# Patient Record
Sex: Female | Born: 1980 | Race: White | Hispanic: No | Marital: Married | Smoking: Current every day smoker
Health system: Southern US, Community
[De-identification: ages and names within clinical notes are randomized; demographics above are authoritative.]

## PROBLEM LIST (undated history)

## (undated) DIAGNOSIS — G932 Benign intracranial hypertension: Secondary | ICD-10-CM

## (undated) DIAGNOSIS — G8929 Other chronic pain: Secondary | ICD-10-CM

## (undated) DIAGNOSIS — E119 Type 2 diabetes mellitus without complications: Secondary | ICD-10-CM

## (undated) DIAGNOSIS — N939 Abnormal uterine and vaginal bleeding, unspecified: Secondary | ICD-10-CM

## (undated) DIAGNOSIS — N289 Disorder of kidney and ureter, unspecified: Secondary | ICD-10-CM

## (undated) HISTORY — PX: CHOLECYSTECTOMY: SHX55

## (undated) HISTORY — PX: ABDOMINAL HYSTERECTOMY: SHX81

---

## 2000-08-12 ENCOUNTER — Emergency Department (HOSPITAL_COMMUNITY): Admission: EM | Admit: 2000-08-12 | Discharge: 2000-08-12 | Payer: Self-pay

## 2000-08-12 ENCOUNTER — Encounter: Payer: Self-pay | Admitting: Emergency Medicine

## 2001-04-13 ENCOUNTER — Emergency Department (HOSPITAL_COMMUNITY): Admission: EM | Admit: 2001-04-13 | Discharge: 2001-04-13 | Payer: Self-pay | Admitting: Emergency Medicine

## 2010-08-21 ENCOUNTER — Emergency Department (HOSPITAL_BASED_OUTPATIENT_CLINIC_OR_DEPARTMENT_OTHER)
Admission: EM | Admit: 2010-08-21 | Discharge: 2010-08-21 | Payer: Self-pay | Source: Home / Self Care | Admitting: Emergency Medicine

## 2010-12-14 ENCOUNTER — Emergency Department (HOSPITAL_BASED_OUTPATIENT_CLINIC_OR_DEPARTMENT_OTHER)
Admission: EM | Admit: 2010-12-14 | Discharge: 2010-12-15 | Disposition: A | Discharge status: Home / Self Care | Payer: 59 | Attending: Emergency Medicine | Admitting: Emergency Medicine

## 2010-12-14 DIAGNOSIS — M25569 Pain in unspecified knee: Secondary | ICD-10-CM | POA: Insufficient documentation

## 2010-12-14 DIAGNOSIS — F17200 Nicotine dependence, unspecified, uncomplicated: Secondary | ICD-10-CM | POA: Insufficient documentation

## 2010-12-14 DIAGNOSIS — Y92009 Unspecified place in unspecified non-institutional (private) residence as the place of occurrence of the external cause: Secondary | ICD-10-CM | POA: Insufficient documentation

## 2010-12-14 DIAGNOSIS — W1809XA Striking against other object with subsequent fall, initial encounter: Secondary | ICD-10-CM | POA: Insufficient documentation

## 2010-12-14 DIAGNOSIS — S92919A Unspecified fracture of unspecified toe(s), initial encounter for closed fracture: Secondary | ICD-10-CM | POA: Insufficient documentation

## 2010-12-15 ENCOUNTER — Emergency Department (INDEPENDENT_AMBULATORY_CARE_PROVIDER_SITE_OTHER): Payer: 59

## 2010-12-15 DIAGNOSIS — W219XXA Striking against or struck by unspecified sports equipment, initial encounter: Secondary | ICD-10-CM

## 2010-12-15 DIAGNOSIS — M25569 Pain in unspecified knee: Secondary | ICD-10-CM

## 2010-12-15 DIAGNOSIS — M79609 Pain in unspecified limb: Secondary | ICD-10-CM

## 2010-12-15 DIAGNOSIS — W208XXA Other cause of strike by thrown, projected or falling object, initial encounter: Secondary | ICD-10-CM

## 2015-12-20 DIAGNOSIS — F329 Major depressive disorder, single episode, unspecified: Secondary | ICD-10-CM | POA: Diagnosis present

## 2015-12-20 DIAGNOSIS — F3181 Bipolar II disorder: Secondary | ICD-10-CM | POA: Diagnosis present

## 2018-05-28 ENCOUNTER — Emergency Department (HOSPITAL_BASED_OUTPATIENT_CLINIC_OR_DEPARTMENT_OTHER)
Admission: EM | Admit: 2018-05-28 | Discharge: 2018-05-29 | Disposition: A | Discharge status: Home / Self Care | Payer: Medicaid Other | Attending: Emergency Medicine | Admitting: Emergency Medicine

## 2018-05-28 ENCOUNTER — Encounter (HOSPITAL_BASED_OUTPATIENT_CLINIC_OR_DEPARTMENT_OTHER): Payer: Self-pay

## 2018-05-28 ENCOUNTER — Other Ambulatory Visit: Payer: Self-pay

## 2018-05-28 DIAGNOSIS — R110 Nausea: Secondary | ICD-10-CM | POA: Insufficient documentation

## 2018-05-28 DIAGNOSIS — F17200 Nicotine dependence, unspecified, uncomplicated: Secondary | ICD-10-CM | POA: Diagnosis not present

## 2018-05-28 DIAGNOSIS — Z7729 Contact with and (suspected ) exposure to other hazardous substances: Secondary | ICD-10-CM | POA: Insufficient documentation

## 2018-05-28 DIAGNOSIS — H538 Other visual disturbances: Secondary | ICD-10-CM | POA: Diagnosis not present

## 2018-05-28 DIAGNOSIS — R4182 Altered mental status, unspecified: Secondary | ICD-10-CM | POA: Diagnosis present

## 2018-05-28 DIAGNOSIS — R202 Paresthesia of skin: Secondary | ICD-10-CM | POA: Insufficient documentation

## 2018-05-28 LAB — RAPID URINE DRUG SCREEN, HOSP PERFORMED
AMPHETAMINES: NOT DETECTED
BENZODIAZEPINES: NOT DETECTED
Barbiturates: NOT DETECTED
COCAINE: NOT DETECTED
OPIATES: NOT DETECTED
TETRAHYDROCANNABINOL: NOT DETECTED

## 2018-05-28 LAB — URINALYSIS, ROUTINE W REFLEX MICROSCOPIC
Bilirubin Urine: NEGATIVE
Glucose, UA: NEGATIVE mg/dL
Ketones, ur: NEGATIVE mg/dL
LEUKOCYTES UA: NEGATIVE
Nitrite: NEGATIVE
PH: 7.5 (ref 5.0–8.0)
Protein, ur: NEGATIVE mg/dL
SPECIFIC GRAVITY, URINE: 1.015 (ref 1.005–1.030)

## 2018-05-28 LAB — URINALYSIS, MICROSCOPIC (REFLEX)

## 2018-05-28 LAB — PREGNANCY, URINE: Preg Test, Ur: NEGATIVE

## 2018-05-28 NOTE — ED Triage Notes (Signed)
Apparently pt touched zoanthid coral in their fish tank and did not wash her hands, ate some chips afterwards and possibly ingested the toxins, pt states she feels drunk and per her visitor she is altered

## 2018-05-28 NOTE — ED Notes (Addendum)
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

## 2018-05-28 NOTE — ED Notes (Signed)
Called poison control, Silva Bandy RN is talking to the toxicologist and will call back

## 2018-05-28 NOTE — ED Notes (Signed)
Per Silva Bandy at [employer], pt was potentially exposed to a potent marine toxin that can cause rapid cardiac and respiratory failure, anaphylaxis, vasoconstriction, ataxia, muscle weakness, v-fib, pulmonary hypertension, ischemia, fever and rhabdomyolysis, and possibly death. Pt needs to wash her hands and anywhere else that may have been exposed again with soap and water, cardiac monitoring overnight, obtain BMP and CK, verify whether the hgb in urine is not myoglobin. Provide supportive care, antihistamines for itching and steroids, avoiding benzos d/t possibility for respiratory failure. Symptoms can last hours to days. If patient remains stable until the morning, she can be discharged.

## 2018-05-29 LAB — HEPATIC FUNCTION PANEL
ALBUMIN: 4.2 g/dL (ref 3.5–5.0)
ALK PHOS: 98 U/L (ref 38–126)
ALT: 28 U/L (ref 0–44)
AST: 25 U/L (ref 15–41)
BILIRUBIN TOTAL: 0.7 mg/dL (ref 0.3–1.2)
Bilirubin, Direct: <0.1 mg/dL (ref 0.0–0.2)
Total Protein: 7.5 g/dL (ref 6.5–8.1)

## 2018-05-29 LAB — BASIC METABOLIC PANEL
Anion gap: 10 (ref 5–15)
BUN: 14 mg/dL (ref 6–20)
CALCIUM: 9.1 mg/dL (ref 8.9–10.3)
CHLORIDE: 102 mmol/L (ref 98–111)
CO2: 26 mmol/L (ref 22–32)
Creatinine, Ser: 0.88 mg/dL (ref 0.44–1.00)
GFR calc Af Amer: >60 mL/min (ref >60)
GFR calc non Af Amer: >60 mL/min (ref >60)
GLUCOSE: 111 mg/dL — AB (ref 70–99)
POTASSIUM: 3.7 mmol/L (ref 3.5–5.1)
Sodium: 138 mmol/L (ref 135–145)

## 2018-05-29 LAB — CK: Total CK: 94 U/L (ref 38–234)

## 2018-05-29 MED ORDER — IBUPROFEN 800 MG PO TABS
800.0000 mg | ORAL_TABLET | Freq: Once | ORAL | Status: AC
  Administered 2018-05-29: 800 mg via ORAL
  Filled 2018-05-29: qty 1

## 2018-05-29 NOTE — ED Notes (Signed)
Pt ambulated independently with normal gait.

## 2018-05-29 NOTE — ED Provider Notes (Signed)
[facility] EMERGENCY DEPARTMENT Provider Note   CSN: [id number] Arrival date & time: 05/28/18  2212     History   Chief Complaint Chief Complaint  Patient presents with  . Altered Mental Status    HPI Katie Spencer is a 37 y.o. female.  The history is provided by the patient.  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  She pushed the coral to the side while trying to 10 to a fish and did not wash her hands.  She ate a bag of chips at about 6:15 PM.  About 8:45 PM, she started feeling lightheaded and as if she were drunk.  She states that her body felt warm like her legs felt before going numb when she had a C-section.  There has been some vertigo and nausea but no vomiting.  Vertigo and drunk feeling are still present, but subsiding.  She denies any pain anywhere.  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  History reviewed. No pertinent past medical history.  There are no active problems to display for this patient.   Past Surgical History:  Procedure Laterality Date  . CESAREAN SECTION  2006, 2009, 2013, 2014     OB History   None      Home Medications    Prior to Admission medications   Not on File    Family History No family history on file.  Social History Social History   Tobacco Use  . Smoking status: Current Every Day Smoker    Packs/day: 1.00  . Smokeless tobacco: Never Used  Substance Use Topics  . Alcohol use: Not Currently    Frequency: Never    Comment: 9 years sober  . Drug use: Not on file     Allergies   Patient has no known allergies.   Review of Systems Review of Systems  All other systems reviewed and are negative.    Physical Exam Updated Vital Signs BP (!) 144/78 (BP Location: Left Arm)   Pulse 88   Temp 98.7 F (37.1 C) (Oral)   Resp 18   Ht 5\' 4"  (1.626 m)   Wt 99.3 kg   LMP 05/21/2018   SpO2 98%   BMI 37.59 kg/m    Physical Exam  Nursing note and vitals reviewed.  37 year old female, resting comfortably and in no acute distress. Vital signs are significant for mildly elevated systolic blood pressure. Oxygen saturation is 98%, which is normal. Head is normocephalic and atraumatic. PERRLA, EOMI. Oropharynx is clear. Neck is nontender and supple without adenopathy or JVD. Back is nontender and there is no CVA tenderness. Lungs are clear without rales, wheezes, or rhonchi. Chest is nontender. Heart has regular rate and rhythm without murmur. Abdomen is soft, flat, nontender without masses or hepatosplenomegaly and peristalsis is normoactive. Extremities have no cyanosis or edema, full range of motion is present. Skin is warm and dry without rash. Neurologic: Mental status is normal, cranial nerves are intact, there are no motor or sensory deficits.  ED Treatments / Results  Labs (all labs ordered are listed, but only abnormal results are displayed) Labs Reviewed  URINALYSIS, ROUTINE W REFLEX MICROSCOPIC - Abnormal; Notable for the following components:      Result Value   APPearance CLOUDY (*)    Hgb urine dipstick TRACE (*)    All other components within normal limits  URINALYSIS,  MICROSCOPIC (REFLEX) - Abnormal; Notable for the following components:   Bacteria, UA FEW (*)    All other components within normal limits  PREGNANCY, URINE  RAPID URINE DRUG SCREEN, HOSP PERFORMED  BASIC METABOLIC PANEL  CK    EKG EKG Interpretation  Date/Time:  Tuesday May 28 2018 22:30:51 EDT Ventricular Rate:  76 PR Interval:    QRS Duration: 104 QT Interval:  378 QTC Calculation: 425 R Axis:   57 Text Interpretation:  Sinus rhythm No previous tracing Confirmed by Gwyneth Sprout ([id number]) on 05/28/2018 10:36:24 PM  Procedures Procedures  CRITICAL CARE Performed by: Dione Booze Total critical care time: 60 minutes Critical care time was exclusive of separately billable procedures and  treating other patients. Critical care was necessary to treat or prevent imminent or life-threatening deterioration. Critical care was time spent personally by me on the following activities: development of treatment plan with patient and/or surrogate as well as nursing, discussions with consultants, evaluation of patient's response to treatment, examination of patient, obtaining history from patient or surrogate, ordering and performing treatments and interventions, ordering and review of laboratory studies, ordering and review of radiographic studies, pulse oximetry and re-evaluation of patient's condition.  Medications Ordered in ED Medications - No data to display   Initial Impression / Assessment and Plan / ED Course  I have reviewed the triage vital signs and the nursing notes.  Pertinent labs & imaging results that were available during my care of the patient were reviewed by me and considered in my medical decision making (see chart for details).  Possible exposure to zoanthid coral toxin.  Per poison control, patient needs to be watched closely overnight with a supportive care.  She currently does not have any itching, and so has not given any antihistamines or steroids.  She is awake and alert and nontoxic in appearance.  There was concern for possible rhabdomyolysis because urinalysis did have trace hemoglobin, but CK is normal.  5:51 AM Patient has been observed overnight in the ED.  She has been hemodynamically stable.  No longer complaining of dizziness.  Will ambulate and steady on her feet, she should be safe for discharge.  6:08 AM She ambulated without difficulty.  She is felt to be safe for discharge.  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  Final Clinical Impressions(s) / ED Diagnoses   Final diagnoses:  Exposure to toxin    ED Discharge Orders    None       Dione Booze, MD 05/29/18 [phone]

## 2018-05-29 NOTE — ED Notes (Addendum)
Pt a/o x 4- speaking in complete sentences. Neuro intact. Pt up to sink in treatment room to wash hands. Gait unsteady. Reports feeling "dizzy".

## 2018-05-29 NOTE — Discharge Instructions (Addendum)
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

## 2020-01-04 ENCOUNTER — Other Ambulatory Visit: Payer: Self-pay

## 2020-01-04 ENCOUNTER — Emergency Department (HOSPITAL_BASED_OUTPATIENT_CLINIC_OR_DEPARTMENT_OTHER)
Admission: EM | Admit: 2020-01-04 | Discharge: 2020-01-04 | Disposition: A | Discharge status: Home / Self Care | Payer: Medicaid Other | Attending: Emergency Medicine | Admitting: Emergency Medicine

## 2020-01-04 ENCOUNTER — Encounter (HOSPITAL_BASED_OUTPATIENT_CLINIC_OR_DEPARTMENT_OTHER): Payer: Self-pay | Admitting: Emergency Medicine

## 2020-01-04 DIAGNOSIS — F17210 Nicotine dependence, cigarettes, uncomplicated: Secondary | ICD-10-CM | POA: Insufficient documentation

## 2020-01-04 DIAGNOSIS — N939 Abnormal uterine and vaginal bleeding, unspecified: Secondary | ICD-10-CM | POA: Insufficient documentation

## 2020-01-04 DIAGNOSIS — Z79899 Other long term (current) drug therapy: Secondary | ICD-10-CM | POA: Diagnosis not present

## 2020-01-04 DIAGNOSIS — R109 Unspecified abdominal pain: Secondary | ICD-10-CM | POA: Diagnosis not present

## 2020-01-04 LAB — CBC WITH DIFFERENTIAL/PLATELET
Abs Immature Granulocytes: 0.03 10*3/uL (ref 0.00–0.07)
Basophils Absolute: 0.1 10*3/uL (ref 0.0–0.1)
Basophils Relative: 1 %
Eosinophils Absolute: 0.2 10*3/uL (ref 0.0–0.5)
Eosinophils Relative: 3 %
HCT: 31.4 % — ABNORMAL LOW (ref 36.0–46.0)
Hemoglobin: 11.1 g/dL — ABNORMAL LOW (ref 12.0–15.0)
Immature Granulocytes: 0 %
Lymphocytes Relative: 32 %
Lymphs Abs: 2.9 10*3/uL (ref 0.7–4.0)
MCH: 32.5 pg (ref 26.0–34.0)
MCHC: 35.4 g/dL (ref 30.0–36.0)
MCV: 91.8 fL (ref 80.0–100.0)
Monocytes Absolute: 0.5 10*3/uL (ref 0.1–1.0)
Monocytes Relative: 6 %
Neutro Abs: 5.3 10*3/uL (ref 1.7–7.7)
Neutrophils Relative %: 58 %
Platelets: 225 10*3/uL (ref 150–400)
RBC: 3.42 MIL/uL — ABNORMAL LOW (ref 3.87–5.11)
RDW: 12.1 % (ref 11.5–15.5)
WBC: 9 10*3/uL (ref 4.0–10.5)
nRBC: 0 % (ref 0.0–0.2)

## 2020-01-04 LAB — COMPREHENSIVE METABOLIC PANEL
ALT: 24 U/L (ref 0–44)
AST: 26 U/L (ref 15–41)
Albumin: 3.9 g/dL (ref 3.5–5.0)
Alkaline Phosphatase: 67 U/L (ref 38–126)
Anion gap: 8 (ref 5–15)
BUN: 11 mg/dL (ref 6–20)
CO2: 24 mmol/L (ref 22–32)
Calcium: 9 mg/dL (ref 8.9–10.3)
Chloride: 104 mmol/L (ref 98–111)
Creatinine, Ser: 0.83 mg/dL (ref 0.44–1.00)
GFR calc Af Amer: >60 mL/min (ref >60)
GFR calc non Af Amer: >60 mL/min (ref >60)
Glucose, Bld: 126 mg/dL — ABNORMAL HIGH (ref 70–99)
Potassium: 3.4 mmol/L — ABNORMAL LOW (ref 3.5–5.1)
Sodium: 136 mmol/L (ref 135–145)
Total Bilirubin: 0.5 mg/dL (ref 0.3–1.2)
Total Protein: 6.6 g/dL (ref 6.5–8.1)

## 2020-01-04 MED ORDER — HYDROCODONE-ACETAMINOPHEN 5-325 MG PO TABS
1.0000 | ORAL_TABLET | Freq: Once | ORAL | Status: AC
  Administered 2020-01-04: 1 via ORAL
  Filled 2020-01-04: qty 1

## 2020-01-04 NOTE — Discharge Instructions (Signed)
Continue on the TXA, follow up with your GYN tomorrow.

## 2020-01-04 NOTE — ED Triage Notes (Signed)
Vaginal bleeding x 4 weeks. Has seen gynecology and was seen at [place] [facility]. States she she soaking a tampon per hour

## 2020-01-04 NOTE — ED Provider Notes (Signed)
[facility] EMERGENCY DEPARTMENT Provider Note   CSN: [id number] Arrival date & time: 01/04/20  1221     History Chief Complaint  Patient presents with  . Vaginal Bleeding    Katie Spencer is a 39 y.o. female.  41-year-old female presents to ER with ongoing heavy vaginal bleeding.  Patient states that she had a menstrual cycle in January, did not have a February cycle and then began bleeding again on March 24 and has been bleeding ever since.  Patient states a few days into her cycles when the cycle became heavy, is associated with clots.  Patient states that she is currently using one super tampon every 30 to 40 minutes.  Patient was seen at [facility] about 2 weeks ago, had a work-up and was diagnosed with an anovulatory menstrual cycle, found to have a simple 3.5 cm right cyst as well as an endometrial stripe of 10.9 mm.  Patient followed up with her gynecologist, Dr. Rito Ehrlich with pain [facility] in [facility] who started her on Provera, this was not helping and patient was started on TXA 3 days ago, states bleeding has been heavier since starting the TXA.  Patient denies bleeding gums, easy bruising, prior clotting disorder.  Patient reports mild abdominal discomfort as well as generalized weakness.  No prior blood transfusions, not opposed to emergency blood transfusion if needed.        History reviewed. No pertinent past medical history.  There are no problems to display for this patient.   Past Surgical History:  Procedure Laterality Date  . CESAREAN SECTION  2006, 2009, 2013, 2014     OB History   No obstetric history on file.     No family history on file.  Social History   Tobacco Use  . Smoking status: Current Every Day Smoker    Packs/day: 1.00  . Smokeless tobacco: Never Used  Substance Use Topics  . 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 Drug use: Not on file    Home Medications Prior to Admission medications    Medication Sig Start Date End Date Taking? Authorizing Provider  naproxen (NAPROSYN) 500 MG tablet Take by mouth. 12/25/19  Yes [provider]  phentermine (ADIPEX-P) 37.5 MG tablet Take by mouth. 12/25/19 01/24/20 Yes [provider]  citalopram (CELEXA) 40 MG tablet Take 40 mg by mouth daily. 01/01/20   [provider]  hydrOXYzine (ATARAX/VISTARIL) 10 MG tablet Take 10 mg by mouth 3 (three) times daily as needed. 01/01/20   [provider]  tranexamic acid (LYSTEDA) 650 MG TABS tablet Take 1,300 mg by mouth 3 (three) times daily. 01/01/20   [provider]  VRAYLAR 6 MG CAPS Take 1 capsule by mouth daily. 01/02/20   [provider]    Allergies    Patient has no known allergies.  Review of Systems   Review of Systems  Constitutional: Negative for fever.  Respiratory: Negative for shortness of breath.   Cardiovascular: Negative for chest pain.  Gastrointestinal: Positive for abdominal pain. Negative for constipation, diarrhea, nausea and vomiting.  Genitourinary: Positive for vaginal bleeding.  Musculoskeletal: Negative for arthralgias and myalgias.  Skin: Negative for color change, rash and wound.  Allergic/Immunologic: Negative for immunocompromised state.  Neurological: Positive for weakness.  All other systems reviewed and are negative.   Physical Exam Updated Vital Signs BP (!) 115/52 (BP Location: Left Arm)   Pulse 86   Temp 98.3 F (  36.8 C) (Oral)   Resp 18   Ht 5\' 4"  (1.626 m)   Wt 99.3 kg   SpO2 98%   BMI 37.59 kg/m   Physical Exam Vitals and nursing note reviewed. Exam conducted with a chaperone present.  Constitutional:      General: She is not in acute distress.    Appearance: She is well-developed. She is not diaphoretic.  HENT:     Head: Normocephalic and atraumatic.  Cardiovascular:     Rate and Rhythm: Normal rate and regular rhythm.     Pulses: Normal pulses.     Heart sounds: Normal heart sounds.    Pulmonary:     Effort: Pulmonary effort is normal.     Breath sounds: Normal breath sounds.  Abdominal:     Palpations: Abdomen is soft.     Tenderness: There is abdominal tenderness in the right lower quadrant, suprapubic area and left lower quadrant. There is no right CVA tenderness or left CVA tenderness.  Genitourinary:    Comments: Small amount of blood in the vagina, no heavy bleeding or hemorrhage present. Musculoskeletal:     Right lower leg: No edema.     Left lower leg: No edema.  Skin:    General: Skin is warm and dry.     Coloration: Skin is not pale.     Findings: No erythema or rash.  Neurological:     Mental Status: She is alert and oriented to person, place, and time.  Psychiatric:        Behavior: Behavior normal.     ED Results / Procedures / Treatments   Labs (all labs ordered are listed, but only abnormal results are displayed) Labs Reviewed  COMPREHENSIVE METABOLIC PANEL - Abnormal; Notable for the following components:      Result Value   Potassium 3.4 (*)    Glucose, Bld 126 (*)    All other components within normal limits  CBC WITH DIFFERENTIAL/PLATELET - Abnormal; Notable for the following components:   RBC 3.42 (*)    Hemoglobin 11.1 (*)    HCT 31.4 (*)    All other components within normal limits    EKG None  Radiology No results found.  Procedures Procedures (including critical care time)  Medications Ordered in ED Medications  HYDROcodone-acetaminophen (NORCO/VICODIN) 5-325 MG per tablet 1 tablet (1 tablet Oral Given 01/04/20 1406)    ED Course  I have reviewed the triage vital signs and the nursing notes.  Pertinent labs & imaging results that were available during my care of the patient were reviewed by me and considered in my medical decision making (see chart for details).  Clinical Course as of Jan 03 1449  Sun Jan 04, 2020  2752 39 year old female presents with ongoing vaginal bleeding.  Patient states she started her  menstrual cycle on March 24 and has had constant heavy bleeding with clots since that time.  The patient had a ER work-up on April 8, was found to have a thickened endometrial stripe, had hemoglobin of 13.3 hematocrit of 38.0.  Patient followed up with [facility], has been on Provera, currently taking TXA 3 times daily and feels that she is having heavier bleeding with this medication.  Patient states soaking through a super tampon every 30 to 40 minutes.  On arrival, vitals are unremarkable, patient is well-appearing, she is not pale, she has very mild lower abdominal tenderness, on pelvic exam she has small amount of blood in the vagina without  hemorrhage or excessive bleeding noted through observation.  Review of labs, patient's hemoglobin is 11.1 today with hematocrit of 31.4. Case was discussed with Dr. Nash Mantis, on-call for [facility], recommends patient continue on the TXA and contact [facility] tomorrow morning for follow-up.   [LM]    Clinical Course User Index [LM] Roque Lias   MDM Rules/Calculators/A&P                      Final Clinical Impression(s) / ED Diagnoses Final diagnoses:  Vaginal bleeding    Rx / DC Orders ED Discharge Orders    None       Tacy Learn, PA-C 01/04/20 1450    Lennice Sites, DO 01/07/20 1723

## 2020-02-01 ENCOUNTER — Other Ambulatory Visit: Payer: Self-pay

## 2020-02-01 ENCOUNTER — Emergency Department (HOSPITAL_BASED_OUTPATIENT_CLINIC_OR_DEPARTMENT_OTHER)
Admission: EM | Admit: 2020-02-01 | Discharge: 2020-02-01 | Disposition: A | Discharge status: Home / Self Care | Payer: Medicaid Other | Attending: Emergency Medicine | Admitting: Emergency Medicine

## 2020-02-01 ENCOUNTER — Encounter (HOSPITAL_BASED_OUTPATIENT_CLINIC_OR_DEPARTMENT_OTHER): Payer: Self-pay | Admitting: Emergency Medicine

## 2020-02-01 DIAGNOSIS — F17210 Nicotine dependence, cigarettes, uncomplicated: Secondary | ICD-10-CM | POA: Diagnosis not present

## 2020-02-01 DIAGNOSIS — R102 Pelvic and perineal pain: Secondary | ICD-10-CM | POA: Diagnosis not present

## 2020-02-01 DIAGNOSIS — Z79899 Other long term (current) drug therapy: Secondary | ICD-10-CM | POA: Insufficient documentation

## 2020-02-01 DIAGNOSIS — N939 Abnormal uterine and vaginal bleeding, unspecified: Secondary | ICD-10-CM | POA: Diagnosis not present

## 2020-02-01 HISTORY — DX: Abnormal uterine and vaginal bleeding, unspecified: N93.9

## 2020-02-01 LAB — CBC WITH DIFFERENTIAL/PLATELET
Abs Immature Granulocytes: 0.04 10*3/uL (ref 0.00–0.07)
Basophils Absolute: 0.1 10*3/uL (ref 0.0–0.1)
Basophils Relative: 1 %
Eosinophils Absolute: 0.2 10*3/uL (ref 0.0–0.5)
Eosinophils Relative: 2 %
HCT: 38.3 % (ref 36.0–46.0)
Hemoglobin: 13.5 g/dL (ref 12.0–15.0)
Immature Granulocytes: 0 %
Lymphocytes Relative: 35 %
Lymphs Abs: 3.4 10*3/uL (ref 0.7–4.0)
MCH: 32.2 pg (ref 26.0–34.0)
MCHC: 35.2 g/dL (ref 30.0–36.0)
MCV: 91.4 fL (ref 80.0–100.0)
Monocytes Absolute: 0.6 10*3/uL (ref 0.1–1.0)
Monocytes Relative: 6 %
Neutro Abs: 5.3 10*3/uL (ref 1.7–7.7)
Neutrophils Relative %: 56 %
Platelets: 259 10*3/uL (ref 150–400)
RBC: 4.19 MIL/uL (ref 3.87–5.11)
RDW: 11.9 % (ref 11.5–15.5)
WBC: 9.5 10*3/uL (ref 4.0–10.5)
nRBC: 0 % (ref 0.0–0.2)

## 2020-02-01 LAB — URINALYSIS, MICROSCOPIC (REFLEX)

## 2020-02-01 LAB — URINALYSIS, ROUTINE W REFLEX MICROSCOPIC
Bilirubin Urine: NEGATIVE
Glucose, UA: 100 mg/dL — AB
Ketones, ur: NEGATIVE mg/dL
Leukocytes,Ua: NEGATIVE
Nitrite: NEGATIVE
Protein, ur: NEGATIVE mg/dL
Specific Gravity, Urine: >1.03 — ABNORMAL HIGH (ref 1.005–1.030)
pH: 6 (ref 5.0–8.0)

## 2020-02-01 LAB — PREGNANCY, URINE: Preg Test, Ur: NEGATIVE

## 2020-02-01 MED ORDER — ONDANSETRON HCL 4 MG/2ML IJ SOLN
4.0000 mg | Freq: Once | INTRAMUSCULAR | Status: AC
  Administered 2020-02-01: 4 mg via INTRAVENOUS
  Filled 2020-02-01: qty 2

## 2020-02-01 MED ORDER — MORPHINE SULFATE (PF) 4 MG/ML IV SOLN
4.0000 mg | Freq: Once | INTRAVENOUS | Status: AC
  Administered 2020-02-01: 4 mg via INTRAVENOUS
  Filled 2020-02-01: qty 1

## 2020-02-01 MED ORDER — DIAZEPAM 2 MG PO TABS
2.0000 mg | ORAL_TABLET | Freq: Once | ORAL | Status: AC
  Administered 2020-02-01: 2 mg via ORAL
  Filled 2020-02-01: qty 1

## 2020-02-01 MED ORDER — FENTANYL CITRATE (PF) 100 MCG/2ML IJ SOLN
50.0000 ug | Freq: Once | INTRAMUSCULAR | Status: AC
  Administered 2020-02-01: 50 ug via INTRAVENOUS
  Filled 2020-02-01: qty 2

## 2020-02-01 MED ORDER — NAPROXEN 250 MG PO TABS
ORAL_TABLET | ORAL | Status: AC
  Filled 2020-02-01: qty 2

## 2020-02-01 MED ORDER — NAPROXEN 250 MG PO TABS
500.0000 mg | ORAL_TABLET | Freq: Once | ORAL | Status: AC
  Administered 2020-02-01: 500 mg via ORAL

## 2020-02-01 MED ORDER — OXYCODONE-ACETAMINOPHEN 5-325 MG PO TABS
1.0000 | ORAL_TABLET | Freq: Once | ORAL | Status: AC
  Administered 2020-02-01: 1 via ORAL
  Filled 2020-02-01: qty 1

## 2020-02-01 MED ORDER — ACETAMINOPHEN 325 MG PO TABS
325.0000 mg | ORAL_TABLET | Freq: Once | ORAL | Status: AC
  Administered 2020-02-01: 325 mg via ORAL
  Filled 2020-02-01: qty 1

## 2020-02-01 MED ORDER — KETOROLAC TROMETHAMINE 15 MG/ML IJ SOLN
15.0000 mg | Freq: Once | INTRAMUSCULAR | Status: AC
  Administered 2020-02-01: 15 mg via INTRAVENOUS
  Filled 2020-02-01: qty 1

## 2020-02-01 MED ORDER — HYDROMORPHONE HCL 1 MG/ML IJ SOLN
0.5000 mg | Freq: Once | INTRAMUSCULAR | Status: AC
  Administered 2020-02-01: 0.5 mg via INTRAVENOUS
  Filled 2020-02-01: qty 1

## 2020-02-01 NOTE — Discharge Instructions (Signed)
Recommend taking the Provera that was previously prescribed by your gynecologist.  Tomorrow morning, recommend that you call his office to notify him of your ongoing symptoms and try to get close follow-up appointment, ideally to be seen within the next day or two.  If your pain significantly worsens, bleeding worsens, or you develop other new concerning symptom, return to ER for reassessment.

## 2020-02-01 NOTE — ED Notes (Signed)
Pt discharged to home. Discharge instructions have been discussed with patient and/or family members. Pt verbally acknowledges understanding d/c instructions, and endorses comprehension to checkout at registration before leaving.  °

## 2020-02-01 NOTE — ED Notes (Signed)
ED Provider at bedside. 

## 2020-02-01 NOTE — ED Triage Notes (Signed)
Pt c/o abdominal cramping. Pt has had issues since March.  Pt completed birth control pills as directed on Tuesday, began having vaginal bleed onset Thursday.

## 2020-02-01 NOTE — ED Provider Notes (Signed)
[facility] EMERGENCY DEPARTMENT Provider Note   CSN: [id number] Arrival date & time: 02/01/20  1424     History No chief complaint on file.    Katie Spencer is a 39 y.o. female.  Presents to the emergency room with chief complaint of pelvic pain, vaginal bleeding.  Over the past few months patient has been having issues with abnormal uterine bleeding, followed closely by OB/GYN.  Dr. Rito Ehrlich.  Patient has previously been on Provera, TXA.  Reports that she was seen in the clinic last week and at that time was doing better from a bleeding and pain standpoint, she was prescribed another course of Provera however had not started this medication.  She states over the past couple days she started having some relatively mild to moderate bleeding, not nearly as heavy as other episodes, however has been having significant lower pelvic pain, describes it as sharp, stabbing, cramping sensation.  Similar to prior episodes.  No fever, no vomiting.  No generalized abdominal pain.  Had been taking some Tylenol and Motrin at home without any significant relief.  Review chart, recent OB visit with Dr. Rito Ehrlich, recent ER visits. HPI     Past Medical History:  Diagnosis Date  . Abnormal vaginal bleeding     There are no problems to display for this patient.   Past Surgical History:  Procedure Laterality Date  . CESAREAN SECTION  2006, 2009, 2013, 2014     OB History   No obstetric history on file.     No family history on file.  Social History   Tobacco Use  . Smoking status: Current Every Day Smoker    Packs/day: 1.00  . Smokeless tobacco: Never Used  Substance Use Topics  . 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 Drug use: Not on file    Home Medications Prior to Admission medications   Medication Sig Start Date End Date Taking? Authorizing Provider  citalopram (CELEXA) 40 MG tablet Take 40 mg by mouth daily. 01/01/20   [provider]    hydrOXYzine (ATARAX/VISTARIL) 10 MG tablet Take 10 mg by mouth 3 (three) times daily as needed. 01/01/20   [provider]  naproxen (NAPROSYN) 500 MG tablet Take by mouth. 12/25/19   [provider]  phentermine (ADIPEX-P) 37.5 MG tablet Take by mouth. 12/25/19 01/24/20  [provider]  tranexamic acid (LYSTEDA) 650 MG TABS tablet Take 1,300 mg by mouth 3 (three) times daily. 01/01/20   [provider]  VRAYLAR 6 MG CAPS Take 1 capsule by mouth daily. 01/02/20   [provider]    Allergies    Patient has no known allergies.  Review of Systems   Review of Systems  Constitutional: Negative for chills and fever.  HENT: Negative for ear pain and sore throat.   Eyes: Negative for pain and visual disturbance.  Respiratory: Negative for cough and shortness of breath.   Cardiovascular: Negative for chest pain and palpitations.  Gastrointestinal: Negative for abdominal pain and vomiting.  Genitourinary: Positive for pelvic pain and vaginal bleeding. Negative for dysuria and hematuria.  Musculoskeletal: Negative for arthralgias and back pain.  Skin: Negative for color change and rash.  Neurological: Negative for seizures and syncope.  All other systems reviewed and are negative.   Physical Exam Updated Vital Signs BP 134/79 (BP Location: Left Arm)   Pulse 66   Temp 98.5 F (36.9 C) (Oral)   Resp 14   Ht  5\' 4"  (1.626 m)   Wt 99.3 kg   LMP 01/29/2020   SpO2 100%   BMI 37.59 kg/m   Physical Exam Vitals and nursing note reviewed.  Constitutional:      General: She is not in acute distress.    Appearance: She is well-developed.  HENT:     Head: Normocephalic and atraumatic.  Eyes:     Conjunctiva/sclera: Conjunctivae normal.  Cardiovascular:     Rate and Rhythm: Normal rate and regular rhythm.     Heart sounds: No murmur.  Pulmonary:     Effort: Pulmonary effort is normal. No respiratory distress.     Breath sounds: Normal breath sounds.   Abdominal:     Palpations: Abdomen is soft.     Tenderness: There is no abdominal tenderness.  Musculoskeletal:        General: No deformity or signs of injury.     Cervical back: Neck supple.  Skin:    General: Skin is warm and dry.     Capillary Refill: Capillary refill takes less than 2 seconds.  Neurological:     General: No focal deficit present.     Mental Status: She is alert and oriented to person, place, and time.  Psychiatric:        Mood and Affect: Mood normal.        Behavior: Behavior normal.     ED Results / Procedures / Treatments   Labs (all labs ordered are listed, but only abnormal results are displayed) Labs Reviewed  URINALYSIS, ROUTINE W REFLEX MICROSCOPIC - Abnormal; Notable for the following components:      Result Value   Specific Gravity, Urine >1.030 (*)    Glucose, UA 100 (*)    Hgb urine dipstick LARGE (*)    All other components within normal limits  URINALYSIS, MICROSCOPIC (REFLEX) - Abnormal; Notable for the following components:   Bacteria, UA MANY (*)    All other components within normal limits  PREGNANCY, URINE  CBC WITH DIFFERENTIAL/PLATELET    EKG None  Radiology No results found.  Procedures Procedures (including critical care time)  Medications Ordered in ED Medications  naproxen (NAPROSYN) 250 MG tablet (has no administration in time range)  ketorolac (TORADOL) 15 MG/ML injection 15 mg (15 mg Intravenous Given 02/01/20 1639)  HYDROmorphone (DILAUDID) injection 0.5 mg (0.5 mg Intravenous Given 02/01/20 1639)  morphine 4 MG/ML injection 4 mg (4 mg Intravenous Given 02/01/20 1733)  ondansetron (ZOFRAN) injection 4 mg (4 mg Intravenous Given 02/01/20 1730)  diazepam (VALIUM) tablet 2 mg (2 mg Oral Given 02/01/20 1936)  fentaNYL (SUBLIMAZE) injection 50 mcg (50 mcg Intravenous Given 02/01/20 1936)  acetaminophen (TYLENOL) tablet 325 mg (325 mg Oral Given 02/01/20 2059)  oxyCODONE-acetaminophen (PERCOCET/ROXICET) 5-325 MG per tablet  1 tablet (1 tablet Oral Given 02/01/20 2059)  naproxen (NAPROSYN) tablet 500 mg (500 mg Oral Given 02/01/20 2240)    ED Course  I have reviewed the triage vital signs and the nursing notes.  Pertinent labs & imaging results that were available during my care of the patient were reviewed by me and considered in my medical decision making (see chart for details).    MDM Rules/Calculators/A&P                     39 year old lady who presents to ER with concern for pelvic pain, vaginal bleeding.  History of abnormal uterine bleeding, followed by Dr. 24 with OB/GYN.  Today patient reports that she is  having recurrent bleeding albeit not as severe as past bleeding as well as recurring pelvic pain and cramping.  Suspect symptoms are related to her same issues that she has been struggling with over the past couple months.  Her abdomen is soft, vital signs are normal, low suspicion for new acute abdominal pelvic process.  Hemoglobin today was 13, actually improved from past visits.  Provided multiple doses of pain control and eventually did have good control of her patient's pain.  Discussed case with Dr. Micah Noel on-call for Dr.O'Keefe who recommended that patient just have close follow-up appointment and discuss any further medication changes with primary gynecologist.  Patient was agreeable, discharged home, recommend return for worsening bleeding/pain.    After the discussed management above, the patient was determined to be safe for discharge.  The patient was in agreement with this plan and all questions regarding their care were answered.  ED return precautions were discussed and the patient will return to the ED with any significant worsening of condition.    Final Clinical Impression(s) / ED Diagnoses Final diagnoses:  Abnormal uterine bleeding    Rx / DC Orders ED Discharge Orders    None       Lucrezia Starch, MD 02/01/20 2313

## 2020-02-09 ENCOUNTER — Encounter (HOSPITAL_BASED_OUTPATIENT_CLINIC_OR_DEPARTMENT_OTHER): Payer: Self-pay | Admitting: Emergency Medicine

## 2020-02-09 ENCOUNTER — Emergency Department (HOSPITAL_BASED_OUTPATIENT_CLINIC_OR_DEPARTMENT_OTHER)
Admission: EM | Admit: 2020-02-09 | Discharge: 2020-02-09 | Disposition: A | Discharge status: Home / Self Care | Payer: Medicaid Other | Attending: Emergency Medicine | Admitting: Emergency Medicine

## 2020-02-09 ENCOUNTER — Emergency Department (HOSPITAL_BASED_OUTPATIENT_CLINIC_OR_DEPARTMENT_OTHER): Payer: Medicaid Other

## 2020-02-09 ENCOUNTER — Other Ambulatory Visit: Payer: Self-pay

## 2020-02-09 DIAGNOSIS — R102 Pelvic and perineal pain: Secondary | ICD-10-CM

## 2020-02-09 DIAGNOSIS — F17210 Nicotine dependence, cigarettes, uncomplicated: Secondary | ICD-10-CM | POA: Insufficient documentation

## 2020-02-09 DIAGNOSIS — Z79899 Other long term (current) drug therapy: Secondary | ICD-10-CM | POA: Diagnosis not present

## 2020-02-09 DIAGNOSIS — R109 Unspecified abdominal pain: Secondary | ICD-10-CM | POA: Diagnosis present

## 2020-02-09 LAB — COMPREHENSIVE METABOLIC PANEL
ALT: 45 U/L — ABNORMAL HIGH (ref 0–44)
AST: 40 U/L (ref 15–41)
Albumin: 4 g/dL (ref 3.5–5.0)
Alkaline Phosphatase: 78 U/L (ref 38–126)
Anion gap: 12 (ref 5–15)
BUN: 13 mg/dL (ref 6–20)
CO2: 24 mmol/L (ref 22–32)
Calcium: 8.7 mg/dL — ABNORMAL LOW (ref 8.9–10.3)
Chloride: 101 mmol/L (ref 98–111)
Creatinine, Ser: 0.85 mg/dL (ref 0.44–1.00)
GFR calc Af Amer: >60 mL/min (ref >60)
GFR calc non Af Amer: >60 mL/min (ref >60)
Glucose, Bld: 112 mg/dL — ABNORMAL HIGH (ref 70–99)
Potassium: 4.1 mmol/L (ref 3.5–5.1)
Sodium: 137 mmol/L (ref 135–145)
Total Bilirubin: 0.6 mg/dL (ref 0.3–1.2)
Total Protein: 7 g/dL (ref 6.5–8.1)

## 2020-02-09 LAB — CBC WITH DIFFERENTIAL/PLATELET
Abs Immature Granulocytes: 0.03 10*3/uL (ref 0.00–0.07)
Basophils Absolute: 0.1 10*3/uL (ref 0.0–0.1)
Basophils Relative: 1 %
Eosinophils Absolute: 0.3 10*3/uL (ref 0.0–0.5)
Eosinophils Relative: 3 %
HCT: 38.8 % (ref 36.0–46.0)
Hemoglobin: 13.5 g/dL (ref 12.0–15.0)
Immature Granulocytes: 0 %
Lymphocytes Relative: 36 %
Lymphs Abs: 3.4 10*3/uL (ref 0.7–4.0)
MCH: 31.6 pg (ref 26.0–34.0)
MCHC: 34.8 g/dL (ref 30.0–36.0)
MCV: 90.9 fL (ref 80.0–100.0)
Monocytes Absolute: 0.6 10*3/uL (ref 0.1–1.0)
Monocytes Relative: 7 %
Neutro Abs: 5.1 10*3/uL (ref 1.7–7.7)
Neutrophils Relative %: 53 %
Platelets: 243 10*3/uL (ref 150–400)
RBC: 4.27 MIL/uL (ref 3.87–5.11)
RDW: 11.8 % (ref 11.5–15.5)
WBC: 9.5 10*3/uL (ref 4.0–10.5)
nRBC: 0 % (ref 0.0–0.2)

## 2020-02-09 LAB — URINALYSIS, ROUTINE W REFLEX MICROSCOPIC
Bilirubin Urine: NEGATIVE
Glucose, UA: NEGATIVE mg/dL
Hgb urine dipstick: NEGATIVE
Ketones, ur: NEGATIVE mg/dL
Nitrite: NEGATIVE
Protein, ur: NEGATIVE mg/dL
Specific Gravity, Urine: 1.02 (ref 1.005–1.030)
pH: 7 (ref 5.0–8.0)

## 2020-02-09 LAB — URINALYSIS, MICROSCOPIC (REFLEX)

## 2020-02-09 LAB — PREGNANCY, URINE: Preg Test, Ur: NEGATIVE

## 2020-02-09 LAB — LIPASE, BLOOD: Lipase: 28 U/L (ref 11–51)

## 2020-02-09 MED ORDER — SODIUM CHLORIDE 0.9 % IV BOLUS
1000.0000 mL | Freq: Once | INTRAVENOUS | Status: AC
  Administered 2020-02-09: 1000 mL via INTRAVENOUS

## 2020-02-09 MED ORDER — ONDANSETRON HCL 4 MG/2ML IJ SOLN
4.0000 mg | Freq: Once | INTRAMUSCULAR | Status: AC
  Administered 2020-02-09: 4 mg via INTRAVENOUS
  Filled 2020-02-09: qty 2

## 2020-02-09 MED ORDER — IOHEXOL 300 MG/ML  SOLN
100.0000 mL | Freq: Once | INTRAMUSCULAR | Status: AC | PRN
  Administered 2020-02-09: 100 mL via INTRAVENOUS

## 2020-02-09 MED ORDER — MORPHINE SULFATE (PF) 4 MG/ML IV SOLN
4.0000 mg | Freq: Once | INTRAVENOUS | Status: AC
  Administered 2020-02-09: 4 mg via INTRAVENOUS
  Filled 2020-02-09: qty 1

## 2020-02-09 MED ORDER — KETOROLAC TROMETHAMINE 15 MG/ML IJ SOLN
15.0000 mg | Freq: Once | INTRAMUSCULAR | Status: AC
  Administered 2020-02-09: 15 mg via INTRAVENOUS
  Filled 2020-02-09: qty 1

## 2020-02-09 NOTE — ED Provider Notes (Signed)
[facility] EMERGENCY DEPARTMENT Provider Note   CSN: [id number] Arrival date & time: 02/09/20  5701     History Chief Complaint  Patient presents with  . Abdominal Pain    Katie Spencer is a 39 y.o. female.  HPI     This is a 39 year old female with recent history of abnormal vaginal bleeding who presents with pelvic pain.  Patient reports she has had recurrent pelvic pain for the last several months.  She states that the episodes of pain started when she bled for 6 weeks in February.  It took multiple interventions to stop the bleeding.  She states that she develops crampy sharp pain over the lower abdomen.  Currently she rates her pain at 12 out of 10.  She took an oxycodone with no relief.  She denies any nausea, vomiting, diarrhea, constipation.  She is not currently bleeding vaginally.  She denies dysuria or hematuria.  She denies vaginal discharge or concerns for STDs.  She states that she was scheduled for a D&C but had to reschedule for July.  Denies recent fevers or upper respiratory symptoms.  Patient chart reviewed.  Multiple documented visits OB/GYN with exam.  STD testing as recent as April 8 with negative GC and chlamydia.  Patient essentially failed medical management for dysfunctional uterine bleeding and menorrhagia.   Past Medical History:  Diagnosis Date  . Abnormal vaginal bleeding     There are no problems to display for this patient.   Past Surgical History:  Procedure Laterality Date  . CESAREAN SECTION  2006, 2009, 2013, 2014     OB History   No obstetric history on file.     No family history on file.  Social History   Tobacco Use  . Smoking status: Current Every Day Smoker    Packs/day: 1.00  . Smokeless tobacco: Never Used  Substance Use Topics  . 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 Drug use: Not on file    Home Medications Prior to Admission medications   Medication Sig Start Date End Date  Taking? Authorizing Provider  citalopram (CELEXA) 40 MG tablet Take 40 mg by mouth daily. 01/01/20   [provider]  hydrOXYzine (ATARAX/VISTARIL) 10 MG tablet Take 10 mg by mouth 3 (three) times daily as needed. 01/01/20   [provider]  naproxen (NAPROSYN) 500 MG tablet Take by mouth. 12/25/19   [provider]  phentermine (ADIPEX-P) 37.5 MG tablet Take by mouth. 12/25/19 01/24/20  [provider]  tranexamic acid (LYSTEDA) 650 MG TABS tablet Take 1,300 mg by mouth 3 (three) times daily. 01/01/20   [provider]  VRAYLAR 6 MG CAPS Take 1 capsule by mouth daily. 01/02/20   [provider]    Allergies    Patient has no known allergies.  Review of Systems   Review of Systems  Constitutional: Negative for fever.  Respiratory: Negative for shortness of breath.   Cardiovascular: Negative for chest pain.  Gastrointestinal: Positive for abdominal pain. Negative for nausea and vomiting.  Genitourinary: Negative for dysuria, vaginal bleeding and vaginal discharge.  All other systems reviewed and are negative.   Physical Exam Updated Vital Signs BP (!) 142/65 (BP Location: Left Arm)   Pulse 100   Temp 99 F (37.2 C)   Resp 18   Wt 103.2 kg   LMP 01/29/2020 Comment: neg 02/09/20  SpO2 100%   BMI 39.05 kg/m   Physical Exam Vitals and  nursing note reviewed.  Constitutional:      Appearance: She is well-developed. She is obese. She is not ill-appearing.  HENT:     Head: Normocephalic and atraumatic.  Eyes:     Pupils: Pupils are equal, round, and reactive to light.  Cardiovascular:     Rate and Rhythm: Normal rate and regular rhythm.     Heart sounds: Normal heart sounds.  Pulmonary:     Effort: Pulmonary effort is normal. No respiratory distress.     Breath sounds: No wheezing.  Abdominal:     General: Bowel sounds are normal.     Palpations: Abdomen is soft.     Tenderness: There is abdominal tenderness in the right lower  quadrant, suprapubic area and left lower quadrant. There is no guarding or rebound.  Musculoskeletal:     Cervical back: Neck supple.  Skin:    General: Skin is warm and dry.  Neurological:     General: No focal deficit present.     Mental Status: She is alert and oriented to person, place, and time.  Psychiatric:        Mood and Affect: Mood normal.     ED Results / Procedures / Treatments   Labs (all labs ordered are listed, but only abnormal results are displayed) Labs Reviewed  URINALYSIS, ROUTINE W REFLEX MICROSCOPIC - Abnormal; Notable for the following components:      Result Value   APPearance HAZY (*)    Leukocytes,Ua TRACE (*)    All other components within normal limits  COMPREHENSIVE METABOLIC PANEL - Abnormal; Notable for the following components:   Glucose, Bld 112 (*)    Calcium 8.7 (*)    ALT 45 (*)    All other components within normal limits  URINALYSIS, MICROSCOPIC (REFLEX) - Abnormal; Notable for the following components:   Bacteria, UA MANY (*)    All other components within normal limits  PREGNANCY, URINE  CBC WITH DIFFERENTIAL/PLATELET  LIPASE, BLOOD    EKG None  Radiology CT ABDOMEN PELVIS W CONTRAST  Result Date: 02/09/2020 CLINICAL DATA:  Lower abdominal pain since last night EXAM: CT ABDOMEN AND PELVIS WITH CONTRAST TECHNIQUE: Multidetector CT imaging of the abdomen and pelvis was performed using the standard protocol following bolus administration of intravenous contrast. CONTRAST:  OMNIPAQUE IOHEXOL 300 MG/ML  SOLN COMPARISON:  None available FINDINGS: Lower chest:  No contributory findings. Hepatobiliary: Hepatic steatosis. No evidence of mass lesion.Cholelithiasis without findings of acute cholecystitis. Pancreas: Unremarkable. Spleen: Unremarkable. Adrenals/Urinary Tract: Negative adrenals. No hydronephrosis or ureteral stone. 3 mm right renal calculus. Unremarkable bladder. Stomach/Bowel:  No obstruction. No appendicitis.  Vascular/Lymphatic: No acute vascular abnormality. No mass or adenopathy. Reproductive:  Tubal ligation clips which have become dissociated. Other: No ascites or pneumoperitoneum. Musculoskeletal: No acute abnormalities. IMPRESSION: 1. No acute finding. 2. Cholelithiasis, hepatic steatosis, and right nephrolithiasis. Electronically Signed   By: Marnee Spring M.D.   On: 02/09/2020 06:12    Procedures Procedures (including critical care time)  Medications Ordered in ED Medications  ketorolac (TORADOL) 15 MG/ML injection 15 mg (has no administration in time range)  morphine 4 MG/ML injection 4 mg (4 mg Intravenous Given 02/09/20 0512)  ondansetron (ZOFRAN) injection 4 mg (4 mg Intravenous Given 02/09/20 0512)  sodium chloride 0.9 % bolus 1,000 mL (1,000 mLs Intravenous New Bag/Given 02/09/20 0525)  iohexol (OMNIPAQUE) 300 MG/ML solution 100 mL (100 mLs Intravenous Contrast Given 02/09/20 0546)    ED Course  I have reviewed the triage  vital signs and the nursing notes.  Pertinent labs & imaging results that were available during my care of the patient were reviewed by me and considered in my medical decision making (see chart for details).    MDM Rules/Calculators/A&P                       Patient presents with intermittent ongoing pelvic pain.  History of dysfunctional uterine bleeding and menorrhagia.  Failed medical management and is scheduled for D&C.  Reports pain is the same but just intensified.  She is not actively bleeding at this time.  She has had multiple evaluations in the ED and by her OB/GYN.  She is overall nontoxic and vital signs are reassuring.  She has diffuse lower abdominal tenderness palpation without rebound or guarding.  Considerations include but not limited to uterine cramping, UTI.  Less likely ovarian cysts, appendicitis, colitis given lack of localized symptoms or other accompanying symptoms.  Patient was given pain and nausea medication.  Lab work obtained.  Lab  work-up is largely reassuring.  No evidence of UTI.  No significant anemia.  She is not pregnant.  Patient has only ever had ultrasounds for evaluation of this issue.  Will obtain a CT scan to rule out any other intra-abdominal pathology.  CT of the abdomen is relatively unremarkable and has multiple incidental findings but no obvious cause of her pain.  On recheck, patient states she feels much better.  Pelvic exam was deferred as she has recently had pelvic examination and I do not feel this would add to her work-up.  Recommend continuing scheduled ibuprofen at home and oxycodone as needed for breakthrough pain.  Follow-up with GYN.  After history, exam, and medical workup I feel the patient has been appropriately medically screened and is safe for discharge home. Pertinent diagnoses were discussed with the patient. Patient was given return precautions.   Final Clinical Impression(s) / ED Diagnoses Final diagnoses:  Pelvic pain    Rx / DC Orders ED Discharge Orders    None       Shon Baton, MD 02/09/20 [phone]

## 2020-02-09 NOTE — Discharge Instructions (Addendum)
You were seen today for ongoing pelvic pain.  Your work-up is largely reassuring.  CT scan does not show any other etiology for your pain.  Suspect that your pain is related to your ongoing GYN issues.  Contact your OB/GYN.  Make sure that you are taking scheduled ibuprofen every 6-8 hours in addition to your pain medication.

## 2020-02-09 NOTE — ED Triage Notes (Signed)
Pt arrives with lower abdominal cramping since 2000 last night. Reports OTC meds and oxycodone not effective for pain. Denies N/V D.

## 2020-02-15 ENCOUNTER — Other Ambulatory Visit: Payer: Self-pay

## 2020-02-15 ENCOUNTER — Encounter (HOSPITAL_BASED_OUTPATIENT_CLINIC_OR_DEPARTMENT_OTHER): Payer: Self-pay | Admitting: Emergency Medicine

## 2020-02-15 ENCOUNTER — Emergency Department (HOSPITAL_BASED_OUTPATIENT_CLINIC_OR_DEPARTMENT_OTHER)
Admission: EM | Admit: 2020-02-15 | Discharge: 2020-02-15 | Disposition: A | Discharge status: Home / Self Care | Payer: Medicaid Other | Attending: Emergency Medicine | Admitting: Emergency Medicine

## 2020-02-15 DIAGNOSIS — R109 Unspecified abdominal pain: Secondary | ICD-10-CM | POA: Diagnosis present

## 2020-02-15 DIAGNOSIS — R102 Pelvic and perineal pain: Secondary | ICD-10-CM | POA: Diagnosis not present

## 2020-02-15 DIAGNOSIS — F17210 Nicotine dependence, cigarettes, uncomplicated: Secondary | ICD-10-CM | POA: Diagnosis not present

## 2020-02-15 DIAGNOSIS — Z79899 Other long term (current) drug therapy: Secondary | ICD-10-CM | POA: Diagnosis not present

## 2020-02-15 LAB — URINALYSIS, ROUTINE W REFLEX MICROSCOPIC
Bilirubin Urine: NEGATIVE
Glucose, UA: NEGATIVE mg/dL
Hgb urine dipstick: NEGATIVE
Ketones, ur: NEGATIVE mg/dL
Leukocytes,Ua: NEGATIVE
Nitrite: NEGATIVE
Protein, ur: NEGATIVE mg/dL
Specific Gravity, Urine: 1.025 (ref 1.005–1.030)
pH: 6 (ref 5.0–8.0)

## 2020-02-15 LAB — PREGNANCY, URINE: Preg Test, Ur: NEGATIVE

## 2020-02-15 MED ORDER — HYDROMORPHONE HCL 1 MG/ML IJ SOLN
1.0000 mg | Freq: Once | INTRAMUSCULAR | Status: AC | PRN
  Administered 2020-02-15: 1 mg via INTRAVENOUS
  Filled 2020-02-15: qty 1

## 2020-02-15 MED ORDER — KETOROLAC TROMETHAMINE 15 MG/ML IJ SOLN
15.0000 mg | Freq: Once | INTRAMUSCULAR | Status: AC
  Administered 2020-02-15: 15 mg via INTRAVENOUS
  Filled 2020-02-15: qty 1

## 2020-02-15 MED ORDER — ONDANSETRON HCL 4 MG/2ML IJ SOLN
4.0000 mg | Freq: Once | INTRAMUSCULAR | Status: AC
  Administered 2020-02-15: 4 mg via INTRAVENOUS
  Filled 2020-02-15: qty 2

## 2020-02-15 NOTE — ED Triage Notes (Signed)
Pt states she is having "menstrual cramps" but is not on her menstrual cycle. She says she is f/u with her obgyn who is planning a D&C but "has not given a date yet". Pt is tearful in triage. Denies sx other than pain. LMP 01/29/20.

## 2020-02-15 NOTE — ED Provider Notes (Signed)
[facility] Provider Note: Georgena Spurling, MD, FACEP  CSN: [id number] MRN: [id number] ARRIVAL: 02/15/20 at [place]: [place]  Abdominal Pain   HISTORY OF PRESENT ILLNESS  02/15/20 2:43 AM Katie Spencer is a 39 y.o. female who has had about 4 months of severe urine cramping.  This was associated with 6 weeks of heavy bleeding which started about December 10, 2019.  The bleeding was brought under control with IV estrogen and birth control pills but she had some breakthrough bleeding Jan 29, 2020 but no bleeding since.  She is here with lower abdominal pain which began yesterday evening about 10 PM.  She describes as feeling like menstrual cramps but she is not currently bleeding.  She rates her pain is a 10 out of 10 with cramping and sharp components, worse with movement or palpation.  She denies urinary complaints.  She took ibuprofen and oxycodone at about 11 PM yesterday evening without relief of the pain.  She and her OB/GYN have discussed a D&C but she reportedly canceled it due to vacation plans.  She denies any symptoms other than pain.   CT scan performed in the ED 02/09/2020 was negative for acute findings.  There were incidental cholelithiasis, hepatic steatosis and right nephrolithiasis.     Past Medical History:  Diagnosis Date  . Abnormal vaginal bleeding     Past Surgical History:  Procedure Laterality Date  . CESAREAN SECTION  2006, 2009, 2013, 2014    No family history on file.  Social History   Tobacco Use  . Smoking status: Current Every Day Smoker    Packs/day: 1.00  . Smokeless tobacco: Never Used  Substance Use Topics  . 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 Drug use: Not on file    Prior to Admission medications   Medication Sig Start Date End Date Taking? Authorizing Provider  citalopram (CELEXA) 40 MG tablet Take 40 mg by mouth daily. 01/01/20   [provider]  hydrOXYzine  (ATARAX/VISTARIL) 10 MG tablet Take 10 mg by mouth 3 (three) times daily as needed. 01/01/20   [provider]  naproxen (NAPROSYN) 500 MG tablet Take by mouth. 12/25/19   [provider]  phentermine (ADIPEX-P) 37.5 MG tablet Take by mouth. 12/25/19 01/24/20  [provider]  tranexamic acid (LYSTEDA) 650 MG TABS tablet Take 1,300 mg by mouth 3 (three) times daily. 01/01/20   [provider]  VRAYLAR 6 MG CAPS Take 1 capsule by mouth daily. 01/02/20   [provider]    Allergies Patient has no known allergies.   REVIEW OF SYSTEMS  Negative except as noted here or in the History of Present Illness.   PHYSICAL EXAMINATION  Initial Vital Signs Blood pressure 125/80, pulse 91, temperature 98.2 F (36.8 C), temperature source Oral, resp. rate 20, height 5\' 4"  (1.626 m), weight 102.1 kg, last menstrual period 01/29/2020, SpO2 99 %.  Examination General: Well-developed, well-nourished female in no acute distress; appearance consistent with age of record HENT: normocephalic; atraumatic Eyes: pupils equal, round and reactive to light; extraocular muscles intact Neck: supple Heart: regular rate and rhythm; no murmurs, rubs or gallops Lungs: clear to auscultation bilaterally Abdomen: soft; nondistended; nontender; no masses or hepatosplenomegaly; bowel sounds present Extremities: No deformity; Spencer range of motion; pulses normal Neurologic: Awake, alert and oriented; motor function intact in all extremities and symmetric; no facial droop Skin: Warm and dry Psychiatric: Normal mood  and affect   RESULTS  Summary of this visit's results, reviewed and interpreted by myself:   EKG Interpretation  Date/Time:    Ventricular Rate:    PR Interval:    QRS Duration:   QT Interval:    QTC Calculation:   R Axis:     Text Interpretation:        Laboratory Studies: Results for orders placed or performed during the hospital encounter of 02/15/20 (from the  past 24 hour(s))  Pregnancy, urine     Status: None   Collection Time: 02/15/20  2:51 AM  Result Value Ref Range   Preg Test, Ur NEGATIVE NEGATIVE  Urinalysis, Routine w reflex microscopic     Status: Abnormal   Collection Time: 02/15/20  2:51 AM  Result Value Ref Range   Color, Urine YELLOW YELLOW   APPearance CLOUDY (A) CLEAR   Specific Gravity, Urine 1.025 1.005 - 1.030   pH 6.0 5.0 - 8.0   Glucose, UA NEGATIVE NEGATIVE mg/dL   Hgb urine dipstick NEGATIVE NEGATIVE   Bilirubin Urine NEGATIVE NEGATIVE   Ketones, ur NEGATIVE NEGATIVE mg/dL   Protein, ur NEGATIVE NEGATIVE mg/dL   Nitrite NEGATIVE NEGATIVE   Leukocytes,Ua NEGATIVE NEGATIVE   Imaging Studies: No results found.  ED COURSE and MDM  Nursing notes, initial and subsequent vitals signs, including pulse oximetry, reviewed and interpreted by myself.  Vitals:   02/15/20 0123 02/15/20 0126 02/15/20 0512  BP:  125/80 107/73  Pulse: 91  72  Resp: 20  18  Temp: 98.2 F (36.8 C)    TempSrc: Oral    SpO2: 99%  97%  Weight:  102.1 kg   Height:  5\' 4"  (1.626 m)    Medications  ketorolac (TORADOL) 15 MG/ML injection 15 mg (15 mg Intravenous Given 02/15/20 0348)  ondansetron (ZOFRAN) injection 4 mg (4 mg Intravenous Given 02/15/20 0348)  HYDROmorphone (DILAUDID) injection 1 mg (1 mg Intravenous Given 02/15/20 0504)   5:41 AM Patient's pain well controlled at this time.  She does have Percocet and 800 mg ibuprofen at home.  She was advised to use these as prescribed and to contact her OB/GYN as soon as possible to schedule definitive treatment.   PROCEDURES  Procedures   ED DIAGNOSES     ICD-10-CM   1. Pelvic pain in female  R10.2        Balen Woolum, 11-08-1978, MD 02/15/20 [phone]

## 2020-02-20 ENCOUNTER — Emergency Department (HOSPITAL_BASED_OUTPATIENT_CLINIC_OR_DEPARTMENT_OTHER)
Admission: EM | Admit: 2020-02-20 | Discharge: 2020-02-20 | Disposition: A | Discharge status: Home / Self Care | Payer: Medicaid Other | Attending: Emergency Medicine | Admitting: Emergency Medicine

## 2020-02-20 ENCOUNTER — Encounter (HOSPITAL_BASED_OUTPATIENT_CLINIC_OR_DEPARTMENT_OTHER): Payer: Self-pay | Admitting: Emergency Medicine

## 2020-02-20 ENCOUNTER — Other Ambulatory Visit: Payer: Self-pay

## 2020-02-20 DIAGNOSIS — F17210 Nicotine dependence, cigarettes, uncomplicated: Secondary | ICD-10-CM | POA: Diagnosis not present

## 2020-02-20 DIAGNOSIS — G8929 Other chronic pain: Secondary | ICD-10-CM | POA: Diagnosis not present

## 2020-02-20 DIAGNOSIS — Z79899 Other long term (current) drug therapy: Secondary | ICD-10-CM | POA: Diagnosis not present

## 2020-02-20 DIAGNOSIS — R102 Pelvic and perineal pain: Secondary | ICD-10-CM | POA: Diagnosis not present

## 2020-02-20 LAB — URINALYSIS, ROUTINE W REFLEX MICROSCOPIC
Bilirubin Urine: NEGATIVE
Glucose, UA: NEGATIVE mg/dL
Hgb urine dipstick: NEGATIVE
Ketones, ur: NEGATIVE mg/dL
Leukocytes,Ua: NEGATIVE
Nitrite: NEGATIVE
Protein, ur: NEGATIVE mg/dL
Specific Gravity, Urine: 1.01 (ref 1.005–1.030)
pH: 6.5 (ref 5.0–8.0)

## 2020-02-20 LAB — PREGNANCY, URINE: Preg Test, Ur: NEGATIVE

## 2020-02-20 MED ORDER — HYDROMORPHONE HCL 1 MG/ML IJ SOLN
1.0000 mg | Freq: Once | INTRAMUSCULAR | Status: AC
  Administered 2020-02-20: 1 mg via INTRAMUSCULAR
  Filled 2020-02-20: qty 1

## 2020-02-20 MED ORDER — KETOROLAC TROMETHAMINE 60 MG/2ML IM SOLN
60.0000 mg | Freq: Once | INTRAMUSCULAR | Status: AC
  Administered 2020-02-20: 60 mg via INTRAMUSCULAR
  Filled 2020-02-20: qty 2

## 2020-02-20 MED ORDER — OXYCODONE-ACETAMINOPHEN 5-325 MG PO TABS: 2.0000 | ORAL_TABLET | Freq: Four times a day (QID) | ORAL | 0 refills | Status: DC | PRN

## 2020-02-20 NOTE — ED Triage Notes (Signed)
Pt states she is having abd cramping  Pt states it started about 10 pm last night  Pt states she took a pain pill and went to sleep and it woke her up   Last period was May 13th

## 2020-02-20 NOTE — ED Provider Notes (Signed)
TIME SEEN: 4:57 AM  CHIEF COMPLAINT: Chronic pelvic pain  HPI: Patient is a 39 year old female with history of chronic pelvic pain scheduled for hysteroscopy with D&C with Dr. Annye Rusk on 03/19/2020 who presents to the  emergency department for pelvic pain.  She describes it as severe in nature and is unchanged from her chronic pain.  She is not actively bleeding.  Last menstrual period was 01/29/2020.  No discharge, dysuria, hematuria, nausea, vomiting or diarrhea, fever.  Pain is diffusely throughout the lower abdomen and is sharp in nature.  She reports she took her last Percocet at 8 PM last night and ibuprofen at 1 AM.  This appears to be her seventh ED visit (between [facility] and [facility]) since April 2021 for the same.  ROS: See HPI Constitutional: no fever  Eyes: no drainage  ENT: no runny nose   Cardiovascular:  no chest pain  Resp: no SOB  GI: no vomiting GU: no dysuria Integumentary: no rash  Allergy: no hives  Musculoskeletal: no leg swelling  Neurological: no slurred speech ROS otherwise negative  PAST MEDICAL HISTORY/PAST SURGICAL HISTORY:  Past Medical History:  Diagnosis Date  . Abnormal vaginal bleeding     MEDICATIONS:  Prior to Admission medications   Medication Sig Start Date End Date Taking? Authorizing Provider  citalopram (CELEXA) 40 MG tablet Take 40 mg by mouth daily. 01/01/20   [provider]  hydrOXYzine (ATARAX/VISTARIL) 10 MG tablet Take 10 mg by mouth 3 (three) times daily as needed. 01/01/20   [provider]  naproxen (NAPROSYN) 500 MG tablet Take by mouth. 12/25/19   [provider]  phentermine (ADIPEX-P) 37.5 MG tablet Take by mouth. 12/25/19 01/24/20  [provider]  tranexamic acid (LYSTEDA) 650 MG TABS tablet Take 1,300 mg by mouth 3 (three) times daily. 01/01/20   [provider]  VRAYLAR 6 MG CAPS Take 1 capsule by mouth daily. 01/02/20   [provider]    ALLERGIES:  No Known Allergies  SOCIAL  HISTORY:  Social History   Tobacco Use  . Smoking status: Current Every Day Smoker    Packs/day: 1.00    Types: Cigarettes  . Smokeless tobacco: Never Used  Substance Use Topics  . 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 No pertinent family history.  EXAM: BP 140/78 (BP Location: Left Arm)   Pulse 90   Temp 99 F (37.2 C) (Oral)   Resp 18   Ht 5\' 4"  (1.626 m)   Wt 104.3 kg   LMP 01/29/2020 (Exact Date) Comment: neg 02/09/20  SpO2 99%   BMI 39.48 kg/m  CONSTITUTIONAL: Alert and oriented and responds appropriately to questions. Well-appearing; well-nourished HEAD: Normocephalic EYES: Conjunctivae clear, pupils appear equal, EOM appear intact ENT: normal nose; moist mucous membranes NECK: Supple, normal ROM CARD: RRR; S1 and S2 appreciated; no murmurs, no clicks, no rubs, no gallops RESP: Normal chest excursion without splinting or tachypnea; breath sounds clear and equal bilaterally; no wheezes, no rhonchi, no rales, no hypoxia or respiratory distress, speaking full sentences ABD/GI: Normal bowel sounds; non-distended; soft, mildly tender throughout the lower abdomen, no rebound, no guarding, no peritoneal signs, no hepatosplenomegaly BACK:  The back appears normal EXT: Normal ROM in all joints; no deformity noted, no edema; no cyanosis SKIN: Normal color for age and race; warm; no rash on exposed skin NEURO: Moves all extremities equally PSYCH: The patient's mood and manner are appropriate.  MEDICAL DECISION MAKING: Patient here with complaints of acute exacerbation of her chronic pelvic pain.  Urine shows no sign of infection and she is not pregnant.  She denies any bleeding or discharge currently.  She has been worked up for this previously and has surgery scheduled on July 2.  I do not feel she needs a further emergent work-up today.  I suspect that she is also here because she is out of her Percocet.  I strongly encouraged  her to follow-up with her primary care doctor given this is her seventh emergency department visit in 2 months for the same.  Will provide with prescription for 10 Percocet tablets and give dose of IM Toradol and Dilaudid here for pain control.  At this time, I do not feel there is any life-threatening condition present. I have reviewed, interpreted and discussed all results (EKG, imaging, lab, urine as appropriate) and exam findings with patient/family. I have reviewed nursing notes and appropriate previous records.  I feel the patient is safe to be discharged home without further emergent workup and can continue workup as an outpatient as needed. Discussed usual and customary return precautions. Patient/family verbalize understanding and are comfortable with this plan.  Outpatient follow-up has been provided as needed. All questions have been answered.   Katie Spencer was evaluated in Emergency Department on 02/20/2020 for the symptoms described in the history of present illness. She was evaluated in the context of the global COVID-19 pandemic, which necessitated consideration that the patient might be at risk for infection with the SARS-CoV-2 virus that causes COVID-19. Institutional protocols and algorithms that pertain to the evaluation of patients at risk for COVID-19 are in a state of rapid change based on information released by regulatory bodies including the CDC and federal and state organizations. These policies and algorithms were followed during the patient's care in the ED.      Katie Spencer, Katie Bison, DO 02/20/20 [phone]

## 2020-02-20 NOTE — Discharge Instructions (Addendum)
Please follow-up with your OB/GYN for further pain management for your pelvic pain.

## 2020-03-01 ENCOUNTER — Encounter (HOSPITAL_BASED_OUTPATIENT_CLINIC_OR_DEPARTMENT_OTHER): Payer: Self-pay

## 2020-03-01 ENCOUNTER — Emergency Department (HOSPITAL_BASED_OUTPATIENT_CLINIC_OR_DEPARTMENT_OTHER)
Admission: EM | Admit: 2020-03-01 | Discharge: 2020-03-02 | Disposition: A | Discharge status: Home / Self Care | Payer: Medicaid Other | Attending: Emergency Medicine | Admitting: Emergency Medicine

## 2020-03-01 ENCOUNTER — Other Ambulatory Visit: Payer: Self-pay

## 2020-03-01 DIAGNOSIS — Z791 Long term (current) use of non-steroidal anti-inflammatories (NSAID): Secondary | ICD-10-CM | POA: Diagnosis not present

## 2020-03-01 DIAGNOSIS — N946 Dysmenorrhea, unspecified: Secondary | ICD-10-CM | POA: Diagnosis not present

## 2020-03-01 DIAGNOSIS — F17210 Nicotine dependence, cigarettes, uncomplicated: Secondary | ICD-10-CM | POA: Diagnosis not present

## 2020-03-01 LAB — URINALYSIS, MICROSCOPIC (REFLEX)

## 2020-03-01 LAB — URINALYSIS, ROUTINE W REFLEX MICROSCOPIC
Bilirubin Urine: NEGATIVE
Glucose, UA: NEGATIVE mg/dL
Ketones, ur: NEGATIVE mg/dL
Leukocytes,Ua: NEGATIVE
Nitrite: NEGATIVE
Protein, ur: NEGATIVE mg/dL
Specific Gravity, Urine: 1.025 (ref 1.005–1.030)
pH: 6 (ref 5.0–8.0)

## 2020-03-01 LAB — PREGNANCY, URINE: Preg Test, Ur: NEGATIVE

## 2020-03-01 NOTE — ED Triage Notes (Signed)
Pt c/o painful menstrual cycle tonight that has been reoccurring for the past 4 months.  Pt has OB/GYN follow up tomorrow.

## 2020-03-02 MED ORDER — ONDANSETRON 4 MG PO TBDP
8.0000 mg | ORAL_TABLET | Freq: Once | ORAL | Status: AC
  Administered 2020-03-02: 8 mg via ORAL
  Filled 2020-03-02: qty 2

## 2020-03-02 MED ORDER — HYDROMORPHONE HCL 1 MG/ML IJ SOLN
2.0000 mg | Freq: Once | INTRAMUSCULAR | Status: AC
  Administered 2020-03-02: 2 mg via INTRAMUSCULAR
  Filled 2020-03-02: qty 2

## 2020-03-02 MED ORDER — OXYCODONE-ACETAMINOPHEN 5-325 MG PO TABS: 1.0000 | ORAL_TABLET | ORAL | 0 refills | Status: DC | PRN

## 2020-03-02 NOTE — ED Provider Notes (Signed)
[facility] Provider Note: [name], MD, FACEP  CSN: [id number] MRN: [id number] ARRIVAL: 03/01/20 at 2246 ROOM: MH02/MH02   CHIEF COMPLAINT  Dysmenorrhea   HISTORY OF PRESENT ILLNESS  03/02/20 12:35 AM Katie Spencer is a 39 y.o. female who has had a 37-month history of severe, cramping pain with her menses.  She is currently on her menses which began yesterday.  She rates her current pain is a 10 out of 10.  She has tried NSAIDs and tramadol without relief.  She was seen for this in [facility] [place] 02/26/2020.  An ultrasound of the pelvis showed bilateral ovarian cysts.  A CT of the abdomen and pelvis showed nonobstructing right kidney stone.  She has an appointment with her OB/GYN this afternoon at 2 PM.  She is requesting pain relief in the meantime.   Past Medical History:  Diagnosis Date  . Abnormal vaginal bleeding     Past Surgical History:  Procedure Laterality Date  . CESAREAN SECTION  2006, 2009, 2013, 2014    No family history on file.  Social History   Tobacco Use  . Smoking status: Current Every Day Smoker    Packs/day: 1.00    Types: Cigarettes  . Smokeless tobacco: Never Used  Vaping Use  . Vaping Use: Never used  Substance Use Topics  . 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 Drug use: Never    Prior to Admission medications   Medication Sig Start Date End Date Taking? Authorizing Provider  citalopram (CELEXA) 40 MG tablet Take 40 mg by mouth daily. 01/01/20   [provider]  hydrOXYzine (ATARAX/VISTARIL) 10 MG tablet Take 10 mg by mouth 3 (three) times daily as needed. 01/01/20   [provider]  naproxen (NAPROSYN) 500 MG tablet Take by mouth. 12/25/19   [provider]  oxyCODONE-acetaminophen (PERCOCET/ROXICET) 5-325 MG tablet Take 1 tablet by mouth every 4 (four) hours as needed for severe pain. 03/02/20   Geral Coker, MD  phentermine (ADIPEX-P) 37.5 MG tablet Take by mouth.  12/25/19 01/24/20  [provider]  tranexamic acid (LYSTEDA) 650 MG TABS tablet Take 1,300 mg by mouth 3 (three) times daily. 01/01/20   [provider]  VRAYLAR 6 MG CAPS Take 1 capsule by mouth daily. 01/02/20   [provider]    Allergies Patient has no known allergies.   REVIEW OF SYSTEMS  Negative except as noted here or in the History of Present Illness.   PHYSICAL EXAMINATION  Initial Vital Signs Blood pressure 126/78, pulse 66, temperature 98.6 F (37 C), temperature source Oral, resp. rate 20, height 5\' 4"  (1.626 m), weight 104.3 kg, last menstrual period 03/01/2020, SpO2 98 %.  Examination General: Well-developed, well-nourished female in no acute distress; appearance consistent with age of record HENT: normocephalic; atraumatic Eyes: Normal appearance Neck: supple Heart: regular rate and rhythm Lungs: clear to auscultation bilaterally Abdomen: soft; nondistended; suprapubic tenderness; bowel sounds present Extremities: No deformity; full range of motion; pulses normal Neurologic: Awake, alert and oriented; motor function intact in all extremities and symmetric; no facial droop Skin: Warm and dry Psychiatric: Normal mood and affect   RESULTS  Summary of this visit's results, reviewed and interpreted by myself:   EKG Interpretation  Date/Time:    Ventricular Rate:    PR Interval:    QRS Duration:   QT Interval:    QTC Calculation:   R Axis:     Text Interpretation:  Laboratory Studies: Results for orders placed or performed during the hospital encounter of 03/01/20 (from the past 24 hour(s))  Urinalysis, Routine w reflex microscopic     Status: Abnormal   Collection Time: 03/01/20 11:24 PM  Result Value Ref Range   Color, Urine YELLOW YELLOW   APPearance CLEAR CLEAR   Specific Gravity, Urine 1.025 1.005 - 1.030   pH 6.0 5.0 - 8.0   Glucose, UA NEGATIVE NEGATIVE mg/dL   Hgb urine dipstick SMALL (A) NEGATIVE   Bilirubin  Urine NEGATIVE NEGATIVE   Ketones, ur NEGATIVE NEGATIVE mg/dL   Protein, ur NEGATIVE NEGATIVE mg/dL   Nitrite NEGATIVE NEGATIVE   Leukocytes,Ua NEGATIVE NEGATIVE  Pregnancy, urine     Status: None   Collection Time: 03/01/20 11:24 PM  Result Value Ref Range   Preg Test, Ur NEGATIVE NEGATIVE  Urinalysis, Microscopic (reflex)     Status: Abnormal   Collection Time: 03/01/20 11:24 PM  Result Value Ref Range   RBC / HPF 0-5 0 - 5 RBC/hpf   WBC, UA 0-5 0 - 5 WBC/hpf   Bacteria, UA FEW (A) NONE SEEN   Squamous Epithelial / LPF 0-5 0 - 5   Imaging Studies: No results found.  ED COURSE and MDM  Nursing notes, initial and subsequent vitals signs, including pulse oximetry, reviewed and interpreted by myself.  Vitals:   03/01/20 2255  BP: 126/78  Pulse: 66  Resp: 20  Temp: 98.6 F (37 C)  TempSrc: Oral  SpO2: 98%  Weight: 104.3 kg  Height: 5\' 4"  (1.626 m)   Medications  HYDROmorphone (DILAUDID) injection 2 mg (has no administration in time range)  ondansetron (ZOFRAN-ODT) disintegrating tablet 8 mg (has no administration in time range)    We will give patient a brief course of narcotic analgesia pending her appointment this afternoon.  PROCEDURES  Procedures   ED DIAGNOSES     ICD-10-CM   1. Dysmenorrhea  N94.6        Shawne Bulow, Jenny Reichmann, MD 03/02/20 [phone]

## 2020-04-11 ENCOUNTER — Emergency Department (HOSPITAL_BASED_OUTPATIENT_CLINIC_OR_DEPARTMENT_OTHER)
Admission: EM | Admit: 2020-04-11 | Discharge: 2020-04-12 | Disposition: A | Discharge status: Home / Self Care | Payer: Medicaid Other | Attending: Emergency Medicine | Admitting: Emergency Medicine

## 2020-04-11 ENCOUNTER — Other Ambulatory Visit: Payer: Self-pay

## 2020-04-11 ENCOUNTER — Encounter (HOSPITAL_BASED_OUTPATIENT_CLINIC_OR_DEPARTMENT_OTHER): Payer: Self-pay | Admitting: Emergency Medicine

## 2020-04-11 DIAGNOSIS — R1030 Lower abdominal pain, unspecified: Secondary | ICD-10-CM | POA: Diagnosis present

## 2020-04-11 DIAGNOSIS — G8929 Other chronic pain: Secondary | ICD-10-CM

## 2020-04-11 DIAGNOSIS — R1032 Left lower quadrant pain: Secondary | ICD-10-CM

## 2020-04-11 NOTE — ED Notes (Signed)
Given specimen cup to obtain sample, reports she's unable to urinate at this time

## 2020-04-11 NOTE — ED Triage Notes (Signed)
Pt reports menstrual cramping - scheduled for hysterectomy on Wednesday to resolve cramping. LMP 7/18

## 2020-04-12 LAB — URINALYSIS, ROUTINE W REFLEX MICROSCOPIC
Glucose, UA: NEGATIVE mg/dL
Ketones, ur: NEGATIVE mg/dL
Nitrite: NEGATIVE
Protein, ur: NEGATIVE mg/dL
Specific Gravity, Urine: 1.025 (ref 1.005–1.030)
pH: 6 (ref 5.0–8.0)

## 2020-04-12 LAB — URINALYSIS, MICROSCOPIC (REFLEX)

## 2020-04-12 LAB — PREGNANCY, URINE: Preg Test, Ur: NEGATIVE

## 2020-04-12 MED ORDER — HYDROMORPHONE HCL 1 MG/ML IJ SOLN
1.0000 mg | Freq: Once | INTRAMUSCULAR | Status: AC
  Administered 2020-04-12: 1 mg via INTRAMUSCULAR
  Filled 2020-04-12: qty 1

## 2020-04-12 MED ORDER — CEPHALEXIN 500 MG PO CAPS: 1000.0000 mg | ORAL_CAPSULE | Freq: Two times a day (BID) | ORAL | 0 refills | Status: DC

## 2020-04-12 MED ORDER — OXYCODONE-ACETAMINOPHEN 5-325 MG PO TABS: 1.0000 | ORAL_TABLET | Freq: Four times a day (QID) | ORAL | 0 refills | Status: DC | PRN

## 2020-04-12 MED ORDER — OXYCODONE-ACETAMINOPHEN 5-325 MG PO TABS
2.0000 | ORAL_TABLET | Freq: Once | ORAL | Status: AC
  Administered 2020-04-12: 2 via ORAL
  Filled 2020-04-12: qty 2

## 2020-04-12 MED ORDER — CEPHALEXIN 250 MG PO CAPS: 500.0000 mg | ORAL_CAPSULE | Freq: Once | ORAL | Status: DC

## 2020-04-12 NOTE — ED Provider Notes (Signed)
[facility] EMERGENCY DEPARTMENT Provider Note   CSN: [id number] Arrival date & time: 04/11/20  1931     History Chief Complaint  Patient presents with  . Other    menstrual cramping    Katie Spencer is a 39 y.o. female.  Patient c/o bilateral lower abd pain for the past 3-4 months. Symptoms gradual onset, constant, persistent, dull, non radiating. Pt indicates symptoms started with long, irregular periods, and pain during menstrual cycles, but that in past 3-4 months pain has become constant. Has seen pcp, ob/gyn and in ED for same. Has plans for hysterectomy in the next 1-2 weeks, but states is out of percocet for pain - otherwise no acute or abrupt change in nature, severity or location of her pain. No abd distension. No nausea/vomiting. Is having normal bms. No vaginal discharge or bleeding. No back/flank pain. No fever or chills. Denies hx endometriosis. States has had multiple lab and imaging studies, and is not interested in additional imaging tonight.   The history is provided by the patient.       Past Medical History:  Diagnosis Date  . Abnormal vaginal bleeding     There are no problems to display for this patient.   Past Surgical History:  Procedure Laterality Date  . CESAREAN SECTION  2006, 2009, 2013, 2014     OB History   No obstetric history on file.     History reviewed. No pertinent family history.  Social History   Tobacco Use  . Smoking status: Current Every Day Smoker    Packs/day: 1.00    Types: Cigarettes  . Smokeless tobacco: Never Used  Vaping Use  . Vaping Use: Never used  Substance Use Topics  . 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 Drug use: Never    Home Medications Prior to Admission medications   Medication Sig Start Date End Date Taking? Authorizing Provider  citalopram (CELEXA) 40 MG tablet Take 40 mg by mouth daily. 01/01/20   [provider]  hydrOXYzine (ATARAX/VISTARIL) 10 MG  tablet Take 10 mg by mouth 3 (three) times daily as needed. 01/01/20   [provider]  naproxen (NAPROSYN) 500 MG tablet Take by mouth. 12/25/19   [provider]  oxyCODONE-acetaminophen (PERCOCET/ROXICET) 5-325 MG tablet Take 1 tablet by mouth every 4 (four) hours as needed for severe pain. 03/02/20   Molpus, John, MD  phentermine (ADIPEX-P) 37.5 MG tablet Take by mouth. 12/25/19 01/24/20  [provider]  tranexamic acid (LYSTEDA) 650 MG TABS tablet Take 1,300 mg by mouth 3 (three) times daily. 01/01/20   [provider]  VRAYLAR 6 MG CAPS Take 1 capsule by mouth daily. 01/02/20   [provider]    Allergies    Patient has no known allergies.  Review of Systems   Review of Systems  Constitutional: Negative for chills and fever.  HENT: Negative for sore throat.   Eyes: Negative for redness.  Respiratory: Negative for shortness of breath.   Cardiovascular: Negative for chest pain.  Gastrointestinal: Positive for abdominal pain. Negative for nausea and vomiting.  Endocrine: Negative for polyuria.  Genitourinary: Negative for dysuria.  Musculoskeletal: Negative for back pain.  Skin: Negative for rash.  Neurological: Negative for numbness.  Hematological: Does not bruise/bleed easily.  Psychiatric/Behavioral: Negative for confusion.    Physical Exam Updated Vital Signs BP (!) 99/55 (BP Location: Right Arm)   Pulse 69   Temp 99.6 F (37.6 C)  Resp 18   Wt (!) 100.6 kg   LMP 04/04/2020   SpO2 99%   BMI 38.07 kg/m   Physical Exam Vitals and nursing note reviewed.  Constitutional:      Appearance: Normal appearance. She is well-developed.  HENT:     Head: Atraumatic.     Nose: Nose normal.     Mouth/Throat:     Mouth: Mucous membranes are moist.  Eyes:     General: No scleral icterus.    Conjunctiva/sclera: Conjunctivae normal.  Neck:     Trachea: No tracheal deviation.  Cardiovascular:     Rate and Rhythm: Normal rate and  regular rhythm.     Pulses: Normal pulses.     Heart sounds: Normal heart sounds. No murmur heard.  No friction rub. No gallop.   Pulmonary:     Effort: Pulmonary effort is normal. No respiratory distress.     Breath sounds: Normal breath sounds.  Abdominal:     General: Bowel sounds are normal. There is no distension.     Palpations: Abdomen is soft. There is no mass.     Tenderness: There is no abdominal tenderness. There is no guarding or rebound.     Hernia: No hernia is present.  Genitourinary:    Comments: No cva tenderness.  Musculoskeletal:        General: No swelling.     Cervical back: Neck supple. No muscular tenderness.  Skin:    General: Skin is warm and dry.     Findings: No rash.  Neurological:     Mental Status: She is alert.     Comments: Alert, speech normal.   Psychiatric:        Mood and Affect: Mood normal.     ED Results / Procedures / Treatments   Labs (all labs ordered are listed, but only abnormal results are displayed) Results for orders placed or performed during the hospital encounter of 04/11/20  Pregnancy, urine  Result Value Ref Range   Preg Test, Ur NEGATIVE NEGATIVE  Urinalysis, Routine w reflex microscopic  Result Value Ref Range   Color, Urine YELLOW YELLOW   APPearance CLOUDY (A) CLEAR   Specific Gravity, Urine 1.025 1.005 - 1.030   pH 6.0 5.0 - 8.0   Glucose, UA NEGATIVE NEGATIVE mg/dL   Hgb urine dipstick MODERATE (A) NEGATIVE   Bilirubin Urine SMALL (A) NEGATIVE   Ketones, ur NEGATIVE NEGATIVE mg/dL   Protein, ur NEGATIVE NEGATIVE mg/dL   Nitrite NEGATIVE NEGATIVE   Leukocytes,Ua SMALL (A) NEGATIVE  Urinalysis, Microscopic (reflex)  Result Value Ref Range   RBC / HPF 0-5 0 - 5 RBC/hpf   WBC, UA 11-20 0 - 5 WBC/hpf   Bacteria, UA MANY (A) NONE SEEN   Squamous Epithelial / LPF 6-10 0 - 5   Mucus PRESENT    Ca Oxalate Crys, UA PRESENT     EKG None  Radiology No results found.  Procedures Procedures (including  critical care time)  Medications Ordered in ED Medications  oxyCODONE-acetaminophen (PERCOCET/ROXICET) 5-325 MG per tablet 2 tablet (2 tablets Oral Given 04/12/20 0024)    ED Course  I have reviewed the triage vital signs and the nursing notes.  Pertinent labs & imaging results that were available during my care of the patient were reviewed by me and considered in my medical decision making (see chart for details).    MDM Rules/Calculators/A&P  Reviewed nursing notes and prior charts for additional history.  Pt with prior u/s's and Cts for same pain - no specific lower abd/pelvic cause of symptoms noted - pt is planned for hysterectomy.   Pt requests pain medication, states out of percocet. Pt also indicates 'got dropped off here', and does not need to drive home. Percocet 2 po.   Recheck, pt requests additional pain medication.   Abd is soft, nt. No emesis. Afebrile. Await ua/upreg.   Dilaudid 1 mg im.   Labs reviewed/interpreted by me - 11-20 wbc, many bacteria (also some epithelial cells) - possible uti. Keflex rx.   Small quantity pain rx for home.   Close gyn f/u as outpt.   Return precautions provided.     Final Clinical Impression(s) / ED Diagnoses Final diagnoses:  None    Rx / DC Orders ED Discharge Orders    None       Cathren Laine, MD 04/12/20 [phone]

## 2020-04-12 NOTE — Discharge Instructions (Addendum)
It was our pleasure to provide your ER care today - we hope that you feel better.  Rest. Drink plenty of fluids. Take motrin or aleve as need for pain. You may also take percocet as need for pain. No driving for the next 8 hours or when taking percocet. Also, do not take tylenol or acetaminophen containing medication when taking percocet.   Your urine test shows a possible uti - take keflex (antibiotic) as prescribed.   Follow up closely with your doctor in the coming week.   Return to ER if worse, new symptoms, fevers, worsening or severe/intractable pain, persistent vomiting, or other concern.

## 2020-04-12 NOTE — ED Notes (Signed)
Pt reports no relief from percocet (See EMAR). Dr. Denton Lank notified. Awaiting new orders.

## 2020-04-12 NOTE — ED Notes (Signed)
Pt reports almost immediate relief after IM dilaudid.

## 2020-04-12 NOTE — ED Notes (Signed)
EDP notified of patient's continued pain. Awaiting new orders.

## 2020-04-15 ENCOUNTER — Encounter (HOSPITAL_BASED_OUTPATIENT_CLINIC_OR_DEPARTMENT_OTHER): Payer: Self-pay

## 2020-04-15 ENCOUNTER — Other Ambulatory Visit: Payer: Self-pay

## 2020-04-15 DIAGNOSIS — G8929 Other chronic pain: Secondary | ICD-10-CM | POA: Insufficient documentation

## 2020-04-15 DIAGNOSIS — R102 Pelvic and perineal pain: Secondary | ICD-10-CM | POA: Insufficient documentation

## 2020-04-15 DIAGNOSIS — F17210 Nicotine dependence, cigarettes, uncomplicated: Secondary | ICD-10-CM | POA: Diagnosis not present

## 2020-04-15 DIAGNOSIS — R109 Unspecified abdominal pain: Secondary | ICD-10-CM | POA: Diagnosis present

## 2020-04-15 NOTE — ED Triage Notes (Signed)
Pt c/o abd cramps "again"-seen for same 7/25 for same-NAD-steady gait

## 2020-04-16 ENCOUNTER — Emergency Department (HOSPITAL_BASED_OUTPATIENT_CLINIC_OR_DEPARTMENT_OTHER)
Admission: EM | Admit: 2020-04-16 | Discharge: 2020-04-16 | Disposition: A | Discharge status: Home / Self Care | Payer: Medicaid Other | Attending: Emergency Medicine | Admitting: Emergency Medicine

## 2020-04-16 ENCOUNTER — Encounter (HOSPITAL_BASED_OUTPATIENT_CLINIC_OR_DEPARTMENT_OTHER): Payer: Self-pay | Admitting: Emergency Medicine

## 2020-04-16 DIAGNOSIS — R102 Pelvic and perineal pain: Secondary | ICD-10-CM

## 2020-04-16 DIAGNOSIS — G8929 Other chronic pain: Secondary | ICD-10-CM

## 2020-04-16 HISTORY — DX: Other chronic pain: G89.29

## 2020-04-16 MED ORDER — LANSOPRAZOLE 30 MG PO CPDR
DELAYED_RELEASE_CAPSULE | ORAL | 0 refills | Status: DC
Start: 2020-04-16 — End: 2020-08-18

## 2020-04-16 MED ORDER — KETOROLAC TROMETHAMINE 30 MG/ML IJ SOLN
30.0000 mg | Freq: Once | INTRAMUSCULAR | Status: AC
  Administered 2020-04-16: 30 mg via INTRAMUSCULAR
  Filled 2020-04-16: qty 1

## 2020-04-16 MED ORDER — IBUPROFEN 800 MG PO TABS
800.0000 mg | ORAL_TABLET | Freq: Three times a day (TID) | ORAL | 0 refills | Status: DC | PRN
Start: 2020-04-16 — End: 2020-09-20

## 2020-04-16 NOTE — ED Provider Notes (Signed)
[facility] Provider Note: [name], MD, FACEP  CSN: [id number] MRN: [id number] ARRIVAL: 04/15/20 at 2210 ROOM: MH05/MH05   CHIEF COMPLAINT  Abdominal Pain   HISTORY OF PRESENT ILLNESS  04/16/20 12:49 AM Katie Spencer is a 39 y.o. female who was seen by myself on 03/02/2020 for a 25-month history of severe, cramping pain associated with her menses.  On 02/26/2020 in [place] she had an ultrasound of the pelvis which showed bilateral ovarian cysts.  A CT of the abdomen and pelvis showed a nonobstructing right kidney stone.  She was supposed to follow-up with OB/GYN that afternoon.  She was seen again at [facility] on 03/24/2020, again for 4 months of abdominal cramping in the suprapubic region.  She noted it was preceded by 7 weeks of vaginal bleeding.  She was diagnosed with gallstones and kidney stones.  She was seen again in the ED 04/12/2020 for similar pain which had become constant.  A CT of the abdomen and pelvis showed cholelithiasis, hepatic steatosis and right nephrolithiasis.  She was given 15 oxycodone tablets at that visit which was the eighth narcotic prescription she has received since May of this year.  She was also given Keflex for possible urinary tract infection.  Urine culture was not performed.  She is here this morning with worsening pain, primarily on the left.  She states she has an adequate number of oxycodone tablets but is asking for something else for pain.  She has been taking over-the-counter ibuprofen but has not had a prescription for ibuprofen.  She is denying any new symptoms.  She is scheduled for hysterectomy 2020-04-28.   Past Medical History:  Diagnosis Date  . Abnormal vaginal bleeding   . Chronic abdominal pain     Past Surgical History:  Procedure Laterality Date  . CESAREAN SECTION  2006, 2009, 2013, 2014    No family history on file.  Social History   Tobacco Use  . Smoking status: Current Every Day Smoker     Packs/day: 1.00    Types: Cigarettes  . Smokeless tobacco: Never Used  Vaping Use  . Vaping Use: Never used  Substance Use Topics  . 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 Drug use: Never    Prior to Admission medications   Medication Sig Start Date End Date Taking? Authorizing Provider  cephALEXin (KEFLEX) 500 MG capsule Take 2 capsules (1,000 mg total) by mouth 2 (two) times daily. 04/12/20   Cathren Laine, MD  citalopram (CELEXA) 40 MG tablet Take 40 mg by mouth daily. 01/01/20   [provider]  hydrOXYzine (ATARAX/VISTARIL) 10 MG tablet Take 10 mg by mouth 3 (three) times daily as needed. 01/01/20   [provider]  ibuprofen (ADVIL) 800 MG tablet Take 1 tablet (800 mg total) by mouth every 8 (eight) hours as needed (for pain). 04/16/20   Harvy Riera, MD  lansoprazole (PREVACID) 30 MG capsule Take 1 capsule daily while taking ibuprofen. 04/16/20   Aeriana Speece, MD  oxyCODONE-acetaminophen (PERCOCET/ROXICET) 5-325 MG tablet Take 1-2 tablets by mouth every 6 (six) hours as needed for severe pain. 04/12/20   Cathren Laine, MD  phentermine (ADIPEX-P) 37.5 MG tablet Take by mouth. 12/25/19 01/24/20  [provider]  tranexamic acid (LYSTEDA) 650 MG TABS tablet Take 1,300 mg by mouth 3 (three) times daily. 01/01/20   [provider]  VRAYLAR 6 MG CAPS Take 1 capsule by mouth daily. 01/02/20  [provider]    Allergies Patient has no known allergies.   REVIEW OF SYSTEMS  Negative except as noted here or in the History of Present Illness.   PHYSICAL EXAMINATION  Initial Vital Signs Blood pressure (!) 138/91, pulse 72, temperature 98.5 F (36.9 C), temperature source Oral, resp. rate 16, height 5\' 4"  (1.626 m), weight (!) 101.2 kg, last menstrual period 04/04/2020, SpO2 100 %.  Examination General: Well-developed, well-nourished female in no acute distress; appearance consistent with age of record HENT: normocephalic;  atraumatic Eyes: pupils equal, round and reactive to light; extraocular muscles intact Neck: supple Heart: regular rate and rhythm Lungs: clear to auscultation bilaterally Abdomen: soft; nondistended; left suprapubic tenderness; bowel sounds present Extremities: No deformity; full range of motion; pulses normal Neurologic: Awake, alert and oriented; motor function intact in all extremities and symmetric; no facial droop Skin: Warm and dry Psychiatric: Normal mood and affect   RESULTS  Summary of this visit's results, reviewed and interpreted by myself:   EKG Interpretation  Date/Time:    Ventricular Rate:    PR Interval:    QRS Duration:   QT Interval:    QTC Calculation:   R Axis:     Text Interpretation:        Laboratory Studies: No results found for this or any previous visit (from the past 24 hour(s)). Imaging Studies: No results found.  ED COURSE and MDM  Nursing notes, initial and subsequent vitals signs, including pulse oximetry, reviewed and interpreted by myself.  Vitals:   04/15/20 2230 04/16/20 0028  BP: 127/70 (!) 138/91  Pulse: 76 72  Resp: 18 16  Temp: 98.5 F (36.9 C)   TempSrc: Oral   SpO2: 97% 100%  Weight: (!) 101.2 kg   Height: 5\' 4"  (1.626 m)    Medications  ketorolac (TORADOL) 30 MG/ML injection 30 mg (has no administration in time range)    We will prescribe 800 mg ibuprofen for the patient.  She was advised she may benefit from a PPI while taking ibuprofen to prevent stomach upset.  PROCEDURES  Procedures   ED DIAGNOSES     ICD-10-CM   1. Chronic pelvic pain in female  R10.2    G89.29        , MD 04/16/20 [phone]

## 2020-04-16 NOTE — ED Notes (Signed)
UA and UC sent to lab 

## 2020-05-03 ENCOUNTER — Other Ambulatory Visit: Payer: Self-pay

## 2020-05-03 ENCOUNTER — Emergency Department (HOSPITAL_BASED_OUTPATIENT_CLINIC_OR_DEPARTMENT_OTHER)
Admission: EM | Admit: 2020-05-03 | Discharge: 2020-05-03 | Disposition: A | Discharge status: Home / Self Care | Payer: Medicaid Other | Attending: Emergency Medicine | Admitting: Emergency Medicine

## 2020-05-03 ENCOUNTER — Encounter (HOSPITAL_BASED_OUTPATIENT_CLINIC_OR_DEPARTMENT_OTHER): Payer: Self-pay

## 2020-05-03 DIAGNOSIS — Z5321 Procedure and treatment not carried out due to patient leaving prior to being seen by health care provider: Secondary | ICD-10-CM | POA: Insufficient documentation

## 2020-05-03 DIAGNOSIS — R509 Fever, unspecified: Secondary | ICD-10-CM | POA: Insufficient documentation

## 2020-05-03 LAB — CBC WITH DIFFERENTIAL/PLATELET
Abs Immature Granulocytes: 0.18 10*3/uL — ABNORMAL HIGH (ref 0.00–0.07)
Basophils Absolute: 0.1 10*3/uL (ref 0.0–0.1)
Basophils Relative: 0 %
Eosinophils Absolute: 0.4 10*3/uL (ref 0.0–0.5)
Eosinophils Relative: 3 %
HCT: 28.7 % — ABNORMAL LOW (ref 36.0–46.0)
Hemoglobin: 10.2 g/dL — ABNORMAL LOW (ref 12.0–15.0)
Immature Granulocytes: 1 %
Lymphocytes Relative: 18 %
Lymphs Abs: 2.6 10*3/uL (ref 0.7–4.0)
MCH: 32.6 pg (ref 26.0–34.0)
MCHC: 35.5 g/dL (ref 30.0–36.0)
MCV: 91.7 fL (ref 80.0–100.0)
Monocytes Absolute: 0.8 10*3/uL (ref 0.1–1.0)
Monocytes Relative: 6 %
Neutro Abs: 10.3 10*3/uL — ABNORMAL HIGH (ref 1.7–7.7)
Neutrophils Relative %: 72 %
Platelets: 265 10*3/uL (ref 150–400)
RBC: 3.13 MIL/uL — ABNORMAL LOW (ref 3.87–5.11)
RDW: 12.7 % (ref 11.5–15.5)
WBC: 14.4 10*3/uL — ABNORMAL HIGH (ref 4.0–10.5)
nRBC: 0 % (ref 0.0–0.2)

## 2020-05-03 LAB — URINALYSIS, ROUTINE W REFLEX MICROSCOPIC
Bilirubin Urine: NEGATIVE
Glucose, UA: NEGATIVE mg/dL
Hgb urine dipstick: NEGATIVE
Ketones, ur: NEGATIVE mg/dL
Nitrite: NEGATIVE
Protein, ur: NEGATIVE mg/dL
Specific Gravity, Urine: 1.02 (ref 1.005–1.030)
pH: 6.5 (ref 5.0–8.0)

## 2020-05-03 LAB — URINALYSIS, MICROSCOPIC (REFLEX): RBC / HPF: NONE SEEN RBC/hpf (ref 0–5)

## 2020-05-03 LAB — BASIC METABOLIC PANEL
Anion gap: 10 (ref 5–15)
BUN: 12 mg/dL (ref 6–20)
CO2: 21 mmol/L — ABNORMAL LOW (ref 22–32)
Calcium: 8.8 mg/dL — ABNORMAL LOW (ref 8.9–10.3)
Chloride: 104 mmol/L (ref 98–111)
Creatinine, Ser: 0.88 mg/dL (ref 0.44–1.00)
GFR calc Af Amer: >60 mL/min (ref >60)
GFR calc non Af Amer: >60 mL/min (ref >60)
Glucose, Bld: 189 mg/dL — ABNORMAL HIGH (ref 70–99)
Potassium: 3.4 mmol/L — ABNORMAL LOW (ref 3.5–5.1)
Sodium: 135 mmol/L (ref 135–145)

## 2020-05-03 MED ORDER — ACETAMINOPHEN 325 MG PO TABS
650.0000 mg | ORAL_TABLET | Freq: Once | ORAL | Status: AC
  Administered 2020-05-03: 650 mg via ORAL
  Filled 2020-05-03: qty 2

## 2020-05-03 NOTE — ED Triage Notes (Addendum)
Pt reports fever x today-last dose motrin 800mg  at 630pm-states she had hysterectomy 8/11 at [facility] triage in w/c-pt added c/o of red scattered spots to right forearm

## 2020-05-19 ENCOUNTER — Other Ambulatory Visit: Payer: Self-pay

## 2020-05-19 ENCOUNTER — Encounter (HOSPITAL_BASED_OUTPATIENT_CLINIC_OR_DEPARTMENT_OTHER): Payer: Self-pay

## 2020-05-19 DIAGNOSIS — N3289 Other specified disorders of bladder: Secondary | ICD-10-CM | POA: Diagnosis not present

## 2020-05-19 DIAGNOSIS — R109 Unspecified abdominal pain: Secondary | ICD-10-CM | POA: Diagnosis not present

## 2020-05-19 DIAGNOSIS — F17210 Nicotine dependence, cigarettes, uncomplicated: Secondary | ICD-10-CM | POA: Insufficient documentation

## 2020-05-19 DIAGNOSIS — R339 Retention of urine, unspecified: Secondary | ICD-10-CM | POA: Diagnosis present

## 2020-05-19 NOTE — ED Triage Notes (Addendum)
Pt c/o difficult urination "I have to strain to go-burning in that area"-states she had recent hysterectomy and has an abd hematoma postop-seen by GYN 8/30 for both c/o-plan was to f/u with radiology to have hematoma drained-also states she had UA done-no rx abx-NAD-steady gait

## 2020-05-20 ENCOUNTER — Emergency Department (HOSPITAL_BASED_OUTPATIENT_CLINIC_OR_DEPARTMENT_OTHER)
Admission: EM | Admit: 2020-05-20 | Discharge: 2020-05-20 | Disposition: A | Discharge status: Home / Self Care | Payer: Medicaid Other | Attending: Emergency Medicine | Admitting: Emergency Medicine

## 2020-05-20 ENCOUNTER — Emergency Department (HOSPITAL_BASED_OUTPATIENT_CLINIC_OR_DEPARTMENT_OTHER): Payer: Medicaid Other

## 2020-05-20 DIAGNOSIS — N3289 Other specified disorders of bladder: Secondary | ICD-10-CM

## 2020-05-20 LAB — URINALYSIS, ROUTINE W REFLEX MICROSCOPIC
Bilirubin Urine: NEGATIVE
Glucose, UA: NEGATIVE mg/dL
Hgb urine dipstick: NEGATIVE
Ketones, ur: NEGATIVE mg/dL
Leukocytes,Ua: NEGATIVE
Nitrite: NEGATIVE
Protein, ur: NEGATIVE mg/dL
Specific Gravity, Urine: 1.01 (ref 1.005–1.030)
pH: 7.5 (ref 5.0–8.0)

## 2020-05-20 MED ORDER — BELLADONNA ALKALOIDS-OPIUM 16.2-30 MG RE SUPP: 1.0000 | Freq: Three times a day (TID) | RECTAL | 0 refills | Status: DC | PRN

## 2020-05-20 MED ORDER — IBUPROFEN 800 MG PO TABS
800.0000 mg | ORAL_TABLET | Freq: Once | ORAL | Status: AC
  Administered 2020-05-20: 800 mg via ORAL
  Filled 2020-05-20: qty 1

## 2020-05-20 MED ORDER — PHENAZOPYRIDINE HCL 100 MG PO TABS
200.0000 mg | ORAL_TABLET | Freq: Once | ORAL | Status: AC
  Administered 2020-05-20: 200 mg via ORAL
  Filled 2020-05-20: qty 2

## 2020-05-20 MED ORDER — ACETAMINOPHEN 500 MG PO TABS
1000.0000 mg | ORAL_TABLET | Freq: Once | ORAL | Status: AC
  Administered 2020-05-20: 1000 mg via ORAL
  Filled 2020-05-20: qty 2

## 2020-05-20 NOTE — ED Provider Notes (Signed)
[facility] EMERGENCY DEPARTMENT Provider Note   CSN: [id number] Arrival date & time: 05/19/20  2219     History Chief Complaint  Patient presents with   Urinary Retention    Katie Spencer is a 39 y.o. female.  Patient presents to the emergency department because she has a sensation of needing to urinate but cannot pass any urine.  Patient reports that she recently had a hysterectomy and has had complications.  She has a hematoma in her pelvis.  She was seen by her OB/GYN on 8/28 and is being sent to interventional radiology for possible drainage.        Past Medical History:  Diagnosis Date   Abnormal vaginal bleeding    Chronic abdominal pain     There are no problems to display for this patient.   Past Surgical History:  Procedure Laterality Date   ABDOMINAL HYSTERECTOMY     CESAREAN SECTION  2006, 2009, 2013, 2014     OB History   No obstetric history on file.     No family history on file.  Social History   Tobacco Use   Smoking status: Current Every Day Smoker    Packs/day: 1.00    Types: Cigarettes   Smokeless tobacco: Never Used  Vaping Use   Vaping Use: Never used  Substance Use Topics   Alcohol use: Not Currently   Drug use: Never    Home Medications Prior to Admission medications   Medication Sig Start Date End Date Taking? Authorizing Provider  belladonna-opium (B&O SUPPRETTES) 16.2-30 MG suppository Place 1 suppository rectally every 8 (eight) hours as needed for pain. 05/20/20   Gilda Crease, MD  cephALEXin (KEFLEX) 500 MG capsule Take 2 capsules (1,000 mg total) by mouth 2 (two) times daily. 04/12/20   Cathren Laine, MD  citalopram (CELEXA) 40 MG tablet Take 40 mg by mouth daily. 01/01/20   [provider]  docusate sodium (COLACE) 100 MG capsule  04/30/20   [provider]  hydrOXYzine (ATARAX/VISTARIL) 25 MG tablet Take 25 mg by mouth 3 (three) times daily as needed. 04/30/20   [provider]  ibuprofen (ADVIL) 800 MG tablet Take 1 tablet (800 mg total) by mouth every 8 (eight) hours as needed (for pain). 04/16/20   Molpus, John, MD  lansoprazole (PREVACID) 30 MG capsule Take 1 capsule daily while taking ibuprofen. 04/16/20   Molpus, John, MD  oxyCODONE-acetaminophen (PERCOCET/ROXICET) 5-325 MG tablet Take 1-2 tablets by mouth every 6 (six) hours as needed for severe pain. 04/12/20   Cathren Laine, MD  phentermine (ADIPEX-P) 37.5 MG tablet Take by mouth. 12/25/19 01/24/20  [provider]  PROAIR HFA 108 (90 Base) MCG/ACT inhaler Inhale 2 puffs into the lungs every 4 (four) hours as needed. 04/30/20   [provider]  tranexamic acid (LYSTEDA) 650 MG TABS tablet Take 1,300 mg by mouth 3 (three) times daily. 01/01/20   [provider]  VRAYLAR 6 MG CAPS Take 1 capsule by mouth daily. 01/02/20   [provider]    Allergies    Patient has no known allergies.  Review of Systems   Review of Systems  Gastrointestinal: Positive for abdominal pain.  Genitourinary: Positive for decreased urine volume and urgency.  All other systems reviewed and are negative.   Physical Exam Updated Vital Signs BP 123/63 (BP Location: Right Arm)    Pulse 68    Temp 98.4 F (36.9 C) (Oral)    Resp 14  LMP 04/04/2020    SpO2 100%   Physical Exam Vitals and nursing note reviewed.  Constitutional:      General: She is not in acute distress.    Appearance: Normal appearance. She is well-developed.  HENT:     Head: Normocephalic and atraumatic.     Right Ear: Hearing normal.     Left Ear: Hearing normal.     Nose: Nose normal.  Eyes:     Conjunctiva/sclera: Conjunctivae normal.     Pupils: Pupils are equal, round, and reactive to light.  Cardiovascular:     Rate and Rhythm: Regular rhythm.     Heart sounds: S1 normal and S2 normal. No murmur heard.  No friction rub. No gallop.   Pulmonary:     Effort: Pulmonary effort is normal. No respiratory  distress.     Breath sounds: Normal breath sounds.  Chest:     Chest wall: No tenderness.  Abdominal:     General: Bowel sounds are normal.     Palpations: Abdomen is soft.     Tenderness: There is no abdominal tenderness. There is no guarding or rebound. Negative signs include Murphy's sign and McBurney's sign.     Hernia: No hernia is present.    Musculoskeletal:        General: Normal range of motion.     Cervical back: Normal range of motion and neck supple.  Skin:    General: Skin is warm and dry.     Findings: No rash.  Neurological:     Mental Status: She is alert and oriented to person, place, and time.     GCS: GCS eye subscore is 4. GCS verbal subscore is 5. GCS motor subscore is 6.     Cranial Nerves: No cranial nerve deficit.     Sensory: No sensory deficit.     Coordination: Coordination normal.  Psychiatric:        Speech: Speech normal.        Behavior: Behavior normal.        Thought Content: Thought content normal.     ED Results / Procedures / Treatments   Labs (all labs ordered are listed, but only abnormal results are displayed) Labs Reviewed  URINALYSIS, ROUTINE W REFLEX MICROSCOPIC - Abnormal; Notable for the following components:      Result Value   APPearance HAZY (*)    All other components within normal limits    EKG None  Radiology CT RENAL STONE STUDY  Result Date: 05/20/2020 CLINICAL DATA:  Patient reports "recent hysterectomy" with difficulty urinating. EXAM: CT ABDOMEN AND PELVIS WITHOUT CONTRAST TECHNIQUE: Multidetector CT imaging of the abdomen and pelvis was performed following the standard protocol without IV contrast. COMPARISON:  [facility] CT abdomen/pelvis exam from 05/15/2020. FINDINGS: Lower chest: Unremarkable. Hepatobiliary: Liver measures 23.2 cm craniocaudal length. The liver shows diffusely decreased attenuation suggesting fat deposition. 8 mm calcified gallstone noted. No intrahepatic or extrahepatic biliary  dilation. Pancreas: No focal mass lesion. No dilatation of the main duct. No intraparenchymal cyst. No peripancreatic edema. Spleen: No splenomegaly. No focal mass lesion. Adrenals/Urinary Tract: No adrenal nodule or mass. 2 mm nonobstructing stone noted lower interpolar right kidney left kidney unremarkable. No evidence for hydroureter. The urinary bladder appears normal for the degree of distention. Stomach/Bowel: Stomach is unremarkable. No gastric wall thickening. No evidence of outlet obstruction. Duodenum is normally positioned as is the ligament of Treitz. No small bowel wall thickening. No small bowel dilatation. The terminal  ileum is normal. The appendix is normal. No gross colonic mass. No colonic wall thickening. Vascular/Lymphatic: No abdominal aortic aneurysm. No abdominal aortic atherosclerotic calcification. There is no gastrohepatic or hepatoduodenal ligament lymphadenopathy. No retroperitoneal or mesenteric lymphadenopathy. No pelvic sidewall lymphadenopathy. Reproductive: The uterus is surgically absent. There is no adnexal mass. Other: No intraperitoneal free fluid. Musculoskeletal: No substantial change in the 6.8 x 5.8 cm (7.5 x 6.0 cm previously) well-defined relatively homogeneous hyperattenuating lesion in the extraperitoneal anterior right pelvis. Imaging features most suggestive of hematoma. A second subcutaneous collection in the lower anterior right abdominal wall measures 3.3 x 1.7 cm today compared to 3.5 x 1.8 cm previously. IMPRESSION: 1. No substantial change in the 6.8 x 5.8 cm well-defined relatively homogeneous hyperattenuating lesion in the extraperitoneal anterior right pelvis. Imaging features most suggestive of hematoma. This generates mass-effect on the urinary bladder. 2. A second subcutaneous collection in the lower anterior right abdominal wall measures 3.3 x 1.7 cm today compared to 3.5 x 1.8 cm previously. 3. Hepatomegaly with hepatic steatosis. 4. Cholelithiasis. 5. 2  mm nonobstructing right renal stone. Electronically Signed   By: Kennith Center M.D.   On: 05/20/2020 05:41    Procedures Procedures (including critical care time)  Medications Ordered in ED Medications  acetaminophen (TYLENOL) tablet 1,000 mg (1,000 mg Oral Given 05/20/20 0504)  ibuprofen (ADVIL) tablet 800 mg (800 mg Oral Given 05/20/20 0504)  phenazopyridine (PYRIDIUM) tablet 200 mg (200 mg Oral Given 05/20/20 9470)    ED Course  I have reviewed the triage vital signs and the nursing notes.  Pertinent labs & imaging results that were available during my care of the patient were reviewed by me and considered in my medical decision making (see chart for details).    MDM Rules/Calculators/A&P                          Patient presents to the emergency department with difficulty passing her urine.  Patient has a known pelvic hematoma secondary to recent hysterectomy.  Reviewing the report from [facility], this hematoma does press on the bladder and is likely causing spasm of the bladder.  She does not have evidence of urinary tract infection.  Repeat CT does not show any significant change in the size of the hematoma.  Patient will be discharged with bladder spasm agents and is to follow-up with interventional radiology as is being arranged by her OB/GYN doctor.  Final Clinical Impression(s) / ED Diagnoses Final diagnoses:  Bladder spasms    Rx / DC Orders ED Discharge Orders         Ordered    belladonna-opium (B&O SUPPRETTES) 16.2-30 MG suppository  Every 8 hours PRN        05/20/20 0555           Gilda Crease, MD 05/20/20 [phone]

## 2020-05-20 NOTE — Discharge Instructions (Signed)
The hematoma in your pelvis is pressing on your bladder causing it to become irritated.  This is what is making you feel like you need to urinate when there is no urine in your bladder.

## 2020-05-20 NOTE — ED Notes (Signed)
Preformed bladder scan x3 with the amount as 0 ml

## 2020-05-25 ENCOUNTER — Encounter (HOSPITAL_COMMUNITY): Payer: Self-pay | Admitting: Radiology

## 2020-05-25 ENCOUNTER — Other Ambulatory Visit (HOSPITAL_COMMUNITY): Payer: Self-pay | Admitting: Obstetrics and Gynecology

## 2020-05-25 ENCOUNTER — Telehealth (HOSPITAL_COMMUNITY): Payer: Self-pay

## 2020-05-25 DIAGNOSIS — N9489 Other specified conditions associated with female genital organs and menstrual cycle: Secondary | ICD-10-CM

## 2020-05-25 NOTE — Telephone Encounter (Signed)
-----   Message from Richarda Overlie, MD sent at 05/25/2020 11:34 AM EDT ----- Regarding: RE: Possible Drain CT guided aspiration.  Probably no drain placement.   [place]enn ----- Message ----- From: Anderson Malta Sent: 05/25/2020  10:10 AM EDT To: Ir Procedure Requests Subject: Possible Drain                                 Procedure: Eval and possible drainage  Dx: pelvic hematoma s/p abdominal hysterectomy 04/28/20 at [facility]  Ordering: Dr. Danton Sewer - [facility] [phone]  Imaging: CT abd/pelvis done 05/15/20 - In inteleviewer  Please review.   Thanks, Fara Boros

## 2020-05-25 NOTE — Progress Notes (Signed)
[facility] Female, 39 y.o., 13-Mar-1981 MRN:  [id number] Phone:  [phone] (H) PCP:  Loyal Jacobson, MD Coverage:  [place] [employer]/[place] [employer] FW: Possible Drain Received: Today Rich Brave, can you schedule this? I'm going to put the order in now.       Previous Messages   ----- Message -----  From: Richarda Overlie, MD  Sent: 05/25/2020 11:34 AM EDT  To: Anderson Malta  Subject: RE: Possible Drain                CT guided aspiration. Probably no drain placement.   [place]enn  ----- Message -----  From: Anderson Malta  Sent: 05/25/2020 10:10 AM EDT  To: Ir Procedure Requests  Subject: Possible Drain                  Procedure: Eval and possible drainage   Dx: pelvic hematoma s/p abdominal hysterectomy 04/28/20 at [facility]   Ordering: Dr. Danton Sewer - [facility] [phone]   Imaging: CT abd/pelvis done 05/15/20 - In inteleviewer   Please review.   Thanks,  Fara Boros

## 2020-06-02 ENCOUNTER — Other Ambulatory Visit: Payer: Self-pay | Admitting: Radiology

## 2020-06-02 ENCOUNTER — Other Ambulatory Visit: Payer: Self-pay | Admitting: Physician Assistant

## 2020-06-03 ENCOUNTER — Ambulatory Visit (HOSPITAL_COMMUNITY)
Admission: RE | Admit: 2020-06-03 | Discharge: 2020-06-03 | Disposition: A | Discharge status: Home / Self Care | Payer: Medicaid Other | Source: Ambulatory Visit | Attending: Obstetrics and Gynecology | Admitting: Obstetrics and Gynecology

## 2020-06-03 ENCOUNTER — Encounter (HOSPITAL_COMMUNITY): Payer: Self-pay

## 2020-06-14 ENCOUNTER — Other Ambulatory Visit: Payer: Self-pay

## 2020-06-14 ENCOUNTER — Encounter (HOSPITAL_BASED_OUTPATIENT_CLINIC_OR_DEPARTMENT_OTHER): Payer: Self-pay | Admitting: *Deleted

## 2020-06-14 DIAGNOSIS — R1011 Right upper quadrant pain: Secondary | ICD-10-CM | POA: Insufficient documentation

## 2020-06-14 DIAGNOSIS — F17210 Nicotine dependence, cigarettes, uncomplicated: Secondary | ICD-10-CM | POA: Diagnosis not present

## 2020-06-14 NOTE — ED Notes (Addendum)
EMS called poison control. Poison Control had no information about [employer]. Per EMS poison control had not advice for treatment.

## 2020-06-14 NOTE — ED Triage Notes (Addendum)
Brought in by EMs form home , pt reports poisoning by fish tank coral x 3 hrs ago , c/o dizziness , pt reports EMS notified poison control

## 2020-06-15 ENCOUNTER — Emergency Department (HOSPITAL_BASED_OUTPATIENT_CLINIC_OR_DEPARTMENT_OTHER)
Admission: EM | Admit: 2020-06-15 | Discharge: 2020-06-15 | Disposition: A | Discharge status: Home / Self Care | Payer: Medicaid Other | Attending: Emergency Medicine | Admitting: Emergency Medicine

## 2020-06-15 ENCOUNTER — Encounter (HOSPITAL_BASED_OUTPATIENT_CLINIC_OR_DEPARTMENT_OTHER): Payer: Self-pay | Admitting: Radiology

## 2020-06-15 ENCOUNTER — Emergency Department (HOSPITAL_BASED_OUTPATIENT_CLINIC_OR_DEPARTMENT_OTHER): Payer: Medicaid Other

## 2020-06-15 DIAGNOSIS — R1011 Right upper quadrant pain: Secondary | ICD-10-CM

## 2020-06-15 LAB — COMPREHENSIVE METABOLIC PANEL
ALT: 28 U/L (ref 0–44)
AST: 26 U/L (ref 15–41)
Albumin: 4.1 g/dL (ref 3.5–5.0)
Alkaline Phosphatase: 82 U/L (ref 38–126)
Anion gap: 12 (ref 5–15)
BUN: 11 mg/dL (ref 6–20)
CO2: 22 mmol/L (ref 22–32)
Calcium: 9 mg/dL (ref 8.9–10.3)
Chloride: 103 mmol/L (ref 98–111)
Creatinine, Ser: 0.57 mg/dL (ref 0.44–1.00)
GFR calc Af Amer: >60 mL/min (ref >60)
GFR calc non Af Amer: >60 mL/min (ref >60)
Glucose, Bld: 141 mg/dL — ABNORMAL HIGH (ref 70–99)
Potassium: 3.5 mmol/L (ref 3.5–5.1)
Sodium: 137 mmol/L (ref 135–145)
Total Bilirubin: 0.5 mg/dL (ref 0.3–1.2)
Total Protein: 7.3 g/dL (ref 6.5–8.1)

## 2020-06-15 LAB — CBC WITH DIFFERENTIAL/PLATELET
Abs Immature Granulocytes: 0.05 10*3/uL (ref 0.00–0.07)
Basophils Absolute: 0.1 10*3/uL (ref 0.0–0.1)
Basophils Relative: 1 %
Eosinophils Absolute: 0.9 10*3/uL — ABNORMAL HIGH (ref 0.0–0.5)
Eosinophils Relative: 8 %
HCT: 35.7 % — ABNORMAL LOW (ref 36.0–46.0)
Hemoglobin: 12.4 g/dL (ref 12.0–15.0)
Immature Granulocytes: 1 %
Lymphocytes Relative: 32 %
Lymphs Abs: 3.3 10*3/uL (ref 0.7–4.0)
MCH: 32.2 pg (ref 26.0–34.0)
MCHC: 34.7 g/dL (ref 30.0–36.0)
MCV: 92.7 fL (ref 80.0–100.0)
Monocytes Absolute: 0.6 10*3/uL (ref 0.1–1.0)
Monocytes Relative: 6 %
Neutro Abs: 5.6 10*3/uL (ref 1.7–7.7)
Neutrophils Relative %: 52 %
Platelets: 262 10*3/uL (ref 150–400)
RBC: 3.85 MIL/uL — ABNORMAL LOW (ref 3.87–5.11)
RDW: 12.3 % (ref 11.5–15.5)
WBC: 10.5 10*3/uL (ref 4.0–10.5)
nRBC: 0 % (ref 0.0–0.2)

## 2020-06-15 LAB — LACTIC ACID, PLASMA: Lactic Acid, Venous: 1.4 mmol/L (ref 0.5–1.9)

## 2020-06-15 MED ORDER — FENTANYL CITRATE (PF) 100 MCG/2ML IJ SOLN
100.0000 ug | Freq: Once | INTRAMUSCULAR | Status: AC
  Administered 2020-06-15: 100 ug via INTRAVENOUS
  Filled 2020-06-15: qty 2

## 2020-06-15 MED ORDER — IOHEXOL 300 MG/ML  SOLN
100.0000 mL | Freq: Once | INTRAMUSCULAR | Status: AC | PRN
  Administered 2020-06-15: 100 mL via INTRAVENOUS

## 2020-06-15 NOTE — ED Provider Notes (Signed)
**Note Katie-Identified via Obfuscation** [facility] EMERGENCY DEPARTMENT Provider Note   CSN: [id number] Arrival date & time: 06/14/20  2231     History Chief Complaint  Patient presents with   Poisoning    Katie Spencer is a 39 y.o. female.  Patient is here with several complaints.  She states that she feels "drunk" and nauseated and is having right-sided abdominal and back pain.  She is status post 2 to 3 weeks cholecystectomy and 4 weeks hysterectomy.  She states that one of her incision sites became infected and she was on antibiotics, but that it is doing better now.  Additionally, patient states that she believes her symptoms are caused by coming in contact with a poisonous coral, which she has in her salt water fish tank.  She states that she handled the coral and then ate some food.  The history is provided by the patient. No language interpreter was used.       Past Medical History:  Diagnosis Date   Abnormal vaginal bleeding    Chronic abdominal pain     There are no problems to display for this patient.   Past Surgical History:  Procedure Laterality Date   ABDOMINAL HYSTERECTOMY     CESAREAN SECTION  2006, 2009, 2013, 2014     OB History   No obstetric history on file.     No family history on file.  Social History   Tobacco Use   Smoking status: Current Every Day Smoker    Packs/day: 1.00    Types: Cigarettes   Smokeless tobacco: Never Used  Vaping Use   Vaping Use: Never used  Substance Use Topics   Alcohol use: Not Currently   Drug use: Never    Home Medications Prior to Admission medications   Medication Sig Start Date End Date Taking? Authorizing Provider  acetaminophen (TYLENOL) 500 MG tablet Take 1,000 mg by mouth every 6 (six) hours as needed for moderate pain or headache.    [provider]  belladonna-opium (B&O SUPPRETTES) 16.2-30 MG suppository Place 1 suppository rectally every 8 (eight) hours as needed for pain. Patient not taking:  Reported on 05/27/2020 05/20/20   Gilda Crease, MD  cephALEXin (KEFLEX) 500 MG capsule Take 2 capsules (1,000 mg total) by mouth 2 (two) times daily. Patient not taking: Reported on 05/27/2020 04/12/20   Cathren Laine, MD  citalopram (CELEXA) 40 MG tablet Take 40 mg by mouth daily. 01/01/20   [provider]  docusate sodium (COLACE) 100 MG capsule Take 100 mg by mouth 2 (two) times daily.  04/30/20   [provider]  hydrOXYzine (ATARAX/VISTARIL) 25 MG tablet Take 25 mg by mouth 3 (three) times daily as needed for anxiety.  04/30/20   [provider]  ibuprofen (ADVIL) 800 MG tablet Take 1 tablet (800 mg total) by mouth every 8 (eight) hours as needed (for pain). 04/16/20   Molpus, John, MD  lansoprazole (PREVACID) 30 MG capsule Take 1 capsule daily while taking ibuprofen. Patient not taking: Reported on 05/27/2020 04/16/20   Molpus, Jonny Ruiz, MD  oxyCODONE-acetaminophen (PERCOCET/ROXICET) 5-325 MG tablet Take 1-2 tablets by mouth every 6 (six) hours as needed for severe pain. Patient not taking: Reported on 05/27/2020 04/12/20   Cathren Laine, MD  PROAIR HFA 108 [phone] Base) MCG/ACT inhaler Inhale 2 puffs into the lungs every 4 (four) hours as needed for wheezing or shortness of breath.  04/30/20   [provider]  VRAYLAR 6 MG CAPS Take 6 mg by mouth  at bedtime.  01/02/20   [provider]    Allergies    Patient has no known allergies.  Review of Systems   Review of Systems  All other systems reviewed and are negative.   Physical Exam Updated Vital Signs BP 130/85 (BP Location: Right Arm)    Pulse (!) 59    Temp 99 F (37.2 C) (Oral)    Resp 18    Ht 5\' 4"  (1.626 m)    Wt 102.1 kg    LMP 04/04/2020    SpO2 100%    BMI 38.62 kg/m   Physical Exam Vitals and nursing note reviewed.  Constitutional:      General: She is not in acute distress.    Appearance: She is well-developed.  HENT:     Head: Normocephalic and atraumatic.  Eyes:      Conjunctiva/sclera: Conjunctivae normal.  Cardiovascular:     Rate and Rhythm: Normal rate and regular rhythm.     Heart sounds: No murmur heard.   Pulmonary:     Effort: Pulmonary effort is normal. No respiratory distress.     Breath sounds: Normal breath sounds.  Abdominal:     Palpations: Abdomen is soft.     Tenderness: There is no abdominal tenderness.     Comments: Healing surgical sites, wounds are clean dry and intact, left lower abdominal tenderness and right upper abdominal tenderness  Musculoskeletal:        General: Normal range of motion.     Cervical back: Neck supple.  Skin:    General: Skin is warm and dry.  Neurological:     Mental Status: She is alert and oriented to person, place, and time.  Psychiatric:        Mood and Affect: Mood normal.        Behavior: Behavior normal.     ED Results / Procedures / Treatments   Labs (all labs ordered are listed, but only abnormal results are displayed) Labs Reviewed  CBC WITH DIFFERENTIAL/PLATELET - Abnormal; Notable for the following components:      Result Value   RBC 3.85 (*)    HCT 35.7 (*)    Eosinophils Absolute 0.9 (*)    All other components within normal limits  COMPREHENSIVE METABOLIC PANEL  LACTIC ACID, PLASMA  URINALYSIS, ROUTINE W REFLEX MICROSCOPIC    EKG None  Radiology No results found.  Procedures Procedures (including critical care time)  Medications Ordered in ED Medications  fentaNYL (SUBLIMAZE) injection 100 mcg (100 mcg Intravenous Given 06/15/20 0201)    ED Course  I have reviewed the triage vital signs and the nursing notes.  Pertinent labs & imaging results that were available during my care of the patient were reviewed by me and considered in my medical decision making (see chart for details).    MDM Rules/Calculators/A&P                          This patient complains of of questionable poisoning from coral in her fish tank.  She also complains of some right-sided  abdominal pain and is status post 2 to 3 weeks cholecystectomy, this involves an extensive number of treatment options, and is a complaint that carries with it a high risk of complications and morbidity.    Differential Dx Questionable poisoning, postoperative pain, infection  Pertinent Labs I ordered, reviewed, and interpreted labs, which included CBC, CMP and lactic acid which are reassuring, no leukocytosis, no  elevated lactate, LFTs are normal  Imaging Interpretation I ordered imaging studies which included CT abdomen/pelvis which showed postsurgical changes and small fluid collection in the gallbladder fossa which could be postoperative changes versus abscess.  Based on reassuring labs, and normal vitals, I think abscess is less likely.  Have encouraged the patient to discuss these results with her doctor.  She will call her surgeon tomorrow.   Medications I ordered medication fentanyl for pain.  Sources Previous records obtained and reviewed.   Critical Interventions  None  Reassessments After the interventions stated above, I reevaluated the patient and found stable for discharge and outpatient follow-up.  Consultants None  Plan Discharge    Final Clinical Impression(s) / ED Diagnoses Final diagnoses:  Right upper quadrant abdominal pain    Rx / DC Orders ED Discharge Orders    None       Roxy Horseman, PA-C 06/15/20 0400    Mesner, Barbara Cower, MD 06/15/20 [phone]

## 2020-06-15 NOTE — ED Notes (Signed)
Patient transported to CT 

## 2020-06-15 NOTE — Discharge Instructions (Addendum)
Regarding the possible poisoning from your coral exposure, I do not think that any additional work-up or treatment is indicated tonight.  Your vital signs are reassuring.  Your lab tests are also reassuring.  Your CT scan showed a small fluid collection near where your gallbladder was located.  It is possible that this could be normal postoperative fluid versus infection, but if it were infection I would expect your white blood cell count to be high and you to be running a fever.  I recommend that you contact your surgeon in the morning and discussed this finding with them.

## 2020-07-06 ENCOUNTER — Emergency Department (HOSPITAL_BASED_OUTPATIENT_CLINIC_OR_DEPARTMENT_OTHER): Payer: Medicaid Other

## 2020-07-06 ENCOUNTER — Other Ambulatory Visit: Payer: Self-pay

## 2020-07-06 ENCOUNTER — Encounter (HOSPITAL_BASED_OUTPATIENT_CLINIC_OR_DEPARTMENT_OTHER): Payer: Self-pay | Admitting: Emergency Medicine

## 2020-07-06 ENCOUNTER — Emergency Department (HOSPITAL_BASED_OUTPATIENT_CLINIC_OR_DEPARTMENT_OTHER)
Admission: EM | Admit: 2020-07-06 | Discharge: 2020-07-06 | Disposition: A | Discharge status: Home / Self Care | Payer: Medicaid Other | Attending: Emergency Medicine | Admitting: Emergency Medicine

## 2020-07-06 DIAGNOSIS — L03311 Cellulitis of abdominal wall: Secondary | ICD-10-CM | POA: Insufficient documentation

## 2020-07-06 DIAGNOSIS — R1084 Generalized abdominal pain: Secondary | ICD-10-CM | POA: Insufficient documentation

## 2020-07-06 DIAGNOSIS — L0390 Cellulitis, unspecified: Secondary | ICD-10-CM

## 2020-07-06 DIAGNOSIS — G8918 Other acute postprocedural pain: Secondary | ICD-10-CM | POA: Diagnosis not present

## 2020-07-06 DIAGNOSIS — F17210 Nicotine dependence, cigarettes, uncomplicated: Secondary | ICD-10-CM | POA: Insufficient documentation

## 2020-07-06 LAB — COMPREHENSIVE METABOLIC PANEL
ALT: 27 U/L (ref 0–44)
AST: 28 U/L (ref 15–41)
Albumin: 4.1 g/dL (ref 3.5–5.0)
Alkaline Phosphatase: 75 U/L (ref 38–126)
Anion gap: 11 (ref 5–15)
BUN: 10 mg/dL (ref 6–20)
CO2: 22 mmol/L (ref 22–32)
Calcium: 8.8 mg/dL — ABNORMAL LOW (ref 8.9–10.3)
Chloride: 104 mmol/L (ref 98–111)
Creatinine, Ser: 0.92 mg/dL (ref 0.44–1.00)
GFR, Estimated: >60 mL/min (ref >60)
Glucose, Bld: 161 mg/dL — ABNORMAL HIGH (ref 70–99)
Potassium: 3.5 mmol/L (ref 3.5–5.1)
Sodium: 137 mmol/L (ref 135–145)
Total Bilirubin: 0.5 mg/dL (ref 0.3–1.2)
Total Protein: 7.2 g/dL (ref 6.5–8.1)

## 2020-07-06 LAB — CBC WITH DIFFERENTIAL/PLATELET
Abs Immature Granulocytes: 0.06 10*3/uL (ref 0.00–0.07)
Basophils Absolute: 0.1 10*3/uL (ref 0.0–0.1)
Basophils Relative: 1 %
Eosinophils Absolute: 0.4 10*3/uL (ref 0.0–0.5)
Eosinophils Relative: 4 %
HCT: 32.7 % — ABNORMAL LOW (ref 36.0–46.0)
Hemoglobin: 11.5 g/dL — ABNORMAL LOW (ref 12.0–15.0)
Immature Granulocytes: 1 %
Lymphocytes Relative: 29 %
Lymphs Abs: 3.1 10*3/uL (ref 0.7–4.0)
MCH: 32.2 pg (ref 26.0–34.0)
MCHC: 35.2 g/dL (ref 30.0–36.0)
MCV: 91.6 fL (ref 80.0–100.0)
Monocytes Absolute: 0.7 10*3/uL (ref 0.1–1.0)
Monocytes Relative: 7 %
Neutro Abs: 6.3 10*3/uL (ref 1.7–7.7)
Neutrophils Relative %: 58 %
Platelets: 238 10*3/uL (ref 150–400)
RBC: 3.57 MIL/uL — ABNORMAL LOW (ref 3.87–5.11)
RDW: 12.4 % (ref 11.5–15.5)
WBC: 10.7 10*3/uL — ABNORMAL HIGH (ref 4.0–10.5)
nRBC: 0 % (ref 0.0–0.2)

## 2020-07-06 LAB — URINALYSIS, ROUTINE W REFLEX MICROSCOPIC
Bilirubin Urine: NEGATIVE
Glucose, UA: NEGATIVE mg/dL
Hgb urine dipstick: NEGATIVE
Ketones, ur: NEGATIVE mg/dL
Leukocytes,Ua: NEGATIVE
Nitrite: NEGATIVE
Protein, ur: NEGATIVE mg/dL
Specific Gravity, Urine: 1.015 (ref 1.005–1.030)
pH: 7.5 (ref 5.0–8.0)

## 2020-07-06 MED ORDER — MUPIROCIN CALCIUM 2 % EX CREA: 1.0000 "application " | TOPICAL_CREAM | Freq: Two times a day (BID) | CUTANEOUS | 0 refills | Status: DC

## 2020-07-06 MED ORDER — HALOPERIDOL LACTATE 5 MG/ML IJ SOLN
2.0000 mg | Freq: Once | INTRAMUSCULAR | Status: AC
  Administered 2020-07-06: 2 mg via INTRAVENOUS
  Filled 2020-07-06: qty 1

## 2020-07-06 MED ORDER — KETOROLAC TROMETHAMINE 30 MG/ML IJ SOLN
30.0000 mg | Freq: Once | INTRAMUSCULAR | Status: AC
  Administered 2020-07-06: 30 mg via INTRAVENOUS
  Filled 2020-07-06: qty 1

## 2020-07-06 MED ORDER — DOXYCYCLINE HYCLATE 100 MG PO CAPS: 100.0000 mg | ORAL_CAPSULE | Freq: Two times a day (BID) | ORAL | 0 refills | Status: DC

## 2020-07-06 MED ORDER — IOHEXOL 300 MG/ML  SOLN
100.0000 mL | Freq: Once | INTRAMUSCULAR | Status: AC | PRN
  Administered 2020-07-06: 100 mL via INTRAVENOUS

## 2020-07-06 MED ORDER — DICYCLOMINE HCL 10 MG PO CAPS
10.0000 mg | ORAL_CAPSULE | Freq: Once | ORAL | Status: AC
  Administered 2020-07-06: 10 mg via ORAL
  Filled 2020-07-06: qty 1

## 2020-07-06 MED ORDER — DOXYCYCLINE HYCLATE 100 MG PO TABS
100.0000 mg | ORAL_TABLET | Freq: Once | ORAL | Status: AC
  Administered 2020-07-06: 100 mg via ORAL
  Filled 2020-07-06: qty 1

## 2020-07-06 NOTE — ED Triage Notes (Signed)
Pt states she had her gallbladder removed four or five weeks ago  Pt states her top incision got infected and she finished her Keflex last night  Pt states she does not think it helped  Pt states tonight she started having sharp stabbing pain in her upper abdomen  Pt states she took pain medication without relief  Pt states her top incision is open

## 2020-07-06 NOTE — Discharge Instructions (Addendum)
Follow up with your surgeons for ongoing care

## 2020-07-06 NOTE — ED Provider Notes (Signed)
[facility] EMERGENCY DEPARTMENT Provider Note   CSN: [id number] Arrival date & time: 07/06/20  0033     History Chief Complaint  Patient presents with  . Post-op Problem    Katie Spencer is a 39 y.o. female.  The history is provided by the patient.  Abdominal Pain Pain location:  Generalized Pain quality: burning   Pain radiates to:  Does not radiate Pain severity:  Moderate Onset quality:  Gradual Timing:  Constant Progression:  Worsening Chronicity:  Chronic (has chronic abdominal pain and has had worsening pain since hysterectomy 2 months ago and GB removal 5 weeks ago) Context: not alcohol use   Relieved by:  Nothing Worsened by:  Nothing Ineffective treatments: home narcotics  Associated symptoms: no anorexia, no chest pain and no dysuria   Patient with chronic abdominal pain 9 weeks post op from hysterectomy and 5 weeks post op from GB removal presents with ongoing abdominal pain and her upper trochar incision opened up more than a week ago ans is causing her pain.  She was placed on keflex by [facility] on 10/14 but she is out of this and her home percocet is not working.       Past Medical History:  Diagnosis Date  . Abnormal vaginal bleeding   . Chronic abdominal pain     There are no problems to display for this patient.   Past Surgical History:  Procedure Laterality Date  . ABDOMINAL HYSTERECTOMY    . CESAREAN SECTION  2006, 2009, 2013, 2014  . CHOLECYSTECTOMY       OB History   No obstetric history on file.     History reviewed. No pertinent family history.  Social History   Tobacco Use  . Smoking status: Current Every Day Smoker    Packs/day: 1.00    Types: Cigarettes  . Smokeless tobacco: Never Used  Vaping Use  . Vaping Use: Never used  Substance Use Topics  . Alcohol use: Not Currently  . Drug use: Never    Home Medications Prior to Admission medications   Medication Sig Start Date End Date Taking? Authorizing  Provider  acetaminophen (TYLENOL) 500 MG tablet Take 1,000 mg by mouth every 6 (six) hours as needed for moderate pain or headache.    [provider]  belladonna-opium (B&O SUPPRETTES) 16.2-30 MG suppository Place 1 suppository rectally every 8 (eight) hours as needed for pain. Patient not taking: Reported on 05/27/2020 05/20/20   Gilda Crease, MD  cephALEXin (KEFLEX) 500 MG capsule Take 2 capsules (1,000 mg total) by mouth 2 (two) times daily. Patient not taking: Reported on 05/27/2020 04/12/20   Cathren Laine, MD  citalopram (CELEXA) 40 MG tablet Take 40 mg by mouth daily. 01/01/20   [provider]  docusate sodium (COLACE) 100 MG capsule Take 100 mg by mouth 2 (two) times daily.  04/30/20   [provider]  doxycycline (VIBRAMYCIN) 100 MG capsule Take 1 capsule (100 mg total) by mouth 2 (two) times daily. One po bid x 7 days 07/06/20   Alek Poncedeleon, MD  hydrOXYzine (ATARAX/VISTARIL) 25 MG tablet Take 25 mg by mouth 3 (three) times daily as needed for anxiety.  04/30/20   [provider]  ibuprofen (ADVIL) 800 MG tablet Take 1 tablet (800 mg total) by mouth every 8 (eight) hours as needed (for pain). 04/16/20   Molpus, John, MD  lansoprazole (PREVACID) 30 MG capsule Take 1 capsule daily while taking ibuprofen. Patient not taking: Reported on 05/27/2020  04/16/20   Molpus, John, MD  mupirocin cream (BACTROBAN) 2 % Apply 1 application topically 2 (two) times daily. 07/06/20   Marl Seago, MD  oxyCODONE-acetaminophen (PERCOCET/ROXICET) 5-325 MG tablet Take 1-2 tablets by mouth every 6 (six) hours as needed for severe pain. Patient not taking: Reported on 05/27/2020 04/12/20   Cathren Laine, MD  PROAIR HFA 108 [phone] Base) MCG/ACT inhaler Inhale 2 puffs into the lungs every 4 (four) hours as needed for wheezing or shortness of breath.  04/30/20   [provider]  VRAYLAR 6 MG CAPS Take 6 mg by mouth at bedtime.  01/02/20   [provider]     Allergies    Patient has no known allergies.  Review of Systems   Review of Systems  Constitutional: Negative for unexpected weight change.  HENT: Negative for congestion.   Eyes: Negative for visual disturbance.  Respiratory: Negative for chest tightness.   Cardiovascular: Negative for chest pain.  Gastrointestinal: Positive for abdominal pain. Negative for anorexia.  Genitourinary: Negative for dysuria.  Musculoskeletal: Negative for arthralgias.  Skin: Positive for wound.  Neurological: Negative for dizziness.  Psychiatric/Behavioral: Negative for agitation.    Physical Exam Updated Vital Signs BP 123/67   Pulse 64   Temp 98.6 F (37 C) (Oral)   Resp 18   Ht 5\' 4"  (1.626 m)   Wt 101.2 kg   LMP 04/04/2020   SpO2 99%   BMI 38.28 kg/m   Physical Exam Vitals and nursing note reviewed.  Constitutional:      General: She is not in acute distress.    Appearance: Normal appearance.  HENT:     Head: Normocephalic and atraumatic.     Nose: Nose normal.  Eyes:     Conjunctiva/sclera: Conjunctivae normal.     Pupils: Pupils are equal, round, and reactive to light.  Cardiovascular:     Rate and Rhythm: Normal rate and regular rhythm.     Pulses: Normal pulses.     Heart sounds: Normal heart sounds.  Pulmonary:     Effort: Pulmonary effort is normal.     Breath sounds: Normal breath sounds.  Abdominal:     General: Abdomen is flat. Bowel sounds are normal.     Palpations: Abdomen is soft.     Tenderness: There is no abdominal tenderness. There is no guarding or rebound. Negative signs include Murphy's sign, Rovsing's sign, McBurney's sign and psoas sign.    Musculoskeletal:        General: Normal range of motion.     Cervical back: Normal range of motion and neck supple.  Skin:    General: Skin is warm and dry.     Capillary Refill: Capillary refill takes less than 2 seconds.  Neurological:     General: No focal deficit present.     Mental Status: She is  alert and oriented to person, place, and time.  Psychiatric:        Mood and Affect: Mood normal.        Behavior: Behavior normal.     ED Results / Procedures / Treatments   Labs (all labs ordered are listed, but only abnormal results are displayed) Results for orders placed or performed during the hospital encounter of 07/06/20  CBC with Differential/Platelet  Result Value Ref Range   WBC 10.7 (H) 4.0 - 10.5 K/uL   RBC 3.57 (L) 3.87 - 5.11 MIL/uL   Hemoglobin 11.5 (L) 12.0 - 15.0 g/dL   HCT 07/08/20 (L) 36 -  46 %   MCV 91.6 80.0 - 100.0 fL   MCH 32.2 26.0 - 34.0 pg   MCHC 35.2 30.0 - 36.0 g/dL   RDW 81.0 17.5 - 10.2 %   Platelets 238 150 - 400 K/uL   nRBC 0.0 0.0 - 0.2 %   Neutrophils Relative % 58 %   Neutro Abs 6.3 1.7 - 7.7 K/uL   Lymphocytes Relative 29 %   Lymphs Abs 3.1 0.7 - 4.0 K/uL   Monocytes Relative 7 %   Monocytes Absolute 0.7 0.1 - 1.0 K/uL   Eosinophils Relative 4 %   Eosinophils Absolute 0.4 0.0 - 0.5 K/uL   Basophils Relative 1 %   Basophils Absolute 0.1 0.0 - 0.1 K/uL   Immature Granulocytes 1 %   Abs Immature Granulocytes 0.06 0.00 - 0.07 K/uL  Comprehensive metabolic panel  Result Value Ref Range   Sodium 137 135 - 145 mmol/L   Potassium 3.5 3.5 - 5.1 mmol/L   Chloride 104 98 - 111 mmol/L   CO2 22 22 - 32 mmol/L   Glucose, Bld 161 (H) 70 - 99 mg/dL   BUN 10 6 - 20 mg/dL   Creatinine, Ser 5.85 0.44 - 1.00 mg/dL   Calcium 8.8 (L) 8.9 - 10.3 mg/dL   Total Protein 7.2 6.5 - 8.1 g/dL   Albumin 4.1 3.5 - 5.0 g/dL   AST 28 15 - 41 U/L   ALT 27 0 - 44 U/L   Alkaline Phosphatase 75 38 - 126 U/L   Total Bilirubin 0.5 0.3 - 1.2 mg/dL   GFR, Estimated >27 >78 mL/min   Anion gap 11 5 - 15  Urinalysis, Routine w reflex microscopic  Result Value Ref Range   Color, Urine YELLOW YELLOW   APPearance HAZY (A) CLEAR   Specific Gravity, Urine 1.015 1.005 - 1.030   pH 7.5 5.0 - 8.0   Glucose, UA NEGATIVE NEGATIVE mg/dL   Hgb urine dipstick NEGATIVE NEGATIVE    Bilirubin Urine NEGATIVE NEGATIVE   Ketones, ur NEGATIVE NEGATIVE mg/dL   Protein, ur NEGATIVE NEGATIVE mg/dL   Nitrite NEGATIVE NEGATIVE   Leukocytes,Ua NEGATIVE NEGATIVE   CT ABDOMEN PELVIS W CONTRAST  Result Date: 07/06/2020 CLINICAL DATA:  Acute abdominal pain. Gallbladder removal 4 weeks ago. Infection of the incision. EXAM: CT ABDOMEN AND PELVIS WITH CONTRAST TECHNIQUE: Multidetector CT imaging of the abdomen and pelvis was performed using the standard protocol following bolus administration of intravenous contrast. CONTRAST:  OMNIPAQUE IOHEXOL 300 MG/ML  SOLN COMPARISON:  07/01/2020 FINDINGS: Lower chest: Lung bases are clear. Hepatobiliary: Diffuse fatty infiltration of the liver. No focal liver lesions. Surgical absence of the gallbladder. No bile duct dilatation. Pancreas: Unremarkable. No pancreatic ductal dilatation or surrounding inflammatory changes. Spleen: Normal in size without focal abnormality. Adrenals/Urinary Tract: No adrenal gland nodules. 4 mm stone in the midportion of the right kidney. No hydronephrosis or hydroureter. Bladder is normal. Stomach/Bowel: Stomach, small bowel, and colon are not abnormally distended. Scattered diverticula in the colon. No inflammatory changes. Appendix is normal. Vascular/Lymphatic: No significant vascular findings are present. No enlarged abdominal or pelvic lymph nodes. Reproductive: Status post hysterectomy. No adnexal masses. Other: Areas of infiltration in the subcutaneous fat of the anterior abdominal wall likely representing postoperative changes. Possibly cellulitis. No abscesses. Similar appearance to prior study. Right anterior pelvic circumscribed collection measuring 5.4 cm in diameter. Surrounding infiltration or scarring. Changes likely represent postoperative collection or hematoma. No significant interval change. Musculoskeletal: No acute or significant  osseous findings. IMPRESSION: 1. Diffuse fatty infiltration of the liver. 2. 4  mm nonobstructing stone in the right kidney. 3. Areas of infiltration in the subcutaneous fat of the anterior abdominal wall likely representing postoperative changes. Possibly cellulitis. No abscesses. No change since prior study. 4. Right anterior pelvic circumscribed collection measuring 5.4 cm in diameter with surrounding infiltration or scarring. Changes likely represent postoperative collection or hematoma. No significant interval change. Electronically Signed   By: Burman NievesWilliam  Stevens M.D.   On: 07/06/2020 03:15   CT ABDOMEN PELVIS W CONTRAST  Result Date: 06/15/2020 CLINICAL DATA:  Abdominal pain and fever postoperative. Poisoning by fish tank [place] 3 hours ago with dizziness. Recent cholecystectomy. Hysterectomy 5 weeks ago. EXAM: CT ABDOMEN AND PELVIS WITH CONTRAST TECHNIQUE: Multidetector CT imaging of the abdomen and pelvis was performed using the standard protocol following bolus administration of intravenous contrast. CONTRAST:  100mL OMNIPAQUE IOHEXOL 300 MG/ML  SOLN COMPARISON:  05/20/2020 and 05/15/2020 FINDINGS: Lower chest: Lung bases are clear. Hepatobiliary: Diffuse fatty infiltration of the liver. Surgical absence of the gallbladder. Small collection in the gallbladder fossa measuring 1.5 x 2.6 cm in diameter. This could represent postoperative collection or abscess. No bile duct dilatation. Pancreas: Unremarkable. No pancreatic ductal dilatation or surrounding inflammatory changes. Spleen: Normal in size without focal abnormality. Adrenals/Urinary Tract: No adrenal gland nodules. Kidneys are symmetrical with homogeneous nephrograms. No hydronephrosis or hydroureter. 3 mm stone in the lower pole left kidney. Bladder is unremarkable. Stomach/Bowel: Stomach, small bowel, and colon are not abnormally distended. No wall thickening or inflammatory changes are appreciated. Appendix is not identified. Vascular/Lymphatic: No significant vascular findings are present. No enlarged abdominal or pelvic  lymph nodes. Reproductive: Status post hysterectomy. No adnexal masses. Other: No free air or free fluid in the abdomen. Abdominal wall musculature appears intact. Focal infiltration in the subcutaneous fat just to the right of the midline at the mid abdomen. This likely represents postoperative change from port placement. In the right anterior pelvis, there is again identification of a rounded hyperdense structure measuring 4.8 x 5.8 cm in diameter. This indents the anterior wall of the bladder. Likely represents a hematoma. Decreasing in size since previous study. Musculoskeletal: No acute or significant osseous findings. IMPRESSION: 1. Surgical absence of the gallbladder. Small collection in the gallbladder fossa could represent postoperative collection or abscess. 2. Diffuse fatty infiltration of the liver. 3. 3 mm nonobstructing stone in the lower pole left kidney. No hydronephrosis or hydroureter. 4. Status post hysterectomy. 5. Focal infiltration in the subcutaneous fat just to the right of the midline at the mid abdomen likely represents postoperative change from port placement. 6. Right anterior pelvic hematoma is decreasing in size since prior study. Electronically Signed   By: Burman NievesWilliam  Stevens M.D.   On: 06/15/2020 03:07    Radiology CT ABDOMEN PELVIS W CONTRAST  Result Date: 07/06/2020 CLINICAL DATA:  Acute abdominal pain. Gallbladder removal 4 weeks ago. Infection of the incision. EXAM: CT ABDOMEN AND PELVIS WITH CONTRAST TECHNIQUE: Multidetector CT imaging of the abdomen and pelvis was performed using the standard protocol following bolus administration of intravenous contrast. CONTRAST:  100mL OMNIPAQUE IOHEXOL 300 MG/ML  SOLN COMPARISON:  07/01/2020 FINDINGS: Lower chest: Lung bases are clear. Hepatobiliary: Diffuse fatty infiltration of the liver. No focal liver lesions. Surgical absence of the gallbladder. No bile duct dilatation. Pancreas: Unremarkable. No pancreatic ductal dilatation or  surrounding inflammatory changes. Spleen: Normal in size without focal abnormality. Adrenals/Urinary Tract: No adrenal gland nodules. 4 mm stone  in the midportion of the right kidney. No hydronephrosis or hydroureter. Bladder is normal. Stomach/Bowel: Stomach, small bowel, and colon are not abnormally distended. Scattered diverticula in the colon. No inflammatory changes. Appendix is normal. Vascular/Lymphatic: No significant vascular findings are present. No enlarged abdominal or pelvic lymph nodes. Reproductive: Status post hysterectomy. No adnexal masses. Other: Areas of infiltration in the subcutaneous fat of the anterior abdominal wall likely representing postoperative changes. Possibly cellulitis. No abscesses. Similar appearance to prior study. Right anterior pelvic circumscribed collection measuring 5.4 cm in diameter. Surrounding infiltration or scarring. Changes likely represent postoperative collection or hematoma. No significant interval change. Musculoskeletal: No acute or significant osseous findings. IMPRESSION: 1. Diffuse fatty infiltration of the liver. 2. 4 mm nonobstructing stone in the right kidney. 3. Areas of infiltration in the subcutaneous fat of the anterior abdominal wall likely representing postoperative changes. Possibly cellulitis. No abscesses. No change since prior study. 4. Right anterior pelvic circumscribed collection measuring 5.4 cm in diameter with surrounding infiltration or scarring. Changes likely represent postoperative collection or hematoma. No significant interval change. Electronically Signed   By: Burman Nieves M.D.   On: 07/06/2020 03:15    Procedures Procedures (including critical care time)  Medications Ordered in ED Medications  dicyclomine (BENTYL) capsule 10 mg (has no administration in time range)  iohexol (OMNIPAQUE) 300 MG/ML solution 100 mL (100 mLs Intravenous Contrast Given 07/06/20 0302)  ketorolac (TORADOL) 30 MG/ML injection 30 mg (30 mg  Intravenous Given 07/06/20 0324)  haloperidol lactate (HALDOL) injection 2 mg (2 mg Intravenous Given 07/06/20 0325)  doxycycline (VIBRA-TABS) tablet 100 mg (100 mg Oral Given 07/06/20 0325)    ED Course  I have reviewed the triage vital signs and the nursing notes.  Pertinent labs & imaging results that were available during my care of the patient were reviewed by me and considered in my medical decision making (see chart for details).    I believe there is a component of drug seeking behavior.  I will not be giving narcotics in the ED nor will I be writing an RX.  I will write for topical antibiotics and doxycycline.  Patient reports she has an appointment this week with her surgeon, she can discuss her ongoing pain with the surgeon.  No signs of sepsis.  CT is essentially unchanged for 07/01/20.    Finlee Concepcion was evaluated in Emergency Department on 07/06/2020 for the symptoms described in the history of present illness. She was evaluated in the context of the global COVID-19 pandemic, which necessitated consideration that the patient might be at risk for infection with the SARS-CoV-2 virus that causes COVID-19. Institutional protocols and algorithms that pertain to the evaluation of patients at risk for COVID-19 are in a state of rapid change based on information released by regulatory bodies including the CDC and federal and state organizations. These policies and algorithms were followed during the patient's care in the ED.  Final Clinical Impression(s) / ED Diagnoses Final diagnoses:  Cellulitis, unspecified cellulitis site  Post-op pain   Return for intractable cough, coughing up blood,fevers >100.4 unrelieved by medication, shortness of breath, intractable vomiting, chest pain, shortness of breath, weakness,numbness, changes in speech, facial asymmetry,abdominal pain, passing out,Inability to tolerate liquids or food, cough, altered mental status or any concerns. No signs  of systemic illness or infection. The patient is nontoxic-appearing on exam and vital signs are within normal limits.   I have reviewed the triage vital signs and the nursing notes. Pertinent labs &imaging results  that were available during my care of the patient were reviewed by me and considered in my medical decision making (see chart for details).After history, exam, and medical workup I feel the patient has beenappropriately medically screened and is safe for discharge home. Pertinent diagnoses were discussed with the patient. Patient was given return precautions.  Rx / DC Orders ED Discharge Orders         Ordered    doxycycline (VIBRAMYCIN) 100 MG capsule  2 times daily        07/06/20 0322    mupirocin cream (BACTROBAN) 2 %  2 times daily        07/06/20 0322           Retha Bither, MD 07/06/20 [phone]

## 2020-07-11 ENCOUNTER — Other Ambulatory Visit: Payer: Self-pay

## 2020-07-11 ENCOUNTER — Emergency Department (HOSPITAL_BASED_OUTPATIENT_CLINIC_OR_DEPARTMENT_OTHER)
Admission: EM | Admit: 2020-07-11 | Discharge: 2020-07-11 | Disposition: A | Discharge status: Home / Self Care | Payer: Medicaid Other | Attending: Emergency Medicine | Admitting: Emergency Medicine

## 2020-07-11 ENCOUNTER — Encounter (HOSPITAL_BASED_OUTPATIENT_CLINIC_OR_DEPARTMENT_OTHER): Payer: Self-pay | Admitting: Emergency Medicine

## 2020-07-11 DIAGNOSIS — F17210 Nicotine dependence, cigarettes, uncomplicated: Secondary | ICD-10-CM | POA: Insufficient documentation

## 2020-07-11 DIAGNOSIS — L7682 Other postprocedural complications of skin and subcutaneous tissue: Secondary | ICD-10-CM | POA: Insufficient documentation

## 2020-07-11 DIAGNOSIS — R1013 Epigastric pain: Secondary | ICD-10-CM | POA: Insufficient documentation

## 2020-07-11 MED ORDER — OXYCODONE-ACETAMINOPHEN 5-325 MG PO TABS: 1.0000 | ORAL_TABLET | Freq: Four times a day (QID) | ORAL | 0 refills | Status: DC | PRN

## 2020-07-11 NOTE — ED Provider Notes (Signed)
[facility] EMERGENCY DEPARTMENT Provider Note   CSN: [id number] Arrival date & time: 07/11/20  2105     History Chief Complaint  Patient presents with  . incision pain    Katie Spencer is a 39 y.o. female.  She has had multiple recent surgeries including cholecystectomy and hysterectomy.  She is being treated by her doctors for a postoperative hematoma and a trocar incision infection.  Taking doxycycline.  She is out of her pain meds.  She said her kids splashed pool water onto her abdomen yesterday and since then she has had burning severe pain.  No fever.  She was seen 5 days ago for abdominal pain and had labs and repeat CT imaging with no significant findings compared with prior.  The history is provided by the patient.  Abdominal Pain Pain location:  Epigastric Pain quality: burning   Pain radiates to:  Does not radiate Pain severity:  Severe Onset quality:  Gradual Duration:  2 days Timing:  Constant Progression:  Unchanged Chronicity:  New Context: recent travel   Relieved by:  Nothing Worsened by:  Palpation Ineffective treatments:  None tried Associated symptoms: no chest pain, no dysuria, no fever, no shortness of breath and no sore throat        Past Medical History:  Diagnosis Date  . Abnormal vaginal bleeding   . Chronic abdominal pain     There are no problems to display for this patient.   Past Surgical History:  Procedure Laterality Date  . ABDOMINAL HYSTERECTOMY    . CESAREAN SECTION  2006, 2009, 2013, 2014  . CHOLECYSTECTOMY       OB History   No obstetric history on file.     History reviewed. No pertinent family history.  Social History   Tobacco Use  . Smoking status: Current Every Day Smoker    Packs/day: 1.00    Types: Cigarettes  . Smokeless tobacco: Never Used  Vaping Use  . Vaping Use: Never used  Substance Use Topics  . Alcohol use: Not Currently  . Drug use: Never    Home Medications Prior to  Admission medications   Medication Sig Start Date End Date Taking? Authorizing Provider  acetaminophen (TYLENOL) 500 MG tablet Take 1,000 mg by mouth every 6 (six) hours as needed for moderate pain or headache.    [provider]  belladonna-opium (B&O SUPPRETTES) 16.2-30 MG suppository Place 1 suppository rectally every 8 (eight) hours as needed for pain. Patient not taking: Reported on 05/27/2020 05/20/20   Gilda Crease, MD  cephALEXin (KEFLEX) 500 MG capsule Take 2 capsules (1,000 mg total) by mouth 2 (two) times daily. Patient not taking: Reported on 05/27/2020 04/12/20   Cathren Laine, MD  citalopram (CELEXA) 40 MG tablet Take 40 mg by mouth daily. 01/01/20   [provider]  docusate sodium (COLACE) 100 MG capsule Take 100 mg by mouth 2 (two) times daily.  04/30/20   [provider]  doxycycline (VIBRAMYCIN) 100 MG capsule Take 1 capsule (100 mg total) by mouth 2 (two) times daily. One po bid x 7 days 07/06/20   Palumbo, April, MD  hydrOXYzine (ATARAX/VISTARIL) 25 MG tablet Take 25 mg by mouth 3 (three) times daily as needed for anxiety.  04/30/20   [provider]  ibuprofen (ADVIL) 800 MG tablet Take 1 tablet (800 mg total) by mouth every 8 (eight) hours as needed (for pain). 04/16/20   Molpus, John, MD  lansoprazole (PREVACID) 30 MG capsule Take  1 capsule daily while taking ibuprofen. Patient not taking: Reported on 05/27/2020 04/16/20   Molpus, John, MD  mupirocin cream (BACTROBAN) 2 % Apply 1 application topically 2 (two) times daily. 07/06/20   Palumbo, April, MD  oxyCODONE-acetaminophen (PERCOCET/ROXICET) 5-325 MG tablet Take 1-2 tablets by mouth every 6 (six) hours as needed for severe pain. Patient not taking: Reported on 05/27/2020 04/12/20   Cathren Laine, MD  PROAIR HFA [phone] Base) MCG/ACT inhaler Inhale 2 puffs into the lungs every 4 (four) hours as needed for wheezing or shortness of breath.  04/30/20   [provider]  VRAYLAR 6 MG CAPS  Take 6 mg by mouth at bedtime.  01/02/20   [provider]    Allergies    Patient has no known allergies.  Review of Systems   Review of Systems  Constitutional: Negative for fever.  HENT: Negative for sore throat.   Eyes: Negative for visual disturbance.  Respiratory: Negative for shortness of breath.   Cardiovascular: Negative for chest pain.  Gastrointestinal: Positive for abdominal pain.  Genitourinary: Negative for dysuria.  Musculoskeletal: Negative for neck pain.  Skin: Positive for wound. Negative for rash.  Neurological: Negative for headaches.    Physical Exam Updated Vital Signs BP 121/81   Pulse 72   Temp 98.8 F (37.1 C) (Oral)   Resp 20   Wt 103.9 kg   LMP 04/04/2020   SpO2 100%   BMI 39.31 kg/m   Physical Exam Vitals and nursing note reviewed.  Constitutional:      General: She is not in acute distress.    Appearance: She is well-developed.  HENT:     Head: Normocephalic and atraumatic.  Eyes:     Conjunctiva/sclera: Conjunctivae normal.  Cardiovascular:     Rate and Rhythm: Normal rate and regular rhythm.     Heart sounds: No murmur heard.   Pulmonary:     Effort: Pulmonary effort is normal. No respiratory distress.     Breath sounds: Normal breath sounds.  Abdominal:     Palpations: Abdomen is soft.     Tenderness: There is abdominal tenderness.     Comments: She has tenderness when I palpate her trocar area there is a healing incision and no surrounding erythema.  No palpable induration.  Musculoskeletal:     Cervical back: Neck supple.  Skin:    General: Skin is warm and dry.  Neurological:     Mental Status: She is alert.     ED Results / Procedures / Treatments   Labs (all labs ordered are listed, but only abnormal results are displayed) Labs Reviewed - No data to display  EKG None  Radiology No results found.  Procedures Procedures (including critical care time)  Medications Ordered in ED Medications - No data  to display  ED Course  I have reviewed the triage vital signs and the nursing notes.  Pertinent labs & imaging results that were available during my care of the patient were reviewed by me and considered in my medical decision making (see chart for details).    MDM Rules/Calculators/A&P                         39 year old female here with complaint of worsening abdominal pain and possible wound infection.  She is on antibiotics already from prior visit ED 4 days ago.  She had an extensive work-up that including blood work and imaging.  During that visit ED physician  had concerns for drug-seeking behavior and did not give her a prescription for pain medicine.  I find the patient's exam to be very inconsistent.  She was quite dramatic with the amount of pain she was experiencing when palpating near her incision that looked to be healing well.  I will give her a small prescription for some pain meds but counseled that she would need to follow-up with her primary care doctor and her treating surgeons for any other pain medication.  Do not feel she needs any lab work or imaging at this point.  Final Clinical Impression(s) / ED Diagnoses Final diagnoses:  Incisional pain    Rx / DC Orders ED Discharge Orders         Ordered    oxyCODONE-acetaminophen (PERCOCET/ROXICET) 5-325 MG tablet  Every 6 hours PRN        07/11/20 2242           Terrilee Files, MD 07/12/20 1027

## 2020-07-11 NOTE — Discharge Instructions (Addendum)
You were seen in the emergency department for evaluation of increased abdominal pain after having pulled water splashed onto your abdominal incision.  Your exam did not show any obvious signs of infection.  We are providing you with a prescription for some pain medicine.  Please contact your primary care doctor or your surgeon for any further pain medicine.

## 2020-07-11 NOTE — ED Triage Notes (Addendum)
Pt reports actively being treated for surgical site infection. States last night her kids splashed her with pool water and pain. Denies fevers. Has not finished antibiotics yet from previous ED visit. Also c/o chronic left shoulder pain.

## 2020-08-18 ENCOUNTER — Emergency Department (HOSPITAL_BASED_OUTPATIENT_CLINIC_OR_DEPARTMENT_OTHER)
Admission: EM | Admit: 2020-08-18 | Discharge: 2020-08-18 | Disposition: A | Discharge status: Home / Self Care | Payer: Medicaid Other | Attending: Emergency Medicine | Admitting: Emergency Medicine

## 2020-08-18 ENCOUNTER — Other Ambulatory Visit: Payer: Self-pay

## 2020-08-18 ENCOUNTER — Encounter (HOSPITAL_BASED_OUTPATIENT_CLINIC_OR_DEPARTMENT_OTHER): Payer: Self-pay | Admitting: Emergency Medicine

## 2020-08-18 DIAGNOSIS — F17210 Nicotine dependence, cigarettes, uncomplicated: Secondary | ICD-10-CM | POA: Diagnosis not present

## 2020-08-18 DIAGNOSIS — M62830 Muscle spasm of back: Secondary | ICD-10-CM | POA: Insufficient documentation

## 2020-08-18 DIAGNOSIS — M549 Dorsalgia, unspecified: Secondary | ICD-10-CM | POA: Diagnosis present

## 2020-08-18 MED ORDER — DIAZEPAM 5 MG PO TABS: 5.0000 mg | ORAL_TABLET | Freq: Three times a day (TID) | ORAL | 0 refills | Status: DC | PRN

## 2020-08-18 MED ORDER — DIAZEPAM 5 MG PO TABS
10.0000 mg | ORAL_TABLET | Freq: Once | ORAL | Status: AC
  Administered 2020-08-18: 10 mg via ORAL
  Filled 2020-08-18: qty 2

## 2020-08-18 NOTE — ED Triage Notes (Addendum)
Pt c/o onset of lower back pain after lifting 50lb bag of dog food at 2000. Pt had flexeril and tylenol at home without relief.

## 2020-08-18 NOTE — ED Provider Notes (Signed)
[facility] Provider Note: [name], MD, FACEP  CSN: [id number] MRN: [id number] ARRIVAL: 08/18/20 at 0034 ROOM: [facility]02/[facility]   CHIEF COMPLAINT  Back Pain   HISTORY OF PRESENT ILLNESS  08/18/20 4:32 AM Katie Spencer is a 39 y.o. female who developed back pain after lifting a 50 pound bag of dog food yesterday evening about 8 PM.  She has taken Flexeril and Tylenol without relief.  She rates the pain is a 7 out of 10, worse with movement.  She describes the pain as feeling like a spasm.  Flexeril usually helps her back when it spasms but not this time.  She denies any numbness, weakness or change in bowel or bladder function.   Past Medical History:  Diagnosis Date  . Abnormal vaginal bleeding   . Chronic abdominal pain     Past Surgical History:  Procedure Laterality Date  . ABDOMINAL HYSTERECTOMY    . CESAREAN SECTION  2006, 2009, 2013, 2014  . CHOLECYSTECTOMY      No family history on file.  Social History   Tobacco Use  . Smoking status: Current Every Day Smoker    Packs/day: 1.00    Types: Cigarettes  . Smokeless tobacco: Never Used  Vaping Use  . Vaping Use: Never used  Substance Use Topics  . Alcohol use: Not Currently  . Drug use: Never    Prior to Admission medications   Medication Sig Start Date End Date Taking? Authorizing Provider  acetaminophen (TYLENOL) 500 MG tablet Take 1,000 mg by mouth every 6 (six) hours as needed for moderate pain or headache.    [provider]  citalopram (CELEXA) 40 MG tablet Take 40 mg by mouth daily. 01/01/20   [provider]  diazepam (VALIUM) 5 MG tablet Take 1 tablet (5 mg total) by mouth every 8 (eight) hours as needed for muscle spasms. 08/18/20   Atalie Oros, MD  docusate sodium (COLACE) 100 MG capsule Take 100 mg by mouth 2 (two) times daily.  04/30/20   [provider]  hydrOXYzine (ATARAX/VISTARIL) 25 MG tablet Take 25 mg by mouth 3 (three) times daily as needed for  anxiety.  04/30/20   [provider]  ibuprofen (ADVIL) 800 MG tablet Take 1 tablet (800 mg total) by mouth every 8 (eight) hours as needed (for pain). 04/16/20   Mysha Peeler, MD  PROAIR HFA [phone] Base) MCG/ACT inhaler Inhale 2 puffs into the lungs every 4 (four) hours as needed for wheezing or shortness of breath.  04/30/20   [provider]  VRAYLAR 6 MG CAPS Take 6 mg by mouth at bedtime.  01/02/20   [provider]  belladonna-opium (B&O SUPPRETTES) 16.2-30 MG suppository Place 1 suppository rectally every 8 (eight) hours as needed for pain. Patient not taking: Reported on 05/27/2020 05/20/20 08/18/20  Gilda Crease, MD  lansoprazole (PREVACID) 30 MG capsule Take 1 capsule daily while taking ibuprofen. Patient not taking: Reported on 05/27/2020 04/16/20 08/18/20  Antigone Crowell, Jonny Ruiz, MD    Allergies Patient has no known allergies.   REVIEW OF SYSTEMS  Negative except as noted here or in the History of Present Illness.   PHYSICAL EXAMINATION  Initial Vital Signs Blood pressure 129/80, pulse 81, temperature 98.3 F (36.8 C), temperature source Oral, resp. rate 17, height 5\' 4"  (1.626 m), weight 102.1 kg, last menstrual period 04/04/2020, SpO2 98 %.  Examination General: Well-developed, well-nourished female in no acute distress; appearance consistent with age of record HENT: normocephalic;  atraumatic Eyes: pupils equal, round and reactive to light; extraocular muscles intact Neck: supple Heart: regular rate and rhythm Lungs: clear to auscultation bilaterally Abdomen: soft; nondistended; nontender; bowel sounds present Back: Tenderness and palpable spasm of bilateral para lumbar muscles; pain on movement of lower back Extremities: No deformity; full range of motion; pulses normal Neurologic: Awake, alert and oriented; motor function intact in all extremities and symmetric; sensation intact and symmetric in lower extremities; no facial droop Skin: Warm and  dry Psychiatric: Normal mood and affect   RESULTS  Summary of this visit's results, reviewed and interpreted by myself:   EKG Interpretation  Date/Time:    Ventricular Rate:    PR Interval:    QRS Duration:   QT Interval:    QTC Calculation:   R Axis:     Text Interpretation:        Laboratory Studies: No results found for this or any previous visit (from the past 24 hour(s)). Imaging Studies: No results found.  ED COURSE and MDM  Nursing notes, initial and subsequent vitals signs, including pulse oximetry, reviewed and interpreted by myself.  Vitals:   08/18/20 0041 08/18/20 0044 08/18/20 0345 08/18/20 0400  BP:  134/77 136/80 129/80  Pulse:  91 86 81  Resp:  20 18 17   Temp:  98.3 F (36.8 C)    TempSrc:  Oral    SpO2:  100% 98% 98%  Weight: 102.1 kg     Height: 5\' 4"  (1.626 m)      Medications  diazepam (VALIUM) tablet 10 mg (has no administration in time range)    We will try Valium as she has not had success with Flexeril.  She is not currently receiving any narcotics.  PROCEDURES  Procedures   ED DIAGNOSES     ICD-10-CM   1. Spasm of back muscles  M62.830        Decklyn Hornik, , MD 08/18/20 [phone]

## 2020-08-18 NOTE — ED Notes (Signed)
Pt contacting husband for ride, was going to take Benedetto Goad; this RN informed pt about the sedative effects of Valium and not wanting to put pt in car with stranger; pt verbalized understanding

## 2020-09-20 ENCOUNTER — Emergency Department (HOSPITAL_BASED_OUTPATIENT_CLINIC_OR_DEPARTMENT_OTHER): Payer: Medicaid Other

## 2020-09-20 ENCOUNTER — Other Ambulatory Visit: Payer: Self-pay

## 2020-09-20 ENCOUNTER — Emergency Department (HOSPITAL_BASED_OUTPATIENT_CLINIC_OR_DEPARTMENT_OTHER)
Admission: EM | Admit: 2020-09-20 | Discharge: 2020-09-20 | Disposition: A | Discharge status: Home / Self Care | Payer: Medicaid Other | Attending: Emergency Medicine | Admitting: Emergency Medicine

## 2020-09-20 ENCOUNTER — Encounter (HOSPITAL_BASED_OUTPATIENT_CLINIC_OR_DEPARTMENT_OTHER): Payer: Self-pay

## 2020-09-20 DIAGNOSIS — F17210 Nicotine dependence, cigarettes, uncomplicated: Secondary | ICD-10-CM | POA: Insufficient documentation

## 2020-09-20 DIAGNOSIS — S6992XA Unspecified injury of left wrist, hand and finger(s), initial encounter: Secondary | ICD-10-CM | POA: Diagnosis present

## 2020-09-20 DIAGNOSIS — Z23 Encounter for immunization: Secondary | ICD-10-CM | POA: Diagnosis not present

## 2020-09-20 DIAGNOSIS — W19XXXA Unspecified fall, initial encounter: Secondary | ICD-10-CM

## 2020-09-20 DIAGNOSIS — W000XXA Fall on same level due to ice and snow, initial encounter: Secondary | ICD-10-CM | POA: Diagnosis not present

## 2020-09-20 DIAGNOSIS — S60512A Abrasion of left hand, initial encounter: Secondary | ICD-10-CM | POA: Insufficient documentation

## 2020-09-20 DIAGNOSIS — M25532 Pain in left wrist: Secondary | ICD-10-CM | POA: Insufficient documentation

## 2020-09-20 MED ORDER — OXYCODONE-ACETAMINOPHEN 5-325 MG PO TABS
1.0000 | ORAL_TABLET | ORAL | Status: DC | PRN
  Administered 2020-09-20: 1 via ORAL
  Filled 2020-09-20: qty 1

## 2020-09-20 MED ORDER — TETANUS-DIPHTH-ACELL PERTUSSIS 5-2.5-18.5 LF-MCG/0.5 IM SUSY
PREFILLED_SYRINGE | INTRAMUSCULAR | Status: AC
  Filled 2020-09-20: qty 0.5

## 2020-09-20 MED ORDER — KETOROLAC TROMETHAMINE 15 MG/ML IJ SOLN
15.0000 mg | Freq: Once | INTRAMUSCULAR | Status: AC
  Administered 2020-09-20: 15 mg via INTRAMUSCULAR
  Filled 2020-09-20: qty 1

## 2020-09-20 MED ORDER — CYCLOBENZAPRINE HCL 10 MG PO TABS: 10.0000 mg | ORAL_TABLET | Freq: Every day | ORAL | 0 refills | Status: DC

## 2020-09-20 MED ORDER — TETANUS-DIPHTH-ACELL PERTUSSIS 5-2.5-18.5 LF-MCG/0.5 IM SUSY
0.5000 mL | PREFILLED_SYRINGE | Freq: Once | INTRAMUSCULAR | Status: AC
  Administered 2020-09-20: 0.5 mL via INTRAMUSCULAR

## 2020-09-20 MED ORDER — IBUPROFEN 800 MG PO TABS: 800.0000 mg | ORAL_TABLET | Freq: Three times a day (TID) | ORAL | 0 refills | Status: DC | PRN

## 2020-09-20 NOTE — ED Provider Notes (Signed)
[facility] EMERGENCY DEPARTMENT Provider Note   CSN: [id number] Arrival date & time: 09/20/20  1849     History Chief Complaint  Patient presents with  . Wrist Injury    Katie Spencer is a 40 y.o. female.  HPI Patient is a 40 year old female who presents the emergency department due to left wrist pain.  Patient states she slipped on ice earlier today and fell on an outstretched left hand.  She had sudden onset pain to the left wrist diffusely.  Worsens with movement.  Was given a Percocet in triage and had her Tdap updated.  She has 2 abrasions to the palm of the left hand with no active bleeding.  Reports some tingling in digits 4 and 5 of the left hand.  No numbness.    Past Medical History:  Diagnosis Date  . Abnormal vaginal bleeding   . Chronic abdominal pain     There are no problems to display for this patient.   Past Surgical History:  Procedure Laterality Date  . ABDOMINAL HYSTERECTOMY    . CESAREAN SECTION  2006, 2009, 2013, 2014  . CHOLECYSTECTOMY       OB History   No obstetric history on file.     History reviewed. No pertinent family history.  Social History   Tobacco Use  . Smoking status: Current Every Day Smoker    Packs/day: 1.00    Types: Cigarettes  . Smokeless tobacco: Never Used  Vaping Use  . Vaping Use: Never used  Substance Use Topics  . Alcohol use: Not Currently  . Drug use: Never    Home Medications Prior to Admission medications   Medication Sig Start Date End Date Taking? Authorizing Provider  acetaminophen (TYLENOL) 500 MG tablet Take 1,000 mg by mouth every 6 (six) hours as needed for moderate pain or headache.    [provider]  citalopram (CELEXA) 40 MG tablet Take 40 mg by mouth daily. 01/01/20   [provider]  diazepam (VALIUM) 5 MG tablet Take 1 tablet (5 mg total) by mouth every 8 (eight) hours as needed for muscle spasms. 08/18/20   Molpus, John, MD  docusate sodium (COLACE) 100 MG  capsule Take 100 mg by mouth 2 (two) times daily.  04/30/20   [provider]  hydrOXYzine (ATARAX/VISTARIL) 25 MG tablet Take 25 mg by mouth 3 (three) times daily as needed for anxiety.  04/30/20   [provider]  ibuprofen (ADVIL) 400 MG tablet Take by mouth. 08/31/20   [provider]  ibuprofen (ADVIL) 800 MG tablet Take 1 tablet (800 mg total) by mouth every 8 (eight) hours as needed (for pain). 04/16/20   Molpus, John, MD  PROAIR HFA 108 [phone] Base) MCG/ACT inhaler Inhale 2 puffs into the lungs every 4 (four) hours as needed for wheezing or shortness of breath.  04/30/20   [provider]  VRAYLAR 6 MG CAPS Take 6 mg by mouth at bedtime.  01/02/20   [provider]  belladonna-opium (B&O SUPPRETTES) 16.2-30 MG suppository Place 1 suppository rectally every 8 (eight) hours as needed for pain. Patient not taking: Reported on 05/27/2020 05/20/20 08/18/20  Orpah Greek, MD  lansoprazole (PREVACID) 30 MG capsule Take 1 capsule daily while taking ibuprofen. Patient not taking: Reported on 05/27/2020 04/16/20 08/18/20  Molpus, Jenny Reichmann, MD    Allergies    Patient has no known allergies.  Review of Systems   Review of Systems  Musculoskeletal: Positive for arthralgias and myalgias.  Skin: Positive for wound. Negative for color change.  Neurological: Negative for numbness.   Physical Exam Updated Vital Signs BP 133/77 (BP Location: Left Arm)   Pulse 72   Temp 98.6 F (37 C)   Resp 16   Ht 5\' 3"  (1.6 m)   Wt 104.3 kg   LMP 04/04/2020   SpO2 98%   BMI 40.74 kg/m   Physical Exam Vitals and nursing note reviewed.  Constitutional:      General: She is not in acute distress.    Appearance: She is well-developed.  HENT:     Head: Normocephalic and atraumatic.     Right Ear: External ear normal.     Left Ear: External ear normal.  Eyes:     General: No scleral icterus.       Right eye: No discharge.        Left eye: No discharge.      Conjunctiva/sclera: Conjunctivae normal.  Neck:     Trachea: No tracheal deviation.  Cardiovascular:     Rate and Rhythm: Normal rate.  Pulmonary:     Effort: Pulmonary effort is normal. No respiratory distress.     Breath sounds: No stridor.  Abdominal:     General: There is no distension.  Musculoskeletal:        General: Tenderness present. No swelling or deformity.     Cervical back: Neck supple.     Comments: Moderate TTP noted circumferentially around the left wrist.  No significant edema or ecchymosis.  No snuffbox tenderness.  Full range of motion of the left wrist.  Full range of motion of all 5 fingers of the left hand.  Good cap refill.  2+ radial pulses.  Distal sensation intact.  Skin:    General: Skin is warm and dry.     Findings: Abrasion present. No rash.     Comments: 2 well approximated linear abrasions noted to the palm of the left hand.  No bleeding.  Mild tenderness overlying the wounds.  Neurological:     Mental Status: She is alert.     Cranial Nerves: Cranial nerve deficit: no gross deficits.    ED Results / Procedures / Treatments   Labs (all labs ordered are listed, but only abnormal results are displayed) Labs Reviewed - No data to display  EKG None  Radiology DG Wrist Complete Left  Result Date: 09/20/2020 CLINICAL DATA:  Fall.  Dorsal left wrist pain. EXAM: LEFT WRIST - COMPLETE 3+ VIEW COMPARISON:  None. FINDINGS: There is no evidence of fracture or dislocation. There is no evidence of arthropathy or other focal bone abnormality. Soft tissues are unremarkable. IMPRESSION: Negative. Electronically Signed   By: 10-14-1971 M.D.   On: 09/20/2020 19:50   Procedures Procedures   Medications Ordered in ED Medications  oxyCODONE-acetaminophen (PERCOCET/ROXICET) 5-325 MG per tablet 1 tablet (1 tablet Oral Given 09/20/20 1929)  Tdap (BOOSTRIX) 5-2.5-18.5 LF-MCG/0.5 injection (has no administration in time range)  Tdap (BOOSTRIX) injection 0.5 mL (0.5  mLs Intramuscular Given 09/20/20 2218)  ketorolac (TORADOL) 15 MG/ML injection 15 mg (15 mg Intramuscular Given 09/20/20 2253)    ED Course  I have reviewed the triage vital signs and the nursing notes.  Pertinent labs & imaging results that were available during my care of the patient were reviewed by me and considered in my medical decision making (see chart for details).    MDM Rules/Calculators/A&P  Patient is a 40 year old female who presents the emergency department due to left wrist pain after a fall that occurred this morning.  Patient is neurovascularly intact in the left hand.  Has full range of motion of the left wrist.  Circumferential tenderness along the left wrist.  No snuffbox tenderness.  Patient does have 2 abrasions to the left palm.  They are well approximated and superficial.  Do not recommend closure of the wounds.  Patient is amenable.  Discussed wound care in length.  We will place patient in a left wrist splint.  Range of motion exercises as tolerated.  Will prescribe a course of naproxen as well as Flexeril.  We discussed safety regarding this medication.  We will give follow-up with sports medicine if patient finds her symptoms are refractory.  We discussed return precautions.  Her questions were answered and she was amicable at the time of discharge.  Her vital signs are stable.  Final Clinical Impression(s) / ED Diagnoses Final diagnoses:  Left wrist pain  Fall, initial encounter   Rx / DC Orders ED Discharge Orders         Ordered    cyclobenzaprine (FLEXERIL) 10 MG tablet  Daily at bedtime        09/20/20 2258    ibuprofen (ADVIL) 800 MG tablet  Every 8 hours PRN        09/20/20 2258           Placido Sou, PA-C 09/20/20 2303    Melene Plan, DO 09/20/20 2304

## 2020-09-20 NOTE — Discharge Instructions (Addendum)
I prescribed you 2 medications.  First medication is called ibuprofen.  You can take this 3 times a day for management of your pain and inflammation in the left wrist.  Try to take this with a small amount of food, as it can cause an upset stomach.  I am prescribing you a strong muscle relaxer called flexeril. Please only take this medication once in the evening with dinner. This medication can make you quite drowsy. Do not mix it with alcohol. Do not drive a vehicle after taking it.   I given you follow-up information below for Dr. Jordan Likes.  He is a sports medicine specialist.  He is located in the same building as this emergency department.  If you find your symptoms are not improved in 1 to 2 weeks, feel free to give him a call to schedule a follow-up appointment.  Feel free to return to the emergency department with new or worsening symptoms.  It was a pleasure to meet you.

## 2020-09-20 NOTE — ED Triage Notes (Signed)
Pt slipped on ice and injured left wrist. Reports that she has pain there and a few cuts. Bleeding controlled at time of triage.

## 2020-09-22 ENCOUNTER — Ambulatory Visit: Payer: Self-pay

## 2020-09-22 ENCOUNTER — Ambulatory Visit (INDEPENDENT_AMBULATORY_CARE_PROVIDER_SITE_OTHER): Payer: Medicaid Other | Admitting: Family Medicine

## 2020-09-22 ENCOUNTER — Other Ambulatory Visit: Payer: Self-pay

## 2020-09-22 VITALS — BP 126/70 | Ht 63.0 in | Wt 230.0 lb

## 2020-09-22 DIAGNOSIS — S63502A Unspecified sprain of left wrist, initial encounter: Secondary | ICD-10-CM | POA: Diagnosis not present

## 2020-09-22 DIAGNOSIS — S63502D Unspecified sprain of left wrist, subsequent encounter: Secondary | ICD-10-CM | POA: Insufficient documentation

## 2020-09-22 DIAGNOSIS — M25532 Pain in left wrist: Secondary | ICD-10-CM

## 2020-09-22 NOTE — Patient Instructions (Signed)
Nice to meet you Please continue the brace  Please use ice as needed  Please continue ibuprofen and tylenol  Please try the range of motion movements   Please send me a message in MyChart with any questions or updates.  Please see me back in 2 weeks.   --Dr. Jordan Likes

## 2020-09-22 NOTE — Progress Notes (Signed)
  Katie Spencer - 40 y.o. female MRN [id number]  Date of birth: 11-13-1980  SUBJECTIVE:  Including CC & ROS.  No chief complaint on file.   Katie Spencer is a 40 y.o. female that is presenting with left wrist pain.  She had a FOOSH injury a couple of days ago.  Now she is presenting with ulnar-sided wrist pain as well as dorsal sided wrist pain.  Has been using a wrist brace with some improvement.  She feels limited in her range of motion.  No prior history of injury or surgery.  Seems to be staying the same.  Has been taken ibuprofen..  Independent review of the left wrist x-ray from 1/3 shows no acute abnormality.   Review of Systems See HPI   HISTORY: Past Medical, Surgical, Social, and Family History Reviewed & Updated per EMR.   Pertinent Historical Findings include:  Past Medical History:  Diagnosis Date  . Abnormal vaginal bleeding   . Chronic abdominal pain     Past Surgical History:  Procedure Laterality Date  . ABDOMINAL HYSTERECTOMY    . CESAREAN SECTION  2006, 2009, 2013, 2014  . CHOLECYSTECTOMY      No family history on file.  Social History   Socioeconomic History  . Marital status: Married    Spouse name: Not on file  . Number of children: Not on file  . Years of education: Not on file  . Highest education level: Not on file  Occupational History  . Not on file  Tobacco Use  . Smoking status: Current Every Day Smoker    Packs/day: 1.00    Types: Cigarettes  . Smokeless tobacco: Never Used  Vaping Use  . Vaping Use: Never used  Substance and Sexual Activity  . Alcohol use: Not Currently  . Drug use: Never  . Sexual activity: Not on file  Other Topics Concern  . Not on file  Social History Narrative  . Not on file   Social Determinants of Health   Financial Resource Strain: Not on file  Food Insecurity: Not on file  Transportation Needs: Not on file  Physical Activity: Not on file  Stress: Not on file  Social Connections: Not on file   Intimate Partner Violence: Not on file     PHYSICAL EXAM:  VS: BP 126/70   Ht 5\' 3"  (1.6 m)   Wt 230 lb (104.3 kg)   LMP 04/04/2020   BMI 40.74 kg/m  Physical Exam Gen: NAD, alert, cooperative with exam, well-appearing MSK:  Left wrist: No ecchymosis or swelling. Limited flexion and extension. Limited ulnar and radial deviation. Normal grip strength. Normal thumb extension and abduction. No malrotation or misalignment. Neurovascularly intact  Limited ultrasound: Left wrist:  No changes of the distal radius or ulna. There does appear to be an effusion within the radioscaphoid joint. No changes appreciated of the TFCC. No changes of the scaphoid. No changes of the Pisa form.   Summary: Mild effusion within the wrist but no bony abnormalities.  Ultrasound and interpretation by 04/06/2020, MD    ASSESSMENT & PLAN:   Wrist sprain, left, initial encounter Injury occurred on 1/3.  Likely a sprain.  No bony changes on exam. -Counseled on home exercise therapy and supportive care. -Continue brace. -Counseled on ibuprofen. -Could consider physical therapy or a splint    ,

## 2020-09-22 NOTE — Assessment & Plan Note (Signed)
Injury occurred on 1/3.  Likely a sprain.  No bony changes on exam. -Counseled on home exercise therapy and supportive care. -Continue brace. -Counseled on ibuprofen. -Could consider physical therapy or a splint

## 2020-09-30 ENCOUNTER — Encounter (HOSPITAL_BASED_OUTPATIENT_CLINIC_OR_DEPARTMENT_OTHER): Payer: Self-pay

## 2020-09-30 ENCOUNTER — Emergency Department (HOSPITAL_BASED_OUTPATIENT_CLINIC_OR_DEPARTMENT_OTHER)
Admission: EM | Admit: 2020-09-30 | Discharge: 2020-09-30 | Disposition: A | Discharge status: Home / Self Care | Payer: Medicaid Other | Attending: Emergency Medicine | Admitting: Emergency Medicine

## 2020-09-30 ENCOUNTER — Other Ambulatory Visit: Payer: Self-pay

## 2020-09-30 DIAGNOSIS — F17210 Nicotine dependence, cigarettes, uncomplicated: Secondary | ICD-10-CM | POA: Insufficient documentation

## 2020-09-30 DIAGNOSIS — M62830 Muscle spasm of back: Secondary | ICD-10-CM | POA: Insufficient documentation

## 2020-09-30 DIAGNOSIS — M549 Dorsalgia, unspecified: Secondary | ICD-10-CM | POA: Diagnosis present

## 2020-09-30 DIAGNOSIS — G8929 Other chronic pain: Secondary | ICD-10-CM | POA: Insufficient documentation

## 2020-09-30 MED ORDER — DIAZEPAM 5 MG PO TABS: 5.0000 mg | ORAL_TABLET | Freq: Three times a day (TID) | ORAL | 0 refills | Status: DC | PRN

## 2020-09-30 MED ORDER — DIAZEPAM 5 MG/ML IJ SOLN
5.0000 mg | Freq: Once | INTRAMUSCULAR | Status: AC
  Administered 2020-09-30: 5 mg via INTRAMUSCULAR
  Filled 2020-09-30: qty 2

## 2020-09-30 MED ORDER — ACETAMINOPHEN 325 MG PO TABS
650.0000 mg | ORAL_TABLET | Freq: Once | ORAL | Status: AC
  Administered 2020-09-30: 650 mg via ORAL

## 2020-09-30 NOTE — ED Triage Notes (Signed)
Pt BIB [facility] from Home with Lower Back Pain.  Pt states she has Chronic Lower Back Pain and pt was recently prescribed Vicodin & Flexeril for the Pain.  Pt last took Vicodin yesterday AM and Flexeril 2 hrs PTA but pt states Pain has not decreased.   VSS but patient is febrile. Pt states Fevers began yesterday.   A&Ox4, GCS 15.

## 2020-09-30 NOTE — ED Notes (Signed)
Patient verbalizes understanding of discharge instructions. Opportunity for questioning and answers were provided. Armband removed by staff, pt discharged from ED to [facility] while patient awaits for [facility] for transportation. Registration Staff made aware to alert Nursing Staff if patient requires assistance when transportation arrives.

## 2020-09-30 NOTE — ED Provider Notes (Signed)
[facility] Provider Note: [name], MD, FACEP  CSN: [id number] MRN: [id number] ARRIVAL: 09/30/20 at 0349 ROOM: MH10/MH10   CHIEF COMPLAINT  Back Pain   HISTORY OF PRESENT ILLNESS  09/30/20 4:06 AM Katie Spencer is a 40 y.o. female with chronic back pain.  She was recently prescribed Vicodin and Flexeril.  She last took Vicodin yesterday morning and Flexeril 2 hours prior to arrival but states the pain is not decreased.  She rates it as a 6-10 out of 10 located in the right lower back and aching/spasming in nature.  It is worse with movement.  She denies any neurologic, bowel or bladder changes.   Past Medical History:  Diagnosis Date  . Abnormal vaginal bleeding   . Chronic abdominal pain     Past Surgical History:  Procedure Laterality Date  . ABDOMINAL HYSTERECTOMY    . CESAREAN SECTION  2006, 2009, 2013, 2014  . CHOLECYSTECTOMY      No family history on file.  Social History   Tobacco Use  . Smoking status: Current Every Day Smoker    Packs/day: 1.00    Types: Cigarettes  . Smokeless tobacco: Never Used  Vaping Use  . Vaping Use: Never used  Substance Use Topics  . Alcohol use: Not Currently  . Drug use: Never    Prior to Admission medications   Medication Sig Start Date End Date Taking? Authorizing Provider  acetaminophen (TYLENOL) 500 MG tablet Take 1,000 mg by mouth every 6 (six) hours as needed for moderate pain or headache.    [provider]  citalopram (CELEXA) 40 MG tablet Take 40 mg by mouth daily. 01/01/20   [provider]  cyclobenzaprine (FLEXERIL) 10 MG tablet Take 1 tablet (10 mg total) by mouth at bedtime. 09/20/20   Placido Sou, PA-C  diazepam (VALIUM) 5 MG tablet Take 1 tablet (5 mg total) by mouth every 8 (eight) hours as needed for muscle spasms. 09/30/20   Demico Ploch, MD  docusate sodium (COLACE) 100 MG capsule Take 100 mg by mouth 2 (two) times daily.  04/30/20   [provider]   hydrOXYzine (ATARAX/VISTARIL) 25 MG tablet Take 25 mg by mouth 3 (three) times daily as needed for anxiety.  04/30/20   [provider]  ibuprofen (ADVIL) 800 MG tablet Take 1 tablet (800 mg total) by mouth every 8 (eight) hours as needed. 09/20/20   Placido Sou, PA-C  PROAIR HFA 108 (90 Base) MCG/ACT inhaler Inhale 2 puffs into the lungs every 4 (four) hours as needed for wheezing or shortness of breath.  04/30/20   [provider]  VRAYLAR 6 MG CAPS Take 6 mg by mouth at bedtime.  01/02/20   [provider]  belladonna-opium (B&O SUPPRETTES) 16.2-30 MG suppository Place 1 suppository rectally every 8 (eight) hours as needed for pain. Patient not taking: Reported on 05/27/2020 05/20/20 08/18/20  Gilda Crease, MD  lansoprazole (PREVACID) 30 MG capsule Take 1 capsule daily while taking ibuprofen. Patient not taking: Reported on 05/27/2020 04/16/20 08/18/20  Lexie Morini, Jonny Ruiz, MD    Allergies Patient has no known allergies.   REVIEW OF SYSTEMS  Negative except as noted here or in the History of Present Illness.   PHYSICAL EXAMINATION  Initial Vital Signs Blood pressure 138/82, pulse 96, temperature 100.3 F (37.9 C), temperature source Oral, resp. rate (!) 22, height 5\' 4"  (1.626 m), weight 105.2 kg, last menstrual period 04/04/2020, SpO2 95 %.  Examination General: Well-developed, well-nourished female  in no acute distress; appearance consistent with age of record HENT: normocephalic; atraumatic Eyes: Normal appearance Neck: supple Heart: regular rate and rhythm Lungs: clear to auscultation bilaterally Abdomen: soft; nondistended; nontender; bowel sounds present Back: Right paraspinal soft tissue tenderness Extremities: No deformity; full range of motion; pulses normal Neurologic: Awake, alert and oriented; motor function intact in all extremities and symmetric; no facial droop Skin: Warm and dry Psychiatric: Moaning   RESULTS  Summary of this visit's  results, reviewed and interpreted by myself:   EKG Interpretation  Date/Time:    Ventricular Rate:    PR Interval:    QRS Duration:   QT Interval:    QTC Calculation:   R Axis:     Text Interpretation:        Laboratory Studies: No results found for this or any previous visit (from the past 24 hour(s)). Imaging Studies: No results found.  ED COURSE and MDM  Nursing notes, initial and subsequent vitals signs, including pulse oximetry, reviewed and interpreted by myself.  Vitals:   09/30/20 0358 09/30/20 0359  BP: 138/82   Pulse: 96   Resp: (!) 22   Temp: 100.3 F (37.9 C)   TempSrc: Oral   SpO2: 95%   Weight:  105.2 kg  Height:  5\' 4"  (1.626 m)   Medications  diazepam (VALIUM) injection 5 mg (5 mg Intramuscular Given 09/30/20 0417)    5:40 AM Resting comfortably after Valium IM.  PROCEDURES  Procedures   ED DIAGNOSES     ICD-10-CM   1. Back spasm  M62.830        Laurey Salser, 10/02/20, MD 09/30/20 [phone]

## 2020-10-06 ENCOUNTER — Ambulatory Visit (INDEPENDENT_AMBULATORY_CARE_PROVIDER_SITE_OTHER): Payer: Medicaid Other | Admitting: Family Medicine

## 2020-10-06 ENCOUNTER — Other Ambulatory Visit: Payer: Self-pay

## 2020-10-06 VITALS — BP 120/78 | Ht 64.0 in | Wt 234.0 lb

## 2020-10-06 DIAGNOSIS — S63502D Unspecified sprain of left wrist, subsequent encounter: Secondary | ICD-10-CM

## 2020-10-06 DIAGNOSIS — M5416 Radiculopathy, lumbar region: Secondary | ICD-10-CM | POA: Insufficient documentation

## 2020-10-06 NOTE — Patient Instructions (Signed)
Good to see you Please try physical therapy after the epidural  Please use heat on the back  Please try [facility]   Please send me a message in MyChart with any questions or updates.  Please see me back in 4 weeks.   --Dr. Jordan Likes

## 2020-10-06 NOTE — Assessment & Plan Note (Addendum)
Recurrent radicular type pain down the right leg.  Recent MRI demonstrating impingement on the S1 nerve root.  Pain can be 10 out of 10 and she has tried physical therapy home exercises as well as medications. -Counseled on home exercise therapy and supportive care. -Back brace. -Has physical therapy scheduled. -Epidural.

## 2020-10-06 NOTE — Assessment & Plan Note (Signed)
Significant improvement and normal function. -Counseled on home exercise therapy and supportive care. -Follow-up as needed.

## 2020-10-06 NOTE — Progress Notes (Addendum)
  Katie Spencer - 40 y.o. female MRN [id number]  Date of birth: November 19, 1980  SUBJECTIVE:  Including CC & ROS.  No chief complaint on file.   Katie Spencer is a 40 y.o. female that is following up for her left wrist pain and having acute on chronic low back pain with right-sided radicular type pain.   Review of Systems See HPI   HISTORY: Past Medical, Surgical, Social, and Family History Reviewed & Updated per EMR.   Pertinent Historical Findings include:  Past Medical History:  Diagnosis Date  . Abnormal vaginal bleeding   . Chronic abdominal pain     Past Surgical History:  Procedure Laterality Date  . ABDOMINAL HYSTERECTOMY    . CESAREAN SECTION  2006, 2009, 2013, 2014  . CHOLECYSTECTOMY      No family history on file.  Social History   Socioeconomic History  . Marital status: Married    Spouse name: Not on file  . Number of children: Not on file  . Years of education: Not on file  . Highest education level: Not on file  Occupational History  . Not on file  Tobacco Use  . Smoking status: Current Every Day Smoker    Packs/day: 1.00    Types: Cigarettes  . Smokeless tobacco: Never Used  Vaping Use  . Vaping Use: Never used  Substance and Sexual Activity  . Alcohol use: Not Currently  . Drug use: Never  . Sexual activity: Not on file  Other Topics Concern  . Not on file  Social History Narrative  . Not on file   Social Determinants of Health   Financial Resource Strain: Not on file  Food Insecurity: Not on file  Transportation Needs: Not on file  Physical Activity: Not on file  Stress: Not on file  Social Connections: Not on file  Intimate Partner Violence: Not on file     PHYSICAL EXAM:  VS: BP 120/78   Ht 5\' 4"  (1.626 m)   Wt 234 lb (106.1 kg)   LMP 04/04/2020   BMI 40.17 kg/m  Physical Exam Gen: NAD, alert, cooperative with exam, well-appearing MSK:  Left wrist: Normal range of motion. No swelling or ecchymosis. Normal strength  resistance. Back: Positive straight leg raise Neurovascular intact     ASSESSMENT & PLAN:   Wrist sprain, left, subsequent encounter Significant improvement and normal function. -Counseled on home exercise therapy and supportive care. -Follow-up as needed.   Lumbar radiculopathy Recurrent radicular type pain down the right leg.  Recent MRI demonstrating impingement on the S1 nerve root.  Pain can be 10 out of 10 and she has tried physical therapy home exercises as well as medications. -Counseled on home exercise therapy and supportive care. -Back brace. -Has physical therapy scheduled. -Epidural.

## 2020-10-20 ENCOUNTER — Encounter: Payer: Self-pay | Admitting: Physical Therapy

## 2020-10-20 ENCOUNTER — Telehealth: Payer: Self-pay | Admitting: Family Medicine

## 2020-10-20 ENCOUNTER — Ambulatory Visit: Payer: Medicaid Other | Attending: Orthopedic Surgery | Admitting: Physical Therapy

## 2020-10-20 ENCOUNTER — Other Ambulatory Visit: Payer: Self-pay

## 2020-10-20 DIAGNOSIS — M545 Low back pain, unspecified: Secondary | ICD-10-CM | POA: Diagnosis not present

## 2020-10-20 DIAGNOSIS — M62830 Muscle spasm of back: Secondary | ICD-10-CM | POA: Insufficient documentation

## 2020-10-20 NOTE — Telephone Encounter (Signed)
Pt informed of below.  She will call us with the preferred procedure location when she speaks with [facility].

## 2020-10-20 NOTE — Therapy (Signed)
[facility] [address]  [address] [place], [place], [address] Phone: [phone]   Fax:  [phone]  Physical Therapy Evaluation  Patient Details  Name: Katie Spencer MRN: [id number] Date of Birth: 28-Jan-1981 Referring Provider (PT): Lenn Cal Date: 10/20/2020   PT End of Session - 10/20/20 1059    Visit Number 1    Number of Visits 12    Date for PT Re-Evaluation 12/05/20    Authorization Type [employer] medicaid    PT Start Time 1012    PT Stop Time 1058    PT Time Calculation (min) 46 min    Activity Tolerance Patient tolerated treatment well    Behavior During Therapy [facility] for tasks assessed/performed           Past Medical History:  Diagnosis Date  . Abnormal vaginal bleeding   . Chronic abdominal pain     Past Surgical History:  Procedure Laterality Date  . ABDOMINAL HYSTERECTOMY    . CESAREAN SECTION  2006, 2009, 2013, 2014  . CHOLECYSTECTOMY      There were no vitals filed for this visit.    Subjective Assessment - 10/20/20 1017    Subjective Patient report that she has had some LBP for about 10 years, whe was lifting a couch and "back went out", reports that about every 6 months she would have the pain again for a few days and then it would resolve. Reports that she is unsure of why the flare up except she was twisting with a laundry basket.  MRI showed some SI impingement. She reports that recently the pain has just continued to be aggravating, she reports that she has a new back brace that is helping    Limitations Lifting;Standing;Walking;House hold activities    Patient Stated Goals have less pain, feel stronger, maybe go to a gym to help be stronger but be safe    Currently in Pain? Yes    Pain Score 4     Pain Location Back    Pain Orientation Lower    Pain Descriptors / Indicators Sharp;Aching    Pain Type Acute pain    Pain Radiating Towards denies    Pain Onset More than a month  ago    Pain Frequency Constant    Aggravating Factors  bending, twisting, pain can be up to 10/10    Pain Relieving Factors brace helps, heat helps pain can be down to a 3/10, taking meloxicam    Effect of Pain on Daily Activities difficulty bending, difficulty with housework and laundry              [facility] PT Assessment - 10/20/20 0001      Assessment   Medical Diagnosis LBP, SI impingement    Referring Provider (PT) Jordan Likes    Onset Date/Surgical Date 09/19/20    Prior Therapy no      Precautions   Precautions None      Balance Screen   Has the patient fallen in the past 6 months No    Has the patient had a decrease in activity level because of a fear of falling?  No    Is the patient reluctant to leave their home because of a fear of falling?  No      Home Environment   Additional Comments has stairs, does housework has a 25 and 41 year old at home      Prior Function   Level of  Independence Independent    Vocation Unemployed    Leisure was going to the gym about a year ago, 2-3 x /week reports did a circuit      Posture/Postural Control   Posture Comments fwd head, rounded shoulders, in brace today fo rthe lumbar spine      ROM / Strength   AROM / PROM / Strength AROM;Strength      AROM   Overall AROM Comments lumbar ROM decreased 75% for all motions with pain, extension and left side bending caused the worst pain      Strength   Overall Strength Comments right LE 4+/5 , left LE 4/5 had pain with both MMT but pain was worse on the left causing decreased strength      Flexibility   Soft Tissue Assessment /Muscle Length yes    Hamstrings slight tightness    Piriformis tight with pain on the left      Palpation   Palpation comment tight in the lumbar paraspinals, tender at the L/S area and into the buttocks, appears to have a diastisis recti abut 2 finger widths                      Objective measurements completed on examination: See above  findings.                 PT Short Term Goals - 10/20/20 1107      PT SHORT TERM GOAL #1   Title indepednent with initial HEP    Time 2    Period Weeks    Status New             PT Long Term Goals - 10/20/20 1144      PT LONG TERM GOAL #1   Title understand posture and body mechanics    Time 8    Period Weeks    Status New      PT LONG TERM GOAL #2   Title incresae lumbar ROM 50%    Time 8    Period Weeks    Status New      PT LONG TERM GOAL #3   Title decrease pain overall 50%    Time 8    Period Weeks    Status New      PT LONG TERM GOAL #4   Title incresae strength to 4+/5 bilateral LE's    Time 8    Period Weeks    Status New                  Plan - 10/20/20 1102    Clinical Impression Statement Patient reports pain in the low back off and on for about 10 years, reports that it would usually last a few days and go away, reports that aobut a month ago the pain really has not subsided, she had an MRI that showed SI impingement.  She has spasms in the lumbar area is tender in the L/S, SI and buttocks, she has tight hamstring and piriformis, very limited lumbar ROM, she has significant pain with bed mobility.  She has a weak core.    Stability/Clinical Decision Making Stable/Uncomplicated    Clinical Decision Making Low    Rehab Potential Good    PT Frequency 2x / week    PT Duration 6 weeks    PT Treatment/Interventions ADLs/Self Care Home Management;Cryotherapy;Electrical Stimulation;Moist Heat;Traction;Gait training;Stair training;Functional mobility training;Therapeutic activities;Therapeutic exercise;Neuromuscular re-education;Manual techniques;Patient/family education;Dry needling    PT Next Visit  Plan slowly start core activation and gentle mobility, needs stability and education on posture and body mechanics    Consulted and Agree with Plan of Care Patient           Patient will benefit from skilled therapeutic intervention in  order to improve the following deficits and impairments:  Decreased range of motion,Difficulty walking,Increased muscle spasms,Decreased activity tolerance,Pain,Improper body mechanics,Impaired flexibility,Postural dysfunction,Decreased strength  Visit Diagnosis: Acute bilateral low back pain without sciatica - Plan: PT plan of care cert/re-cert  Muscle spasm of back - Plan: PT plan of care cert/re-cert     Problem List Patient Active Problem List   Diagnosis Date Noted  . Lumbar radiculopathy 10/06/2020  . Wrist sprain, left, subsequent encounter 09/22/2020    Jearld Lesch., PT 10/20/2020, 11:54 AM  [facility] [address]  [address] [place], [place], [address] Phone: [phone]   Fax:  [phone]  Name: Katie Spencer MRN: [id number] Date of Birth: Sep 15, 1981

## 2020-10-20 NOTE — Patient Instructions (Signed)
Access Code: [email or web address] URL: [email or web address] Date: 10/20/2020 Prepared by: Stacie Glaze  Exercises Supine Pelvic Floor Contraction - 2 x daily - 7 x weekly - 1 sets - 10 reps - 10 hold Supine Piriformis Stretch Pulling Heel to Hip - 2 x daily - 7 x weekly - 1 sets - 10 reps - 30 hold Hooklying Single Knee to Chest Stretch - 2 x daily - 7 x weekly - 1 sets - 10 reps - 10 hold Supine Lower Trunk Rotation - 2 x daily - 7 x weekly - 1 sets - 10 reps - 10 hold Supine Posterior Pelvic Tilt - 2 x daily - 7 x weekly - 1 sets - 10 reps - 10 hold

## 2020-10-20 NOTE — Telephone Encounter (Signed)
Victorino Dike @ [employer] called states they received a Denial from [facility] plan) stating they are Out of Network for pt.   Epidural cannot be done there & it has been cancelled.  --Ask if we will call pt to inform of procedure cancellation.  --Forwarding message to med asst/ provider to Re-order it elsewhere.   --glh

## 2020-10-20 NOTE — Telephone Encounter (Signed)
Rcvd call frm Sheri @ [facility] ([phone] called to advise [facility] or [facility] out of Network (but maybe in network under [facility] if TIN didn't change she will call Victorino Dike back @ [facility] to clarify P)[phone]  This is a FYI.  -glh

## 2020-10-21 NOTE — Addendum Note (Signed)
Addended by: Jearld Lesch on: 10/21/2020 11:42 AM   Modules accepted: Orders

## 2020-10-24 ENCOUNTER — Encounter (HOSPITAL_COMMUNITY): Payer: Self-pay

## 2020-10-24 ENCOUNTER — Other Ambulatory Visit: Payer: Self-pay

## 2020-10-24 ENCOUNTER — Emergency Department (HOSPITAL_COMMUNITY): Payer: Medicaid Other

## 2020-10-24 ENCOUNTER — Emergency Department (HOSPITAL_COMMUNITY)
Admission: EM | Admit: 2020-10-24 | Discharge: 2020-10-24 | Disposition: A | Discharge status: Home / Self Care | Payer: Medicaid Other | Attending: Emergency Medicine | Admitting: Emergency Medicine

## 2020-10-24 DIAGNOSIS — R1011 Right upper quadrant pain: Secondary | ICD-10-CM | POA: Insufficient documentation

## 2020-10-24 DIAGNOSIS — N201 Calculus of ureter: Secondary | ICD-10-CM

## 2020-10-24 DIAGNOSIS — F17210 Nicotine dependence, cigarettes, uncomplicated: Secondary | ICD-10-CM | POA: Diagnosis not present

## 2020-10-24 DIAGNOSIS — R112 Nausea with vomiting, unspecified: Secondary | ICD-10-CM | POA: Diagnosis not present

## 2020-10-24 DIAGNOSIS — R1013 Epigastric pain: Secondary | ICD-10-CM | POA: Diagnosis present

## 2020-10-24 DIAGNOSIS — R109 Unspecified abdominal pain: Secondary | ICD-10-CM

## 2020-10-24 LAB — URINALYSIS, ROUTINE W REFLEX MICROSCOPIC
Bilirubin Urine: NEGATIVE
Glucose, UA: NEGATIVE mg/dL
Ketones, ur: NEGATIVE mg/dL
Leukocytes,Ua: NEGATIVE
Nitrite: NEGATIVE
Protein, ur: NEGATIVE mg/dL
RBC / HPF: >50 RBC/hpf — ABNORMAL HIGH (ref 0–5)
Specific Gravity, Urine: 1.017 (ref 1.005–1.030)
pH: 6 (ref 5.0–8.0)

## 2020-10-24 LAB — COMPREHENSIVE METABOLIC PANEL
ALT: 92 U/L — ABNORMAL HIGH (ref 0–44)
AST: 69 U/L — ABNORMAL HIGH (ref 15–41)
Albumin: 4 g/dL (ref 3.5–5.0)
Alkaline Phosphatase: 120 U/L (ref 38–126)
Anion gap: 10 (ref 5–15)
BUN: 10 mg/dL (ref 6–20)
CO2: 22 mmol/L (ref 22–32)
Calcium: 9.4 mg/dL (ref 8.9–10.3)
Chloride: 103 mmol/L (ref 98–111)
Creatinine, Ser: 0.61 mg/dL (ref 0.44–1.00)
GFR, Estimated: >60 mL/min (ref >60)
Glucose, Bld: 158 mg/dL — ABNORMAL HIGH (ref 70–99)
Potassium: 3.6 mmol/L (ref 3.5–5.1)
Sodium: 135 mmol/L (ref 135–145)
Total Bilirubin: 0.5 mg/dL (ref 0.3–1.2)
Total Protein: 7.2 g/dL (ref 6.5–8.1)

## 2020-10-24 LAB — CBC
HCT: 34.7 % — ABNORMAL LOW (ref 36.0–46.0)
Hemoglobin: 12.5 g/dL (ref 12.0–15.0)
MCH: 32.8 pg (ref 26.0–34.0)
MCHC: 36 g/dL (ref 30.0–36.0)
MCV: 91.1 fL (ref 80.0–100.0)
Platelets: 224 10*3/uL (ref 150–400)
RBC: 3.81 MIL/uL — ABNORMAL LOW (ref 3.87–5.11)
RDW: 12.2 % (ref 11.5–15.5)
WBC: 9 10*3/uL (ref 4.0–10.5)
nRBC: 0 % (ref 0.0–0.2)

## 2020-10-24 LAB — POC OCCULT BLOOD, ED: Fecal Occult Bld: NEGATIVE

## 2020-10-24 LAB — LIPASE, BLOOD: Lipase: 36 U/L (ref 11–51)

## 2020-10-24 MED ORDER — ONDANSETRON HCL 4 MG/2ML IJ SOLN
4.0000 mg | Freq: Once | INTRAMUSCULAR | Status: AC
  Administered 2020-10-24: 4 mg via INTRAVENOUS
  Filled 2020-10-24: qty 2

## 2020-10-24 MED ORDER — OXYCODONE-ACETAMINOPHEN 5-325 MG PO TABS: 1.0000 | ORAL_TABLET | Freq: Four times a day (QID) | ORAL | 0 refills | Status: DC | PRN

## 2020-10-24 MED ORDER — TAMSULOSIN HCL 0.4 MG PO CAPS: 0.4000 mg | ORAL_CAPSULE | Freq: Every day | ORAL | 0 refills | Status: DC

## 2020-10-24 MED ORDER — PANTOPRAZOLE SODIUM 40 MG IV SOLR
40.0000 mg | Freq: Once | INTRAVENOUS | Status: AC
  Administered 2020-10-24: 40 mg via INTRAVENOUS
  Filled 2020-10-24: qty 40

## 2020-10-24 MED ORDER — HYDROMORPHONE HCL 1 MG/ML IJ SOLN
0.5000 mg | Freq: Once | INTRAMUSCULAR | Status: AC
  Administered 2020-10-24: 0.5 mg via INTRAVENOUS
  Filled 2020-10-24: qty 1

## 2020-10-24 MED ORDER — LIDOCAINE VISCOUS HCL 2 % MT SOLN
15.0000 mL | Freq: Once | OROMUCOSAL | Status: AC
  Administered 2020-10-24: 15 mL via ORAL
  Filled 2020-10-24: qty 15

## 2020-10-24 MED ORDER — PANTOPRAZOLE SODIUM 40 MG PO TBEC: 40.0000 mg | DELAYED_RELEASE_TABLET | Freq: Two times a day (BID) | ORAL | 0 refills | Status: DC

## 2020-10-24 MED ORDER — IOHEXOL 300 MG/ML  SOLN
100.0000 mL | Freq: Once | INTRAMUSCULAR | Status: AC | PRN
  Administered 2020-10-24: 100 mL via INTRAVENOUS

## 2020-10-24 MED ORDER — MORPHINE SULFATE (PF) 4 MG/ML IV SOLN
4.0000 mg | Freq: Once | INTRAVENOUS | Status: AC
  Administered 2020-10-24: 4 mg via INTRAVENOUS
  Filled 2020-10-24: qty 1

## 2020-10-24 MED ORDER — SODIUM CHLORIDE 0.9 % IV BOLUS
1000.0000 mL | Freq: Once | INTRAVENOUS | Status: AC
  Administered 2020-10-24: 1000 mL via INTRAVENOUS

## 2020-10-24 MED ORDER — ONDANSETRON 4 MG PO TBDP: ORAL_TABLET | ORAL | 0 refills | Status: DC

## 2020-10-24 MED ORDER — ALUM & MAG HYDROXIDE-SIMETH 200-200-20 MG/5ML PO SUSP
30.0000 mL | Freq: Once | ORAL | Status: AC
  Administered 2020-10-24: 30 mL via ORAL
  Filled 2020-10-24: qty 30

## 2020-10-24 NOTE — ED Triage Notes (Signed)
Pt c/o upper abdominal pain radiating down both sides into lower back.

## 2020-10-24 NOTE — ED Notes (Signed)
Patient given crackers and something to drink

## 2020-10-24 NOTE — ED Provider Notes (Signed)
[facility] DEPT Provider Note   CSN: [id number] Arrival date & time: 10/24/20  0212     History Chief Complaint  Patient presents with  . Abdominal Pain    Katie Spencer is a 40 y.o. female.  Katie Spencer is a 40 y.o. female with history of chronic low back pain and cholecystitis s/p cholecystectomy in September 2020 who presents to ED with abdominal pain. She states she has had upper abdominal pain for about two weeks. She states that tonight she was unable to relax and sleep. She tried some PeptoBismol at midnight which she states did not help. She went to drug store and then tried Mylanta which she states she vomited up around 0100 which prompted her to come to the ED. She describes abdominal pain as constant and gnawing with night sweats.  She is not sure whether or not eating makes it worse.  No alleviating factors. She endorses nausea and dark appearing stool recently. Endorses feeling bloated. On further questioning she states that she has been taking ibuprofen 600-800mg  x2 daily for the past couple of weeks for her chronic low back pain. Denies chronic alcohol use. Denies fevers, constipation, or urinary symptoms.  Does report the pain intermittently radiates back to her flanks, in particular on the right.  She has not noted any blood in the stool.  She has had a hysterectomy and denies any vaginal bleeding or discharge.  Anecdotally, she states her PT noted a midline diastasis that she is concerned about.         Past Medical History:  Diagnosis Date  . Abnormal vaginal bleeding   . Chronic abdominal pain     Patient Active Problem List   Diagnosis Date Noted  . Lumbar radiculopathy 10/06/2020  . Wrist sprain, left, subsequent encounter 09/22/2020    Past Surgical History:  Procedure Laterality Date  . ABDOMINAL HYSTERECTOMY    . CESAREAN SECTION  2006, 2009, 2013, 2014  . CHOLECYSTECTOMY       OB History   No obstetric  history on file.     No family history on file.  Social History   Tobacco Use  . Smoking status: Current Every Day Smoker    Packs/day: 1.00    Types: Cigarettes  . Smokeless tobacco: Never Used  Vaping Use  . Vaping Use: Never used  Substance Use Topics  . Alcohol use: Not Currently  . Drug use: Never    Home Medications Prior to Admission medications   Medication Sig Start Date End Date Taking? Authorizing Provider  acetaminophen (TYLENOL) 500 MG tablet Take 1,000 mg by mouth every 6 (six) hours as needed for moderate pain or headache.    [provider]  citalopram (CELEXA) 40 MG tablet Take 40 mg by mouth daily. 01/01/20   [provider]  cyclobenzaprine (FLEXERIL) 10 MG tablet Take 1 tablet (10 mg total) by mouth at bedtime. 09/20/20   Rayna Sexton, PA-C  diazepam (VALIUM) 5 MG tablet Take 1 tablet (5 mg total) by mouth every 8 (eight) hours as needed for muscle spasms. 09/30/20   Molpus, John, MD  docusate sodium (COLACE) 100 MG capsule Take 100 mg by mouth 2 (two) times daily.  04/30/20   [provider]  hydrOXYzine (ATARAX/VISTARIL) 25 MG tablet Take 25 mg by mouth 3 (three) times daily as needed for anxiety.  04/30/20   [provider]  ibuprofen (ADVIL) 800 MG tablet Take 1 tablet (800 mg total) by mouth every  8 (eight) hours as needed. 09/20/20   Rayna Sexton, PA-C  PROAIR HFA 108 (90 Base) MCG/ACT inhaler Inhale 2 puffs into the lungs every 4 (four) hours as needed for wheezing or shortness of breath.  Patient not taking: Reported on 10/20/2020 04/30/20   [provider]  VRAYLAR 6 MG CAPS Take 6 mg by mouth at bedtime.  01/02/20   [provider]  belladonna-opium (B&O SUPPRETTES) 16.2-30 MG suppository Place 1 suppository rectally every 8 (eight) hours as needed for pain. Patient not taking: Reported on 05/27/2020 05/20/20 08/18/20  Orpah Greek, MD  lansoprazole (PREVACID) 30 MG capsule Take 1 capsule daily  while taking ibuprofen. Patient not taking: Reported on 05/27/2020 04/16/20 08/18/20  Molpus, Jenny Reichmann, MD    Allergies    Patient has no known allergies.  Review of Systems   Review of Systems  Constitutional: Negative for chills and fever.  HENT: Negative.   Respiratory: Negative for cough and shortness of breath.   Cardiovascular: Negative for chest pain.  Gastrointestinal: Positive for abdominal pain, nausea and vomiting. Negative for blood in stool, constipation and diarrhea.  Genitourinary: Positive for flank pain. Negative for dysuria, frequency, hematuria, pelvic pain, vaginal bleeding and vaginal discharge.  Musculoskeletal: Negative for arthralgias and myalgias.  Skin: Negative for color change and rash.  Neurological: Negative for dizziness, syncope and light-headedness.  All other systems reviewed and are negative.   Physical Exam Updated Vital Signs BP 134/83 (BP Location: Left Arm)   Pulse 90   Temp 98.3 F (36.8 C) (Oral)   Resp 10   LMP 04/04/2020   SpO2 98%   Physical Exam Vitals and nursing note reviewed.  Constitutional:      General: She is not in acute distress.    Appearance: She is well-developed and well-nourished. She is obese. She is not diaphoretic.     Comments: Alert, appears uncomfortable, but in no acute distress  HENT:     Head: Normocephalic and atraumatic.     Mouth/Throat:     Mouth: Oropharynx is clear and moist. Mucous membranes are moist.     Pharynx: Oropharynx is clear.  Eyes:     General:        Right eye: No discharge.        Left eye: No discharge.     Extraocular Movements: EOM normal.     Pupils: Pupils are equal, round, and reactive to light.  Cardiovascular:     Rate and Rhythm: Normal rate and regular rhythm.     Pulses: Intact distal pulses.     Heart sounds: Normal heart sounds. No murmur heard. No friction rub. No gallop.   Pulmonary:     Effort: Pulmonary effort is normal. No respiratory distress.     Breath sounds:  Normal breath sounds. No wheezing or rales.     Comments: Respirations equal and unlabored, patient able to speak in full sentences, lungs clear to auscultation bilaterally  Abdominal:     General: Abdomen is protuberant. Bowel sounds are normal.     Palpations: Abdomen is soft. There is no mass.     Tenderness: There is abdominal tenderness in the right upper quadrant, epigastric area and left upper quadrant. There is no guarding.     Comments: Abdomen is protuberant, but soft, rectus diastases noted, bowel sounds present throughout, there is tenderness across the upper abdomen, most significant in the epigastric region, no lower abdominal tenderness, no guarding or peritoneal signs.  Patient does have some  mild tenderness over the flanks, worse on the right.  Musculoskeletal:        General: No deformity or edema.     Cervical back: Neck supple.  Skin:    General: Skin is warm and dry.     Capillary Refill: Capillary refill takes less than 2 seconds.  Neurological:     Mental Status: She is alert.     Coordination: Coordination normal.     Comments: Speech is clear, able to follow commands Moves extremities without ataxia, coordination intact  Psychiatric:        Mood and Affect: Mood normal.        Behavior: Behavior normal.     ED Results / Procedures / Treatments   Labs (all labs ordered are listed, but only abnormal results are displayed) Labs Reviewed  COMPREHENSIVE METABOLIC PANEL - Abnormal; Notable for the following components:      Result Value   Glucose, Bld 158 (*)    AST 69 (*)    ALT 92 (*)    All other components within normal limits  CBC - Abnormal; Notable for the following components:   RBC 3.81 (*)    HCT 34.7 (*)    All other components within normal limits  URINALYSIS, ROUTINE W REFLEX MICROSCOPIC - Abnormal; Notable for the following components:   Hgb urine dipstick MODERATE (*)    RBC / HPF >50 (*)    Bacteria, UA RARE (*)    All other components  within normal limits  LIPASE, BLOOD    EKG None  Radiology CT ABDOMEN PELVIS W CONTRAST  Result Date: 10/24/2020 CLINICAL DATA:  Epigastric pain. EXAM: CT ABDOMEN AND PELVIS WITH CONTRAST TECHNIQUE: Multidetector CT imaging of the abdomen and pelvis was performed using the standard protocol following bolus administration of intravenous contrast. CONTRAST:  192mL OMNIPAQUE IOHEXOL 300 MG/ML  SOLN COMPARISON:  CT the abdomen and pelvis 08/04/2020 at [place]: Lower chest: The lung bases are clear without focal nodule, mass, or airspace disease. The heart size is normal. No significant pleural or pericardial effusion is present. Hepatobiliary: Diffuse fatty infiltration of the liver demonstrates progressive hypoattenuation. No discrete lesions are present. The common bile duct is within normal limits following cholecystectomy. Pancreas: Unremarkable. No pancreatic ductal dilatation or surrounding inflammatory changes. Spleen: Normal in size without focal abnormality. Adrenals/Urinary Tract: Adrenal glands are normal bilaterally. 3.5 mm stone is present in the right ureter at the UPJ. No significant hydronephrosis is present. No additional stones are present. There is some inflammatory change about the ureter. Distal ureter is within normal limits. Left kidney and ureter are unremarkable. The urinary bladder is within normal limits. No additional stones are present. Stomach/Bowel: Stomach and duodenum are within normal limits. The small bowel is unremarkable. The terminal ileum is within normal limits. The appendix is visualized and normal. The ascending and transverse colon are within normal limits. The descending and sigmoid colon are normal. Vascular/Lymphatic: No significant vascular findings are present. No enlarged abdominal or pelvic lymph nodes. Reproductive: Status post hysterectomy. No adnexal masses. Other: Subcutaneous stranding over the upper abdomen has changed slightly  in position and is likely related to subcutaneous injections. No significant fluid collection is present. The collection anterior to the bladder is markedly decreased in size since the prior exam. The collection now measures 2.6 x 1.7 cm on axial images, likely representing resolving hematoma. Musculoskeletal: Vertebral body heights and alignment are normal. No focal lytic or blastic lesions are present.  The bony pelvis is within normal limits. Hips are located and normal. IMPRESSION: 1. 3.5 mm stone in the right ureter at the UPJ without significant hydronephrosis. This is the most likely source of the patient's pain. 2. No additional stones. 3. Progressive fatty infiltration of the liver. 4. Cholecystectomy and hysterectomy. 5. Resolving hematoma anterior to the bladder. Electronically Signed   By: San Morelle M.D.   On: 10/24/2020 10:02    Procedures Procedures   Medications Ordered in ED Medications  sodium chloride 0.9 % bolus 1,000 mL (0 mLs Intravenous Stopped 10/24/20 1030)  ondansetron (ZOFRAN) injection 4 mg (4 mg Intravenous Given 10/24/20 0857)  morphine 4 MG/ML injection 4 mg (4 mg Intravenous Given 10/24/20 0856)  alum & mag hydroxide-simeth (MAALOX/MYLANTA) 200-200-20 MG/5ML suspension 30 mL (30 mLs Oral Given 10/24/20 0857)    And  lidocaine (XYLOCAINE) 2 % viscous mouth solution 15 mL (15 mLs Oral Given 10/24/20 0857)  pantoprazole (PROTONIX) injection 40 mg (40 mg Intravenous Given 10/24/20 0857)  iohexol (OMNIPAQUE) 300 MG/ML solution 100 mL (100 mLs Intravenous Contrast Given 10/24/20 0922)  HYDROmorphone (DILAUDID) injection 0.5 mg (0.5 mg Intravenous Given 10/24/20 1023)    ED Course  I have reviewed the triage vital signs and the nursing notes.  Pertinent labs & imaging results that were available during my care of the patient were reviewed by me and considered in my medical decision making (see chart for details).    MDM Rules/Calculators/A&P                           Patient presents to the ED with complaints of abdominal pain. Patient nontoxic appearing, in no apparent distress, vitals unremarkable aside from mild HTN. On exam patient tender to palpation across the upper abdomen as well as some mild flank tenderness, no peritoneal signs. Will evaluate with labs and CT abd pelvis. Analgesics, anti-emetics, and fluids administered.   Differential includes: PUD, gastritis, GERD, cholecystitis, pancreatitis, diverticulitis, appendicitis, bowel obstruction/perforation, UTI, nephrolithiasis, dissection   I have independently ordered, reviewed and interpreted all labs and imaging: CBC: No leukocytosis, normal hemoglobin CMP: Glucose of 158 but no other significant electrolyte derangements, normal renal function, mildly elevated AST and ALT which is new compared to previous lab work, normal bili and alk phos.  Patient without focal right upper quadrant tenderness but has tenderness throughout the upper abdomen Lipase: WNL UA: Hematuria noted with greater than 50 RBCs but no signs of infection, patient does have some flank pain, raises concern for potential kidney stone.  CT with 3.5 mm stone at the right UPJ without significant hydro-, likely contributing to patient's pain, no additional stones noted.  Patient has progressive fatty infiltration of the liver, this could potentially explain patient's mild transaminitis.  No evidence of biliary dilatation after prior cholecystectomy.  No other acute abnormalities.  Certainly think that kidney stone is contributing to patient's pain but given severe epigastric pain as well I still have some concern for potential gastritis, GERD or peptic ulcer.  In addition to treating with pain medication, Zofran for nausea and Flomax to ease passing of stone, will also start patient on PPI, have her avoid using NSAIDs, spicy or acidic foods or alcohol and have her follow-up with GI as well as urology.  I discussed this plan as well as  instructions for follow-up and strict return precautions with the patient and she expresses understanding and agreement with plan.  Her pain is significantly  improved with management here in the ED and she is tolerating p.o. at this time she is stable for discharge home in good condition.  Final Clinical Impression(s) / ED Diagnoses Final diagnoses:  Epigastric pain  Flank pain  Ureteral stone    Rx / DC Orders ED Discharge Orders         Ordered    oxyCODONE-acetaminophen (PERCOCET) 5-325 MG tablet  Every 6 hours PRN        10/24/20 1124    tamsulosin (FLOMAX) 0.4 MG CAPS capsule  Daily        10/24/20 1124    ondansetron (ZOFRAN ODT) 4 MG disintegrating tablet        10/24/20 1124    pantoprazole (PROTONIX) 40 MG tablet  2 times daily        10/24/20 1124           Benedetto Goad [place], [place] 10/27/20 1431    Malvin Johns, MD 11/04/20 0840

## 2020-10-24 NOTE — Discharge Instructions (Signed)
You were seen in the emergency department and found to have a kidney stone.  We are sending you home with multiple medications to assist with passing the stone:   -Flomax-this is a medication to help pass the stone, it allows urine to exit the body more freely.  Please take this once daily with a meal.  -Percocet-this is a narcotic/controlled substance medication that has potential addicting qualities.  We recommend that you take 1-2 tablets every 6 hours as needed for severe pain.  Do not drive or operate heavy machinery when taking this medicine as it can be sedating. Do not drink alcohol or take other sedating medications when taking this medicine for safety reasons.  Keep this out of reach of small children.  Please be aware this medicine has Tylenol in it (325 mg/tab) do not exceed the maximum dose of Tylenol in a day per over the counter recommendations should you decide to supplement with Tylenol over the counter.   -Zofran-this is an antinausea medication, you may take this every 8 hours as needed for nausea and vomiting, please allow the tablet to dissolve underneath of your tongue.   With your upper abdominal pain I am also concerned you may have gastritis or an ulcer, please avoid any NSAIDs such as ibuprofen, Aleve, aspirin as these can worsen the symptoms, you can also avoid acidic or spicy foods.  I would like for you to take Protonix twice daily.  I would like for you to follow-up with GI for further evaluation of this epigastric pain, if it is not improving you may need an endoscopy to look for potential ulcer.  If you notice dark black or bright red blood in your stools, significantly worsening abdominal pain, fevers, you should return to the emergency department.   We have prescribed you new medication(s) today. Discuss the medications prescribed today with your pharmacist as they can have adverse effects and interactions with your other medicines including over the counter and  prescribed medications. Seek medical evaluation if you start to experience new or abnormal symptoms after taking one of these medicines, seek care immediately if you start to experience difficulty breathing, feeling of your throat closing, facial swelling, or rash as these could be indications of a more serious allergic reaction  Please follow-up with the urology group provided in your discharge instructions within 3 to 5 days.  Return to the ER for new or worsening symptoms including but not limited to worsening pain not controlled by these medicines, inability to keep fluids down, fever, or any other concerns that you may have.

## 2020-10-27 ENCOUNTER — Encounter: Payer: Self-pay | Admitting: Physical Therapy

## 2020-10-27 ENCOUNTER — Other Ambulatory Visit: Payer: Self-pay

## 2020-10-27 ENCOUNTER — Ambulatory Visit: Payer: Medicaid Other | Admitting: Physical Therapy

## 2020-10-27 DIAGNOSIS — M545 Low back pain, unspecified: Secondary | ICD-10-CM | POA: Diagnosis not present

## 2020-10-27 DIAGNOSIS — M62830 Muscle spasm of back: Secondary | ICD-10-CM

## 2020-10-27 NOTE — Therapy (Signed)
[facility] [address]  [address] [place], [place], [address] Phone: [phone]   Fax:  [phone]  Physical Therapy Treatment  Patient Details  Name: Katie Spencer MRN: [id number] Date of Birth: April 18, 1981 Referring Provider (PT): Dumonski   Encounter Date: 10/27/2020   PT End of Session - 10/27/20 1142    Visit Number 2    Number of Visits 12    Date for PT Re-Evaluation 12/05/20    Authorization Type [employer] medicaid    PT Start Time 1059    PT Stop Time 1154    PT Time Calculation (min) 55 min    Activity Tolerance Patient tolerated treatment well    Behavior During Therapy [facility] for tasks assessed/performed           Past Medical History:  Diagnosis Date  . Abnormal vaginal bleeding   . Chronic abdominal pain     Past Surgical History:  Procedure Laterality Date  . ABDOMINAL HYSTERECTOMY    . CESAREAN SECTION  2006, 2009, 2013, 2014  . CHOLECYSTECTOMY      There were no vitals filed for this visit.   Subjective Assessment - 10/27/20 1106    Subjective Patient reports that the exercises seems to have helped loosen her up.  She did have an ED visit 10/24/20.  They found a kidney stone.  Sh eis reporting back pain left scapular area    Currently in Pain? Yes    Pain Score 4     Pain Location Scapula    Pain Orientation Left    Aggravating Factors  bending  twisting pain can be up to 8/10                             [facility] Adult PT Treatment/Exercise - 10/27/20 0001      Exercises   Exercises Lumbar      Lumbar Exercises: Stretches   Passive Hamstring Stretch Right;Left;3 reps;20 seconds    Piriformis Stretch Right;Left;3 reps;20 seconds      Lumbar Exercises: Aerobic   Nustep level 3 x 5 minutes      Lumbar Exercises: Standing   Other Standing Lumbar Exercises 3# shrugs with upper trap and levaotr stretches, back to wall self mobilizaiton and massage using balls, then showed  and educated her on the theracane    Other Standing Lumbar Exercises back to wall overhead ball raise      Lumbar Exercises: Supine   Other Supine Lumbar Exercises feet on ball K2C, small trunk rotations, small bridges and isometric abs      Modalities   Modalities Electrical Stimulation;Moist Heat      Moist Heat Therapy   Number Minutes Moist Heat 12 Minutes    Moist Heat Location Lumbar Spine      Electrical Stimulation   Electrical Stimulation Location lumbar area, left scapular area    Electrical Stimulation Action premod    Electrical Stimulation Parameters supine    Electrical Stimulation Goals Pain                  PT Education - 10/27/20 1141    Education Details educated on self massage using balls with back to wall, also demo and education of the theracane, when doing the supine ball exercises educated that this is appropriate for her to do at home    Person(s) Educated Patient    Methods Explanation;Demonstration;Tactile cues;Verbal cues  Comprehension Verbalized understanding;Returned demonstration;Verbal cues required;Tactile cues required            PT Short Term Goals - 10/27/20 1144      PT SHORT TERM GOAL #1   Title indepednent with initial HEP    Status Achieved             PT Long Term Goals - 10/20/20 1144      PT LONG TERM GOAL #1   Title understand posture and body mechanics    Time 8    Period Weeks    Status New      PT LONG TERM GOAL #2   Title incresae lumbar ROM 50%    Time 8    Period Weeks    Status New      PT LONG TERM GOAL #3   Title decrease pain overall 50%    Time 8    Period Weeks    Status New      PT LONG TERM GOAL #4   Title incresae strength to 4+/5 bilateral LE's    Time 8    Period Weeks    Status New                 Plan - 10/27/20 1142    Clinical Impression Statement Patient had an ED visit on Sunday due to kidney stone.  She has spasms in the lumbar and thoracic area, has pain in the  left scapular area.  I transitioned into advancing exercises and she tolerated well with a lot of times saying "hurts but really feels good" with the exercises.  She does need cues for posture and form with the exercises and some tactile cues for core activation    PT Next Visit Plan slowly start core activation and gentle mobility, needs stability and education on posture and body mechanics    Consulted and Agree with Plan of Care Patient           Patient will benefit from skilled therapeutic intervention in order to improve the following deficits and impairments:  Decreased range of motion,Difficulty walking,Increased muscle spasms,Decreased activity tolerance,Pain,Improper body mechanics,Impaired flexibility,Postural dysfunction,Decreased strength  Visit Diagnosis: Acute bilateral low back pain without sciatica  Muscle spasm of back     Problem List Patient Active Problem List   Diagnosis Date Noted  . Lumbar radiculopathy 10/06/2020  . Wrist sprain, left, subsequent encounter 09/22/2020    Jearld Lesch., PT 10/27/2020, 11:44 AM  [facility] [address]  [address] [place], [place], [address] Phone: [phone]   Fax:  [phone]  Name: Katie Spencer MRN: [id number] Date of Birth: 1981-03-02

## 2020-11-01 ENCOUNTER — Encounter (HOSPITAL_BASED_OUTPATIENT_CLINIC_OR_DEPARTMENT_OTHER): Payer: Self-pay

## 2020-11-01 ENCOUNTER — Emergency Department (HOSPITAL_BASED_OUTPATIENT_CLINIC_OR_DEPARTMENT_OTHER): Payer: Medicaid Other

## 2020-11-01 ENCOUNTER — Emergency Department (HOSPITAL_BASED_OUTPATIENT_CLINIC_OR_DEPARTMENT_OTHER)
Admission: EM | Admit: 2020-11-01 | Discharge: 2020-11-01 | Disposition: A | Discharge status: Home / Self Care | Payer: Medicaid Other | Attending: Emergency Medicine | Admitting: Emergency Medicine

## 2020-11-01 ENCOUNTER — Other Ambulatory Visit: Payer: Self-pay

## 2020-11-01 DIAGNOSIS — F17210 Nicotine dependence, cigarettes, uncomplicated: Secondary | ICD-10-CM | POA: Diagnosis not present

## 2020-11-01 DIAGNOSIS — M549 Dorsalgia, unspecified: Secondary | ICD-10-CM

## 2020-11-01 DIAGNOSIS — Z79899 Other long term (current) drug therapy: Secondary | ICD-10-CM | POA: Diagnosis not present

## 2020-11-01 DIAGNOSIS — N201 Calculus of ureter: Secondary | ICD-10-CM | POA: Insufficient documentation

## 2020-11-01 LAB — CBC WITH DIFFERENTIAL/PLATELET
Abs Immature Granulocytes: 0.05 10*3/uL (ref 0.00–0.07)
Basophils Absolute: 0.1 10*3/uL (ref 0.0–0.1)
Basophils Relative: 1 %
Eosinophils Absolute: 0.2 10*3/uL (ref 0.0–0.5)
Eosinophils Relative: 2 %
HCT: 35.7 % — ABNORMAL LOW (ref 36.0–46.0)
Hemoglobin: 13.1 g/dL (ref 12.0–15.0)
Immature Granulocytes: 1 %
Lymphocytes Relative: 32 %
Lymphs Abs: 3.1 10*3/uL (ref 0.7–4.0)
MCH: 33.1 pg (ref 26.0–34.0)
MCHC: 36.7 g/dL — ABNORMAL HIGH (ref 30.0–36.0)
MCV: 90.2 fL (ref 80.0–100.0)
Monocytes Absolute: 0.6 10*3/uL (ref 0.1–1.0)
Monocytes Relative: 6 %
Neutro Abs: 5.6 10*3/uL (ref 1.7–7.7)
Neutrophils Relative %: 58 %
Platelets: 250 10*3/uL (ref 150–400)
RBC: 3.96 MIL/uL (ref 3.87–5.11)
RDW: 11.7 % (ref 11.5–15.5)
WBC: 9.6 10*3/uL (ref 4.0–10.5)
nRBC: 0 % (ref 0.0–0.2)

## 2020-11-01 LAB — URINALYSIS, ROUTINE W REFLEX MICROSCOPIC
Bilirubin Urine: NEGATIVE
Glucose, UA: NEGATIVE mg/dL
Hgb urine dipstick: NEGATIVE
Ketones, ur: NEGATIVE mg/dL
Leukocytes,Ua: NEGATIVE
Nitrite: NEGATIVE
Protein, ur: NEGATIVE mg/dL
Specific Gravity, Urine: 1.02 (ref 1.005–1.030)
pH: 6.5 (ref 5.0–8.0)

## 2020-11-01 LAB — COMPREHENSIVE METABOLIC PANEL
ALT: 60 U/L — ABNORMAL HIGH (ref 0–44)
AST: 53 U/L — ABNORMAL HIGH (ref 15–41)
Albumin: 4.4 g/dL (ref 3.5–5.0)
Alkaline Phosphatase: 93 U/L (ref 38–126)
Anion gap: 10 (ref 5–15)
BUN: 12 mg/dL (ref 6–20)
CO2: 22 mmol/L (ref 22–32)
Calcium: 9.3 mg/dL (ref 8.9–10.3)
Chloride: 103 mmol/L (ref 98–111)
Creatinine, Ser: 0.7 mg/dL (ref 0.44–1.00)
GFR, Estimated: >60 mL/min (ref >60)
Glucose, Bld: 122 mg/dL — ABNORMAL HIGH (ref 70–99)
Potassium: 3.6 mmol/L (ref 3.5–5.1)
Sodium: 135 mmol/L (ref 135–145)
Total Bilirubin: 0.4 mg/dL (ref 0.3–1.2)
Total Protein: 7.3 g/dL (ref 6.5–8.1)

## 2020-11-01 MED ORDER — KETOROLAC TROMETHAMINE 60 MG/2ML IM SOLN
60.0000 mg | Freq: Once | INTRAMUSCULAR | Status: AC
  Administered 2020-11-01: 60 mg via INTRAMUSCULAR
  Filled 2020-11-01: qty 2

## 2020-11-01 MED ORDER — OXYCODONE-ACETAMINOPHEN 5-325 MG PO TABS: 1.0000 | ORAL_TABLET | Freq: Three times a day (TID) | ORAL | 0 refills | Status: DC | PRN

## 2020-11-01 MED ORDER — KETOROLAC TROMETHAMINE 15 MG/ML IJ SOLN
60.0000 mg | Freq: Once | INTRAMUSCULAR | Status: DC
  Filled 2020-11-01: qty 4

## 2020-11-01 NOTE — Discharge Instructions (Addendum)
Like we discussed, I prescribed you a another short course of Percocet.  This is a strong narcotic medication.  It can be constipating so please stay hydrated and consider taking a stool softener while using this medication.  Do not mix with alcohol.  Do not operate a motor vehicle after taking it.  Please make sure you go see your regular doctor in 2 days for your scheduled appointment.  I have also given you follow-up information below for a urologist in the [facility] area.  Please give them a call to schedule a follow-up appointment.  If your symptoms worsen, you can always return to the emergency department.  It was a pleasure to meet you.

## 2020-11-01 NOTE — ED Provider Notes (Signed)
[facility] EMERGENCY DEPARTMENT Provider Note   CSN: [id number] Arrival date & time: 11/01/20  1406     History Chief Complaint  Patient presents with   Flank Pain    Katie Spencer is a 40 y.o. female.  HPI   Patient is a 40 year old female with abnormal vaginal bleeding, chronic abdominal pain, status post hysterectomy as well as cholecystectomy, who presents to the emergency department due to back and abdominal pain.  Patient states that on February 6 she was diagnosed with a right-sided 3.5 mm kidney stone.  She was discharged with Percocet as well as Flomax.  She was also experiencing epigastric pain and was discharged on Protonix.  She has been taking all of her medications as prescribed.  She states that her back pain has persisted and last night began to worsen once again.  She states it is now on the left side along the left flank as well as the left lateral abdomen.  Pain worsens with palpation as well as when urinating.  Denies any dysuria but states that contracting her abdomen when urinating causes worsening pain.  No hematuria.  Mild difficulty urinating.  No fevers, chills, vomiting.  She does report some mild nausea.     Past Medical History:  Diagnosis Date   Abnormal vaginal bleeding    Chronic abdominal pain     Patient Active Problem List   Diagnosis Date Noted   Lumbar radiculopathy 10/06/2020   Wrist sprain, left, subsequent encounter 09/22/2020    Past Surgical History:  Procedure Laterality Date   ABDOMINAL HYSTERECTOMY     CESAREAN SECTION  2006, 2009, 2013, 2014   CHOLECYSTECTOMY       OB History   No obstetric history on file.     No family history on file.  Social History   Tobacco Use   Smoking status: Current Every Day Smoker    Packs/day: 1.00    Types: Cigarettes   Smokeless tobacco: Never Used  Vaping Use   Vaping Use: Never used  Substance Use Topics   Alcohol use: Not Currently   Drug use: Never     Home Medications Prior to Admission medications   Medication Sig Start Date End Date Taking? Authorizing Provider  citalopram (CELEXA) 40 MG tablet Take 40 mg by mouth daily. 01/01/20  Yes [provider]  cyclobenzaprine (FLEXERIL) 10 MG tablet Take 1 tablet (10 mg total) by mouth at bedtime. Patient taking differently: Take 10 mg by mouth daily as needed for muscle spasms. 09/20/20  Yes Placido Sou, PA-C  diazepam (VALIUM) 5 MG tablet Take 1 tablet (5 mg total) by mouth every 8 (eight) hours as needed for muscle spasms. 09/30/20  Yes Molpus, John, MD  docusate sodium (COLACE) 100 MG capsule Take 100 mg by mouth 2 (two) times daily as needed for mild constipation. 04/30/20  Yes [provider]  hydrOXYzine (ATARAX/VISTARIL) 25 MG tablet Take 25 mg by mouth 3 (three) times daily as needed for anxiety.  04/30/20  Yes [provider]  ibuprofen (ADVIL) 800 MG tablet Take 1 tablet (800 mg total) by mouth every 8 (eight) hours as needed. Patient taking differently: Take 800 mg by mouth every 8 (eight) hours as needed for moderate pain. 09/20/20  Yes Placido Sou, PA-C  methocarbamol (ROBAXIN) 500 MG tablet Take 500 mg by mouth daily as needed for muscle spasms. 10/05/20  Yes [provider]  MOBIC 15 MG tablet Take 15 mg by mouth daily as needed  for pain. 10/05/20  Yes [provider]  ondansetron (ZOFRAN ODT) 4 MG disintegrating tablet 4mg  ODT q4 hours prn nausea/vomit Patient taking differently: Take 4 mg by mouth every 4 (four) hours as needed for nausea or vomiting. 10/24/20  Yes 12/22/20, PA-C  oxyCODONE-acetaminophen (PERCOCET/ROXICET) 5-325 MG tablet Take 1 tablet by mouth every 8 (eight) hours as needed for severe pain. 11/01/20  Yes 11/03/20, PA-C  pantoprazole (PROTONIX) 40 MG tablet Take 1 tablet (40 mg total) by mouth 2 (two) times daily. 10/24/20 11/23/20 Yes 01/23/21, PA-C  tamsulosin (FLOMAX) 0.4 MG CAPS capsule Take 1 capsule  (0.4 mg total) by mouth daily. 10/24/20  Yes Ford, Kelsey N, PA-C  VRAYLAR 6 MG CAPS Take 6 mg by mouth at bedtime.  01/02/20  Yes [provider]  belladonna-opium (B&O SUPPRETTES) 16.2-30 MG suppository Place 1 suppository rectally every 8 (eight) hours as needed for pain. Patient not taking: Reported on 05/27/2020 05/20/20 08/18/20  14/1/21, MD  lansoprazole (PREVACID) 30 MG capsule Take 1 capsule daily while taking ibuprofen. Patient not taking: Reported on 05/27/2020 04/16/20 08/18/20  Molpus, 14/1/21, MD    Allergies    Almond (diagnostic)  Review of Systems   Review of Systems  All other systems reviewed and are negative. Ten systems reviewed and are negative for acute change, except as noted in the HPI.    Physical Exam Updated Vital Signs BP (!) 124/93    Pulse 82    Temp 98.6 F (37 C) (Oral)    Resp 18    Ht 5\' 4"  (1.626 m)    Wt 106.6 kg    LMP 04/04/2020    SpO2 99%    BMI 40.34 kg/m   Physical Exam Vitals and nursing note reviewed.  Constitutional:      General: She is not in acute distress.    Appearance: Normal appearance. She is normal weight. She is not ill-appearing, toxic-appearing or diaphoretic.  HENT:     Head: Normocephalic and atraumatic.     Right Ear: External ear normal.     Left Ear: External ear normal.     Nose: Nose normal.     Mouth/Throat:     Mouth: Mucous membranes are moist.     Pharynx: Oropharynx is clear. No oropharyngeal exudate or posterior oropharyngeal erythema.  Eyes:     General: No scleral icterus.       Right eye: No discharge.        Left eye: No discharge.     Extraocular Movements: Extraocular movements intact.     Conjunctiva/sclera: Conjunctivae normal.  Cardiovascular:     Rate and Rhythm: Normal rate and regular rhythm.     Pulses: Normal pulses.     Heart sounds: Normal heart sounds. No murmur heard. No friction rub. No gallop.   Pulmonary:     Effort: Pulmonary effort is normal. No respiratory distress.      Breath sounds: Normal breath sounds. No stridor. No wheezing, rhonchi or rales.  Abdominal:     General: Abdomen is flat.     Palpations: Abdomen is soft.     Tenderness: There is abdominal tenderness. There is left CVA tenderness. There is no right CVA tenderness.     Comments: Protuberant abdomen.  Soft.  Mild tenderness noted along the left central lateral abdomen.  Additional left flank pain noted.  No right flank pain noted.  Mild TTP also noted along the epigastric region.  Musculoskeletal:  General: Normal range of motion.     Cervical back: Normal range of motion and neck supple. No tenderness.     Right lower leg: No edema.     Left lower leg: No edema.  Skin:    General: Skin is warm and dry.  Neurological:     General: No focal deficit present.     Mental Status: She is alert and oriented to person, place, and time.  Psychiatric:        Mood and Affect: Mood normal.        Behavior: Behavior normal.    ED Results / Procedures / Treatments   Labs (all labs ordered are listed, but only abnormal results are displayed) Labs Reviewed  COMPREHENSIVE METABOLIC PANEL - Abnormal; Notable for the following components:      Result Value   Glucose, Bld 122 (*)    AST 53 (*)    ALT 60 (*)    All other components within normal limits  CBC WITH DIFFERENTIAL/PLATELET - Abnormal; Notable for the following components:   HCT 35.7 (*)    MCHC 36.7 (*)    All other components within normal limits  URINALYSIS, ROUTINE W REFLEX MICROSCOPIC   EKG None  Radiology CT Renal Stone Study  Result Date: 11/01/2020 CLINICAL DATA:  Left flank pain for approximately 1 week. History of a stone at the right ureteropelvic junction. EXAM: CT ABDOMEN AND PELVIS WITHOUT CONTRAST TECHNIQUE: Multidetector CT imaging of the abdomen and pelvis was performed following the standard protocol without IV contrast. COMPARISON:  CT abdomen and pelvis 10/24/2020. FINDINGS: Lower chest: Lung bases clear.   No pleural or pericardial effusion. Hepatobiliary: The liver measures 24 cm craniocaudal and demonstrates diffuse, marked hypoattenuation. No focal lesion. Status post cholecystectomy. Biliary tree negative. Pancreas: Unremarkable. No pancreatic ductal dilatation or surrounding inflammatory changes. Spleen: Normal in size without focal abnormality. Adrenals/Urinary Tract: The adrenal glands appear normal. Again seen is an approximately 0.4 cm stone at or just below the right ureteropelvic junction. There is minimal fullness of the right intrarenal collecting system. No other urinary tract stones on the right or left. Urinary bladder is decompressed. Stomach/Bowel: Stomach is within normal limits. Appendix appears normal. No evidence of bowel wall thickening, distention, or inflammatory changes. Scattered diverticula noted. Vascular/Lymphatic: No significant vascular findings are present. No enlarged abdominal or pelvic lymph nodes. Reproductive: Status post hysterectomy. No adnexal masses. Other: 2.7 x 1.9 cm hematoma anterior to the urinary bladder on the left is unchanged. Musculoskeletal: No acute or focal abnormality. IMPRESSION: No change or new abnormality since the prior examination. 0.4 cm right ureteral stone at or just below the ureteropelvic junction. There is minimal fullness of the right intrarenal collecting system. Small hematoma anterior to the urinary bladder. Hepatomegaly and fatty infiltration of the liver. Electronically Signed   By: Drusilla Kanner M.D.   On: 11/01/2020 14:59    Procedures Procedures   Medications Ordered in ED Medications  ketorolac (TORADOL) injection 60 mg (60 mg Intramuscular Given 11/01/20 1659)    ED Course  I have reviewed the triage vital signs and the nursing notes.  Pertinent labs & imaging results that were available during my care of the patient were reviewed by me and considered in my medical decision making (see chart for details).    MDM  Rules/Calculators/A&P                          Pt is a  40 y.o. female who presents the emergency department with multiple regions of abdominal pain.  Labs: CBC with a mildly decreased hematocrit of 35.7 as well as an MCHC elevated at 36.7.  Otherwise no abnormalities.  No leukocytosis. CMP with an elevated glucose at 122, AST of 53, ALT of 60.  History of hepatic steatosis. UA negative.  Imaging: CT renal stone study obtained showing no new changes or new abnormalities since the prior exam.  Patient still has a 0.4 cm right ureteral stone at or just below the ureteropelvic junction.  There is minimal fullness of the right intrarenal collecting system.  I, Placido SouLogan Slade [place]point, PA-C, personally reviewed and evaluated these images and lab results as part of my medical decision-making.  Patient having some mild left-sided abdominal pain as well as left flank pain.  I am concerned that her symptoms are likely musculoskeletal in nature given no abnormalities on CT or kidney stones on her left side.  She does still have a ureteral stone on the right side.  UA is negative.  Patient states she still has a significant amount of Flomax at home.  Urged her to continue taking this.  Patient given a dose of IM Toradol here in the emergency department for her acute pain.  We will send additional Percocet to her pharmacy.  She has follow-up with her PCP in 2 days.  Urged her to keep this appointment.  Patient given a referral to urology.  Discussed return precautions in length.  Her questions were answered and she was amicable at the time of discharge.  Note: Portions of this report may have been transcribed using voice recognition software. Every effort was made to ensure accuracy; however, inadvertent computerized transcription errors may be present.    Final Clinical Impression(s) / ED Diagnoses Final diagnoses:  Back pain, unspecified back location, unspecified back pain laterality, unspecified chronicity   Ureteral stone   Rx / DC Orders ED Discharge Orders         Ordered    oxyCODONE-acetaminophen (PERCOCET/ROXICET) 5-325 MG tablet  Every 8 hours PRN        11/01/20 1706           Placido SouJoldersma, Meagen Limones, PA-C 11/01/20 1717    Cheryll CockayneHong, Joshua S, MD 11/06/20 1459

## 2020-11-01 NOTE — ED Triage Notes (Addendum)
Pt c/o left flank pain-states she was dx with kidney stone ~1 week ago-rx percocet-states she has 2 left-last dose yesterday-states she has appt tomorrow with PCP to be referred to urology-was advised to return to ED-NAD-steady gait

## 2020-11-03 ENCOUNTER — Other Ambulatory Visit: Payer: Self-pay

## 2020-11-03 ENCOUNTER — Ambulatory Visit: Payer: Medicaid Other | Admitting: Physical Therapy

## 2020-11-03 ENCOUNTER — Ambulatory Visit (INDEPENDENT_AMBULATORY_CARE_PROVIDER_SITE_OTHER): Payer: Medicaid Other | Admitting: Family Medicine

## 2020-11-03 DIAGNOSIS — M5416 Radiculopathy, lumbar region: Secondary | ICD-10-CM | POA: Diagnosis present

## 2020-11-03 DIAGNOSIS — M545 Low back pain, unspecified: Secondary | ICD-10-CM

## 2020-11-03 DIAGNOSIS — M62830 Muscle spasm of back: Secondary | ICD-10-CM

## 2020-11-03 MED ORDER — KETOROLAC TROMETHAMINE 30 MG/ML IJ SOLN
30.0000 mg | Freq: Once | INTRAMUSCULAR | Status: AC
  Administered 2020-11-03: 30 mg via INTRAMUSCULAR

## 2020-11-03 NOTE — Therapy (Signed)
[facility] Outpatient Rehabilitation [facility] [address]  [address] [place], [place], [address] Phone: [phone]   Fax:  [phone]  Physical Therapy Treatment  Patient Details  Name: Katie Spencer MRN: [id number] Date of Birth: 02/19/1981 Referring Provider (PT): Dumonski   Encounter Date: 11/03/2020   PT End of Session - 11/03/20 1042    Visit Number 3    Number of Visits 12    Date for PT Re-Evaluation 12/05/20    Authorization Type [employer] medicaid    PT Start Time 1012    PT Stop Time 1100    PT Time Calculation (min) 48 min    Activity Tolerance Patient tolerated treatment well    Behavior During Therapy [facility] for tasks assessed/performed           Past Medical History:  Diagnosis Date  . Abnormal vaginal bleeding   . Chronic abdominal pain     Past Surgical History:  Procedure Laterality Date  . ABDOMINAL HYSTERECTOMY    . CESAREAN SECTION  2006, 2009, 2013, 2014  . CHOLECYSTECTOMY      There were no vitals filed for this visit.   Subjective Assessment - 11/03/20 1018    Subjective Patient had another visit to the ED, possible kidney stone.  She reports that she tried to clean her house yesterday, she reports that since cleaning the house the low back has really hurt more, right in the L/S area, denies any radicular signs    Currently in Pain? Yes    Pain Score 7     Pain Location Back    Pain Orientation Lower;Mid    Pain Descriptors / Indicators Aching;Sharp;Tightness    Aggravating Factors  bending, housework pain up to 9/10    Pain Relieving Factors heat, brace and pain medication at best pain a 3-4/10                             [facility] Adult PT Treatment/Exercise - 11/03/20 0001      Therapeutic Activites    Therapeutic Activities Other Therapeutic Activities    Other Therapeutic Activities educated on proper body mechanics for lifting, vacuuming, weight close, pivot, back straight      Modalities    Modalities Electrical Stimulation;Moist Heat      Moist Heat Therapy   Number Minutes Moist Heat 12 Minutes    Moist Heat Location Lumbar Spine      Electrical Stimulation   Electrical Stimulation Location L/S area    Electrical Stimulation Action IFC    Electrical Stimulation Parameters supine    Electrical Stimulation Goals Pain      Manual Therapy   Manual Therapy Manual Traction    Manual Traction manual sheet traction holding 30 seconds repeating and working on her relaxing and allowing the decompression                  PT Education - 11/03/20 1045    Education Details HEP for manual sheet traction    Person(s) Educated Patient    Methods Explanation;Demonstration;Handout    Comprehension Verbalized understanding            PT Short Term Goals - 10/27/20 1144      PT SHORT TERM GOAL #1   Title indepednent with initial HEP    Status Achieved             PT Long Term Goals - 11/03/20 1045  PT LONG TERM GOAL #1   Title understand posture and body mechanics    Status On-going      PT LONG TERM GOAL #2   Title incresae lumbar ROM 50%    Status On-going      PT LONG TERM GOAL #3   Title decrease pain overall 50%    Status On-going                 Plan - 11/03/20 1043    Clinical Impression Statement Patient had another ED visit for kidney stone on Monday, she reports that yesterday she did a lot of cleaning and "over did it", she reports that she is in a lot more pain today in the L/S area.  I did some education on body mechanics.  I tried manual sheet traction with good relief of pain, I educated her on how to do this at home and gave a hand out.    PT Next Visit Plan slowly start core activation and gentle mobility, needs stability and education on posture and body mechanics    Consulted and Agree with Plan of Care Patient           Patient will benefit from skilled therapeutic intervention in order to improve the following deficits  and impairments:  Decreased range of motion,Difficulty walking,Increased muscle spasms,Decreased activity tolerance,Pain,Improper body mechanics,Impaired flexibility,Postural dysfunction,Decreased strength  Visit Diagnosis: Acute bilateral low back pain without sciatica  Muscle spasm of back     Problem List Patient Active Problem List   Diagnosis Date Noted  . Lumbar radiculopathy 10/06/2020  . Wrist sprain, left, subsequent encounter 09/22/2020    Jearld Lesch., PT 11/03/2020, 10:46 AM  [facility] [address]  [address] [place], [place], [address] Phone: [phone]   Fax:  [phone]  Name: Katie Spencer MRN: [id number] Date of Birth: 05/14/1981

## 2020-11-03 NOTE — Assessment & Plan Note (Signed)
Acute exacerbation of her underlying pain. -Counseled on home exercise therapy and supportive care. -Can put a hold on physical therapy if needed. - IM toradol  - will try to get epidural performed.  - could consider SI joint injections.

## 2020-11-03 NOTE — Patient Instructions (Signed)
Good to see you You can put a hold on physical therapy if you need to  You can try a chiropractor  Please try to get the epidural  Please send me a message in MyChart with any questions or updates.  Please see me back in 4 weeks.   --Dr. Jordan Likes

## 2020-11-03 NOTE — Progress Notes (Signed)
  Katie Spencer - 40 y.o. female MRN [id number]  Date of birth: 01/29/1981  SUBJECTIVE:  Including CC & ROS.  No chief complaint on file.   Katie Spencer is a 40 y.o. female that is presenting with acute exacerbation of her low back pain.   Review of Systems See HPI   HISTORY: Past Medical, Surgical, Social, and Family History Reviewed & Updated per EMR.   Pertinent Historical Findings include:  Past Medical History:  Diagnosis Date  . Abnormal vaginal bleeding   . Chronic abdominal pain     Past Surgical History:  Procedure Laterality Date  . ABDOMINAL HYSTERECTOMY    . CESAREAN SECTION  2006, 2009, 2013, 2014  . CHOLECYSTECTOMY      No family history on file.  Social History   Socioeconomic History  . Marital status: Married    Spouse name: Not on file  . Number of children: Not on file  . Years of education: Not on file  . Highest education level: Not on file  Occupational History  . Not on file  Tobacco Use  . Smoking status: Current Every Day Smoker    Packs/day: 1.00    Types: Cigarettes  . Smokeless tobacco: Never Used  Vaping Use  . Vaping Use: Never used  Substance and Sexual Activity  . Alcohol use: Not Currently  . Drug use: Never  . Sexual activity: Not on file  Other Topics Concern  . Not on file  Social History Narrative  . Not on file   Social Determinants of Health   Financial Resource Strain: Not on file  Food Insecurity: Not on file  Transportation Needs: Not on file  Physical Activity: Not on file  Stress: Not on file  Social Connections: Not on file  Intimate Partner Violence: Not on file     PHYSICAL EXAM:  VS: BP 104/72 (BP Location: Left Arm, Patient Position: Sitting, Cuff Size: Large)   Ht 5\' 4"  (1.626 m)   Wt 235 lb (106.6 kg)   LMP 04/04/2020   BMI 40.34 kg/m  Physical Exam Gen: NAD, alert, cooperative with exam, well-appearing    ASSESSMENT & PLAN:   Lumbar radiculopathy Acute exacerbation of her  underlying pain. -Counseled on home exercise therapy and supportive care. -Can put a hold on physical therapy if needed. - IM toradol  - will try to get epidural performed.  - could consider SI joint injections.

## 2020-11-10 ENCOUNTER — Other Ambulatory Visit: Payer: Self-pay

## 2020-11-10 ENCOUNTER — Ambulatory Visit: Payer: Medicaid Other | Admitting: Physical Therapy

## 2020-11-10 ENCOUNTER — Emergency Department (HOSPITAL_COMMUNITY)
Admission: EM | Admit: 2020-11-10 | Discharge: 2020-11-10 | Disposition: A | Discharge status: Home / Self Care | Payer: Medicaid Other | Attending: Emergency Medicine | Admitting: Emergency Medicine

## 2020-11-10 ENCOUNTER — Emergency Department (HOSPITAL_COMMUNITY): Payer: Medicaid Other

## 2020-11-10 ENCOUNTER — Encounter (HOSPITAL_COMMUNITY): Payer: Self-pay

## 2020-11-10 DIAGNOSIS — F17210 Nicotine dependence, cigarettes, uncomplicated: Secondary | ICD-10-CM | POA: Insufficient documentation

## 2020-11-10 DIAGNOSIS — M545 Low back pain, unspecified: Secondary | ICD-10-CM

## 2020-11-10 DIAGNOSIS — M62830 Muscle spasm of back: Secondary | ICD-10-CM

## 2020-11-10 DIAGNOSIS — R1031 Right lower quadrant pain: Secondary | ICD-10-CM | POA: Diagnosis present

## 2020-11-10 DIAGNOSIS — N200 Calculus of kidney: Secondary | ICD-10-CM | POA: Diagnosis not present

## 2020-11-10 LAB — I-STAT CHEM 8, ED
BUN: 12 mg/dL (ref 6–20)
Calcium, Ion: 1.22 mmol/L (ref 1.15–1.40)
Chloride: 104 mmol/L (ref 98–111)
Creatinine, Ser: 0.6 mg/dL (ref 0.44–1.00)
Glucose, Bld: 165 mg/dL — ABNORMAL HIGH (ref 70–99)
HCT: 36 % (ref 36.0–46.0)
Hemoglobin: 12.2 g/dL (ref 12.0–15.0)
Potassium: 3.7 mmol/L (ref 3.5–5.1)
Sodium: 138 mmol/L (ref 135–145)
TCO2: 23 mmol/L (ref 22–32)

## 2020-11-10 LAB — URINALYSIS, ROUTINE W REFLEX MICROSCOPIC
Bilirubin Urine: NEGATIVE
Glucose, UA: NEGATIVE mg/dL
Hgb urine dipstick: NEGATIVE
Ketones, ur: NEGATIVE mg/dL
Leukocytes,Ua: NEGATIVE
Nitrite: NEGATIVE
Protein, ur: NEGATIVE mg/dL
Specific Gravity, Urine: 1.009 (ref 1.005–1.030)
pH: 6 (ref 5.0–8.0)

## 2020-11-10 MED ORDER — PANTOPRAZOLE SODIUM 40 MG IV SOLR
40.0000 mg | Freq: Once | INTRAVENOUS | Status: AC
  Administered 2020-11-10: 40 mg via INTRAVENOUS
  Filled 2020-11-10: qty 40

## 2020-11-10 MED ORDER — SODIUM CHLORIDE 0.9 % IV BOLUS
500.0000 mL | Freq: Once | INTRAVENOUS | Status: AC
  Administered 2020-11-10: 500 mL via INTRAVENOUS

## 2020-11-10 MED ORDER — MORPHINE SULFATE (PF) 4 MG/ML IV SOLN
6.0000 mg | Freq: Once | INTRAVENOUS | Status: AC
  Administered 2020-11-10: 6 mg via INTRAVENOUS
  Filled 2020-11-10: qty 2

## 2020-11-10 NOTE — Therapy (Signed)
[facility] [address]  [place], [place], [address] Phone: [phone]   Fax:  [phone]  Physical Therapy Treatment  Patient Details  Name: Katie Spencer MRN: [id number] Date of Birth: Jul 02, 1981 Referring Provider (PT): Dumonski   Encounter Date: 11/10/2020   PT End of Session - 11/10/20 0949    Visit Number 4    Number of Visits 12    Date for PT Re-Evaluation 12/05/20    Authorization Type [employer] medicaid    PT Start Time 0905    PT Stop Time 0949    PT Time Calculation (min) 44 min    Activity Tolerance Patient tolerated treatment well    Behavior During Therapy [facility] for tasks assessed/performed           Past Medical History:  Diagnosis Date  . Abnormal vaginal bleeding   . Chronic abdominal pain     Past Surgical History:  Procedure Laterality Date  . ABDOMINAL HYSTERECTOMY    . CESAREAN SECTION  2006, 2009, 2013, 2014  . CHOLECYSTECTOMY      There were no vitals filed for this visit.   Subjective Assessment - 11/10/20 0913    Subjective Patient reports that over the past few days she is feeling better, less pain.  Reports that she is not using the brace the last two days.  Feels like the exercises at home helps and that the "decompression" really helped    Currently in Pain? Yes    Pain Score 3     Pain Location Back    Pain Orientation Lower    Pain Relieving Factors the last treatment really helped              [facility] PT Assessment - 11/10/20 0001      AROM   Overall AROM Comments lumbar ROM decreased 50% with minimal c/o pain                         [facility] Adult PT Treatment/Exercise - 11/10/20 0001      Lumbar Exercises: Stretches   Passive Hamstring Stretch Right;Left;3 reps;20 seconds    Lower Trunk Rotation 3 reps;10 seconds    Piriformis Stretch Right;Left;3 reps;20 seconds      Lumbar Exercises: Aerobic   Nustep level 3 x 6 minutes      Lumbar  Exercises: Machines for Strengthening   Cybex Knee Extension 10# 2x10    Cybex Knee Flexion 25# 2x10    Other Lumbar Machine Exercise seated row 15# 2x10, lats 15# 2x10      Lumbar Exercises: Supine   Other Supine Lumbar Exercises feet on ball K2C, small trunk rotations, small bridges and isometric abs      Manual Therapy   Manual Therapy Manual Traction    Manual Traction manual sheet traction holding 30 seconds repeating and working on her relaxing and allowing the decompression                    PT Short Term Goals - 10/27/20 1144      PT SHORT TERM GOAL #1   Title indepednent with initial HEP    Status Achieved             PT Long Term Goals - 11/10/20 0954      PT LONG TERM GOAL #1   Title understand posture and body mechanics    Status On-going  PT LONG TERM GOAL #2   Title incresae lumbar ROM 50%    Status Achieved      PT LONG TERM GOAL #3   Title decrease pain overall 50%    Status Partially Met                 Plan - 11/10/20 0949    Clinical Impression Statement Patient reports that she is doing well, less pain, stopped using the back brace yesterday.  I initiated gym activities, needed a lot of instruction to go slow and cues for form, I also educated her on proper exercizes at the gym and how to avoid injury, she still is very tight in the left piriformis.  I continued sheet traction and she reports that she gets "amazing relief" with this.  It appears that the diastisis recti is smaller than when I did the evaluation, she does report doing the isometric abs at home    PT Next Visit Plan continue with working on core strength and stability, safety with body mechanics at home and if she transitions to the gym    Consulted and Agree with Plan of Care Patient           Patient will benefit from skilled therapeutic intervention in order to improve the following deficits and impairments:  Decreased range of motion,Difficulty  walking,Increased muscle spasms,Decreased activity tolerance,Pain,Improper body mechanics,Impaired flexibility,Postural dysfunction,Decreased strength  Visit Diagnosis: Acute bilateral low back pain without sciatica  Muscle spasm of back     Problem List Patient Active Problem List   Diagnosis Date Noted  . Lumbar radiculopathy 10/06/2020  . Wrist sprain, left, subsequent encounter 09/22/2020    Sumner Boast., PT 11/10/2020, 9:55 AM  [facility] [address]  [place], [place], [address] Phone: [phone]   Fax:  [phone]  Name: Katie Spencer MRN: [id number] Date of Birth: May 10, 1981

## 2020-11-10 NOTE — ED Provider Notes (Signed)
[facility]  HOSPITAL-EMERGENCY DEPT Provider Note   CSN: [id number] Arrival date & time: 11/10/20  2028     History Chief Complaint  Patient presents with  . Abdominal Pain    Katie Spencer is a 40 y.o. female.  Patient is a 40 year old female who presents with right flank pain.  She was diagnosed with a 4 mm right ureteral stone earlier this month.  She had some increased pain in the area today.  She has pain in her right back that radiates down to her right lower abdomen.  Its a similar pain to what she has had before with a stone.  She has had 2 CT scans which shows unchanged position of the proximal right ureteral stone at the UPJ.  She denies any fevers.  She has had some nausea but no vomiting.  She has a little bit of burning on urination but no other urinary symptoms.  She says she has good urinary flow.  She has been having some epigastric tenderness related to possible GERD.  She has been taking Protonix which has been helping but today her reflux and epigastric pain seem to be a little bit worse.        Past Medical History:  Diagnosis Date  . Abnormal vaginal bleeding   . Chronic abdominal pain     Patient Active Problem List   Diagnosis Date Noted  . Lumbar radiculopathy 10/06/2020  . Wrist sprain, left, subsequent encounter 09/22/2020    Past Surgical History:  Procedure Laterality Date  . ABDOMINAL HYSTERECTOMY    . CESAREAN SECTION  2006, 2009, 2013, 2014  . CHOLECYSTECTOMY       OB History   No obstetric history on file.     No family history on file.  Social History   Tobacco Use  . Smoking status: Current Every Day Smoker    Packs/day: 1.00    Types: Cigarettes  . Smokeless tobacco: Never Used  Vaping Use  . Vaping Use: Never used  Substance Use Topics  . Alcohol use: Not Currently  . Drug use: Never    Home Medications Prior to Admission medications   Medication Sig Start Date End Date Taking? Authorizing Provider   acetaminophen (TYLENOL) 500 MG tablet Take 1,000 mg by mouth every 6 (six) hours as needed for moderate pain.   Yes [provider]  butalbital-acetaminophen-caffeine (FIORICET) 50-325-40 MG tablet Take 1 tablet by mouth 2 (two) times daily as needed for headache.   Yes [provider]  citalopram (CELEXA) 40 MG tablet Take 40 mg by mouth daily. 01/01/20  Yes [provider]  cyclobenzaprine (FLEXERIL) 10 MG tablet Take 1 tablet (10 mg total) by mouth at bedtime. Patient taking differently: Take 10 mg by mouth daily as needed for muscle spasms. 09/20/20  Yes Placido Sou, PA-C  cyclobenzaprine (FLEXERIL) 5 MG tablet Take 5 mg by mouth 3 (three) times daily as needed for muscle spasms.   Yes [provider]  diazepam (VALIUM) 5 MG tablet Take 1 tablet (5 mg total) by mouth every 8 (eight) hours as needed for muscle spasms. 09/30/20  Yes Molpus, John, MD  hydrOXYzine (ATARAX/VISTARIL) 25 MG tablet Take 25 mg by mouth 3 (three) times daily as needed for anxiety.  04/30/20  Yes [provider]  methocarbamol (ROBAXIN) 500 MG tablet Take 500 mg by mouth daily as needed for muscle spasms. 10/05/20  Yes [provider]  MOBIC 15 MG tablet Take 15 mg by mouth daily  as needed for pain. 10/05/20  Yes [provider]  ondansetron (ZOFRAN ODT) 4 MG disintegrating tablet 4mg  ODT q4 hours prn nausea/vomit Patient taking differently: Take 4 mg by mouth every 4 (four) hours as needed for nausea or vomiting. 10/24/20  Yes 12/22/20, PA-C  pantoprazole (PROTONIX) 40 MG tablet Take 1 tablet (40 mg total) by mouth 2 (two) times daily. 10/24/20 11/23/20 Yes 01/23/21, PA-C  tamsulosin (FLOMAX) 0.4 MG CAPS capsule Take 1 capsule (0.4 mg total) by mouth daily. 10/24/20  Yes Ford, Kelsey N, PA-C  VRAYLAR 6 MG CAPS Take 6 mg by mouth at bedtime.  01/02/20  Yes [provider]  ibuprofen (ADVIL) 800 MG tablet Take 1 tablet (800 mg total) by mouth every 8  (eight) hours as needed. Patient taking differently: Take 800 mg by mouth every 8 (eight) hours as needed for mild pain. 09/20/20   11/18/20, PA-C  oxyCODONE-acetaminophen (PERCOCET/ROXICET) 5-325 MG tablet Take 1 tablet by mouth every 8 (eight) hours as needed for severe pain. Patient not taking: No sig reported 11/01/20   11/03/20, PA-C  belladonna-opium (B&O SUPPRETTES) 16.2-30 MG suppository Place 1 suppository rectally every 8 (eight) hours as needed for pain. Patient not taking: Reported on 05/27/2020 05/20/20 08/18/20  14/1/21, MD  lansoprazole (PREVACID) 30 MG capsule Take 1 capsule daily while taking ibuprofen. Patient not taking: Reported on 05/27/2020 04/16/20 08/18/20  Molpus, 14/1/21, MD    Allergies    Almond (diagnostic)  Review of Systems   Review of Systems  Constitutional: Negative for chills, diaphoresis, fatigue and fever.  HENT: Negative for congestion, rhinorrhea and sneezing.   Eyes: Negative.   Respiratory: Negative for cough, chest tightness and shortness of breath.   Cardiovascular: Negative for chest pain and leg swelling.  Gastrointestinal: Positive for abdominal pain and nausea. Negative for blood in stool, diarrhea and vomiting.  Genitourinary: Negative for difficulty urinating, flank pain, frequency and hematuria.       Mild burning on urination  Musculoskeletal: Negative for arthralgias and back pain.  Skin: Negative for rash.  Neurological: Negative for dizziness, speech difficulty, weakness, numbness and headaches.    Physical Exam Updated Vital Signs BP (!) 138/102   Pulse 76   Temp 98 F (36.7 C) (Oral)   Resp 18   LMP 04/04/2020   SpO2 100%   Physical Exam Constitutional:      Appearance: She is well-developed and well-nourished.  HENT:     Head: Normocephalic and atraumatic.  Eyes:     Pupils: Pupils are equal, round, and reactive to light.  Cardiovascular:     Rate and Rhythm: Normal rate and regular rhythm.      Heart sounds: Normal heart sounds.  Pulmonary:     Effort: Pulmonary effort is normal. No respiratory distress.     Breath sounds: Normal breath sounds. No wheezing or rales.  Chest:     Chest wall: No tenderness.  Abdominal:     General: Bowel sounds are normal.     Palpations: Abdomen is soft.     Tenderness: There is abdominal tenderness (Pain to the right mid and lower abdomen). There is no guarding or rebound.  Musculoskeletal:        General: No edema. Normal range of motion.     Cervical back: Normal range of motion and neck supple.  Lymphadenopathy:     Cervical: No cervical adenopathy.  Skin:    General: Skin is warm and dry.  Findings: No rash.  Neurological:     Mental Status: She is alert and oriented to person, place, and time.  Psychiatric:        Mood and Affect: Mood and affect normal.     ED Results / Procedures / Treatments   Labs (all labs ordered are listed, but only abnormal results are displayed) Labs Reviewed  I-STAT CHEM 8, ED - Abnormal; Notable for the following components:      Result Value   Glucose, Bld 165 (*)    All other components within normal limits  URINALYSIS, ROUTINE W REFLEX MICROSCOPIC    EKG None  Radiology DG Abdomen 1 View  Result Date: 11/10/2020 CLINICAL DATA:  Flank pain right-sided EXAM: ABDOMEN - 1 VIEW COMPARISON:  CT 11/01/2020 FINDINGS: Nonobstructed gas pattern. CT demonstrated right UPJ stone is not well visualized radiographically. There are surgical clips in the right upper quadrant. IMPRESSION: Nonobstructed gas pattern. No definitive radiopaque calculi identified. Electronically Signed   By: Jasmine Pang M.D.   On: 11/10/2020 21:43    Procedures Procedures   Medications Ordered in ED Medications  sodium chloride 0.9 % bolus 500 mL (0 mLs Intravenous Stopped 11/10/20 2228)  morphine 4 MG/ML injection 6 mg (6 mg Intravenous Given 11/10/20 2107)  pantoprazole (PROTONIX) injection 40 mg (40 mg Intravenous Given  11/10/20 2106)  morphine 4 MG/ML injection 6 mg (6 mg Intravenous Given 11/10/20 2258)    ED Course  I have reviewed the triage vital signs and the nursing notes.  Pertinent labs & imaging results that were available during my care of the patient were reviewed by me and considered in my medical decision making (see chart for details).    MDM Rules/Calculators/A&P                          Pt presents with right flank pain.  She has a known 4 mm kidney stone in the right ureter.  She has had 2 recent CT scans.  I did not feel that we should repeat a third 1 today.  She had a KUB which did not really show a stone.  Her urine does not show any signs of infection or hematuria.  Her creatinine is normal.  Her pain is well controlled in the ED.  She is working on getting referred to a urologist.  Apparently it has to come through her primary care provider given that she has Medicaid.  They have already sent the referral in but she has not heard about appointment.  She is in a call tomorrow about it.  Return precautions were given. Final Clinical Impression(s) / ED Diagnoses Final diagnoses:  Kidney stone    Rx / DC Orders ED Discharge Orders    None       Rolan Bucco, MD 11/10/20 2304

## 2020-11-10 NOTE — ED Triage Notes (Signed)
Pt complains of right lower abd pain that radiates to her back with a known kidney stone

## 2020-11-12 ENCOUNTER — Other Ambulatory Visit: Payer: Self-pay

## 2020-11-12 ENCOUNTER — Ambulatory Visit: Payer: Medicaid Other

## 2020-11-12 DIAGNOSIS — M545 Low back pain, unspecified: Secondary | ICD-10-CM | POA: Diagnosis not present

## 2020-11-12 DIAGNOSIS — M62830 Muscle spasm of back: Secondary | ICD-10-CM

## 2020-11-12 NOTE — Therapy (Signed)
[facility] [address]  [place], [place], [address] Phone: [phone]   Fax:  [phone]  Physical Therapy Treatment  Patient Details  Name: Katie Spencer MRN: [id number] Date of Birth: Mar 21, 1981 Referring Provider (PT): Dumonski   Encounter Date: 11/12/2020   PT End of Session - 11/12/20 1047    Visit Number 5    Number of Visits 12    Date for PT Re-Evaluation 12/05/20    Authorization Type [employer] med[employer]    PT Start Time 0931    PT Stop Time 1016    PT Time Calculation (min) 45 min    Activity Tolerance Patient tolerated treatment well;Patient limited by pain    Behavior During Therapy [facility] for tasks assessed/performed           Past Medical History:  Diagnosis Date  . Abnormal vaginal bleeding   . Chronic abdominal pain     Past Surgical History:  Procedure Laterality Date  . ABDOMINAL HYSTERECTOMY    . CESAREAN SECTION  2006, 2009, 2013, 2014  . CHOLECYSTECTOMY      There were no vitals filed for this visit.   Subjective Assessment - 11/12/20 0937    Subjective Pt states that she was still having some pain today in her low back.    Patient Stated Goals have less pain, feel stronger, maybe go to a gym to help be stronger but be safe    Currently in Pain? Yes    Pain Score 5     Pain Location Back    Pain Orientation Lower    Pain Descriptors / Indicators Nagging    Pain Type Acute pain                             [facility] Adult PT Treatment/Exercise - 11/12/20 0001      Exercises   Exercises Lumbar      Lumbar Exercises: Stretches   Passive Hamstring Stretch Right;Left;2 reps;30 seconds    Piriformis Stretch Right;Left;2 reps;30 seconds    Other Lumbar Stretch Exercise childs pose 2x30 sec      Lumbar Exercises: Supine   Bent Knee Raise 10 reps    Bent Knee Raise Limitations R tband    Dead Bug 10 reps    Dead Bug Limitations LE only    Other Supine  Lumbar Exercises feet on ball, LTR, bridge 10 reps each    Other Supine Lumbar Exercises pedals with TrA activation, 20 reps      Lumbar Exercises: Quadruped   Other Quadruped Lumbar Exercises bird dog LE only,10 reps; red tband 5 reps      Manual Therapy   Manual Therapy Joint mobilization    Joint Mobilization Inferior hip glide with oscillations                    PT Short Term Goals - 10/27/20 1144      PT SHORT TERM GOAL #1   Title indepednent with initial HEP    Status Achieved             PT Long Term Goals - 11/10/20 0954      PT LONG TERM GOAL #1   Title understand posture and body mechanics    Status On-going      PT LONG TERM GOAL #2   Title incresae lumbar ROM 50%    Status Achieved  PT LONG TERM GOAL #3   Title decrease pain overall 50%    Status Partially Met                 Plan - 11/12/20 1047    Clinical Impression Statement Pt noted that she did not wear her brace today. She was in pain at the beginning of treatment as described in the middle of her back. Pt reported that the inferior hip glides really helped and educatd her on how to do so at home. Manual distraction decreased pain and allowed pt to be able to complete exercises. She needed constant cueing to keep TrA activation during core exercises and became fatigued with bird dog and deadbug exercises. Pt responded well to treatment.    PT Frequency 2x / week    PT Duration 6 weeks    PT Treatment/Interventions ADLs/Self Care Home Management;Cryotherapy;Electrical Stimulation;Moist Heat;Traction;Gait training;Stair training;Functional mobility training;Therapeutic activities;Therapeutic exercise;Neuromuscular re-education;Manual techniques;Patient/family education;Dry needling    PT Next Visit Plan continue with working on core strength and stability, safety with body mechanics at home and if she transitions to the gym    Consulted and Agree with Plan of Care Patient            Patient will benefit from skilled therapeutic intervention in order to improve the following deficits and impairments:  Decreased range of motion,Difficulty walking,Increased muscle spasms,Decreased activity tolerance,Pain,Improper body mechanics,Impaired flexibility,Postural dysfunction,Decreased strength  Visit Diagnosis: Acute bilateral low back pain without sciatica  Muscle spasm of back     Problem List Patient Active Problem List   Diagnosis Date Noted  . Lumbar radiculopathy 10/06/2020  . Wrist sprain, left, subsequent encounter 09/22/2020    Artist Pais, PTA 11/12/2020, 10:55 AM  [facility] [address]  [place], [place], [address] Phone: [phone]   Fax:  [phone]  Name: Katie Spencer MRN: [id number] Date of Birth: Apr 11, 1981

## 2020-11-15 ENCOUNTER — Encounter (HOSPITAL_COMMUNITY): Payer: Self-pay

## 2020-11-15 ENCOUNTER — Other Ambulatory Visit: Payer: Self-pay

## 2020-11-15 ENCOUNTER — Emergency Department (HOSPITAL_COMMUNITY): Payer: Medicaid Other

## 2020-11-15 ENCOUNTER — Ambulatory Visit: Payer: Medicaid Other | Admitting: Physical Therapy

## 2020-11-15 ENCOUNTER — Emergency Department (HOSPITAL_COMMUNITY)
Admission: EM | Admit: 2020-11-15 | Discharge: 2020-11-15 | Disposition: A | Discharge status: Home / Self Care | Payer: Medicaid Other | Attending: Emergency Medicine | Admitting: Emergency Medicine

## 2020-11-15 ENCOUNTER — Encounter: Payer: Self-pay | Admitting: Physical Therapy

## 2020-11-15 DIAGNOSIS — M545 Low back pain, unspecified: Secondary | ICD-10-CM | POA: Diagnosis not present

## 2020-11-15 DIAGNOSIS — F17210 Nicotine dependence, cigarettes, uncomplicated: Secondary | ICD-10-CM | POA: Insufficient documentation

## 2020-11-15 DIAGNOSIS — R1031 Right lower quadrant pain: Secondary | ICD-10-CM | POA: Diagnosis present

## 2020-11-15 DIAGNOSIS — R109 Unspecified abdominal pain: Secondary | ICD-10-CM

## 2020-11-15 DIAGNOSIS — N200 Calculus of kidney: Secondary | ICD-10-CM | POA: Diagnosis not present

## 2020-11-15 DIAGNOSIS — M62830 Muscle spasm of back: Secondary | ICD-10-CM

## 2020-11-15 HISTORY — DX: Disorder of kidney and ureter, unspecified: N28.9

## 2020-11-15 LAB — CBC WITH DIFFERENTIAL/PLATELET
Abs Immature Granulocytes: 0.07 10*3/uL (ref 0.00–0.07)
Basophils Absolute: 0.1 10*3/uL (ref 0.0–0.1)
Basophils Relative: 1 %
Eosinophils Absolute: 0.2 10*3/uL (ref 0.0–0.5)
Eosinophils Relative: 2 %
HCT: 37.5 % (ref 36.0–46.0)
Hemoglobin: 13.2 g/dL (ref 12.0–15.0)
Immature Granulocytes: 1 %
Lymphocytes Relative: 31 %
Lymphs Abs: 3.4 10*3/uL (ref 0.7–4.0)
MCH: 32.6 pg (ref 26.0–34.0)
MCHC: 35.2 g/dL (ref 30.0–36.0)
MCV: 92.6 fL (ref 80.0–100.0)
Monocytes Absolute: 0.6 10*3/uL (ref 0.1–1.0)
Monocytes Relative: 5 %
Neutro Abs: 6.6 10*3/uL (ref 1.7–7.7)
Neutrophils Relative %: 60 %
Platelets: 226 10*3/uL (ref 150–400)
RBC: 4.05 MIL/uL (ref 3.87–5.11)
RDW: 11.8 % (ref 11.5–15.5)
WBC: 10.9 10*3/uL — ABNORMAL HIGH (ref 4.0–10.5)
nRBC: 0 % (ref 0.0–0.2)

## 2020-11-15 LAB — LIPASE, BLOOD: Lipase: 30 U/L (ref 11–51)

## 2020-11-15 LAB — COMPREHENSIVE METABOLIC PANEL
ALT: 53 U/L — ABNORMAL HIGH (ref 0–44)
AST: 47 U/L — ABNORMAL HIGH (ref 15–41)
Albumin: 4.3 g/dL (ref 3.5–5.0)
Alkaline Phosphatase: 75 U/L (ref 38–126)
Anion gap: 11 (ref 5–15)
BUN: 13 mg/dL (ref 6–20)
CO2: 22 mmol/L (ref 22–32)
Calcium: 9.4 mg/dL (ref 8.9–10.3)
Chloride: 103 mmol/L (ref 98–111)
Creatinine, Ser: 0.68 mg/dL (ref 0.44–1.00)
GFR, Estimated: >60 mL/min (ref >60)
Glucose, Bld: 170 mg/dL — ABNORMAL HIGH (ref 70–99)
Potassium: 3.3 mmol/L — ABNORMAL LOW (ref 3.5–5.1)
Sodium: 136 mmol/L (ref 135–145)
Total Bilirubin: 0.6 mg/dL (ref 0.3–1.2)
Total Protein: 7.3 g/dL (ref 6.5–8.1)

## 2020-11-15 LAB — URINALYSIS, ROUTINE W REFLEX MICROSCOPIC
Bilirubin Urine: NEGATIVE
Glucose, UA: NEGATIVE mg/dL
Hgb urine dipstick: NEGATIVE
Ketones, ur: NEGATIVE mg/dL
Leukocytes,Ua: NEGATIVE
Nitrite: NEGATIVE
Protein, ur: NEGATIVE mg/dL
Specific Gravity, Urine: 1.02 (ref 1.005–1.030)
pH: 7 (ref 5.0–8.0)

## 2020-11-15 LAB — I-STAT BETA HCG BLOOD, ED (MC, WL, AP ONLY): I-stat hCG, quantitative: <5 m[IU]/mL (ref <5)

## 2020-11-15 MED ORDER — ONDANSETRON HCL 4 MG/2ML IJ SOLN
4.0000 mg | Freq: Once | INTRAMUSCULAR | Status: AC
  Administered 2020-11-15: 4 mg via INTRAVENOUS
  Filled 2020-11-15: qty 2

## 2020-11-15 MED ORDER — MORPHINE SULFATE (PF) 4 MG/ML IV SOLN
4.0000 mg | Freq: Once | INTRAVENOUS | Status: AC
  Administered 2020-11-15: 4 mg via INTRAVENOUS
  Filled 2020-11-15: qty 1

## 2020-11-15 MED ORDER — DIPHENHYDRAMINE HCL 50 MG/ML IJ SOLN
25.0000 mg | Freq: Once | INTRAMUSCULAR | Status: AC
  Administered 2020-11-15: 25 mg via INTRAVENOUS

## 2020-11-15 MED ORDER — KETOROLAC TROMETHAMINE 30 MG/ML IJ SOLN
30.0000 mg | Freq: Once | INTRAMUSCULAR | Status: AC
  Administered 2020-11-15: 30 mg via INTRAVENOUS
  Filled 2020-11-15: qty 1

## 2020-11-15 MED ORDER — DIPHENHYDRAMINE HCL 50 MG/ML IJ SOLN
INTRAMUSCULAR | Status: AC
  Filled 2020-11-15: qty 1

## 2020-11-15 MED ORDER — ETODOLAC 300 MG PO CAPS: 300.0000 mg | ORAL_CAPSULE | Freq: Three times a day (TID) | ORAL | 0 refills | Status: DC

## 2020-11-15 MED ORDER — CYCLOBENZAPRINE HCL 10 MG PO TABS: 10.0000 mg | ORAL_TABLET | Freq: Three times a day (TID) | ORAL | 0 refills | Status: DC | PRN

## 2020-11-15 NOTE — ED Provider Notes (Signed)
[facility]-EMERGENCY DEPT Provider Note   CSN: [id number] Arrival date & time: 11/15/20  1414     History Chief Complaint  Patient presents with  . Flank Pain    Katie HerrlichKristine Spencer is a 40 y.o. female.  HPI   Patient presents to the ED for evaluation of right lower abdominal/flank pain.  Patient states she has a history of previous kidney stones.  She had 2 prior CT scans that demonstrated a distal right ureteral stone.  Patient thinks she had passed it the last time she was seen in the ED.  She has been doing well until the last few days where she had a sudden pop and felt like she was experiencing the same pain that she had before.  Patient states is a more constant pain today.  She denies any fevers or chills.  She did have an episode of vomiting.  No dysuria.  Patient has a referral to urologist and is scheduled to see them within the week.  Past Medical History:  Diagnosis Date  . Abnormal vaginal bleeding   . Chronic abdominal pain   . Renal disorder     Patient Active Problem List   Diagnosis Date Noted  . Lumbar radiculopathy 10/06/2020  . Wrist sprain, left, subsequent encounter 09/22/2020    Past Surgical History:  Procedure Laterality Date  . ABDOMINAL HYSTERECTOMY    . CESAREAN SECTION  2006, 2009, 2013, 2014  . CHOLECYSTECTOMY       OB History   No obstetric history on file.     History reviewed. No pertinent family history.  Social History   Tobacco Use  . Smoking status: Current Every Day Smoker    Packs/day: 1.00    Types: Cigarettes  . Smokeless tobacco: Never Used  Vaping Use  . Vaping Use: Never used  Substance Use Topics  . Alcohol use: Not Currently  . Drug use: Never    Home Medications Prior to Admission medications   Medication Sig Start Date End Date Taking? Authorizing Provider  cyclobenzaprine (FLEXERIL) 10 MG tablet Take 1 tablet (10 mg total) by mouth 3 (three) times daily as needed for muscle spasms.  11/15/20  Yes Linwood DibblesKnapp, Ngai Parcell, MD  etodolac (LODINE) 300 MG capsule Take 1 capsule (300 mg total) by mouth every 8 (eight) hours. 11/15/20  Yes Linwood DibblesKnapp, Kaedence Connelly, MD  acetaminophen (TYLENOL) 500 MG tablet Take 1,000 mg by mouth every 6 (six) hours as needed for moderate pain.    [provider]  butalbital-acetaminophen-caffeine (FIORICET) 50-325-40 MG tablet Take 1 tablet by mouth 2 (two) times daily as needed for headache.    [provider]  citalopram (CELEXA) 40 MG tablet Take 40 mg by mouth daily. 01/01/20   [provider]  diazepam (VALIUM) 5 MG tablet Take 1 tablet (5 mg total) by mouth every 8 (eight) hours as needed for muscle spasms. 09/30/20   Molpus, John, MD  hydrOXYzine (ATARAX/VISTARIL) 25 MG tablet Take 25 mg by mouth 3 (three) times daily as needed for anxiety.  04/30/20   [provider]  ibuprofen (ADVIL) 800 MG tablet Take 1 tablet (800 mg total) by mouth every 8 (eight) hours as needed. Patient taking differently: Take 800 mg by mouth every 8 (eight) hours as needed for mild pain. 09/20/20   Placido SouJoldersma, Logan, PA-C  methocarbamol (ROBAXIN) 500 MG tablet Take 500 mg by mouth daily as needed for muscle spasms. 10/05/20   [provider]  MOBIC 15 MG tablet Take  15 mg by mouth daily as needed for pain. 10/05/20   [provider]  ondansetron (ZOFRAN ODT) 4 MG disintegrating tablet 4mg  ODT q4 hours prn nausea/vomit Patient taking differently: Take 4 mg by mouth every 4 (four) hours as needed for nausea or vomiting. 10/24/20   12/22/20, PA-C  oxyCODONE-acetaminophen (PERCOCET/ROXICET) 5-325 MG tablet Take 1 tablet by mouth every 8 (eight) hours as needed for severe pain. Patient not taking: No sig reported 11/01/20   11/03/20, PA-C  pantoprazole (PROTONIX) 40 MG tablet Take 1 tablet (40 mg total) by mouth 2 (two) times daily. 10/24/20 11/23/20  01/23/21, PA-C  tamsulosin (FLOMAX) 0.4 MG CAPS capsule Take 1 capsule (0.4 mg total) by mouth  daily. 10/24/20   12/22/20, PA-C  VRAYLAR 6 MG CAPS Take 6 mg by mouth at bedtime.  01/02/20   [provider]  belladonna-opium (B&O SUPPRETTES) 16.2-30 MG suppository Place 1 suppository rectally every 8 (eight) hours as needed for pain. Patient not taking: Reported on 05/27/2020 05/20/20 08/18/20  14/1/21, MD  lansoprazole (PREVACID) 30 MG capsule Take 1 capsule daily while taking ibuprofen. Patient not taking: Reported on 05/27/2020 04/16/20 08/18/20  Molpus, 14/1/21, MD    Allergies    Almond (diagnostic)  Review of Systems   Review of Systems  All other systems reviewed and are negative.   Physical Exam Updated Vital Signs BP 125/80 (BP Location: Right Arm)   Pulse 70   Temp 99 F (37.2 C) (Oral)   Resp 17   Ht 1.626 m (5\' 4" )   Wt 104.3 kg   LMP 04/04/2020   SpO2 99%   BMI 39.48 kg/m   Physical Exam Vitals and nursing note reviewed.  Constitutional:      General: She is not in acute distress.    Appearance: She is well-developed and well-nourished.  HENT:     Head: Normocephalic and atraumatic.     Right Ear: External ear normal.     Left Ear: External ear normal.  Eyes:     General: No scleral icterus.       Right eye: No discharge.        Left eye: No discharge.     Conjunctiva/sclera: Conjunctivae normal.  Neck:     Trachea: No tracheal deviation.  Cardiovascular:     Rate and Rhythm: Normal rate and regular rhythm.     Pulses: Intact distal pulses.  Pulmonary:     Effort: Pulmonary effort is normal. No respiratory distress.     Breath sounds: Normal breath sounds. No stridor. No wheezing or rales.  Abdominal:     General: Bowel sounds are normal. There is no distension.     Palpations: Abdomen is soft.     Tenderness: There is abdominal tenderness in the right lower quadrant. There is no guarding or rebound.  Musculoskeletal:        General: No tenderness or edema.     Cervical back: Neck supple.  Skin:    General: Skin is warm and  dry.     Findings: No rash.  Neurological:     Mental Status: She is alert.     Cranial Nerves: No cranial nerve deficit (no facial droop, extraocular movements intact, no slurred speech).     Sensory: No sensory deficit.     Motor: No abnormal muscle tone or seizure activity.     Coordination: Coordination normal.     Deep Tendon Reflexes: Strength normal.  Psychiatric:        Mood and Affect: Mood and affect normal.     ED Results / Procedures / Treatments   Labs (all labs ordered are listed, but only abnormal results are displayed) Labs Reviewed  URINALYSIS, ROUTINE W REFLEX MICROSCOPIC - Abnormal; Notable for the following components:      Result Value   APPearance CLOUDY (*)    All other components within normal limits  COMPREHENSIVE METABOLIC PANEL - Abnormal; Notable for the following components:   Potassium 3.3 (*)    Glucose, Bld 170 (*)    AST 47 (*)    ALT 53 (*)    All other components within normal limits  CBC WITH DIFFERENTIAL/PLATELET - Abnormal; Notable for the following components:   WBC 10.9 (*)    All other components within normal limits  LIPASE, BLOOD  I-STAT BETA HCG BLOOD, ED ([facility], [facility], [facility] ONLY)    EKG None  Radiology CT RENAL STONE STUDY  Result Date: 11/15/2020 CLINICAL DATA:  Flank pain. Kidney stones suspected. RIGHT-sided flank pain for the last few days. Pain is worse today. History of stones. EXAM: CT ABDOMEN AND PELVIS WITHOUT CONTRAST TECHNIQUE: Multidetector CT imaging of the abdomen and pelvis was performed following the standard protocol without IV contrast. COMPARISON:  11/01/2020 FINDINGS: Lower chest: Lung bases are unremarkable.  Heart size is normal. Hepatobiliary: There is diffuse low-attenuation of the liver which is consistent with hepatic steatosis. No focal liver lesions are identified. Prior cholecystectomy. Pancreas: Unremarkable. No pancreatic ductal dilatation or surrounding inflammatory changes. Spleen: Normal in size without  focal abnormality. Adrenals/Urinary Tract: Adrenal glands are normal. A 3 millimeter intrarenal calculus is identified in the LOWER pole the RIGHT kidney. The stone previously seen in their ureteropelvic junction is no longer present. The RIGHT ureter is unremarkable. LEFT kidney and ureter are normal. The bladder and visualized portion of the urethra are normal. Stomach/Bowel: The stomach and small bowel loops are normal in appearance. The appendix is well seen and has a normal appearance. There are scattered colonic diverticula. There is no acute diverticulitis. Average stool burden. Vascular/Lymphatic: No significant vascular findings are present. No enlarged abdominal or pelvic lymph nodes. Reproductive: Hysterectomy.  No adnexal mass. Other: No ascites. Small residual hematoma anterior to the urinary bladder is 2.7 x 2.2 centimeters without recent change. Musculoskeletal: No acute or significant osseous findings. IMPRESSION: 1. No evidence for acute abnormality of the abdomen or pelvis. 2. Hepatic steatosis. 3. Prior cholecystectomy and hysterectomy. 4. Nonobstructing RIGHT intrarenal calculus. 5. Normal appendix. 6. Colonic diverticulosis. 7. Small residual hematoma anterior to the urinary bladder without recent change. Electronically Signed   By: Norva Pavlov M.D.   On: 11/15/2020 18:53    Procedures Procedures   Medications Ordered in ED Medications  diphenhydrAMINE (BENADRYL) 50 MG/ML injection (has no administration in time range)  morphine 4 MG/ML injection 4 mg (4 mg Intravenous Given 11/15/20 1641)  ondansetron (ZOFRAN) injection 4 mg (4 mg Intravenous Given 11/15/20 1639)  diphenhydrAMINE (BENADRYL) injection 25 mg (25 mg Intravenous Given 11/15/20 1653)  ketorolac (TORADOL) 30 MG/ML injection 30 mg (30 mg Intravenous Given 11/15/20 1844)    ED Course  I have reviewed the triage vital signs and the nursing notes.  Pertinent labs & imaging results that were available during my care of  the patient were reviewed by me and considered in my medical decision making (see chart for details).  Clinical Course as of 11/15/20 1933  Mon Nov 15, 2020  1650 Patient complaining of some itching now after the morphine.  We will give a dose of Benadryl [JK]  1734 CBC normal.  Metabolic panel pending. [JK]  1734 Still having pain.  Dose of Toradol ordered. [JK]  1907 CT scan IMPRESSION: 1. No evidence for acute abnormality of the abdomen or pelvis. 2. Hepatic steatosis. 3. Prior cholecystectomy and hysterectomy. 4. Nonobstructing RIGHT intrarenal calculus. 5. Normal appendix. 6. Colonic diverticulosis. 7. Small residual hematoma anterior to the urinary bladder without recent change.    [JK]    Clinical Course User Index [JK] Linwood Dibbles, MD   MDM Rules/Calculators/A&P                          Patient presented to the ED for evaluation of flank pain.  Patient has had recent kidney stones and thought she might have another one.  She is not had any issues with vomiting or diarrhea.  No urinary symptoms.  Patient's laboratory tests are reassuring.  CT scan did not show any acute abnormality.  Patient has had her gallbladder and uterus removed.  She has a normal-appearing appendix.  She has an intrarenal stone but that would not be causing her symptoms.  Patient does have a small residual bladder hematoma.  Patient states her dog did jump on her previously.  It is possible that could be causing some of this persistent pain.  At this point no signs of any emergency medical condition.  Will discharge home with pain medications and muscle relaxant. Final Clinical Impression(s) / ED Diagnoses Final diagnoses:  Flank pain    Rx / DC Orders ED Discharge Orders         Ordered    etodolac (LODINE) 300 MG capsule  Every 8 hours       Note to Pharmacy: As needed for pain   11/15/20 1925    cyclobenzaprine (FLEXERIL) 10 MG tablet  3 times daily PRN        11/15/20 1925            Linwood Dibbles, MD 11/15/20 1933

## 2020-11-15 NOTE — ED Triage Notes (Signed)
Patient c/o right flank that radiates into the right groin x 5 days. Patient reports a history of kidney stones. Patient states that she went to her doctor today, but he did not do anything about her pain.

## 2020-11-15 NOTE — ED Notes (Signed)
Immediately after administering IV morphine, pts right arm began to itch and turn red. At this time pt denies shob, difficulty breathing, and known allergies to morphine. Pt does not appear to be in any distress. MD Lynelle Doctor made aware.

## 2020-11-15 NOTE — Therapy (Signed)
[facility] [address]  [place], [place], [address] Phone: [phone]   Fax:  [phone]  Physical Therapy Treatment  Patient Details  Name: Katie Spencer MRN: [id number] Date of Birth: 05-09-1981 Referring Provider (PT): Dumonski   Encounter Date: 11/15/2020   PT End of Session - 11/15/20 1019    Visit Number 6    Number of Visits 12    Date for PT Re-Evaluation 12/05/20    Authorization Type [employer] medicaid    PT Start Time 0931    PT Stop Time 1009    PT Time Calculation (min) 38 min    Activity Tolerance Patient tolerated treatment well    Behavior During Therapy [facility] for tasks assessed/performed           Past Medical History:  Diagnosis Date  . Abnormal vaginal bleeding   . Chronic abdominal pain     Past Surgical History:  Procedure Laterality Date  . ABDOMINAL HYSTERECTOMY    . CESAREAN SECTION  2006, 2009, 2013, 2014  . CHOLECYSTECTOMY      There were no vitals filed for this visit.   Subjective Assessment - 11/15/20 0931    Subjective Doing alright. Notes that she is still having groin pain as she either has a new kidney stone or has not passed the one she had. does report that her back pain and ksiney stones did start around the same time. Notes that she has started drinking more water and avoiding drinking Monster energy drinks.    Patient Stated Goals have less pain, feel stronger, maybe go to a gym to help be stronger but be safe    Currently in Pain? Yes    Pain Score 4     Pain Location Groin    Pain Orientation Right    Pain Descriptors / Indicators Aching;Nagging    Pain Type Acute pain                             [facility] Adult PT Treatment/Exercise - 11/15/20 0001      Lumbar Exercises: Stretches   Hip Flexor Stretch Right;2 reps;30 seconds    Hip Flexor Stretch Limitations mod thomas with strap    Piriformis Stretch Right;Left;2 reps;30 seconds     Piriformis Stretch Limitations supine KTOS    Figure 4 Stretch 2 reps;30 seconds;With overpressure    Figure 4 Stretch Limitations supine      Lumbar Exercises: Aerobic   Nustep level 4 x 6 minutes (UEs/LEs)      Lumbar Exercises: Standing   Wall Slides 15 reps    Wall Slides Limitations red TB above knees   cues for positioning and decreasing depth d/t knee pain   Other Standing Lumbar Exercises sidestepping with yellow loop around ankles 4x 65f   slight lateral trunk lean     Lumbar Exercises: Supine   Pelvic Tilt 20 reps    Pelvic Tilt Limitations pos pelvic tilt + overhead yellow medball lift    Bridge with Ball Squeeze 10 reps   very limited ROM   Bridge with clamshell 10 reps   red TB   Other Supine Lumbar Exercises core contraction + alt bend knee fallout 10x each with red TB                    PT Short Term Goals - 10/27/20 1144      PT  SHORT TERM GOAL #1   Title indepednent with initial HEP    Status Achieved             PT Long Term Goals - 11/10/20 0954      PT LONG TERM GOAL #1   Title understand posture and body mechanics    Status On-going      PT LONG TERM GOAL #2   Title incresae lumbar ROM 50%    Status Achieved      PT LONG TERM GOAL #3   Title decrease pain overall 50%    Status Partially Met                 Plan - 11/15/20 1020    Clinical Impression Statement Patient reporting R sided groin pain today- notes that she suspects that she either has a new kidney stone or has not passed her current one. Of note, patient had an ED visit on 11/11/31 for kidney stone. Patient does report that her back pain and kidney stones did start around the same time. Notes that she has started drinking more water and avoiding drinking Monster energy drinks. Consistent cueing requiring for pacing today, as patient demonstrates tendency to perform exercises quickly. Patient performed progressive core strengthening today with excellent tolerance.  Limited ROM was demonstrated with bridges d/t glute weakness vs. hip flexor tightness. Patient did tolerate hip flexor stretching well, but reporting no benefit in groin pain. Increased LE strengthening challenge with wall squats and banded stepping, with which patient reported muscle fatigue. Patient reported understanding of HEP update and without complaints at end of session.    PT Frequency 2x / week    PT Duration 6 weeks    PT Treatment/Interventions ADLs/Self Care Home Management;Cryotherapy;Electrical Stimulation;Moist Heat;Traction;Gait training;Stair training;Functional mobility training;Therapeutic activities;Therapeutic exercise;Neuromuscular re-education;Manual techniques;Patient/family education;Dry needling    PT Next Visit Plan continue with working on core strength and stability, safety with body mechanics at home and if she transitions to the gym    Consulted and Agree with Plan of Care Patient           Patient will benefit from skilled therapeutic intervention in order to improve the following deficits and impairments:  Decreased range of motion,Difficulty walking,Increased muscle spasms,Decreased activity tolerance,Pain,Improper body mechanics,Impaired flexibility,Postural dysfunction,Decreased strength  Visit Diagnosis: Acute bilateral low back pain without sciatica  Muscle spasm of back     Problem List Patient Active Problem List   Diagnosis Date Noted  . Lumbar radiculopathy 10/06/2020  . Wrist sprain, left, subsequent encounter 09/22/2020     Janene Harvey, PT, DPT 11/15/20 10:21 AM   [facility] [address]  [place], [place], [address] Phone: [phone]   Fax:  [phone]  Name: Zorina Mallin MRN: [id number] Date of Birth: November 08, 1980

## 2020-11-15 NOTE — Discharge Instructions (Signed)
Take the medications for pain as prescribed.  Follow-up with your doctor for further evaluation if symptoms persist.

## 2020-11-17 ENCOUNTER — Encounter: Payer: Self-pay | Admitting: Physical Therapy

## 2020-11-17 ENCOUNTER — Ambulatory Visit: Payer: Medicaid Other | Attending: Orthopedic Surgery | Admitting: Physical Therapy

## 2020-11-17 ENCOUNTER — Other Ambulatory Visit: Payer: Self-pay

## 2020-11-17 DIAGNOSIS — M545 Low back pain, unspecified: Secondary | ICD-10-CM | POA: Diagnosis present

## 2020-11-17 DIAGNOSIS — M62830 Muscle spasm of back: Secondary | ICD-10-CM | POA: Diagnosis present

## 2020-11-17 NOTE — Therapy (Addendum)
[facility] [address]  [place], [place], [address] Phone: [phone]   Fax:  [phone]  Physical Therapy Treatment  Patient Details  Name: Katie Spencer MRN: [id number] Date of Birth: December 03, 1980 Referring Provider (PT): Dumonski   Encounter Date: 11/17/2020   PT End of Session - 11/17/20 1010    Visit Number 7    Number of Visits 12    Date for PT Re-Evaluation 12/05/20    Authorization Type [employer] medicaid    PT Start Time 0929    PT Stop Time 1009    PT Time Calculation (min) 40 min    Activity Tolerance Patient tolerated treatment well;Patient limited by pain    Behavior During Therapy [facility] for tasks assessed/performed           Past Medical History:  Diagnosis Date  . Abnormal vaginal bleeding   . Chronic abdominal pain   . Renal disorder     Past Surgical History:  Procedure Laterality Date  . ABDOMINAL HYSTERECTOMY    . CESAREAN SECTION  2006, 2009, 2013, 2014  . CHOLECYSTECTOMY      There were no vitals filed for this visit.   Subjective Assessment - 11/17/20 0936    Subjective Had to go to the ER for flank pain. Notes that she passed one of her kidney stones, but has another one in the R.    Patient Stated Goals have less pain, feel stronger, maybe go to a gym to help be stronger but be safe    Currently in Pain? No/denies                             [facility] Adult PT Treatment/Exercise - 11/17/20 0001      Lumbar Exercises: Aerobic   Nustep level 4 x 7 minutes (UEs/LEs)      Lumbar Exercises: Standing   Row Strengthening;Both;10 reps;Theraband    Theraband Level (Row) Level 3 (Green)    Row Limitations 2x10; cues for slight posterior pelvic tilt and avoiding extending shoulders past neutral    Shoulder Extension Strengthening;Both;10 reps;Theraband    Theraband Level (Shoulder Extension) Level 2 (Red)    Shoulder Extension Limitations 2x10; cues for slight  posterior pelvic tilt      Lumbar Exercises: Supine   Bridge with [employer] 10 reps    Bridge with clamshell 10 reps   red TB     Lumbar Exercises: Sidelying   Clam Right;Left;10 reps    Clam Limitations 2x10; red TB   good form     Lumbar Exercises: Prone   Other Prone Lumbar Exercises R/L prone donkey kicks   unable to tolerate d/t pain in abdomen   Other Prone Lumbar Exercises partial press up on hands 10x3"   hips coming off of table     Lumbar Exercises: Quadruped   [place]/Old Horse 10 reps    Other Quadruped Lumbar Exercises bird dog x10   manual cues to avoid trunk/hip rotation     Manual Therapy   Manual Traction manual seatbelt traction holding 3 breaths repeating and working on her relaxing and allowing the decompression                  PT Education - 11/17/20 1010    Education Details update to HEP    Person(s) Educated Patient    Methods Explanation;Demonstration;Tactile cues;Verbal cues;Handout    Comprehension Verbalized understanding;Returned demonstration  PT Short Term Goals - 10/27/20 1144      PT SHORT TERM GOAL #1   Title indepednent with initial HEP    Status Achieved             PT Long Term Goals - 11/10/20 0954      PT LONG TERM GOAL #1   Title understand posture and body mechanics    Status On-going      PT LONG TERM GOAL #2   Title incresae lumbar ROM 50%    Status Achieved      PT LONG TERM GOAL #3   Title decrease pain overall 50%    Status Partially Met                 Plan - 11/17/20 1010    Clinical Impression Statement Patient arrived to session after another ED visit on 11/15/20 for R flank pain- all testing appeared reassuring. Patient reports that she still has another R kidney stone. Patient asymptomatic today. Worked on progressive core and hip strengthening with added challenges. Patient tolerated initiation of resisted clamshells with excellent form today. Attempted to perform prone hip  extension which was discontinued d/t abdominal pain. Patient requested manual traction, which was well-tolerated and patient noted good relief. Updated HEP with core strengthening ther-ex which was well-tolerated today. Patient reported understanding and without complaints at end of session.    PT Frequency 2x / week    PT Duration 6 weeks    PT Treatment/Interventions ADLs/Self Care Home Management;Cryotherapy;Electrical Stimulation;Moist Heat;Traction;Gait training;Stair training;Functional mobility training;Therapeutic activities;Therapeutic exercise;Neuromuscular re-education;Manual techniques;Patient/family education;Dry needling    PT Next Visit Plan continue with working on core strength and stability, safety with body mechanics at home and if she transitions to the gym    Consulted and Agree with Plan of Care Patient           Patient will benefit from skilled therapeutic intervention in order to improve the following deficits and impairments:  Decreased range of motion,Difficulty walking,Increased muscle spasms,Decreased activity tolerance,Pain,Improper body mechanics,Impaired flexibility,Postural dysfunction,Decreased strength  Visit Diagnosis: Acute bilateral low back pain without sciatica  Muscle spasm of back     Problem List Patient Active Problem List   Diagnosis Date Noted  . Lumbar radiculopathy 10/06/2020  . Wrist sprain, left, subsequent encounter 09/22/2020     Janene Harvey, PT, DPT 11/17/20 10:16 AM   [facility] [address]  [place], [place], [address] Phone: [phone]   Fax:  [phone]  Name: Katie Spencer MRN: [id number] Date of Birth: 1981-08-29  PHYSICAL THERAPY DISCHARGE SUMMARY  Visits from Start of Care: 7  Current functional level related to goals / functional outcomes: Unable to assess; patient did not return d/t other health issues   Remaining  deficits: Unable to assess   Education / Equipment: HEP  Plan: Patient agrees to discharge.  Patient goals were not met. Patient is being discharged due to not returning since the last visit.  ?????     Janene Harvey, PT, DPT 12/21/20 11:54 AM

## 2020-11-22 ENCOUNTER — Ambulatory Visit: Payer: Medicaid Other

## 2020-11-25 ENCOUNTER — Encounter: Payer: Medicaid Other | Admitting: Physical Therapy

## 2020-12-01 ENCOUNTER — Ambulatory Visit: Payer: Medicaid Other | Admitting: Family Medicine

## 2020-12-02 ENCOUNTER — Encounter (HOSPITAL_COMMUNITY): Payer: Self-pay

## 2020-12-02 ENCOUNTER — Other Ambulatory Visit: Payer: Self-pay

## 2020-12-02 DIAGNOSIS — Z90710 Acquired absence of both cervix and uterus: Secondary | ICD-10-CM | POA: Diagnosis not present

## 2020-12-02 DIAGNOSIS — Z79899 Other long term (current) drug therapy: Secondary | ICD-10-CM | POA: Diagnosis not present

## 2020-12-02 DIAGNOSIS — R112 Nausea with vomiting, unspecified: Secondary | ICD-10-CM | POA: Insufficient documentation

## 2020-12-02 DIAGNOSIS — F17210 Nicotine dependence, cigarettes, uncomplicated: Secondary | ICD-10-CM | POA: Insufficient documentation

## 2020-12-02 DIAGNOSIS — R1030 Lower abdominal pain, unspecified: Secondary | ICD-10-CM | POA: Diagnosis present

## 2020-12-02 DIAGNOSIS — N200 Calculus of kidney: Secondary | ICD-10-CM | POA: Diagnosis not present

## 2020-12-02 DIAGNOSIS — Z9049 Acquired absence of other specified parts of digestive tract: Secondary | ICD-10-CM | POA: Insufficient documentation

## 2020-12-02 DIAGNOSIS — Z885 Allergy status to narcotic agent status: Secondary | ICD-10-CM | POA: Diagnosis not present

## 2020-12-02 LAB — COMPREHENSIVE METABOLIC PANEL
ALT: 50 U/L — ABNORMAL HIGH (ref 0–44)
AST: 45 U/L — ABNORMAL HIGH (ref 15–41)
Albumin: 4.4 g/dL (ref 3.5–5.0)
Alkaline Phosphatase: 88 U/L (ref 38–126)
Anion gap: 11 (ref 5–15)
BUN: 13 mg/dL (ref 6–20)
CO2: 20 mmol/L — ABNORMAL LOW (ref 22–32)
Calcium: 9 mg/dL (ref 8.9–10.3)
Chloride: 104 mmol/L (ref 98–111)
Creatinine, Ser: 0.77 mg/dL (ref 0.44–1.00)
GFR, Estimated: >60 mL/min (ref >60)
Glucose, Bld: 109 mg/dL — ABNORMAL HIGH (ref 70–99)
Potassium: 3.5 mmol/L (ref 3.5–5.1)
Sodium: 135 mmol/L (ref 135–145)
Total Bilirubin: 0.8 mg/dL (ref 0.3–1.2)
Total Protein: 7.3 g/dL (ref 6.5–8.1)

## 2020-12-02 LAB — CBC
HCT: 36.5 % (ref 36.0–46.0)
Hemoglobin: 13 g/dL (ref 12.0–15.0)
MCH: 32.2 pg (ref 26.0–34.0)
MCHC: 35.6 g/dL (ref 30.0–36.0)
MCV: 90.3 fL (ref 80.0–100.0)
Platelets: 253 10*3/uL (ref 150–400)
RBC: 4.04 MIL/uL (ref 3.87–5.11)
RDW: 11.6 % (ref 11.5–15.5)
WBC: 10.4 10*3/uL (ref 4.0–10.5)
nRBC: 0 % (ref 0.0–0.2)

## 2020-12-02 LAB — I-STAT BETA HCG BLOOD, ED (MC, WL, AP ONLY): I-stat hCG, quantitative: <5 m[IU]/mL (ref <5)

## 2020-12-02 LAB — LIPASE, BLOOD: Lipase: 34 U/L (ref 11–51)

## 2020-12-02 NOTE — ED Triage Notes (Signed)
Pt c/o lower abdominal pain x 1 week. Cramping increased today. Pt sts wiping after urinating and small amount of blood present. Pt says it feels similar to her stones.

## 2020-12-03 ENCOUNTER — Emergency Department (HOSPITAL_COMMUNITY): Payer: Medicaid Other

## 2020-12-03 ENCOUNTER — Encounter (HOSPITAL_COMMUNITY): Payer: Self-pay | Admitting: Student

## 2020-12-03 ENCOUNTER — Emergency Department (HOSPITAL_COMMUNITY)
Admission: EM | Admit: 2020-12-03 | Discharge: 2020-12-03 | Disposition: A | Discharge status: Home / Self Care | Payer: Medicaid Other | Attending: Emergency Medicine | Admitting: Emergency Medicine

## 2020-12-03 DIAGNOSIS — R1030 Lower abdominal pain, unspecified: Secondary | ICD-10-CM

## 2020-12-03 DIAGNOSIS — R102 Pelvic and perineal pain: Secondary | ICD-10-CM

## 2020-12-03 LAB — URINALYSIS, ROUTINE W REFLEX MICROSCOPIC
Bacteria, UA: NONE SEEN
Bilirubin Urine: NEGATIVE
Glucose, UA: NEGATIVE mg/dL
Ketones, ur: NEGATIVE mg/dL
Leukocytes,Ua: NEGATIVE
Nitrite: NEGATIVE
Protein, ur: NEGATIVE mg/dL
Specific Gravity, Urine: 1.026 (ref 1.005–1.030)
pH: 6 (ref 5.0–8.0)

## 2020-12-03 LAB — WET PREP, GENITAL
Clue Cells Wet Prep HPF POC: NONE SEEN
Sperm: NONE SEEN
Trich, Wet Prep: NONE SEEN
WBC, Wet Prep HPF POC: NONE SEEN
Yeast Wet Prep HPF POC: NONE SEEN

## 2020-12-03 LAB — GC/CHLAMYDIA PROBE AMP (~~LOC~~) NOT AT ARMC
Chlamydia: NEGATIVE
Comment: NEGATIVE
Comment: NORMAL
Neisseria Gonorrhea: NEGATIVE

## 2020-12-03 MED ORDER — FENTANYL CITRATE (PF) 100 MCG/2ML IJ SOLN
50.0000 ug | Freq: Once | INTRAMUSCULAR | Status: AC
  Administered 2020-12-03: 50 ug via INTRAVENOUS
  Filled 2020-12-03: qty 2

## 2020-12-03 MED ORDER — ONDANSETRON 4 MG PO TBDP: 4.0000 mg | ORAL_TABLET | Freq: Three times a day (TID) | ORAL | 0 refills | Status: DC | PRN

## 2020-12-03 MED ORDER — MORPHINE SULFATE (PF) 4 MG/ML IV SOLN
4.0000 mg | Freq: Once | INTRAVENOUS | Status: DC
  Filled 2020-12-03: qty 1

## 2020-12-03 MED ORDER — DICYCLOMINE HCL 20 MG PO TABS: 20.0000 mg | ORAL_TABLET | Freq: Three times a day (TID) | ORAL | 0 refills | Status: DC | PRN

## 2020-12-03 MED ORDER — DICYCLOMINE HCL 10 MG/ML IM SOLN
20.0000 mg | Freq: Once | INTRAMUSCULAR | Status: AC
  Administered 2020-12-03: 20 mg via INTRAMUSCULAR
  Filled 2020-12-03: qty 2

## 2020-12-03 MED ORDER — SODIUM CHLORIDE 0.9 % IV BOLUS
1000.0000 mL | Freq: Once | INTRAVENOUS | Status: AC
  Administered 2020-12-03: 1000 mL via INTRAVENOUS

## 2020-12-03 MED ORDER — HYDROMORPHONE HCL 1 MG/ML IJ SOLN
0.5000 mg | Freq: Once | INTRAMUSCULAR | Status: AC
Start: 2020-12-03 — End: 2020-12-03
  Administered 2020-12-03: 0.5 mg via INTRAVENOUS
  Filled 2020-12-03: qty 1

## 2020-12-03 MED ORDER — ONDANSETRON HCL 4 MG/2ML IJ SOLN
4.0000 mg | Freq: Once | INTRAMUSCULAR | Status: AC
  Administered 2020-12-03: 4 mg via INTRAVENOUS
  Filled 2020-12-03: qty 2

## 2020-12-03 NOTE — Discharge Instructions (Addendum)
You were seen in the emergency department today for abdominal pain.  Your x-ray and ultrasound were reassuring.  Your blood work looks similar to prior labs you have had done with some blood in your urine and mildly elevated liver function test.  We are sending home with Bentyl to take every 8 hours as needed for abdominal cramping and Zofran to take every 8 hours as needed for nausea and vomiting.  We have prescribed you new medication(s) today. Discuss the medications prescribed today with your pharmacist as they can have adverse effects and interactions with your other medicines including over the counter and prescribed medications. Seek medical evaluation if you start to experience new or abnormal symptoms after taking one of these medicines, seek care immediately if you start to experience difficulty breathing, feeling of your throat closing, facial swelling, or rash as these could be indications of a more serious allergic reaction  Please follow-up with GI as scheduled today and call your OB/GYN to schedule a follow-up appointment soon as possible as well too. Return to the ER for new or worsening symptoms including but not limited to new or worsening pain, localization of pain to one side, fever, inability to keep fluids down, blood in your stool, blood in vomit, passing out, or any other concerns.

## 2020-12-03 NOTE — ED Provider Notes (Signed)
[facility]-EMERGENCY DEPT Provider Note   CSN: [id number] Arrival date & time: 12/02/20  2243     History Chief Complaint  Patient presents with  . Abdominal Pain    Katie Spencer is a 40 y.o. female with a hx of tobacco use, chronic abdominal pain, & prior hysterectomy & cholecystectomy who presents to the ED with abdominal pain x 1 week. Patient states pain waxes/wanes to the lower abdomen, feels crampy in nature with intermittent sharp waves of pain. Seemed worse today with associated nausea, 2 episodes of emesis, and noted some blood on toilet paper after urinating- unsure if coming from urine or vagina, this has stopped now. Has not had a period in over a year. Has had dyspareunia over the weekend.  She denies hematemesis, diarrhea, hematochezia, dysuria, or vaginal discharge.  She is sexually active in a monogamous relationship with her husband  HPI     Past Medical History:  Diagnosis Date  . Abnormal vaginal bleeding   . Chronic abdominal pain   . Renal disorder     Patient Active Problem List   Diagnosis Date Noted  . Lumbar radiculopathy 10/06/2020  . Wrist sprain, left, subsequent encounter 09/22/2020    Past Surgical History:  Procedure Laterality Date  . ABDOMINAL HYSTERECTOMY    . CESAREAN SECTION  2006, 2009, 2013, 2014  . CHOLECYSTECTOMY       OB History   No obstetric history on file.     History reviewed. No pertinent family history.  Social History   Tobacco Use  . Smoking status: Current Every Day Smoker    Packs/day: 1.00    Types: Cigarettes  . Smokeless tobacco: Never Used  Vaping Use  . Vaping Use: Never used  Substance Use Topics  . Alcohol use: Not Currently  . Drug use: Never    Home Medications Prior to Admission medications   Medication Sig Start Date End Date Taking? Authorizing Provider  acetaminophen (TYLENOL) 500 MG tablet Take 1,000 mg by mouth every 6 (six) hours as needed for moderate pain.     [provider]  butalbital-acetaminophen-caffeine (FIORICET) 50-325-40 MG tablet Take 1 tablet by mouth 2 (two) times daily as needed for headache.    [provider]  citalopram (CELEXA) 40 MG tablet Take 40 mg by mouth daily. 01/01/20   [provider]  cyclobenzaprine (FLEXERIL) 10 MG tablet Take 1 tablet (10 mg total) by mouth 3 (three) times daily as needed for muscle spasms. 11/15/20   Linwood DibblesKnapp, Jon, MD  diazepam (VALIUM) 5 MG tablet Take 1 tablet (5 mg total) by mouth every 8 (eight) hours as needed for muscle spasms. 09/30/20   Molpus, John, MD  etodolac (LODINE) 300 MG capsule Take 1 capsule (300 mg total) by mouth every 8 (eight) hours. 11/15/20   Linwood DibblesKnapp, Jon, MD  hydrOXYzine (ATARAX/VISTARIL) 25 MG tablet Take 25 mg by mouth 3 (three) times daily as needed for anxiety.  04/30/20   [provider]  ibuprofen (ADVIL) 800 MG tablet Take 1 tablet (800 mg total) by mouth every 8 (eight) hours as needed. Patient taking differently: Take 800 mg by mouth every 8 (eight) hours as needed for mild pain. 09/20/20   Placido SouJoldersma, Logan, PA-C  methocarbamol (ROBAXIN) 500 MG tablet Take 500 mg by mouth daily as needed for muscle spasms. 10/05/20   [provider]  MOBIC 15 MG tablet Take 15 mg by mouth daily as needed for pain. 10/05/20   [provider]  ondansetron (ZOFRAN ODT) 4 MG disintegrating tablet 4mg  ODT q4 hours prn nausea/vomit Patient taking differently: Take 4 mg by mouth every 4 (four) hours as needed for nausea or vomiting. 10/24/20   12/22/20, PA-C  oxyCODONE-acetaminophen (PERCOCET/ROXICET) 5-325 MG tablet Take 1 tablet by mouth every 8 (eight) hours as needed for severe pain. Patient not taking: No sig reported 11/01/20   11/03/20, PA-C  pantoprazole (PROTONIX) 40 MG tablet Take 1 tablet (40 mg total) by mouth 2 (two) times daily. 10/24/20 11/23/20  01/23/21, PA-C  tamsulosin (FLOMAX) 0.4 MG CAPS capsule Take 1 capsule (0.4 mg total)  by mouth daily. 10/24/20   12/22/20, PA-C  VRAYLAR 6 MG CAPS Take 6 mg by mouth at bedtime.  01/02/20   [provider]  belladonna-opium (B&O SUPPRETTES) 16.2-30 MG suppository Place 1 suppository rectally every 8 (eight) hours as needed for pain. Patient not taking: Reported on 05/27/2020 05/20/20 08/18/20  14/1/21, MD  lansoprazole (PREVACID) 30 MG capsule Take 1 capsule daily while taking ibuprofen. Patient not taking: Reported on 05/27/2020 04/16/20 08/18/20  Molpus, 14/1/21, MD    Allergies    Morphine and related and Almond (diagnostic)  Review of Systems   Review of Systems  Constitutional: Negative for chills and fever.  Respiratory: Negative for shortness of breath.   Cardiovascular: Negative for chest pain.  Gastrointestinal: Positive for abdominal pain, nausea and vomiting. Negative for blood in stool, constipation and diarrhea.  Genitourinary: Negative for dysuria.       Positive for GU blood- hematuria vs. Vaginal bleeding.   All other systems reviewed and are negative.   Physical Exam Updated Vital Signs BP 122/70   Pulse 67   Temp 98 F (36.7 C) (Oral)   Resp 17   Ht 5\' 4"  (1.626 m)   Wt 104.3 kg   LMP 04/04/2020   SpO2 100%   BMI 39.48 kg/m   Physical Exam Vitals and nursing note reviewed.  Constitutional:      General: She is not in acute distress.    Appearance: She is well-developed. She is not toxic-appearing.  HENT:     Head: Normocephalic and atraumatic.  Eyes:     General:        Right eye: No discharge.        Left eye: No discharge.     Conjunctiva/sclera: Conjunctivae normal.  Cardiovascular:     Rate and Rhythm: Normal rate and regular rhythm.  Pulmonary:     Effort: Pulmonary effort is normal. No respiratory distress.     Breath sounds: Normal breath sounds. No wheezing, rhonchi or rales.  Abdominal:     General: There is no distension.     Palpations: Abdomen is soft.     Tenderness: There is abdominal tenderness in  the right lower quadrant, suprapubic area and left lower quadrant. There is no guarding or rebound.  Genitourinary:    Comments: Chaperone present.  No significant discharge or bleeding on speculum exam.  Diffuse tenderness throughout bimanual exam.  Musculoskeletal:     Cervical back: Neck supple.  Skin:    General: Skin is warm and dry.     Findings: No rash.  Neurological:     Mental Status: She is alert.     Comments: Clear speech.   Psychiatric:        Behavior: Behavior normal.    ED Results / Procedures / Treatments   Labs (all labs ordered are listed,  but only abnormal results are displayed) Labs Reviewed  COMPREHENSIVE METABOLIC PANEL - Abnormal; Notable for the following components:      Result Value   CO2 20 (*)    Glucose, Bld 109 (*)    AST 45 (*)    ALT 50 (*)    All other components within normal limits  URINALYSIS, ROUTINE W REFLEX MICROSCOPIC - Abnormal; Notable for the following components:   APPearance HAZY (*)    Hgb urine dipstick MODERATE (*)    All other components within normal limits  WET PREP, GENITAL  LIPASE, BLOOD  CBC  I-STAT BETA HCG BLOOD, ED ([facility], [facility], [facility] ONLY)  GC/CHLAMYDIA PROBE AMP ([facility]) NOT AT [facility]    EKG None  Radiology DG Abd Acute W/Chest  Result Date: 12/03/2020 CLINICAL DATA:  40 year old female with lower abdominal pain and cramping for 24 hours. Hematuria. History of kidney stones. EXAM: DG ABDOMEN ACUTE WITH 1 VIEW CHEST COMPARISON:  CT Abdomen and Pelvis 11/15/2020 and earlier. FINDINGS: Lung volumes and mediastinal contours remain normal. Visualized tracheal air column is within normal limits. Both lungs appear stable and clear. No pneumothorax or pneumoperitoneum. Stable cholecystectomy clips. Non obstructed bowel gas pattern. Chronic left hemipelvis phleboliths are stable. Right nephrolithiasis on CT last month is stable, visible at the lower pole (arrow). No other urinary calculus identified. Stable other abdominal  visceral contours. No acute osseous abnormality identified. IMPRESSION: 1. Stable right nephrolithiasis from the CT last month. 2. Normal bowel gas pattern, no free air. 3. Negative chest. Electronically Signed   By: Odessa Fleming M.D.   On: 12/03/2020 05:50   US PELVIC COMPLETE W TRANSVAGINAL AND TORSION R/O  Result Date: 12/03/2020 CLINICAL DATA:  Initial evaluation for acute pelvic pain. History of prior hysterectomy. EXAM: TRANSABDOMINAL AND TRANSVAGINAL ULTRASOUND OF PELVIS DOPPLER ULTRASOUND OF OVARIES TECHNIQUE: Both transabdominal and transvaginal ultrasound examinations of the pelvis were performed. Transabdominal technique was performed for global imaging of the pelvis including uterus, ovaries, adnexal regions, and pelvic cul-de-sac. It was necessary to proceed with endovaginal exam following the transabdominal exam to visualize the pelvic structures. Color and duplex Doppler ultrasound was utilized to evaluate blood flow to the ovaries. COMPARISON:  Prior CT from 11/15/2020 FINDINGS: Uterus Prior hysterectomy.  No abnormality about the vaginal cuff. Endometrium Surgically absent. Right ovary Not visualized.  No adnexal mass. Left ovary Measurements: 3.0 x 1.8 x 2.0 cm = volume: 5.6 mL. Normal appearance/no adnexal mass. Pulsed Doppler evaluation of the left ovary demonstrates normal low-resistance arterial and venous waveforms. Other findings No abnormal free fluid. IMPRESSION: 1. No acute abnormality within the pelvis. 2. Normal sonographic appearance of the left ovary with no evidence for torsion. 3. Nonvisualization of the right ovary. No other adnexal mass or free fluid. 4. Prior hysterectomy. Electronically Signed   By: Rise Mu M.D.   On: 12/03/2020 04:13    Procedures Procedures   Medications Ordered in ED Medications  ondansetron (ZOFRAN) injection 4 mg (4 mg Intravenous Given 12/03/20 0232)  sodium chloride 0.9 % bolus 1,000 mL (1,000 mLs Intravenous New Bag/Given 12/03/20 0236)   fentaNYL (SUBLIMAZE) injection 50 mcg (50 mcg Intravenous Given 12/03/20 0248)    ED Course  I have reviewed the triage vital signs and the nursing notes.  Pertinent labs & imaging results that were available during my care of the patient were reviewed by me and considered in my medical decision making (see chart for details).    MDM Rules/Calculators/A&P  Patient presents to the ED with complaints of abdominal pain. Patient nontoxic appearing, in no apparent distress, vitals without significant abnormality. On exam patient tender to the lower abdomen & with bimanual exam, no peritoneal signs. Will evaluate with labs and [place]. Analgesics, anti-emetics, and fluids administered.   Additional history obtained:  Additional history obtained from chart review & nursing note review.   Lab Tests:  I Ordered, reviewed, and interpreted labs, which included:  CBC: Unremarkable CMP: Mildly elevated LFTs similar to prior. Lipase: Within normal limits UA: Blood present but no UTI Preg test: Negative Wet prep: Unremarkable.   Imaging Studies ordered:  I ordered imaging studies which included pelvic US & acute abdominal series, I independently reviewed, formal radiology impression shows:  [place]:  1. No acute abnormality within the pelvis. 2. Normal sonographic appearance of the left ovary with no evidence for torsion. 3. Nonvisualization of the right ovary. No other adnexal mass or free fluid. 4. Prior hysterectomy. X-ray: 1. Stable right nephrolithiasis from the CT last month. 2. Normal bowel gas pattern, no free air. 3. Negative chest  ED Course:  Patient with multiple prior CT images so far this year, I have a low suspicion for acute surgical pathology at this time, we discussed further imaging, shared decision making utilized with decision not to proceed w/ CT imaging.  S/p cholecystectomy.  Lipase normal, lower pain- doubt pancreatitis.  Pain x 1 week, diffuse lower, not  localized to RLQ, no leukocytosis- low suspicion for appendicitis.  No obvious perf/obstruction on x-ray.  No prior diverticulosis on CT review, no stool changes- doubt diverticulitis.  No significant discharge on exam, monogamous- doubt PID [place] reassuring- no obvious torsion.  Preg test negative- doubt ectopic or IUP.   Unclear definitive etiology to patient's pain, remains without peritoneal signs, feeling improved & tolerating PO in the ED. Will trial bentyl & zofran at home for supportive care, NSAIDs avoided as patient states she has a stomach ulcer, has follow up with GI today, recommended GYN follow up as well.  Will discharge home with supportive measures. I discussed results, treatment plan, need for follow-up, and return precautions with the patient. Provided opportunity for questions, patient confirmed understanding and is in agreement with plan.   Findings and plan of care discussed with supervising physician Dr. Read Drivers who is in agreement.   Portions of this note were generated with Scientist, clinical (histocompatibility and immunogenetics). Dictation errors may occur despite best attempts at proofreading.  Final Clinical Impression(s) / ED Diagnoses Final diagnoses:  Lower abdominal pain    Rx / DC Orders ED Discharge Orders         Ordered    dicyclomine (BENTYL) 20 MG tablet  Every 8 hours PRN        12/03/20 0602    ondansetron (ZOFRAN ODT) 4 MG disintegrating tablet  Every 8 hours PRN        12/03/20 0602           Petrucelli, Pleas Koch, PA-C 12/03/20 0605    Paula Libra, MD 12/03/20 2308

## 2020-12-07 ENCOUNTER — Ambulatory Visit: Payer: Medicaid Other

## 2020-12-08 ENCOUNTER — Other Ambulatory Visit: Payer: Self-pay

## 2020-12-08 ENCOUNTER — Encounter: Payer: Self-pay | Admitting: Family Medicine

## 2020-12-08 ENCOUNTER — Ambulatory Visit (INDEPENDENT_AMBULATORY_CARE_PROVIDER_SITE_OTHER): Payer: Medicaid Other | Admitting: Family Medicine

## 2020-12-08 DIAGNOSIS — M5416 Radiculopathy, lumbar region: Secondary | ICD-10-CM

## 2020-12-08 NOTE — Progress Notes (Signed)
  Katie Spencer - 40 y.o. female MRN [id number]  Date of birth: 30-Mar-1981  SUBJECTIVE:  Including CC & ROS.  No chief complaint on file.   Katie Spencer is a 40 y.o. female that is following up on her back pain.  She reports no pain today.   Review of Systems See HPI   HISTORY: Past Medical, Surgical, Social, and Family History Reviewed & Updated per EMR.   Pertinent Historical Findings include:  Past Medical History:  Diagnosis Date  . Abnormal vaginal bleeding   . Chronic abdominal pain   . Renal disorder     Past Surgical History:  Procedure Laterality Date  . ABDOMINAL HYSTERECTOMY    . CESAREAN SECTION  2006, 2009, 2013, 2014  . CHOLECYSTECTOMY      No family history on file.  Social History   Socioeconomic History  . Marital status: Married    Spouse name: Not on file  . Number of children: Not on file  . Years of education: Not on file  . Highest education level: Not on file  Occupational History  . Not on file  Tobacco Use  . Smoking status: Current Every Day Smoker    Packs/day: 1.00    Types: Cigarettes  . Smokeless tobacco: Never Used  Vaping Use  . Vaping Use: Never used  Substance and Sexual Activity  . Alcohol use: Not Currently  . Drug use: Never  . Sexual activity: Not on file  Other Topics Concern  . Not on file  Social History Narrative  . Not on file   Social Determinants of Health   Financial Resource Strain: Not on file  Food Insecurity: Not on file  Transportation Needs: Not on file  Physical Activity: Not on file  Stress: Not on file  Social Connections: Not on file  Intimate Partner Violence: Not on file     PHYSICAL EXAM:  VS: BP 128/80 (BP Location: Left Arm, Patient Position: Sitting, Cuff Size: Large)   Ht 5\' 4"  (1.626 m)   Wt 230 lb (104.3 kg)   LMP 04/04/2020   BMI 39.48 kg/m  Physical Exam Gen: NAD, alert, cooperative with exam, well-appearing    ASSESSMENT & PLAN:   Lumbar radiculopathy Denies  any pain today. -Counseled on home exercise therapy and supportive care. -Can continue physical therapy. -Could consider epidural in the future if needed.

## 2020-12-08 NOTE — Assessment & Plan Note (Signed)
Denies any pain today. -Counseled on home exercise therapy and supportive care. -Can continue physical therapy. -Could consider epidural in the future if needed.

## 2020-12-13 ENCOUNTER — Ambulatory Visit: Payer: Medicaid Other | Admitting: Physical Therapy

## 2020-12-22 ENCOUNTER — Other Ambulatory Visit: Payer: Self-pay

## 2020-12-22 ENCOUNTER — Emergency Department (HOSPITAL_BASED_OUTPATIENT_CLINIC_OR_DEPARTMENT_OTHER): Payer: Medicaid Other

## 2020-12-22 ENCOUNTER — Emergency Department (HOSPITAL_BASED_OUTPATIENT_CLINIC_OR_DEPARTMENT_OTHER)
Admission: EM | Admit: 2020-12-22 | Discharge: 2020-12-22 | Disposition: A | Discharge status: Home / Self Care | Payer: Medicaid Other | Attending: Emergency Medicine | Admitting: Emergency Medicine

## 2020-12-22 ENCOUNTER — Encounter (HOSPITAL_BASED_OUTPATIENT_CLINIC_OR_DEPARTMENT_OTHER): Payer: Self-pay | Admitting: Emergency Medicine

## 2020-12-22 DIAGNOSIS — W108XXA Fall (on) (from) other stairs and steps, initial encounter: Secondary | ICD-10-CM | POA: Insufficient documentation

## 2020-12-22 DIAGNOSIS — S7002XA Contusion of left hip, initial encounter: Secondary | ICD-10-CM

## 2020-12-22 DIAGNOSIS — S79912A Unspecified injury of left hip, initial encounter: Secondary | ICD-10-CM | POA: Diagnosis present

## 2020-12-22 DIAGNOSIS — F17210 Nicotine dependence, cigarettes, uncomplicated: Secondary | ICD-10-CM | POA: Insufficient documentation

## 2020-12-22 DIAGNOSIS — R102 Pelvic and perineal pain: Secondary | ICD-10-CM | POA: Insufficient documentation

## 2020-12-22 MED ORDER — ACETAMINOPHEN 325 MG PO TABS
650.0000 mg | ORAL_TABLET | Freq: Once | ORAL | Status: AC
  Administered 2020-12-22: 650 mg via ORAL
  Filled 2020-12-22: qty 2

## 2020-12-22 NOTE — ED Provider Notes (Signed)
[facility] EMERGENCY DEPARTMENT Provider Note   CSN: [id number] Arrival date & time: 12/22/20  0301   History Chief Complaint  Patient presents with  . Groin Pain    Katie Spencer is a 40 y.o. female.  The history is provided by the patient.  Groin Pain  She tripped over her dog's, while going down some steps and fell but down about 9 steps.  She is complaining of pain in her left hip and pelvis.  Pain is rated at 8/10.  She denies head, neck, back, chest, abdomen, upper extremity injury.  Past Medical History:  Diagnosis Date  . Abnormal vaginal bleeding   . Chronic abdominal pain   . Renal disorder     Patient Active Problem List   Diagnosis Date Noted  . Lumbar radiculopathy 10/06/2020  . Wrist sprain, left, subsequent encounter 09/22/2020    Past Surgical History:  Procedure Laterality Date  . ABDOMINAL HYSTERECTOMY    . CESAREAN SECTION  2006, 2009, 2013, 2014  . CHOLECYSTECTOMY       OB History   No obstetric history on file.     No family history on file.  Social History   Tobacco Use  . Smoking status: Current Every Day Smoker    Packs/day: 1.00    Types: Cigarettes  . Smokeless tobacco: Never Used  Vaping Use  . Vaping Use: Never used  Substance Use Topics  . Alcohol use: Not Currently  . Drug use: Never    Home Medications Prior to Admission medications   Medication Sig Start Date End Date Taking? Authorizing Provider  acetaminophen (TYLENOL) 500 MG tablet Take 1,000 mg by mouth every 6 (six) hours as needed for moderate pain.    [provider]  butalbital-acetaminophen-caffeine (FIORICET) 50-325-40 MG tablet Take 1 tablet by mouth 2 (two) times daily as needed for headache.    [provider]  citalopram (CELEXA) 40 MG tablet Take 40 mg by mouth daily. 01/01/20   [provider]  cyclobenzaprine (FLEXERIL) 10 MG tablet Take 1 tablet (10 mg total) by mouth 3 (three) times daily as needed for muscle  spasms. 11/15/20   Linwood Dibbles, MD  diazepam (VALIUM) 5 MG tablet Take 1 tablet (5 mg total) by mouth every 8 (eight) hours as needed for muscle spasms. 09/30/20   Molpus, John, MD  dicyclomine (BENTYL) 20 MG tablet Take 1 tablet (20 mg total) by mouth every 8 (eight) hours as needed for spasms. 12/03/20   Petrucelli, Pleas Koch, PA-C  etodolac (LODINE) 300 MG capsule Take 1 capsule (300 mg total) by mouth every 8 (eight) hours. 11/15/20   Linwood Dibbles, MD  hydrOXYzine (ATARAX/VISTARIL) 25 MG tablet Take 25 mg by mouth 3 (three) times daily as needed for anxiety.  04/30/20   [provider]  ibuprofen (ADVIL) 800 MG tablet Take 1 tablet (800 mg total) by mouth every 8 (eight) hours as needed. Patient taking differently: Take 800 mg by mouth every 8 (eight) hours as needed for mild pain. 09/20/20   Placido Sou, PA-C  methocarbamol (ROBAXIN) 500 MG tablet Take 500 mg by mouth daily as needed for muscle spasms. 10/05/20   [provider]  MOBIC 15 MG tablet Take 15 mg by mouth daily as needed for pain. 10/05/20   [provider]  ondansetron (ZOFRAN ODT) 4 MG disintegrating tablet Take 1 tablet (4 mg total) by mouth every 8 (eight) hours as needed for nausea or vomiting. 12/03/20   Petrucelli, Lelon Mast  R, PA-C  oxyCODONE-acetaminophen (PERCOCET/ROXICET) 5-325 MG tablet Take 1 tablet by mouth every 8 (eight) hours as needed for severe pain. 11/01/20   Placido Sou, PA-C  pantoprazole (PROTONIX) 40 MG tablet Take 1 tablet (40 mg total) by mouth 2 (two) times daily. 10/24/20 11/23/20  Dartha Lodge, PA-C  tamsulosin (FLOMAX) 0.4 MG CAPS capsule Take 1 capsule (0.4 mg total) by mouth daily. 10/24/20   Dartha Lodge, PA-C  VRAYLAR 6 MG CAPS Take 6 mg by mouth at bedtime.  01/02/20   [provider]  belladonna-opium (B&O SUPPRETTES) 16.2-30 MG suppository Place 1 suppository rectally every 8 (eight) hours as needed for pain. Patient not taking: Reported on 05/27/2020 05/20/20 08/18/20   Gilda Crease, MD  lansoprazole (PREVACID) 30 MG capsule Take 1 capsule daily while taking ibuprofen. Patient not taking: Reported on 05/27/2020 04/16/20 08/18/20  Molpus, Jonny Ruiz, MD    Allergies    Morphine and related and Almond (diagnostic)  Review of Systems   Review of Systems  All other systems reviewed and are negative.   Physical Exam Updated Vital Signs BP 123/68   Pulse 91   Temp 98.2 F (36.8 C) (Oral)   Resp 18   Ht 5\' 4"  (1.626 m)   Wt 104.3 kg   LMP 04/04/2020   SpO2 100%   BMI 39.48 kg/m   Physical Exam Vitals and nursing note reviewed.   40 year old female, resting comfortably and in no acute distress. Vital signs are normal. Oxygen saturation is 100%, which is normal. Head is normocephalic and atraumatic. PERRLA, EOMI. Oropharynx is clear. Neck is nontender and supple without adenopathy or JVD. Back is nontender and there is no CVA tenderness. Lungs are clear without rales, wheezes, or rhonchi. Chest is nontender. Heart has regular rate and rhythm without murmur. Abdomen is soft, flat, nontender without masses or hepatosplenomegaly and peristalsis is normoactive. Extremities: No swelling or deformity noted.  There is moderate tenderness to palpation over the lateral aspect of the left hip, moderate to severe tenderness to palpation over the left pubic ramus.  There is no pain with internal and external rotation, but moderate pain with passive flexion.  No other tenderness elicited.  Full passive range of motion of all other joints without pain. Skin is warm and dry without rash. Neurologic: Mental status is normal, cranial nerves are intact, there are no motor or sensory deficits.  ED Results / Procedures / Treatments    Radiology DG Hip Unilat W or Wo Pelvis 2-3 Views Left  Result Date: 12/22/2020 CLINICAL DATA:  Left groin pain after a fall. EXAM: DG HIP (WITH OR WITHOUT PELVIS) 2-3V LEFT COMPARISON:  Abdominal radiographs 12/03/2020 FINDINGS: The  pelvis and left hip appear intact. No evidence of acute fracture or dislocation. No focal bone lesion or bone destruction. SI joints and symphysis pubis are not displaced. Soft tissues are unremarkable. IMPRESSION: Negative. Electronically Signed   By: 12/05/2020 M.D.   On: 12/22/2020 03:51    Procedures Procedures   Medications Ordered in ED Medications  acetaminophen (TYLENOL) tablet 650 mg (has no administration in time range)    ED Course  I have reviewed the triage vital signs and the nursing notes.  Pertinent labs & imaging results that were available during my care of the patient were reviewed by me and considered in my medical decision making (see chart for details).  MDM Rules/Calculators/A&P Fall with injury to left hip and pelvis.  X-rays have been ordered.  Old records are reviewed, and she has no relevant past visits.  X-rays are negative for fracture.  Patient is advised on applying ice and using over-the-counter analgesics as needed for pain.  I did offer crutches to use as needed, patient declines.  Follow-up with PCP or with sports medicine as needed.  Final Clinical Impression(s) / ED Diagnoses Final diagnoses:  Fall down steps, initial encounter  Contusion of left hip, initial encounter    Rx / DC Orders ED Discharge Orders    None       Dione Booze, MD 12/22/20 0410

## 2020-12-22 NOTE — ED Triage Notes (Signed)
Pt states she fell tonight and c/o left groin pain.

## 2020-12-22 NOTE — Discharge Instructions (Addendum)
Apply ice for 30 minutes at a time, 4 times a day.  Take acetaminophen as needed for pain.  If pain is not improving over the next 1-2 weeks, consider having x-rays repeated.

## 2020-12-28 ENCOUNTER — Ambulatory Visit: Payer: Medicaid Other | Admitting: Family Medicine

## 2020-12-28 NOTE — Progress Notes (Deleted)
  Katie Spencer - 40 y.o. female MRN [id number]  Date of birth: May 10, 1981  SUBJECTIVE:  Including CC & ROS.  No chief complaint on file.   Katie Spencer is a 40 y.o. female that is  ***.  ***   Review of Systems See HPI   HISTORY: Past Medical, Surgical, Social, and Family History Reviewed & Updated per EMR.   Pertinent Historical Findings include:  Past Medical History:  Diagnosis Date  . Abnormal vaginal bleeding   . Chronic abdominal pain   . Renal disorder     Past Surgical History:  Procedure Laterality Date  . ABDOMINAL HYSTERECTOMY    . CESAREAN SECTION  2006, 2009, 2013, 2014  . CHOLECYSTECTOMY      No family history on file.  Social History   Socioeconomic History  . Marital status: Married    Spouse name: Not on file  . Number of children: Not on file  . Years of education: Not on file  . Highest education level: Not on file  Occupational History  . Not on file  Tobacco Use  . Smoking status: Current Every Day Smoker    Packs/day: 1.00    Types: Cigarettes  . Smokeless tobacco: Never Used  Vaping Use  . Vaping Use: Never used  Substance and Sexual Activity  . Alcohol use: Not Currently  . Drug use: Never  . Sexual activity: Not on file  Other Topics Concern  . Not on file  Social History Narrative  . Not on file   Social Determinants of Health   Financial Resource Strain: Not on file  Food Insecurity: Not on file  Transportation Needs: Not on file  Physical Activity: Not on file  Stress: Not on file  Social Connections: Not on file  Intimate Partner Violence: Not on file     PHYSICAL EXAM:  VS: LMP 04/04/2020  Physical Exam Gen: NAD, alert, cooperative with exam, well-appearing MSK:  ***      ASSESSMENT & PLAN:   No problem-specific Assessment & Plan notes found for this encounter.

## 2020-12-29 ENCOUNTER — Other Ambulatory Visit: Payer: Self-pay

## 2020-12-29 ENCOUNTER — Emergency Department (HOSPITAL_COMMUNITY)
Admission: EM | Admit: 2020-12-29 | Discharge: 2020-12-30 | Disposition: A | Discharge status: Home / Self Care | Payer: Medicaid Other | Attending: Emergency Medicine | Admitting: Emergency Medicine

## 2020-12-29 ENCOUNTER — Encounter (HOSPITAL_COMMUNITY): Payer: Self-pay | Admitting: Emergency Medicine

## 2020-12-29 DIAGNOSIS — F17210 Nicotine dependence, cigarettes, uncomplicated: Secondary | ICD-10-CM | POA: Insufficient documentation

## 2020-12-29 DIAGNOSIS — N23 Unspecified renal colic: Secondary | ICD-10-CM | POA: Insufficient documentation

## 2020-12-29 DIAGNOSIS — R109 Unspecified abdominal pain: Secondary | ICD-10-CM | POA: Diagnosis present

## 2020-12-29 NOTE — ED Triage Notes (Signed)
Pt arrived via POV. Pt states she has pain in her belly button that radiates to her hip. Pt also states that she has pain in her right flank area. Pt states that she may have another kidney stone, but that the pain feels different from her last kidney stone.

## 2020-12-29 NOTE — ED Provider Notes (Signed)
[facility]-EMERGENCY DEPT Provider Note  CSN: [id number] Arrival date & time: 12/29/20 2318  Chief Complaint(s) Abdominal Pain and Flank Pain  HPI Katie Spencer is a 40 y.o. female with a history of kidney stone with recent CT showing a 3 mm right lower pole renal stone here for right-sided flank pain.  Pain fluctuates in intensity.  Described as a dull ache.  No alleviating or aggravating factors.  Patient denies any dysuria.  No nausea or vomiting.  No chest pain or shortness of breath.  No diarrhea.  No other physical complaints.  HPI  Past Medical History Past Medical History:  Diagnosis Date  . Abnormal vaginal bleeding   . Chronic abdominal pain   . Renal disorder    Patient Active Problem List   Diagnosis Date Noted  . Lumbar radiculopathy 10/06/2020  . Wrist sprain, left, subsequent encounter 09/22/2020   Home Medication(s) Prior to Admission medications   Medication Sig Start Date End Date Taking? Authorizing Provider  acetaminophen (TYLENOL) 500 MG tablet Take 1,000 mg by mouth every 6 (six) hours as needed for moderate pain.    [provider]  butalbital-acetaminophen-caffeine (FIORICET) 50-325-40 MG tablet Take 1 tablet by mouth 2 (two) times daily as needed for headache.    [provider]  citalopram (CELEXA) 40 MG tablet Take 40 mg by mouth daily. 01/01/20   [provider]  cyclobenzaprine (FLEXERIL) 10 MG tablet Take 1 tablet (10 mg total) by mouth 3 (three) times daily as needed for muscle spasms. 11/15/20   Linwood Dibbles, MD  diazepam (VALIUM) 5 MG tablet Take 1 tablet (5 mg total) by mouth every 8 (eight) hours as needed for muscle spasms. 09/30/20   Molpus, John, MD  dicyclomine (BENTYL) 20 MG tablet Take 1 tablet (20 mg total) by mouth every 8 (eight) hours as needed for spasms. 12/03/20   Petrucelli, Pleas Koch, PA-C  etodolac (LODINE) 300 MG capsule Take 1 capsule (300 mg total) by mouth every 8 (eight) hours.  11/15/20   Linwood Dibbles, MD  hydrOXYzine (ATARAX/VISTARIL) 25 MG tablet Take 25 mg by mouth 3 (three) times daily as needed for anxiety.  04/30/20   [provider]  ibuprofen (ADVIL) 800 MG tablet Take 1 tablet (800 mg total) by mouth every 8 (eight) hours as needed. Patient taking differently: Take 800 mg by mouth every 8 (eight) hours as needed for mild pain. 09/20/20   Placido Sou, PA-C  methocarbamol (ROBAXIN) 500 MG tablet Take 500 mg by mouth daily as needed for muscle spasms. 10/05/20   [provider]  MOBIC 15 MG tablet Take 15 mg by mouth daily as needed for pain. 10/05/20   [provider]  ondansetron (ZOFRAN ODT) 4 MG disintegrating tablet Take 1 tablet (4 mg total) by mouth every 8 (eight) hours as needed for nausea or vomiting. 12/03/20   Petrucelli, Lelon Mast R, PA-C  oxyCODONE-acetaminophen (PERCOCET/ROXICET) 5-325 MG tablet Take 1 tablet by mouth every 8 (eight) hours as needed for severe pain. 11/01/20   Placido Sou, PA-C  pantoprazole (PROTONIX) 40 MG tablet Take 1 tablet (40 mg total) by mouth 2 (two) times daily. 10/24/20 11/23/20  Dartha Lodge, PA-C  tamsulosin (FLOMAX) 0.4 MG CAPS capsule Take 1 capsule (0.4 mg total) by mouth daily. 10/24/20   Dartha Lodge, PA-C  VRAYLAR 6 MG CAPS Take 6 mg by mouth at bedtime.  01/02/20   [provider]  belladonna-opium (B&O SUPPRETTES) 16.2-30 MG suppository Place 1  suppository rectally every 8 (eight) hours as needed for pain. Patient not taking: Reported on 05/27/2020 05/20/20 08/18/20  Gilda CreasePollina, Christopher J, MD  lansoprazole (PREVACID) 30 MG capsule Take 1 capsule daily while taking ibuprofen. Patient not taking: Reported on 05/27/2020 04/16/20 08/18/20  Molpus, Jonny RuizJohn, MD                                                                                                                                    Past Surgical History Past Surgical History:  Procedure Laterality Date  . ABDOMINAL HYSTERECTOMY    .  CESAREAN SECTION  2006, 2009, 2013, 2014  . CHOLECYSTECTOMY     Family History History reviewed. No pertinent family history.  Social History Social History   Tobacco Use  . Smoking status: Current Every Day Smoker    Packs/day: 1.00    Types: Cigarettes  . Smokeless tobacco: Never Used  Vaping Use  . Vaping Use: Never used  Substance Use Topics  . Alcohol use: Not Currently  . Drug use: Never   Allergies Morphine and related and Almond (diagnostic)  Review of Systems Review of Systems All other systems are reviewed and are negative for acute change except as noted in the HPI  Physical Exam Vital Signs  I have reviewed the triage vital signs BP 103/65   Pulse 72   Temp 98.9 F (37.2 C) (Oral)   Resp 15   Ht 5\' 4"  (1.626 m)   Wt 104.3 kg   LMP 04/04/2020   SpO2 97%   BMI 39.48 kg/m   Physical Exam Vitals reviewed.  Constitutional:      General: She is not in acute distress.    Appearance: She is well-developed. She is not diaphoretic.  HENT:     Head: Normocephalic and atraumatic.     Right Ear: External ear normal.     Left Ear: External ear normal.     Nose: Nose normal.  Eyes:     General: No scleral icterus.    Conjunctiva/sclera: Conjunctivae normal.  Neck:     Trachea: Phonation normal.  Cardiovascular:     Rate and Rhythm: Normal rate and regular rhythm.  Pulmonary:     Effort: Pulmonary effort is normal. No respiratory distress.     Breath sounds: No stridor.  Abdominal:     General: There is no distension.     Tenderness: There is no abdominal tenderness. There is right CVA tenderness. Negative signs include Murphy's sign and Rovsing's sign.  Musculoskeletal:        General: Normal range of motion.     Cervical back: Normal range of motion.  Neurological:     Mental Status: She is alert and oriented to person, place, and time.  Psychiatric:        Behavior: Behavior normal.     ED Results and Treatments Labs (all labs ordered are  listed, but only abnormal results  are displayed) Labs Reviewed  COMPREHENSIVE METABOLIC PANEL - Abnormal; Notable for the following components:      Result Value   Sodium 133 (*)    Glucose, Bld 159 (*)    Calcium 8.8 (*)    AST 45 (*)    ALT 53 (*)    All other components within normal limits  URINALYSIS, ROUTINE W REFLEX MICROSCOPIC - Abnormal; Notable for the following components:   Hgb urine dipstick MODERATE (*)    RBC / HPF >50 (*)    Bacteria, UA RARE (*)    All other components within normal limits  CBC WITH DIFFERENTIAL/PLATELET  LIPASE, BLOOD                                                                                                                         EKG  EKG Interpretation  Date/Time:    Ventricular Rate:    PR Interval:    QRS Duration:   QT Interval:    QTC Calculation:   R Axis:     Text Interpretation:        Radiology No results found.  Pertinent labs & imaging results that were available during my care of the patient were reviewed by me and considered in my medical decision making (see chart for details).  Medications Ordered in ED Medications  sodium chloride 0.9 % bolus 1,000 mL (0 mLs Intravenous Stopped 12/30/20 0346)  alum & mag hydroxide-simeth (MAALOX/MYLANTA) 200-200-20 MG/5ML suspension 30 mL (30 mLs Oral Given 12/30/20 0029)    And  lidocaine (XYLOCAINE) 2 % viscous mouth solution 15 mL (15 mLs Oral Given 12/30/20 0029)  hyoscyamine (LEVSIN SL) SL tablet 0.25 mg (0.25 mg Sublingual Given 12/30/20 0035)  ketorolac (TORADOL) 15 MG/ML injection 15 mg (15 mg Intravenous Given 12/30/20 0029)  tamsulosin (FLOMAX) capsule 0.4 mg (0.4 mg Oral Given 12/30/20 0215)  ketorolac (TORADOL) 15 MG/ML injection 7.5 mg (7.5 mg Intravenous Given 12/30/20 0213)  ketamine 50 mg in normal saline 5 mL (10 mg/mL) syringe (31 mg Intravenous Given 12/30/20 0349)  acetaminophen (TYLENOL) tablet 1,000 mg (1,000 mg Oral Given 12/30/20 0348)  diphenhydrAMINE (BENADRYL)  injection 12.5 mg (12.5 mg Intravenous Given 12/30/20 0409)  ondansetron (ZOFRAN) injection 4 mg (4 mg Intravenous Given 12/30/20 0429)  Procedures Procedures  (including critical care time)  Medical Decision Making / ED Course I have reviewed the nursing notes for this encounter and the patient's prior records (if available in EHR or on provided paperwork).   Katie Spencer was evaluated in Emergency Department on 12/30/2020 for the symptoms described in the history of present illness. She was evaluated in the context of the global COVID-19 pandemic, which necessitated consideration that the patient might be at risk for infection with the SARS-CoV-2 virus that causes COVID-19. Institutional protocols and algorithms that pertain to the evaluation of patients at risk for COVID-19 are in a state of rapid change based on information released by regulatory bodies including the CDC and federal and state organizations. These policies and algorithms were followed during the patient's care in the ED.  Right flank pain. Patient status post hysterectomy.  Unlikely ectopic pregnancy. Most consistent with renal colic. CBC without leukocytosis or anemia. Metabolic panel without significant electrolyte derangements or renal sufficiency. Patient with mildly elevated LFTs but no evidence of biliary obstruction or pancreatitis.  No right upper quadrant pain and patient is status post cholecystectomy.  Unlikely biliary in nature. UA with hematuria but no infection.  Patient treated symptomatically with nausea medicine and IV fluids as well as Toradol.  Toradol initially improved the pain but it returned.  Additional dose of Toradol unsuccessful.  She was given oral Tylenol and ketamine which completely resolve her pain.     Final Clinical Impression(s) / ED Diagnoses Final  diagnoses:  Renal colic on right side    The patient appears reasonably screened and/or stabilized for discharge and I doubt any other medical condition or other [facility] requiring further screening, evaluation, or treatment in the ED at this time prior to discharge. Safe for discharge with strict return precautions.  Disposition: Discharge  Condition: Good  I have discussed the results, Dx and Tx plan with the patient/family who expressed understanding and agree(s) with the plan. Discharge instructions discussed at length. The patient/family was given strict return precautions who verbalized understanding of the instructions. No further questions at time of discharge.    ED Discharge Orders    None        Follow Up: Loyal Jacobson, MD [address] [place] [address] [phone]  Call  as needed    This chart was dictated using voice recognition software.  Despite best efforts to proofread,  errors can occur which can change the documentation meaning.   Nira Conn, MD 12/30/20 [phone]

## 2020-12-30 LAB — URINALYSIS, ROUTINE W REFLEX MICROSCOPIC
Bilirubin Urine: NEGATIVE
Glucose, UA: NEGATIVE mg/dL
Ketones, ur: NEGATIVE mg/dL
Leukocytes,Ua: NEGATIVE
Nitrite: NEGATIVE
Protein, ur: NEGATIVE mg/dL
RBC / HPF: >50 RBC/hpf — ABNORMAL HIGH (ref 0–5)
Specific Gravity, Urine: 1.009 (ref 1.005–1.030)
pH: 7 (ref 5.0–8.0)

## 2020-12-30 LAB — COMPREHENSIVE METABOLIC PANEL
ALT: 53 U/L — ABNORMAL HIGH (ref 0–44)
AST: 45 U/L — ABNORMAL HIGH (ref 15–41)
Albumin: 4.1 g/dL (ref 3.5–5.0)
Alkaline Phosphatase: 82 U/L (ref 38–126)
Anion gap: 8 (ref 5–15)
BUN: 13 mg/dL (ref 6–20)
CO2: 22 mmol/L (ref 22–32)
Calcium: 8.8 mg/dL — ABNORMAL LOW (ref 8.9–10.3)
Chloride: 103 mmol/L (ref 98–111)
Creatinine, Ser: 0.74 mg/dL (ref 0.44–1.00)
GFR, Estimated: >60 mL/min (ref >60)
Glucose, Bld: 159 mg/dL — ABNORMAL HIGH (ref 70–99)
Potassium: 3.5 mmol/L (ref 3.5–5.1)
Sodium: 133 mmol/L — ABNORMAL LOW (ref 135–145)
Total Bilirubin: 0.8 mg/dL (ref 0.3–1.2)
Total Protein: 7.3 g/dL (ref 6.5–8.1)

## 2020-12-30 LAB — CBC WITH DIFFERENTIAL/PLATELET
Abs Immature Granulocytes: 0.04 10*3/uL (ref 0.00–0.07)
Basophils Absolute: 0.1 10*3/uL (ref 0.0–0.1)
Basophils Relative: 1 %
Eosinophils Absolute: 0.3 10*3/uL (ref 0.0–0.5)
Eosinophils Relative: 2 %
HCT: 36.2 % (ref 36.0–46.0)
Hemoglobin: 12.8 g/dL (ref 12.0–15.0)
Immature Granulocytes: 0 %
Lymphocytes Relative: 31 %
Lymphs Abs: 3.2 10*3/uL (ref 0.7–4.0)
MCH: 32.1 pg (ref 26.0–34.0)
MCHC: 35.4 g/dL (ref 30.0–36.0)
MCV: 90.7 fL (ref 80.0–100.0)
Monocytes Absolute: 0.7 10*3/uL (ref 0.1–1.0)
Monocytes Relative: 7 %
Neutro Abs: 6.2 10*3/uL (ref 1.7–7.7)
Neutrophils Relative %: 59 %
Platelets: 224 10*3/uL (ref 150–400)
RBC: 3.99 MIL/uL (ref 3.87–5.11)
RDW: 11.6 % (ref 11.5–15.5)
WBC: 10.5 10*3/uL (ref 4.0–10.5)
nRBC: 0 % (ref 0.0–0.2)

## 2020-12-30 LAB — LIPASE, BLOOD: Lipase: 36 U/L (ref 11–51)

## 2020-12-30 MED ORDER — ACETAMINOPHEN 500 MG PO TABS
1000.0000 mg | ORAL_TABLET | Freq: Once | ORAL | Status: AC
  Administered 2020-12-30: 1000 mg via ORAL
  Filled 2020-12-30: qty 2

## 2020-12-30 MED ORDER — KETOROLAC TROMETHAMINE 15 MG/ML IJ SOLN
7.5000 mg | Freq: Once | INTRAMUSCULAR | Status: AC
  Administered 2020-12-30: 7.5 mg via INTRAVENOUS
  Filled 2020-12-30: qty 1

## 2020-12-30 MED ORDER — ONDANSETRON HCL 4 MG/2ML IJ SOLN
4.0000 mg | Freq: Once | INTRAMUSCULAR | Status: AC
  Administered 2020-12-30: 4 mg via INTRAVENOUS
  Filled 2020-12-30: qty 2

## 2020-12-30 MED ORDER — KETOROLAC TROMETHAMINE 15 MG/ML IJ SOLN
15.0000 mg | Freq: Once | INTRAMUSCULAR | Status: AC
  Administered 2020-12-30: 15 mg via INTRAVENOUS
  Filled 2020-12-30: qty 1

## 2020-12-30 MED ORDER — DIPHENHYDRAMINE HCL 50 MG/ML IJ SOLN
12.5000 mg | Freq: Once | INTRAMUSCULAR | Status: AC
  Administered 2020-12-30: 12.5 mg via INTRAVENOUS
  Filled 2020-12-30: qty 1

## 2020-12-30 MED ORDER — KETAMINE HCL 50 MG/5ML IJ SOSY
0.3000 mg/kg | PREFILLED_SYRINGE | Freq: Once | INTRAMUSCULAR | Status: AC
  Administered 2020-12-30: 31 mg via INTRAVENOUS
  Filled 2020-12-30: qty 5

## 2020-12-30 MED ORDER — HYOSCYAMINE SULFATE 0.125 MG SL SUBL
0.2500 mg | SUBLINGUAL_TABLET | Freq: Once | SUBLINGUAL | Status: AC
  Administered 2020-12-30: 0.25 mg via SUBLINGUAL
  Filled 2020-12-30: qty 2

## 2020-12-30 MED ORDER — SODIUM CHLORIDE 0.9 % IV BOLUS
1000.0000 mL | Freq: Once | INTRAVENOUS | Status: AC
  Administered 2020-12-30: 1000 mL via INTRAVENOUS

## 2020-12-30 MED ORDER — LIDOCAINE VISCOUS HCL 2 % MT SOLN
15.0000 mL | Freq: Once | OROMUCOSAL | Status: AC
  Administered 2020-12-30: 15 mL via ORAL
  Filled 2020-12-30: qty 15

## 2020-12-30 MED ORDER — TAMSULOSIN HCL 0.4 MG PO CAPS
0.4000 mg | ORAL_CAPSULE | Freq: Once | ORAL | Status: AC
  Administered 2020-12-30: 0.4 mg via ORAL
  Filled 2020-12-30: qty 1

## 2020-12-30 MED ORDER — ALUM & MAG HYDROXIDE-SIMETH 200-200-20 MG/5ML PO SUSP
30.0000 mL | Freq: Once | ORAL | Status: AC
  Administered 2020-12-30: 30 mL via ORAL
  Filled 2020-12-30: qty 30

## 2020-12-30 NOTE — ED Provider Notes (Incomplete)
[facility]-EMERGENCY DEPT Provider Note  CSN: [id number] Arrival date & time: 12/29/20 2318  Chief Complaint(s) Abdominal Pain and Flank Pain  HPI Katie Spencer is a 40 y.o. female ***  HPI  Past Medical History Past Medical History:  Diagnosis Date  . Abnormal vaginal bleeding   . Chronic abdominal pain   . Renal disorder    Patient Active Problem List   Diagnosis Date Noted  . Lumbar radiculopathy 10/06/2020  . Wrist sprain, left, subsequent encounter 09/22/2020   Home Medication(s) Prior to Admission medications   Medication Sig Start Date End Date Taking? Authorizing Provider  acetaminophen (TYLENOL) 500 MG tablet Take 1,000 mg by mouth every 6 (six) hours as needed for moderate pain.    [provider]  butalbital-acetaminophen-caffeine (FIORICET) 50-325-40 MG tablet Take 1 tablet by mouth 2 (two) times daily as needed for headache.    [provider]  citalopram (CELEXA) 40 MG tablet Take 40 mg by mouth daily. 01/01/20   [provider]  cyclobenzaprine (FLEXERIL) 10 MG tablet Take 1 tablet (10 mg total) by mouth 3 (three) times daily as needed for muscle spasms. 11/15/20   Linwood Dibbles, MD  diazepam (VALIUM) 5 MG tablet Take 1 tablet (5 mg total) by mouth every 8 (eight) hours as needed for muscle spasms. 09/30/20   Molpus, John, MD  dicyclomine (BENTYL) 20 MG tablet Take 1 tablet (20 mg total) by mouth every 8 (eight) hours as needed for spasms. 12/03/20   Petrucelli, Pleas Koch, PA-C  etodolac (LODINE) 300 MG capsule Take 1 capsule (300 mg total) by mouth every 8 (eight) hours. 11/15/20   Linwood Dibbles, MD  hydrOXYzine (ATARAX/VISTARIL) 25 MG tablet Take 25 mg by mouth 3 (three) times daily as needed for anxiety.  04/30/20   [provider]  ibuprofen (ADVIL) 800 MG tablet Take 1 tablet (800 mg total) by mouth every 8 (eight) hours as needed. Patient taking differently: Take 800 mg by mouth every 8 (eight) hours as needed  for mild pain. 09/20/20   Placido Sou, PA-C  methocarbamol (ROBAXIN) 500 MG tablet Take 500 mg by mouth daily as needed for muscle spasms. 10/05/20   [provider]  MOBIC 15 MG tablet Take 15 mg by mouth daily as needed for pain. 10/05/20   [provider]  ondansetron (ZOFRAN ODT) 4 MG disintegrating tablet Take 1 tablet (4 mg total) by mouth every 8 (eight) hours as needed for nausea or vomiting. 12/03/20   Petrucelli, Lelon Mast R, PA-C  oxyCODONE-acetaminophen (PERCOCET/ROXICET) 5-325 MG tablet Take 1 tablet by mouth every 8 (eight) hours as needed for severe pain. 11/01/20   Placido Sou, PA-C  pantoprazole (PROTONIX) 40 MG tablet Take 1 tablet (40 mg total) by mouth 2 (two) times daily. 10/24/20 11/23/20  Dartha Lodge, PA-C  tamsulosin (FLOMAX) 0.4 MG CAPS capsule Take 1 capsule (0.4 mg total) by mouth daily. 10/24/20   Dartha Lodge, PA-C  VRAYLAR 6 MG CAPS Take 6 mg by mouth at bedtime.  01/02/20   [provider]  belladonna-opium (B&O SUPPRETTES) 16.2-30 MG suppository Place 1 suppository rectally every 8 (eight) hours as needed for pain. Patient not taking: Reported on 05/27/2020 05/20/20 08/18/20  Gilda Crease, MD  lansoprazole (PREVACID) 30 MG capsule Take 1 capsule daily while taking ibuprofen. Patient not taking: Reported on 05/27/2020 04/16/20 08/18/20  Molpus, Jonny Ruiz, MD  Past Surgical History Past Surgical History:  Procedure Laterality Date  . ABDOMINAL HYSTERECTOMY    . CESAREAN SECTION  2006, 2009, 2013, 2014  . CHOLECYSTECTOMY     Family History History reviewed. No pertinent family history.  Social History Social History   Tobacco Use  . Smoking status: Current Every Day Smoker    Packs/day: 1.00    Types: Cigarettes  . Smokeless tobacco: Never Used  Vaping Use  . Vaping Use: Never used  Substance Use Topics   . Alcohol use: Not Currently  . Drug use: Never   Allergies Morphine and related and Almond (diagnostic)  Review of Systems Review of Systems *** Physical Exam Vital Signs  I have reviewed the triage vital signs BP (!) 159/84   Pulse 92   Temp 98.9 F (37.2 C) (Oral)   Resp 18   Ht 5\' 4"  (1.626 m)   Wt 104.3 kg   LMP 04/04/2020   SpO2 100%   BMI 39.48 kg/m  *** Physical Exam  ED Results and Treatments Labs (all labs ordered are listed, but only abnormal results are displayed) Labs Reviewed - No data to display                                                                                                                       EKG  EKG Interpretation  Date/Time:    Ventricular Rate:    PR Interval:    QRS Duration:   QT Interval:    QTC Calculation:   R Axis:     Text Interpretation:        Radiology No results found.  Pertinent labs & imaging results that were available during my care of the patient were reviewed by me and considered in my medical decision making (see chart for details).  Medications Ordered in ED Medications - No data to display                                                                                                                                  Procedures Procedures  (including critical care time)  Medical Decision Making / ED Course I have reviewed the nursing notes for this encounter and the patient's prior records (if available in EHR or on provided paperwork).   Katie Spencer was evaluated in Emergency Department on 12/30/2020 for the symptoms described in the history of present illness. She was evaluated in the  context of the global COVID-19 pandemic, which necessitated consideration that the patient might be at risk for infection with the SARS-CoV-2 virus that causes COVID-19. Institutional protocols and algorithms that pertain to the evaluation of patients at risk for COVID-19 are in a state of rapid change based on  information released by regulatory bodies including the CDC and federal and state organizations. These policies and algorithms were followed during the patient's care in the ED.  ***      Final Clinical Impression(s) / ED Diagnoses Final diagnoses:  None      This chart was dictated using voice recognition software.  Despite best efforts to proofread,  errors can occur which can change the documentation meaning.

## 2021-01-06 ENCOUNTER — Emergency Department (HOSPITAL_COMMUNITY)
Admission: EM | Admit: 2021-01-06 | Discharge: 2021-01-06 | Disposition: A | Discharge status: Home / Self Care | Payer: Medicaid Other | Attending: Emergency Medicine | Admitting: Emergency Medicine

## 2021-01-06 ENCOUNTER — Emergency Department (HOSPITAL_COMMUNITY): Payer: Medicaid Other

## 2021-01-06 ENCOUNTER — Encounter (HOSPITAL_COMMUNITY): Payer: Self-pay

## 2021-01-06 ENCOUNTER — Other Ambulatory Visit: Payer: Self-pay

## 2021-01-06 DIAGNOSIS — R102 Pelvic and perineal pain: Secondary | ICD-10-CM

## 2021-01-06 DIAGNOSIS — R300 Dysuria: Secondary | ICD-10-CM | POA: Insufficient documentation

## 2021-01-06 DIAGNOSIS — N83202 Unspecified ovarian cyst, left side: Secondary | ICD-10-CM | POA: Insufficient documentation

## 2021-01-06 DIAGNOSIS — F17210 Nicotine dependence, cigarettes, uncomplicated: Secondary | ICD-10-CM | POA: Diagnosis not present

## 2021-01-06 LAB — URINALYSIS, ROUTINE W REFLEX MICROSCOPIC
Bilirubin Urine: NEGATIVE
Glucose, UA: >=500 mg/dL — AB
Hgb urine dipstick: NEGATIVE
Ketones, ur: NEGATIVE mg/dL
Leukocytes,Ua: NEGATIVE
Nitrite: NEGATIVE
Protein, ur: NEGATIVE mg/dL
Specific Gravity, Urine: 1.021 (ref 1.005–1.030)
pH: 6 (ref 5.0–8.0)

## 2021-01-06 NOTE — Discharge Instructions (Signed)
Please call your OBGYN for follow-up on the cyst seen on ultrasound today.

## 2021-01-06 NOTE — ED Triage Notes (Signed)
Pt complains of right lower abdominal pain possibly an ovarian cyst, she has a hx and the pain feels the same

## 2021-01-06 NOTE — ED Provider Notes (Signed)
[facility]-EMERGENCY DEPT Provider Note   CSN: [id number] Arrival date & time: 01/06/21  0257     History Chief Complaint  Patient presents with  . Pelvic Pain    Tikisha Molinaro is a 40 y.o. female.  The history is provided by the patient and medical records.    40 y.o. F with hx of ovarian cysts, chronic abdominal pain, kidney stones, presenting to the ED for right sided pelvic pain.  She reports this has been ongoing for a few days now.  She states it feels like prior ovarian cysts.  She reports some dysuria with this.  No hematuria or abnormal vaginal bleeding/discharge.  No fever, chills, sweats.  No vomiting or diarrhea.  She took naproxen and soaked in epsom salt bath PTA.  She is s/p hysterectomy.  Past Medical History:  Diagnosis Date  . Abnormal vaginal bleeding   . Chronic abdominal pain   . Renal disorder     Patient Active Problem List   Diagnosis Date Noted  . Lumbar radiculopathy 10/06/2020  . Wrist sprain, left, subsequent encounter 09/22/2020    Past Surgical History:  Procedure Laterality Date  . ABDOMINAL HYSTERECTOMY    . CESAREAN SECTION  2006, 2009, 2013, 2014  . CHOLECYSTECTOMY       OB History   No obstetric history on file.     History reviewed. No pertinent family history.  Social History   Tobacco Use  . Smoking status: Current Every Day Smoker    Packs/day: 1.00    Types: Cigarettes  . Smokeless tobacco: Never Used  Vaping Use  . Vaping Use: Never used  Substance Use Topics  . Alcohol use: Not Currently  . Drug use: Never    Home Medications Prior to Admission medications   Medication Sig Start Date End Date Taking? Authorizing Provider  acetaminophen (TYLENOL) 500 MG tablet Take 1,000 mg by mouth every 6 (six) hours as needed for moderate pain.    [provider]  butalbital-acetaminophen-caffeine (FIORICET) 50-325-40 MG tablet Take 1 tablet by mouth 2 (two) times daily as needed for  headache.    [provider]  citalopram (CELEXA) 40 MG tablet Take 40 mg by mouth daily. 01/01/20   [provider]  cyclobenzaprine (FLEXERIL) 10 MG tablet Take 1 tablet (10 mg total) by mouth 3 (three) times daily as needed for muscle spasms. 11/15/20   Linwood Dibbles, MD  diazepam (VALIUM) 5 MG tablet Take 1 tablet (5 mg total) by mouth every 8 (eight) hours as needed for muscle spasms. 09/30/20   Molpus, John, MD  dicyclomine (BENTYL) 20 MG tablet Take 1 tablet (20 mg total) by mouth every 8 (eight) hours as needed for spasms. 12/03/20   Petrucelli, Pleas Koch, PA-C  etodolac (LODINE) 300 MG capsule Take 1 capsule (300 mg total) by mouth every 8 (eight) hours. 11/15/20   Linwood Dibbles, MD  hydrOXYzine (ATARAX/VISTARIL) 25 MG tablet Take 25 mg by mouth 3 (three) times daily as needed for anxiety.  04/30/20   [provider]  ibuprofen (ADVIL) 800 MG tablet Take 1 tablet (800 mg total) by mouth every 8 (eight) hours as needed. Patient taking differently: Take 800 mg by mouth every 8 (eight) hours as needed for mild pain. 09/20/20   Placido Sou, PA-C  methocarbamol (ROBAXIN) 500 MG tablet Take 500 mg by mouth daily as needed for muscle spasms. 10/05/20   [provider]  MOBIC 15 MG tablet Take 15 mg by mouth  daily as needed for pain. 10/05/20   [provider]  ondansetron (ZOFRAN ODT) 4 MG disintegrating tablet Take 1 tablet (4 mg total) by mouth every 8 (eight) hours as needed for nausea or vomiting. 12/03/20   Petrucelli, Lelon Mast R, PA-C  oxyCODONE-acetaminophen (PERCOCET/ROXICET) 5-325 MG tablet Take 1 tablet by mouth every 8 (eight) hours as needed for severe pain. 11/01/20   Placido Sou, PA-C  pantoprazole (PROTONIX) 40 MG tablet Take 1 tablet (40 mg total) by mouth 2 (two) times daily. 10/24/20 11/23/20  Dartha Lodge, PA-C  tamsulosin (FLOMAX) 0.4 MG CAPS capsule Take 1 capsule (0.4 mg total) by mouth daily. 10/24/20   Dartha Lodge, PA-C  VRAYLAR 6 MG CAPS  Take 6 mg by mouth at bedtime.  01/02/20   [provider]  belladonna-opium (B&O SUPPRETTES) 16.2-30 MG suppository Place 1 suppository rectally every 8 (eight) hours as needed for pain. Patient not taking: Reported on 05/27/2020 05/20/20 08/18/20  Gilda Crease, MD  lansoprazole (PREVACID) 30 MG capsule Take 1 capsule daily while taking ibuprofen. Patient not taking: Reported on 05/27/2020 04/16/20 08/18/20  Molpus, Jonny Ruiz, MD    Allergies    Morphine and related and Almond (diagnostic)  Review of Systems   Review of Systems  Genitourinary: Positive for dysuria and pelvic pain.  All other systems reviewed and are negative.   Physical Exam Updated Vital Signs BP (!) 140/100 (BP Location: Left Arm)   Pulse 96   Temp 98.6 F (37 C) (Oral)   Resp 18   Ht 5\' 4"  (1.626 m)   Wt 106.1 kg   LMP 04/04/2020   SpO2 100%   BMI 40.17 kg/m   Physical Exam Vitals and nursing note reviewed.  Constitutional:      Appearance: She is well-developed.     Comments: Comfortable in appearance, lying on right side  HENT:     Head: Normocephalic and atraumatic.  Eyes:     Conjunctiva/sclera: Conjunctivae normal.     Pupils: Pupils are equal, round, and reactive to light.  Cardiovascular:     Rate and Rhythm: Normal rate and regular rhythm.     Heart sounds: Normal heart sounds.  Pulmonary:     Effort: Pulmonary effort is normal.     Breath sounds: Normal breath sounds.  Abdominal:     General: Bowel sounds are normal.     Palpations: Abdomen is soft.     Comments: Points to right inguinal area as location of pain, no tenderness at McBurney's point.  No peritoneal signs  Musculoskeletal:        General: Normal range of motion.     Cervical back: Normal range of motion.  Skin:    General: Skin is warm and dry.  Neurological:     Mental Status: She is alert and oriented to person, place, and time.     ED Results / Procedures / Treatments   Labs (all labs ordered are listed,  but only abnormal results are displayed) Labs Reviewed  URINALYSIS, ROUTINE W REFLEX MICROSCOPIC - Abnormal; Notable for the following components:      Result Value   APPearance CLOUDY (*)    Glucose, UA >=500 (*)    Bacteria, UA RARE (*)    All other components within normal limits    EKG None  Radiology 04/06/2020 PELVIC COMPLETE W TRANSVAGINAL AND TORSION R/O  Result Date: 01/06/2021 CLINICAL DATA:  Right pelvic pain, history of cysts, prior hysterectomy EXAM: TRANSABDOMINAL AND TRANSVAGINAL ULTRASOUND  OF PELVIS DOPPLER ULTRASOUND OF OVARIES TECHNIQUE: Both transabdominal and transvaginal ultrasound examinations of the pelvis were performed. Transabdominal technique was performed for global imaging of the pelvis including uterus, ovaries, adnexal regions, and pelvic cul-de-sac. It was necessary to proceed with endovaginal exam following the transabdominal exam to visualize the ovaries and adnexal structures. Color and duplex Doppler ultrasound was utilized to evaluate blood flow to the ovaries. COMPARISON:  Ultrasound 12/03/2020, CT 11/15/2020 FINDINGS: Uterus Surgically absent. No acute or worrisome soft tissue abnormalities seen along the level of the vaginal cuff. Endometrium Surgically absent. Right ovary Visualized only on transabdominal imaging. Measurements: 3.5 x 1.8 x 2.1 cm = volume: 7.2 mL. Normal appearance/no adnexal mass. Left ovary Measurements: 3.3 x 2.7 x 2.3 cm = volume: 10.6 mL. Normal follicles. Slightly more complicated, thick-walled mixed solid and cystic structure in the left ovary with peripheral color flow, has an appearance most suggestive of a benign corpus luteum measuring 2.6 x 2.3 x 1.8 cm in size. Pulsed Doppler evaluation of both ovaries demonstrates normal low-resistance arterial and venous waveforms. Other findings Trace anechoic free fluid in the deep pelvis, nonspecific though can be physiologic in a reproductive age female. IMPRESSION: 1. Probable benign corpus luteum  in the left ovary. Given symptomatology, could consider 6-12 week follow-up to assess for resolution. 2. Trace anechoic free fluid in the pelvis, nonspecific though can be physiologic in a reproductive age female. 3. Prior hysterectomy. Electronically Signed   By: Kreg Shropshire M.D.   On: 01/06/2021 04:39    Procedures Procedures   Medications Ordered in ED Medications - No data to display  ED Course  I have reviewed the triage vital signs and the nursing notes.  Pertinent labs & imaging results that were available during my care of the patient were reviewed by me and considered in my medical decision making (see chart for details).    MDM Rules/Calculators/A&P  40 year old female presenting to the ED with right-sided pelvic pain.  States it feels like prior ovarian cyst.  She has had symptoms for several days now, somewhat worsening tonight.  She is afebrile and nontoxic.  Points to her right pelvis as area of pain.  She has no abdominal tenderness, specifically none at McBurney's point.  She has not had any nausea, vomiting, fever, or other infectious symptoms.  Also complains of some dysuria.  UA sent.  Will obtain pelvic ultrasound.  She is in monogamous relationship with husband, has had STD testing in the past 30 days that was negative.  She has no complaint of discharge or concern for STD at this time.  UA without signs of infection.  Ultrasound does have ovarian cyst on left, none on right which seems a bit atypical but possibly referred pain. She continues to appear well here.  Her abdomen remains soft and benign.  Patient has had multiple CTs over the past few months for similar related complaints.  My clinical suspicion for appendicitis is low at this time so I do not think she needs repeat CT today.  Patient is agreeable.  She will call her OB/GYN in the morning to arrange follow-up.  Return here for new concerns.  Final Clinical Impression(s) / ED Diagnoses Final diagnoses:  Pelvic  pain    Rx / DC Orders ED Discharge Orders    None       Garlon Hatchet, PA-C 01/06/21 0505    Mesner, Barbara Cower, MD 01/06/21 [phone]

## 2021-01-09 ENCOUNTER — Other Ambulatory Visit: Payer: Self-pay

## 2021-01-09 ENCOUNTER — Emergency Department (HOSPITAL_BASED_OUTPATIENT_CLINIC_OR_DEPARTMENT_OTHER): Payer: Medicaid Other

## 2021-01-09 ENCOUNTER — Emergency Department (HOSPITAL_BASED_OUTPATIENT_CLINIC_OR_DEPARTMENT_OTHER)
Admission: EM | Admit: 2021-01-09 | Discharge: 2021-01-10 | Disposition: A | Discharge status: Home / Self Care | Payer: Medicaid Other | Attending: Emergency Medicine | Admitting: Emergency Medicine

## 2021-01-09 ENCOUNTER — Encounter (HOSPITAL_BASED_OUTPATIENT_CLINIC_OR_DEPARTMENT_OTHER): Payer: Self-pay | Admitting: Emergency Medicine

## 2021-01-09 DIAGNOSIS — M545 Low back pain, unspecified: Secondary | ICD-10-CM | POA: Diagnosis present

## 2021-01-09 DIAGNOSIS — R109 Unspecified abdominal pain: Secondary | ICD-10-CM | POA: Insufficient documentation

## 2021-01-09 DIAGNOSIS — F17210 Nicotine dependence, cigarettes, uncomplicated: Secondary | ICD-10-CM | POA: Insufficient documentation

## 2021-01-09 LAB — CBC WITH DIFFERENTIAL/PLATELET
Abs Immature Granulocytes: 0.03 10*3/uL (ref 0.00–0.07)
Basophils Absolute: 0.1 10*3/uL (ref 0.0–0.1)
Basophils Relative: 1 %
Eosinophils Absolute: 0.3 10*3/uL (ref 0.0–0.5)
Eosinophils Relative: 3 %
HCT: 36.4 % (ref 36.0–46.0)
Hemoglobin: 13.2 g/dL (ref 12.0–15.0)
Immature Granulocytes: 0 %
Lymphocytes Relative: 34 %
Lymphs Abs: 3.1 10*3/uL (ref 0.7–4.0)
MCH: 32.8 pg (ref 26.0–34.0)
MCHC: 36.3 g/dL — ABNORMAL HIGH (ref 30.0–36.0)
MCV: 90.3 fL (ref 80.0–100.0)
Monocytes Absolute: 0.6 10*3/uL (ref 0.1–1.0)
Monocytes Relative: 7 %
Neutro Abs: 5.1 10*3/uL (ref 1.7–7.7)
Neutrophils Relative %: 55 %
Platelets: 196 10*3/uL (ref 150–400)
RBC: 4.03 MIL/uL (ref 3.87–5.11)
RDW: 11.9 % (ref 11.5–15.5)
WBC: 9.2 10*3/uL (ref 4.0–10.5)
nRBC: 0 % (ref 0.0–0.2)

## 2021-01-09 LAB — URINALYSIS, ROUTINE W REFLEX MICROSCOPIC
Bilirubin Urine: NEGATIVE
Glucose, UA: >=500 mg/dL — AB
Ketones, ur: NEGATIVE mg/dL
Leukocytes,Ua: NEGATIVE
Nitrite: NEGATIVE
Protein, ur: NEGATIVE mg/dL
Specific Gravity, Urine: 1.02 (ref 1.005–1.030)
pH: 6 (ref 5.0–8.0)

## 2021-01-09 LAB — URINALYSIS, MICROSCOPIC (REFLEX): RBC / HPF: >50 RBC/hpf (ref 0–5)

## 2021-01-09 MED ORDER — OXYCODONE-ACETAMINOPHEN 5-325 MG PO TABS
1.0000 | ORAL_TABLET | Freq: Once | ORAL | Status: AC
Start: 2021-01-09 — End: 2021-01-09
  Administered 2021-01-09: 1 via ORAL
  Filled 2021-01-09: qty 1

## 2021-01-09 MED ORDER — ONDANSETRON 4 MG PO TBDP
8.0000 mg | ORAL_TABLET | Freq: Once | ORAL | Status: AC
  Administered 2021-01-09: 8 mg via ORAL
  Filled 2021-01-09: qty 2

## 2021-01-09 MED ORDER — KETOROLAC TROMETHAMINE 60 MG/2ML IM SOLN
60.0000 mg | Freq: Once | INTRAMUSCULAR | Status: AC
  Administered 2021-01-09: 60 mg via INTRAMUSCULAR
  Filled 2021-01-09: qty 2

## 2021-01-09 NOTE — ED Triage Notes (Signed)
Reports her low back went out this afternoon while walking.  Also having right flank pain.  Hx of kidney stones.  Also having burning on urination.

## 2021-01-09 NOTE — ED Notes (Signed)
Pt took motrin 800mg  at approx 4pm today and flexeril at approx 6pm today.

## 2021-01-10 ENCOUNTER — Ambulatory Visit (INDEPENDENT_AMBULATORY_CARE_PROVIDER_SITE_OTHER): Payer: Medicaid Other | Admitting: Family Medicine

## 2021-01-10 ENCOUNTER — Encounter: Payer: Self-pay | Admitting: Family Medicine

## 2021-01-10 VITALS — BP 130/70 | Ht 64.0 in | Wt 233.0 lb

## 2021-01-10 DIAGNOSIS — M545 Low back pain, unspecified: Secondary | ICD-10-CM | POA: Diagnosis present

## 2021-01-10 LAB — COMPREHENSIVE METABOLIC PANEL
ALT: 42 U/L (ref 0–44)
AST: 33 U/L (ref 15–41)
Albumin: 3.7 g/dL (ref 3.5–5.0)
Alkaline Phosphatase: 90 U/L (ref 38–126)
Anion gap: 9 (ref 5–15)
BUN: 14 mg/dL (ref 6–20)
CO2: 20 mmol/L — ABNORMAL LOW (ref 22–32)
Calcium: 9.1 mg/dL (ref 8.9–10.3)
Chloride: 105 mmol/L (ref 98–111)
Creatinine, Ser: 0.75 mg/dL (ref 0.44–1.00)
GFR, Estimated: >60 mL/min (ref >60)
Glucose, Bld: 247 mg/dL — ABNORMAL HIGH (ref 70–99)
Potassium: 3.6 mmol/L (ref 3.5–5.1)
Sodium: 134 mmol/L — ABNORMAL LOW (ref 135–145)
Total Bilirubin: 0.4 mg/dL (ref 0.3–1.2)
Total Protein: 6.7 g/dL (ref 6.5–8.1)

## 2021-01-10 NOTE — Progress Notes (Signed)
  Katie Spencer - 40 y.o. female MRN [id number]  Date of birth: [birth year]  SUBJECTIVE:  Including CC & ROS.  No chief complaint on file.   Katie Spencer is a 40 y.o. female that is presenting with acute on chronic low back pain.  Pain is occurring low back with no radicular pain today.  The back brace does help her pain.   Review of Systems See HPI   HISTORY: Past Medical, Surgical, Social, and Family History Reviewed & Updated per EMR.   Pertinent Historical Findings include:  Past Medical History:  Diagnosis Date  . Abnormal vaginal bleeding   . Chronic abdominal pain   . Renal disorder     Past Surgical History:  Procedure Laterality Date  . ABDOMINAL HYSTERECTOMY    . CESAREAN SECTION  2006, 2009, 2013, 2014  . CHOLECYSTECTOMY      History reviewed. No pertinent family history.  Social History   Socioeconomic History  . Marital status: Married    Spouse name: Not on file  . Number of children: Not on file  . Years of education: Not on file  . Highest education level: Not on file  Occupational History  . Not on file  Tobacco Use  . Smoking status: Current Every Day Smoker    Packs/day: 1.00    Types: Cigarettes  . Smokeless tobacco: Never Used  Vaping Use  . Vaping Use: Never used  Substance and Sexual Activity  . Alcohol use: Not Currently  . Drug use: Never  . Sexual activity: Not on file  Other Topics Concern  . Not on file  Social History Narrative  . Not on file   Social Determinants of Health   Financial Resource Strain: Not on file  Food Insecurity: Not on file  Transportation Needs: Not on file  Physical Activity: Not on file  Stress: Not on file  Social Connections: Not on file  Intimate Partner Violence: Not on file     PHYSICAL EXAM:  VS: BP 130/70 (BP Location: Left Arm, Patient Position: Sitting, Cuff Size: Large)   Ht 5\' 4"  (1.626 m)   Wt 233 lb (105.7 kg)   LMP 04/04/2020   BMI 39.99 kg/m  Physical Exam Gen: NAD,  alert, cooperative with exam, well-appearing    ASSESSMENT & PLAN:   Acute bilateral low back pain without sciatica Acute on chronic in nature.  No radicular component today. -Counseled on home exercise therapy and supportive care. -trying to stay away from narcotics as she has been successful with recovery.  -The patient will benefit from home use of - The patient will benefit from the at home use of a Zynex NexWave with the clinically proven, TENS, IFC and NMES modalities to reduce pain, improve function and reduce medication use.  Continued use of the above drug-free treatment options are both reasonable and medically necessary at this time.

## 2021-01-10 NOTE — Patient Instructions (Signed)
Good to see you Please try the unit Please continue the heat   Please send me a message in MyChart with any questions or updates.  Please see me back in 4 weeks or as needed if better.   --Dr. Jordan Likes

## 2021-01-10 NOTE — ED Provider Notes (Signed)
[facility] EMERGENCY DEPARTMENT Provider Note   CSN: [id number] Arrival date & time: 01/09/21  2158     History Chief Complaint  Patient presents with  . Flank Pain  . Back Pain    Katie Spencer is a 40 y.o. female.  41 year old female with multiple ER visits for pain related complaints including recently.  She comes in with flank pain on the right.  She states that she also has some left-sided pain and she also has bilateral lower back pain after her back "went out".  Today she describes this as walking as she felt a sharp pain that shot across her back.  Said bilateral.  She has no neurodeficits or incontinence.   Flank Pain  Back Pain      Past Medical History:  Diagnosis Date  . Abnormal vaginal bleeding   . Chronic abdominal pain   . Renal disorder     Patient Active Problem List   Diagnosis Date Noted  . Lumbar radiculopathy 10/06/2020  . Wrist sprain, left, subsequent encounter 09/22/2020    Past Surgical History:  Procedure Laterality Date  . ABDOMINAL HYSTERECTOMY    . CESAREAN SECTION  2006, 2009, 2013, 2014  . CHOLECYSTECTOMY       OB History   No obstetric history on file.     No family history on file.  Social History   Tobacco Use  . Smoking status: Current Every Day Smoker    Packs/day: 1.00    Types: Cigarettes  . Smokeless tobacco: Never Used  Vaping Use  . Vaping Use: Never used  Substance Use Topics  . Alcohol use: Not Currently  . Drug use: Never    Home Medications Prior to Admission medications   Medication Sig Start Date End Date Taking? Authorizing Provider  acetaminophen (TYLENOL) 500 MG tablet Take 1,000 mg by mouth every 6 (six) hours as needed for moderate pain.    [provider]  butalbital-acetaminophen-caffeine (FIORICET) 50-325-40 MG tablet Take 1 tablet by mouth 2 (two) times daily as needed for headache.    [provider]  citalopram (CELEXA) 40 MG tablet Take 40 mg by mouth  daily. 01/01/20   [provider]  cyclobenzaprine (FLEXERIL) 10 MG tablet Take 1 tablet (10 mg total) by mouth 3 (three) times daily as needed for muscle spasms. 11/15/20   Linwood Dibbles, MD  diazepam (VALIUM) 5 MG tablet Take 1 tablet (5 mg total) by mouth every 8 (eight) hours as needed for muscle spasms. 09/30/20   Molpus, John, MD  dicyclomine (BENTYL) 20 MG tablet Take 1 tablet (20 mg total) by mouth every 8 (eight) hours as needed for spasms. 12/03/20   Petrucelli, Pleas Koch, PA-C  etodolac (LODINE) 300 MG capsule Take 1 capsule (300 mg total) by mouth every 8 (eight) hours. 11/15/20   Linwood Dibbles, MD  hydrOXYzine (ATARAX/VISTARIL) 25 MG tablet Take 25 mg by mouth 3 (three) times daily as needed for anxiety.  04/30/20   [provider]  ibuprofen (ADVIL) 800 MG tablet Take 1 tablet (800 mg total) by mouth every 8 (eight) hours as needed. Patient taking differently: Take 800 mg by mouth every 8 (eight) hours as needed for mild pain. 09/20/20   Placido Sou, PA-C  methocarbamol (ROBAXIN) 500 MG tablet Take 500 mg by mouth daily as needed for muscle spasms. 10/05/20   [provider]  MOBIC 15 MG tablet Take 15 mg by mouth daily as needed for pain. 10/05/20   [provider]  ondansetron (ZOFRAN ODT) 4 MG disintegrating tablet Take 1 tablet (4 mg total) by mouth every 8 (eight) hours as needed for nausea or vomiting. 12/03/20   Petrucelli, Lelon Mast R, PA-C  oxyCODONE-acetaminophen (PERCOCET/ROXICET) 5-325 MG tablet Take 1 tablet by mouth every 8 (eight) hours as needed for severe pain. 11/01/20   Placido Sou, PA-C  pantoprazole (PROTONIX) 40 MG tablet Take 1 tablet (40 mg total) by mouth 2 (two) times daily. 10/24/20 11/23/20  Dartha Lodge, PA-C  tamsulosin (FLOMAX) 0.4 MG CAPS capsule Take 1 capsule (0.4 mg total) by mouth daily. 10/24/20   Dartha Lodge, PA-C  VRAYLAR 6 MG CAPS Take 6 mg by mouth at bedtime.  01/02/20   [provider]  belladonna-opium (B&O  SUPPRETTES) 16.2-30 MG suppository Place 1 suppository rectally every 8 (eight) hours as needed for pain. Patient not taking: Reported on 05/27/2020 05/20/20 08/18/20  Gilda Crease, MD  lansoprazole (PREVACID) 30 MG capsule Take 1 capsule daily while taking ibuprofen. Patient not taking: Reported on 05/27/2020 04/16/20 08/18/20  Molpus, Jonny Ruiz, MD    Allergies    Morphine and related and Almond (diagnostic)  Review of Systems   Review of Systems  Genitourinary: Positive for flank pain.  Musculoskeletal: Positive for back pain.  All other systems reviewed and are negative.   Physical Exam Updated Vital Signs BP (!) 119/57 (BP Location: Right Arm)   Pulse 70   Temp 98.6 F (37 C) (Oral)   Resp 16   Ht 5\' 4"  (1.626 m)   Wt 106.1 kg   LMP 04/04/2020   SpO2 99%   BMI 40.15 kg/m   Physical Exam Vitals and nursing note reviewed.  Constitutional:      Appearance: She is well-developed.  HENT:     Head: Normocephalic and atraumatic.     Mouth/Throat:     Mouth: Mucous membranes are moist.     Pharynx: Oropharynx is clear.  Eyes:     Pupils: Pupils are equal, round, and reactive to light.  Cardiovascular:     Rate and Rhythm: Normal rate and regular rhythm.  Pulmonary:     Effort: No respiratory distress.     Breath sounds: No stridor.  Abdominal:     General: Abdomen is flat. There is no distension.  Musculoskeletal:        General: Tenderness (mild bilateral lumbar paraspinal) present. Normal range of motion.     Cervical back: Normal range of motion.  Skin:    General: Skin is warm and dry.  Neurological:     General: No focal deficit present.     Mental Status: She is alert.     ED Results / Procedures / Treatments   Labs (all labs ordered are listed, but only abnormal results are displayed) Labs Reviewed  URINALYSIS, ROUTINE W REFLEX MICROSCOPIC - Abnormal; Notable for the following components:      Result Value   APPearance CLOUDY (*)    Glucose, UA >=500  (*)    Hgb urine dipstick LARGE (*)    All other components within normal limits  URINALYSIS, MICROSCOPIC (REFLEX) - Abnormal; Notable for the following components:   Bacteria, UA RARE (*)    All other components within normal limits  CBC WITH DIFFERENTIAL/PLATELET - Abnormal; Notable for the following components:   MCHC 36.3 (*)    All other components within normal limits  COMPREHENSIVE METABOLIC PANEL - Abnormal; Notable for the following components:   Sodium 134 (*)  CO2 20 (*)    Glucose, Bld 247 (*)    All other components within normal limits  URINE CULTURE    EKG None  Radiology CT Renal Stone Study  Result Date: 01/09/2021 CLINICAL DATA:  Low back pain and right flank pain with burning on urination. History of kidney stones. EXAM: CT ABDOMEN AND PELVIS WITHOUT CONTRAST TECHNIQUE: Multidetector CT imaging of the abdomen and pelvis was performed following the standard protocol without IV contrast. COMPARISON:  11/15/2020 FINDINGS: Lower chest: The lung bases are clear. Hepatobiliary: Diffuse fatty infiltration of the liver. No focal lesions. Surgical absence of the gallbladder. No bile duct dilatation. Pancreas: Unremarkable. No pancreatic ductal dilatation or surrounding inflammatory changes. Spleen: Normal in size without focal abnormality. Adrenals/Urinary Tract: 3 mm stone in the lower pole right kidney. No hydronephrosis or hydroureter. No ureteral stones are identified. Bladder is normal. Stomach/Bowel: Stomach, small bowel, and colon are not abnormally distended. No wall thickening or inflammatory changes are appreciated. Diverticulosis of the colon without evidence of diverticulitis. Appendix is normal. Vascular/Lymphatic: No significant vascular findings are present. No enlarged abdominal or pelvic lymph nodes. Reproductive: Uterus is surgically absent. Both ovaries are visualized without abnormal mass. Other: No free air or free fluid in the abdomen. Nodular scarring in the  pelvic fat anterior to the bladder and deep to the rectus abdominus muscle. This is unchanged since prior study and likely postoperative. Focal infiltration in the subcutaneous fat over the anterior abdominal wall may represent contusion or scarring. This is also similar to prior study. Musculoskeletal: No acute or significant osseous findings. IMPRESSION: 1. 3 mm nonobstructing stone in the lower pole right kidney. No ureteral stone or obstruction. 2. Diffuse fatty infiltration of the liver. 3. Diverticulosis of the colon without evidence of diverticulitis. 4. Nodular scarring in the pelvic fat anterior to the bladder and deep to the rectus abdominus muscle. This is unchanged since prior study and likely postoperative. 5. Focal infiltration in the subcutaneous fat over the anterior abdominal wall may represent contusion or scarring. This is also unchanged. Electronically Signed   By: Burman Nieves M.D.   On: 01/09/2021 23:46    Procedures Procedures   Medications Ordered in ED Medications  ketorolac (TORADOL) injection 60 mg (60 mg Intramuscular Given 01/09/21 2341)  oxyCODONE-acetaminophen (PERCOCET/ROXICET) 5-325 MG per tablet 1 tablet (1 tablet Oral Given 01/09/21 2341)  ondansetron (ZOFRAN-ODT) disintegrating tablet 8 mg (8 mg Oral Given 01/09/21 2340)    ED Course  I have reviewed the triage vital signs and the nursing notes.  Pertinent labs & imaging results that were available during my care of the patient were reviewed by me and considered in my medical decision making (see chart for details).    MDM Rules/Calculators/A&P                          Workup unremarkable. Pain improved. Has close follow up for almost all of her complaints. Will dc for same.   Final Clinical Impression(s) / ED Diagnoses Final diagnoses:  Left-sided low back pain without sciatica, unspecified chronicity    Rx / DC Orders ED Discharge Orders    None       Praise Stennett, Barbara Cower, MD 01/10/21 [id number]

## 2021-01-10 NOTE — Assessment & Plan Note (Addendum)
Acute on chronic in nature.  No radicular component today. -Counseled on home exercise therapy and supportive care. -trying to stay away from narcotics as she has been successful with recovery.  -The patient will benefit from home use of - The patient will benefit from the at home use of a Zynex NexWave with the clinically proven, TENS, IFC and NMES modalities to reduce pain, improve function and reduce medication use.  Continued use of the above drug-free treatment options are both reasonable and medically necessary at this time.

## 2021-01-11 LAB — URINE CULTURE

## 2021-01-24 ENCOUNTER — Emergency Department (HOSPITAL_COMMUNITY): Payer: Medicaid Other

## 2021-01-24 ENCOUNTER — Encounter (HOSPITAL_COMMUNITY): Payer: Self-pay

## 2021-01-24 ENCOUNTER — Other Ambulatory Visit: Payer: Self-pay

## 2021-01-24 ENCOUNTER — Emergency Department (HOSPITAL_COMMUNITY)
Admission: EM | Admit: 2021-01-24 | Discharge: 2021-01-25 | Disposition: A | Discharge status: Home / Self Care | Payer: Medicaid Other | Attending: Emergency Medicine | Admitting: Emergency Medicine

## 2021-01-24 DIAGNOSIS — R143 Flatulence: Secondary | ICD-10-CM | POA: Insufficient documentation

## 2021-01-24 DIAGNOSIS — R1013 Epigastric pain: Secondary | ICD-10-CM | POA: Diagnosis not present

## 2021-01-24 DIAGNOSIS — R112 Nausea with vomiting, unspecified: Secondary | ICD-10-CM | POA: Insufficient documentation

## 2021-01-24 DIAGNOSIS — G8929 Other chronic pain: Secondary | ICD-10-CM | POA: Insufficient documentation

## 2021-01-24 DIAGNOSIS — K921 Melena: Secondary | ICD-10-CM | POA: Insufficient documentation

## 2021-01-24 DIAGNOSIS — R1084 Generalized abdominal pain: Secondary | ICD-10-CM | POA: Diagnosis present

## 2021-01-24 DIAGNOSIS — F17210 Nicotine dependence, cigarettes, uncomplicated: Secondary | ICD-10-CM | POA: Diagnosis not present

## 2021-01-24 DIAGNOSIS — K5900 Constipation, unspecified: Secondary | ICD-10-CM | POA: Diagnosis not present

## 2021-01-24 LAB — COMPREHENSIVE METABOLIC PANEL
ALT: 33 U/L (ref 0–44)
AST: 30 U/L (ref 15–41)
Albumin: 4.2 g/dL (ref 3.5–5.0)
Alkaline Phosphatase: 103 U/L (ref 38–126)
Anion gap: 9 (ref 5–15)
BUN: 7 mg/dL (ref 6–20)
CO2: 21 mmol/L — ABNORMAL LOW (ref 22–32)
Calcium: 9.3 mg/dL (ref 8.9–10.3)
Chloride: 105 mmol/L (ref 98–111)
Creatinine, Ser: 0.71 mg/dL (ref 0.44–1.00)
GFR, Estimated: >60 mL/min (ref >60)
Glucose, Bld: 245 mg/dL — ABNORMAL HIGH (ref 70–99)
Potassium: 3.6 mmol/L (ref 3.5–5.1)
Sodium: 135 mmol/L (ref 135–145)
Total Bilirubin: <0.1 mg/dL — ABNORMAL LOW (ref 0.3–1.2)
Total Protein: 7.6 g/dL (ref 6.5–8.1)

## 2021-01-24 LAB — LIPASE, BLOOD: Lipase: 90 U/L — ABNORMAL HIGH (ref 11–51)

## 2021-01-24 LAB — CBC
HCT: 35.3 % — ABNORMAL LOW (ref 36.0–46.0)
Hemoglobin: 12.7 g/dL (ref 12.0–15.0)
MCH: 32.4 pg (ref 26.0–34.0)
MCHC: 36 g/dL (ref 30.0–36.0)
MCV: 90.1 fL (ref 80.0–100.0)
Platelets: 215 10*3/uL (ref 150–400)
RBC: 3.92 MIL/uL (ref 3.87–5.11)
RDW: 12.1 % (ref 11.5–15.5)
WBC: 11.8 10*3/uL — ABNORMAL HIGH (ref 4.0–10.5)
nRBC: 0 % (ref 0.0–0.2)

## 2021-01-24 LAB — I-STAT BETA HCG BLOOD, ED (MC, WL, AP ONLY): I-stat hCG, quantitative: <5 m[IU]/mL (ref <5)

## 2021-01-24 LAB — POC OCCULT BLOOD, ED: Fecal Occult Bld: NEGATIVE

## 2021-01-24 MED ORDER — ONDANSETRON HCL 4 MG/2ML IJ SOLN
4.0000 mg | Freq: Once | INTRAMUSCULAR | Status: AC
  Administered 2021-01-24: 4 mg via INTRAVENOUS
  Filled 2021-01-24: qty 2

## 2021-01-24 MED ORDER — PANTOPRAZOLE SODIUM 40 MG IV SOLR
40.0000 mg | Freq: Once | INTRAVENOUS | Status: AC
  Administered 2021-01-24: 40 mg via INTRAVENOUS
  Filled 2021-01-24: qty 40

## 2021-01-24 NOTE — ED Triage Notes (Signed)
Pt reports upper abdominal pain for a few days that radiates to her chest. Pt also reports lower abdominal cramping and foul smelling urine. Pt denies any other urinary problems, vaginal bleeding, and abnormal vaginal discharge.

## 2021-01-24 NOTE — ED Notes (Signed)
Pt advised she was unable to void at this time.

## 2021-01-25 LAB — URINALYSIS, ROUTINE W REFLEX MICROSCOPIC
Bacteria, UA: NONE SEEN
Bilirubin Urine: NEGATIVE
Glucose, UA: >=500 mg/dL — AB
Hgb urine dipstick: NEGATIVE
Ketones, ur: NEGATIVE mg/dL
Nitrite: NEGATIVE
Protein, ur: NEGATIVE mg/dL
Specific Gravity, Urine: 1.015 (ref 1.005–1.030)
pH: 6 (ref 5.0–8.0)

## 2021-01-25 MED ORDER — ALUM & MAG HYDROXIDE-SIMETH 200-200-20 MG/5ML PO SUSP
30.0000 mL | Freq: Once | ORAL | Status: AC
  Administered 2021-01-25: 30 mL via ORAL
  Filled 2021-01-25: qty 30

## 2021-01-25 MED ORDER — OXYCODONE-ACETAMINOPHEN 5-325 MG PO TABS
1.0000 | ORAL_TABLET | Freq: Once | ORAL | Status: AC
Start: 2021-01-25 — End: 2021-01-25
  Administered 2021-01-25: 1 via ORAL
  Filled 2021-01-25: qty 1

## 2021-01-25 NOTE — ED Notes (Signed)
Patient transported to X-ray 

## 2021-01-25 NOTE — ED Notes (Signed)
Pt reports no change in abdominal pain after medication.

## 2021-01-25 NOTE — ED Notes (Signed)
Pt verbalized understanding of d/c and follow up care. Ambulatory with steady gait.  

## 2021-01-25 NOTE — ED Provider Notes (Signed)
[facility]-EMERGENCY DEPT Provider Note   CSN: [id number] Arrival date & time: 01/24/21  2237     History Chief Complaint  Patient presents with  . Abdominal Pain    Katie Spencer is a 40 y.o. female.  The history is provided by the patient.  Abdominal Pain Pain location:  Generalized Pain quality: aching and cramping   Pain radiates to:  Chest Pain severity:  Moderate Onset quality:  Gradual Duration:  2 days Timing:  Intermittent Progression:  Worsening Chronicity:  Recurrent Relieved by:  Nothing Worsened by:  Movement and palpation Associated symptoms: constipation, flatus, melena, nausea and vomiting   Associated symptoms: no diarrhea, no dysuria, no fever, no hematemesis, no hematochezia and no shortness of breath   Patient history of chronic abdominal pain, kidney stone presents with abdominal pain present for at least 2 days.  She reports some lower abdominal cramping, then today after eating she had upper abdominal pain and vomited nonbloody emesis No fevers.  She reports at times the pain will move into her chest She also reports recent melena, but no bright red blood per rectum.  She denies dysuria but reports foul-smelling urine.  Patient reports she is supposed to follow-up with gastroenterology and have an endoscopy soon.  She has tried to avoid NSAIDs.    Past Medical History:  Diagnosis Date  . Abnormal vaginal bleeding   . Chronic abdominal pain   . Renal disorder     Patient Active Problem List   Diagnosis Date Noted  . Acute bilateral low back pain without sciatica 01/10/2021  . Lumbar radiculopathy 10/06/2020  . Wrist sprain, left, subsequent encounter 09/22/2020    Past Surgical History:  Procedure Laterality Date  . ABDOMINAL HYSTERECTOMY    . CESAREAN SECTION  2006, 2009, 2013, 2014  . CHOLECYSTECTOMY       OB History   No obstetric history on file.     History reviewed. No pertinent family history.  Social  History   Tobacco Use  . Smoking status: Current Every Day Smoker    Packs/day: 1.00    Types: Cigarettes  . Smokeless tobacco: Never Used  Vaping Use  . Vaping Use: Never used  Substance Use Topics  . Alcohol use: Not Currently  . Drug use: Never    Home Medications Prior to Admission medications   Medication Sig Start Date End Date Taking? Authorizing Provider  acetaminophen (TYLENOL) 500 MG tablet Take 1,000 mg by mouth every 6 (six) hours as needed for moderate pain.    [provider]  butalbital-acetaminophen-caffeine (FIORICET) 50-325-40 MG tablet Take 1 tablet by mouth 2 (two) times daily as needed for headache.    [provider]  citalopram (CELEXA) 40 MG tablet Take 40 mg by mouth daily. 01/01/20   [provider]  dicyclomine (BENTYL) 20 MG tablet Take 1 tablet (20 mg total) by mouth every 8 (eight) hours as needed for spasms. 12/03/20   Petrucelli, Samantha R, PA-C  hydrOXYzine (ATARAX/VISTARIL) 25 MG tablet Take 25 mg by mouth 3 (three) times daily as needed for anxiety.  04/30/20   [provider]  methocarbamol (ROBAXIN) 500 MG tablet Take 500 mg by mouth daily as needed for muscle spasms. 10/05/20   [provider]  MOBIC 15 MG tablet Take 15 mg by mouth daily as needed for pain. 10/05/20   [provider]  ondansetron (ZOFRAN ODT) 4 MG disintegrating tablet Take 1 tablet (4 mg total) by mouth every 8 (  eight) hours as needed for nausea or vomiting. 12/03/20   Petrucelli, Lelon Mast R, PA-C  pantoprazole (PROTONIX) 40 MG tablet Take 1 tablet (40 mg total) by mouth 2 (two) times daily. 10/24/20 11/23/20  Dartha Lodge, PA-C  tamsulosin (FLOMAX) 0.4 MG CAPS capsule Take 1 capsule (0.4 mg total) by mouth daily. 10/24/20   Dartha Lodge, PA-C  VRAYLAR 6 MG CAPS Take 6 mg by mouth at bedtime.  01/02/20   [provider]  belladonna-opium (B&O SUPPRETTES) 16.2-30 MG suppository Place 1 suppository rectally every 8 (eight) hours  as needed for pain. Patient not taking: Reported on 05/27/2020 05/20/20 08/18/20  Gilda Crease, MD  lansoprazole (PREVACID) 30 MG capsule Take 1 capsule daily while taking ibuprofen. Patient not taking: Reported on 05/27/2020 04/16/20 08/18/20  Molpus, Jonny Ruiz, MD    Allergies    Morphine and related and Almond (diagnostic)  Review of Systems   Review of Systems  Constitutional: Negative for fever.  Respiratory: Negative for shortness of breath.   Gastrointestinal: Positive for abdominal pain, constipation, flatus, melena, nausea and vomiting. Negative for diarrhea, hematemesis and hematochezia.  Genitourinary: Negative for dysuria.  All other systems reviewed and are negative.   Physical Exam Updated Vital Signs BP (!) 149/86 (BP Location: Left Arm)   Pulse 95   Temp 98.2 F (36.8 C) (Oral)   Resp 18   LMP 04/04/2020 Comment: neg preg test  SpO2 99%   Physical Exam  CONSTITUTIONAL: Well developed/well nourished HEAD: Normocephalic/atraumatic EYES: EOMI/PERRL, conjunctiva pink ENMT: Mucous membranes moist NECK: supple no meningeal signs SPINE/BACK:entire spine nontender CV: S1/S2 noted, no murmurs/rubs/gallops noted LUNGS: Lungs are clear to auscultation bilaterally, no apparent distress ABDOMEN: soft, distended, mild epigastric tenderness, no rebound or guarding, bowel sounds noted throughout abdomen GU:no cva tenderness Rectal-no blood or melena, Hemoccult negative.  Female chaperone present for exam NEURO: Pt is awake/alert/appropriate, moves all extremitiesx4.  No facial droop.   EXTREMITIES: pulses normal/equal, full ROM SKIN: warm, color normal PSYCH: no abnormalities of mood noted, alert and oriented to situation  ED Results / Procedures / Treatments   Labs (all labs ordered are listed, but only abnormal results are displayed) Labs Reviewed  LIPASE, BLOOD - Abnormal; Notable for the following components:      Result Value   Lipase 90 (*)    All other  components within normal limits  COMPREHENSIVE METABOLIC PANEL - Abnormal; Notable for the following components:   CO2 21 (*)    Glucose, Bld 245 (*)    Total Bilirubin <0.1 (*)    All other components within normal limits  CBC - Abnormal; Notable for the following components:   WBC 11.8 (*)    HCT 35.3 (*)    All other components within normal limits  URINALYSIS, ROUTINE W REFLEX MICROSCOPIC - Abnormal; Notable for the following components:   APPearance CLOUDY (*)    Glucose, UA >=500 (*)    Leukocytes,Ua MODERATE (*)    All other components within normal limits  I-STAT BETA HCG BLOOD, ED ([facility], [facility], [facility] ONLY)  POC OCCULT BLOOD, ED    EKG EKG Interpretation  Date/Time:  Monday Jan 24 2021 22:53:34 EDT Ventricular Rate:  86 PR Interval:  126 QRS Duration: 83 QT Interval:  357 QTC Calculation: 427 R Axis:   71 Text Interpretation: Sinus rhythm Minimal ST elevation, inferior leads 12 Lead; Mason-Likar Confirmed by Zadie Rhine ([id number]) on 01/24/2021 11:18:37 PM   Radiology DG ABD ACUTE 2+V W 1V CHEST  Result Date: 01/25/2021 CLINICAL DATA:  40 year old female with upper abdominal pain. EXAM: DG ABDOMEN ACUTE WITH 1 VIEW CHEST COMPARISON:  Radiograph dated 12/03/2020. CT abdomen pelvis dated 11/15/2020. FINDINGS: The lungs are clear. There is no pleural effusion pneumothorax. The cardiac silhouette is within limits. There is no bowel dilatation or evidence of obstruction. No free air. Faint 3 mm radiopaque focus over the inferior right renal silhouette consistent with a small kidney stone. Right upper quadrant cholecystectomy clips. The osseous structures and soft tissues are grossly unremarkable. IMPRESSION: 1. No acute cardiopulmonary process. 2. No evidence of bowel obstruction. 3. A 3 mm right renal inferior pole calculus. Electronically Signed   By: Elgie Collard M.D.   On: 01/25/2021 00:21    Procedures Procedures   Medications Ordered in ED Medications   oxyCODONE-acetaminophen (PERCOCET/ROXICET) 5-325 MG per tablet 1 tablet (has no administration in time range)  pantoprazole (PROTONIX) injection 40 mg (40 mg Intravenous Given 01/24/21 2358)  ondansetron (ZOFRAN) injection 4 mg (4 mg Intravenous Given 01/24/21 2358)  alum & mag hydroxide-simeth (MAALOX/MYLANTA) 200-200-20 MG/5ML suspension 30 mL (30 mLs Oral Given 01/25/21 0047)    ED Course  I have reviewed the triage vital signs and the nursing notes.  Pertinent labs & imaging results that were available during my care of the patient were reviewed by me and considered in my medical decision making (see chart for details).    MDM Rules/Calculators/A&P                          12:04 AM Patient presents for recurrent abdominal pain.  Patient does appear mildly distended with some abdominal tenderness.  Patient has  had multiple imaging modalities over the past several months including four CT abdomen/pelvis scans since January Labs are pending at this time.  We will obtain acute abdominal series to evaluate for any obstructive process. Patient is overall clinically appropriate at this time 1:54 AM Patient is in no acute distress.  She is able take p.o. fluids. Labs overall reassuring except for mild elevation of lipase, but low suspicion for acute pancreatitis.  Patient reports she is avoiding alcohol and NSAIDs.  She plans to quit smoking next month Patient can follow-up with GI as an outpatient, low suspicion for acute abdominal emergency No signs of GI bleed Final Clinical Impression(s) / ED Diagnoses Final diagnoses:  Epigastric pain    Rx / DC Orders ED Discharge Orders    None       Zadie Rhine, MD 01/25/21 0155

## 2021-01-25 NOTE — Discharge Instructions (Addendum)

## 2021-02-02 DIAGNOSIS — R7303 Prediabetes: Secondary | ICD-10-CM | POA: Diagnosis present

## 2021-02-08 ENCOUNTER — Ambulatory Visit: Payer: Medicaid Other | Admitting: Family Medicine

## 2021-02-10 ENCOUNTER — Other Ambulatory Visit: Payer: Self-pay

## 2021-02-10 ENCOUNTER — Emergency Department (HOSPITAL_BASED_OUTPATIENT_CLINIC_OR_DEPARTMENT_OTHER)
Admission: EM | Admit: 2021-02-10 | Discharge: 2021-02-11 | Disposition: A | Discharge status: Home / Self Care | Payer: Medicaid Other | Attending: Emergency Medicine | Admitting: Emergency Medicine

## 2021-02-10 ENCOUNTER — Encounter (HOSPITAL_BASED_OUTPATIENT_CLINIC_OR_DEPARTMENT_OTHER): Payer: Self-pay | Admitting: Emergency Medicine

## 2021-02-10 DIAGNOSIS — F17210 Nicotine dependence, cigarettes, uncomplicated: Secondary | ICD-10-CM | POA: Diagnosis not present

## 2021-02-10 DIAGNOSIS — R112 Nausea with vomiting, unspecified: Secondary | ICD-10-CM | POA: Insufficient documentation

## 2021-02-10 DIAGNOSIS — R1012 Left upper quadrant pain: Secondary | ICD-10-CM

## 2021-02-10 DIAGNOSIS — R1013 Epigastric pain: Secondary | ICD-10-CM | POA: Diagnosis not present

## 2021-02-10 MED ORDER — ONDANSETRON HCL 4 MG/2ML IJ SOLN
4.0000 mg | Freq: Once | INTRAMUSCULAR | Status: AC
  Administered 2021-02-10: 4 mg via INTRAVENOUS
  Filled 2021-02-10: qty 2

## 2021-02-10 MED ORDER — LIDOCAINE VISCOUS HCL 2 % MT SOLN
15.0000 mL | Freq: Once | OROMUCOSAL | Status: AC
  Administered 2021-02-10: 15 mL via ORAL
  Filled 2021-02-10: qty 15

## 2021-02-10 MED ORDER — ALUM & MAG HYDROXIDE-SIMETH 200-200-20 MG/5ML PO SUSP
30.0000 mL | Freq: Once | ORAL | Status: AC
  Administered 2021-02-10: 30 mL via ORAL
  Filled 2021-02-10: qty 30

## 2021-02-10 NOTE — ED Triage Notes (Signed)
Pt states was admitted 2 weeks ago for pancretitis. Feels like same pain. hasn't gotten better since, has run out of pain meds, has GI appointment on Thursday. Unable to hold down food or fluid.

## 2021-02-10 NOTE — ED Provider Notes (Signed)
[facility] EMERGENCY DEPARTMENT Provider Note  CSN: [id number] Arrival date & time: 02/10/21 2312  Chief Complaint(s) Abdominal Pain  HPI Katie Spencer is a 40 y.o. 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 Pain gradual onset. Aching/stabbing. Moderate to severe. Worse with eating and palpation. Patient is endorsing nausea and nonbloody nonbilious emesis. No diarrhea or constipation. Still having BM. No fevers or chills. No coughing or congestion.  HPI  Past Medical History Past Medical History:  Diagnosis Date  . Abnormal vaginal bleeding   . Chronic abdominal pain   . Renal disorder    Patient Active Problem List   Diagnosis Date Noted  . Acute bilateral low back pain without sciatica 01/10/2021  . Lumbar radiculopathy 10/06/2020  . Wrist sprain, left, subsequent encounter 09/22/2020   Home Medication(s) Prior to Admission medications   Medication Sig Start Date End Date Taking? Authorizing Provider  acetaminophen (TYLENOL) 500 MG tablet Take 1,000 mg by mouth every 6 (six) hours as needed for moderate pain.    [provider]  butalbital-acetaminophen-caffeine (FIORICET) 50-325-40 MG tablet Take 1 tablet by mouth 2 (two) times daily as needed for headache.    [provider]  citalopram (CELEXA) 40 MG tablet Take 40 mg by mouth daily. 01/01/20   [provider]  dicyclomine (BENTYL) 20 MG tablet Take 1 tablet (20 mg total) by mouth every 8 (eight) hours as needed for spasms. 12/03/20   Petrucelli, Samantha R, PA-C  hydrOXYzine (ATARAX/VISTARIL) 25 MG tablet Take 25 mg by mouth 3 (three) times daily as needed for anxiety.  04/30/20   [provider]  methocarbamol (ROBAXIN) 500 MG tablet Take 500 mg by mouth daily as needed for muscle spasms. 10/05/20   [provider]  MOBIC 15 MG tablet Take 15 mg by mouth daily as needed for pain. 10/05/20   [provider]  ondansetron (ZOFRAN ODT) 4 MG disintegrating tablet Take 1 tablet (4 mg total) by mouth every 8 (eight) hours as needed for nausea or vomiting. 12/03/20   Petrucelli, Samantha R, PA-C  pantoprazole (PROTONIX) 40 MG tablet Take 1 tablet (40 mg total) by mouth 2 (two) times daily. 10/24/20 11/23/20  Dartha LodgeFord, Kelsey N, PA-C  tamsulosin (FLOMAX) 0.4 MG CAPS capsule Take 1 capsule (0.4 mg total) by mouth daily. 10/24/20   Dartha LodgeFord, Kelsey N, PA-C  VRAYLAR 6 MG CAPS Take 6 mg by mouth at bedtime.  01/02/20   [provider]  belladonna-opium (B&O SUPPRETTES) 16.2-30 MG suppository Place 1 suppository rectally every 8 (eight) hours as needed for pain. Patient not taking: Reported on 05/27/2020 05/20/20 08/18/20  Gilda CreasePollina, Christopher J, MD  lansoprazole (PREVACID) 30 MG capsule Take 1 capsule daily while taking ibuprofen. Patient not taking: Reported on 05/27/2020 04/16/20 08/18/20  Molpus, Jonny RuizJohn, MD  Past Surgical History Past Surgical History:  Procedure Laterality Date  . ABDOMINAL HYSTERECTOMY    . CESAREAN SECTION  2006, 2009, 2013, 2014  . CHOLECYSTECTOMY     Family History History reviewed. No pertinent family history.  Social History Social History   Tobacco Use  . Smoking status: Current Every Day Smoker    Packs/day: 1.00    Types: Cigarettes  . Smokeless tobacco: Never Used  Vaping Use  . Vaping Use: Never used  Substance Use Topics  . Alcohol use: Not Currently  . Drug use: Never   Allergies Morphine and related and Almond (diagnostic)  Review of Systems Review of Systems All other systems are reviewed and are negative for acute change except as noted in the HPI  Physical Exam Vital Signs  I have reviewed the triage vital signs BP 122/77    Pulse 72   Temp 99 F (37.2 C) (Oral)   Resp 14   Ht 5\' 4"  (1.626 m)   Wt 104.3 kg   LMP 04/04/2020 Comment: neg preg test  SpO2 100%   BMI 39.48 kg/m   Physical Exam Vitals reviewed.  Constitutional:      General: She is not in acute distress.    Appearance: She is well-developed. She is not diaphoretic.  HENT:     Head: Normocephalic and atraumatic.     Right Ear: External ear normal.     Left Ear: External ear normal.     Nose: Nose normal.  Eyes:     General: No scleral icterus.    Conjunctiva/sclera: Conjunctivae normal.  Neck:     Trachea: Phonation normal.  Cardiovascular:     Rate and Rhythm: Normal rate and regular rhythm.  Pulmonary:     Effort: Pulmonary effort is normal. No respiratory distress.     Breath sounds: No stridor.  Abdominal:     General: There is no distension.     Tenderness: There is abdominal tenderness in the epigastric area and left upper quadrant.  Musculoskeletal:        General: Normal range of motion.     Cervical back: Normal range of motion.  Neurological:     Mental Status: She is alert and oriented to person, place, and time.  Psychiatric:        Behavior: Behavior normal.     ED Results and Treatments Labs (all labs ordered are listed, but only abnormal results are displayed) Labs Reviewed  CBC WITH DIFFERENTIAL/PLATELET - Abnormal; Notable for the following components:      Result Value   RBC 3.56 (*)    Hemoglobin 11.8 (*)    HCT 32.5 (*)    MCHC 36.3 (*)    Abs Immature Granulocytes 0.08 (*)    All other components within normal limits  COMPREHENSIVE METABOLIC PANEL - Abnormal; Notable for the following components:   Potassium 3.3 (*)    Glucose, Bld 147 (*)    All other components within normal limits  LIPASE, BLOOD  EKG  EKG Interpretation  Date/Time:    Ventricular Rate:    PR Interval:     QRS Duration:   QT Interval:    QTC Calculation:   R Axis:     Text Interpretation:        Radiology No results found.  Pertinent labs & imaging results that were available during my care of the patient were reviewed by me and considered in my medical decision making (see chart for details).  Medications Ordered in ED Medications  ondansetron (ZOFRAN) injection 4 mg (4 mg Intravenous Given 02/10/21 2355)  alum & mag hydroxide-simeth (MAALOX/MYLANTA) 200-200-20 MG/5ML suspension 30 mL (30 mLs Oral Given 02/10/21 2357)    And  lidocaine (XYLOCAINE) 2 % viscous mouth solution 15 mL (15 mLs Oral Given 02/10/21 2357)  oxyCODONE-acetaminophen (PERCOCET/ROXICET) 5-325 MG per tablet 1 tablet (1 tablet Oral Given 02/11/21 0141)                                                                                                                                    Procedures Procedures  (including critical care time)  Medical Decision Making / ED Course I have reviewed the nursing notes for this encounter and the patient's prior records (if available in EHR or on provided paperwork).   Arilla Hice was evaluated in Emergency Department on 02/11/2021 for the symptoms described in the history of present illness. She was evaluated in the context of the global COVID-19 pandemic, which necessitated consideration that the patient might be at risk for infection with the SARS-CoV-2 virus that causes COVID-19. Institutional protocols and algorithms that pertain to the evaluation of patients at risk for COVID-19 are in a state of rapid change based on information released by regulatory bodies including the CDC and federal and state organizations. These policies and algorithms were followed during the patient's care in the ED.  Epigastric/left upper quadrant abdominal pain. Labs grossly reassuring without leukocytosis or significant anemia.  No significant electrolyte derangements or renal sufficiency.  No  evidence of biliary obstruction or acute pancreatitis. To do symptomatically with antiemetics and GI cocktail which provided some relief. Provided with Percocet she was able to hold down Still able to tolerate oral intake Possible gastritis versus chronic pancreatitis. Low suspicion for other serious intra-abdominal, torsades infectious process or bowel obstruction requiring imaging at this time.      Final Clinical Impression(s) / ED Diagnoses Final diagnoses:  Left upper quadrant abdominal pain    The patient appears reasonably screened and/or stabilized for discharge and I doubt any other medical condition or other [facility] requiring further screening, evaluation, or treatment in the ED at this time prior to discharge. Safe for discharge with strict return precautions.  Disposition: Discharge  Condition: Good  I have discussed the results, Dx and Tx plan with the patient/family who expressed understanding and agree(s) with the plan. Discharge instructions discussed at length. The patient/family was given  strict return precautions who verbalized understanding of the instructions. No further questions at time of discharge.    ED Discharge Orders    None      Pain Follow Up: Loyal Jacobson, MD [address] [place] [address] [phone]  Call        This chart was dictated using voice recognition software.  Despite best efforts to proofread,  errors can occur which can change the documentation meaning.   Nira Conn, MD 02/11/21 [phone]

## 2021-02-11 LAB — COMPREHENSIVE METABOLIC PANEL
ALT: 43 U/L (ref 0–44)
AST: 35 U/L (ref 15–41)
Albumin: 3.9 g/dL (ref 3.5–5.0)
Alkaline Phosphatase: 90 U/L (ref 38–126)
Anion gap: 8 (ref 5–15)
BUN: 14 mg/dL (ref 6–20)
CO2: 24 mmol/L (ref 22–32)
Calcium: 8.9 mg/dL (ref 8.9–10.3)
Chloride: 106 mmol/L (ref 98–111)
Creatinine, Ser: 0.7 mg/dL (ref 0.44–1.00)
GFR, Estimated: >60 mL/min (ref >60)
Glucose, Bld: 147 mg/dL — ABNORMAL HIGH (ref 70–99)
Potassium: 3.3 mmol/L — ABNORMAL LOW (ref 3.5–5.1)
Sodium: 138 mmol/L (ref 135–145)
Total Bilirubin: 0.4 mg/dL (ref 0.3–1.2)
Total Protein: 7 g/dL (ref 6.5–8.1)

## 2021-02-11 LAB — CBC WITH DIFFERENTIAL/PLATELET
Abs Immature Granulocytes: 0.08 10*3/uL — ABNORMAL HIGH (ref 0.00–0.07)
Basophils Absolute: 0.1 10*3/uL (ref 0.0–0.1)
Basophils Relative: 1 %
Eosinophils Absolute: 0.2 10*3/uL (ref 0.0–0.5)
Eosinophils Relative: 2 %
HCT: 32.5 % — ABNORMAL LOW (ref 36.0–46.0)
Hemoglobin: 11.8 g/dL — ABNORMAL LOW (ref 12.0–15.0)
Immature Granulocytes: 1 %
Lymphocytes Relative: 38 %
Lymphs Abs: 3.7 10*3/uL (ref 0.7–4.0)
MCH: 33.1 pg (ref 26.0–34.0)
MCHC: 36.3 g/dL — ABNORMAL HIGH (ref 30.0–36.0)
MCV: 91.3 fL (ref 80.0–100.0)
Monocytes Absolute: 0.5 10*3/uL (ref 0.1–1.0)
Monocytes Relative: 5 %
Neutro Abs: 5.2 10*3/uL (ref 1.7–7.7)
Neutrophils Relative %: 53 %
Platelets: 269 10*3/uL (ref 150–400)
RBC: 3.56 MIL/uL — ABNORMAL LOW (ref 3.87–5.11)
RDW: 12.7 % (ref 11.5–15.5)
WBC: 9.8 10*3/uL (ref 4.0–10.5)
nRBC: 0 % (ref 0.0–0.2)

## 2021-02-11 LAB — LIPASE, BLOOD: Lipase: 51 U/L (ref 11–51)

## 2021-02-11 MED ORDER — OXYCODONE-ACETAMINOPHEN 5-325 MG PO TABS
1.0000 | ORAL_TABLET | Freq: Once | ORAL | Status: AC
Start: 2021-02-11 — End: 2021-02-11
  Administered 2021-02-11: 1 via ORAL
  Filled 2021-02-11: qty 1

## 2021-03-03 ENCOUNTER — Other Ambulatory Visit: Payer: Self-pay

## 2021-03-03 ENCOUNTER — Encounter (HOSPITAL_COMMUNITY): Payer: Self-pay

## 2021-03-03 ENCOUNTER — Emergency Department (HOSPITAL_COMMUNITY)
Admission: EM | Admit: 2021-03-03 | Discharge: 2021-03-03 | Disposition: A | Discharge status: Home / Self Care | Payer: Medicaid Other | Attending: Emergency Medicine | Admitting: Emergency Medicine

## 2021-03-03 DIAGNOSIS — K0889 Other specified disorders of teeth and supporting structures: Secondary | ICD-10-CM | POA: Diagnosis present

## 2021-03-03 DIAGNOSIS — F17210 Nicotine dependence, cigarettes, uncomplicated: Secondary | ICD-10-CM | POA: Insufficient documentation

## 2021-03-03 DIAGNOSIS — K2970 Gastritis, unspecified, without bleeding: Secondary | ICD-10-CM

## 2021-03-03 LAB — CBC WITH DIFFERENTIAL/PLATELET
Abs Immature Granulocytes: 0.06 10*3/uL (ref 0.00–0.07)
Basophils Absolute: 0.1 10*3/uL (ref 0.0–0.1)
Basophils Relative: 1 %
Eosinophils Absolute: 0.2 10*3/uL (ref 0.0–0.5)
Eosinophils Relative: 2 %
HCT: 31.9 % — ABNORMAL LOW (ref 36.0–46.0)
Hemoglobin: 11.4 g/dL — ABNORMAL LOW (ref 12.0–15.0)
Immature Granulocytes: 1 %
Lymphocytes Relative: 30 %
Lymphs Abs: 3.2 10*3/uL (ref 0.7–4.0)
MCH: 32.9 pg (ref 26.0–34.0)
MCHC: 35.7 g/dL (ref 30.0–36.0)
MCV: 91.9 fL (ref 80.0–100.0)
Monocytes Absolute: 0.8 10*3/uL (ref 0.1–1.0)
Monocytes Relative: 7 %
Neutro Abs: 6.2 10*3/uL (ref 1.7–7.7)
Neutrophils Relative %: 59 %
Platelets: 203 10*3/uL (ref 150–400)
RBC: 3.47 MIL/uL — ABNORMAL LOW (ref 3.87–5.11)
RDW: 12.5 % (ref 11.5–15.5)
WBC: 10.4 10*3/uL (ref 4.0–10.5)
nRBC: 0 % (ref 0.0–0.2)

## 2021-03-03 LAB — COMPREHENSIVE METABOLIC PANEL
ALT: 44 U/L (ref 0–44)
AST: 32 U/L (ref 15–41)
Albumin: 4.1 g/dL (ref 3.5–5.0)
Alkaline Phosphatase: 90 U/L (ref 38–126)
Anion gap: 10 (ref 5–15)
BUN: 16 mg/dL (ref 6–20)
CO2: 22 mmol/L (ref 22–32)
Calcium: 9.1 mg/dL (ref 8.9–10.3)
Chloride: 103 mmol/L (ref 98–111)
Creatinine, Ser: 0.78 mg/dL (ref 0.44–1.00)
GFR, Estimated: >60 mL/min (ref >60)
Glucose, Bld: 174 mg/dL — ABNORMAL HIGH (ref 70–99)
Potassium: 3.7 mmol/L (ref 3.5–5.1)
Sodium: 135 mmol/L (ref 135–145)
Total Bilirubin: 0.5 mg/dL (ref 0.3–1.2)
Total Protein: 7.2 g/dL (ref 6.5–8.1)

## 2021-03-03 LAB — LIPASE, BLOOD: Lipase: 37 U/L (ref 11–51)

## 2021-03-03 MED ORDER — SUCRALFATE 1 GM/10ML PO SUSP: 1.0000 g | Freq: Three times a day (TID) | ORAL | 0 refills | Status: DC

## 2021-03-03 MED ORDER — LIDOCAINE VISCOUS HCL 2 % MT SOLN
15.0000 mL | Freq: Once | OROMUCOSAL | Status: AC
  Administered 2021-03-03: 15 mL via OROMUCOSAL
  Filled 2021-03-03: qty 15

## 2021-03-03 MED ORDER — KETOROLAC TROMETHAMINE 30 MG/ML IJ SOLN
30.0000 mg | Freq: Once | INTRAMUSCULAR | Status: AC
  Administered 2021-03-03: 30 mg via INTRAMUSCULAR
  Filled 2021-03-03: qty 1

## 2021-03-03 MED ORDER — LIDOCAINE VISCOUS HCL 2 % MT SOLN: 15.0000 mL | Freq: Four times a day (QID) | OROMUCOSAL | 0 refills | Status: DC | PRN

## 2021-03-03 MED ORDER — ACETAMINOPHEN 500 MG PO TABS
1000.0000 mg | ORAL_TABLET | Freq: Once | ORAL | Status: AC
  Administered 2021-03-03: 1000 mg via ORAL
  Filled 2021-03-03: qty 2

## 2021-03-03 MED ORDER — ALUM & MAG HYDROXIDE-SIMETH 200-200-20 MG/5ML PO SUSP
30.0000 mL | Freq: Once | ORAL | Status: AC
  Administered 2021-03-03: 30 mL via ORAL
  Filled 2021-03-03: qty 30

## 2021-03-03 NOTE — ED Provider Notes (Signed)
[facility]-EMERGENCY DEPT Provider Note   CSN: [id number] Arrival date & time: 03/03/21  2023     History Chief Complaint  Patient presents with   Abdominal Pain   Dental Pain    Katie Spencer is a 40 y.o. female.  HPI 40 year old female presents with a chief complaint of left dental pain.  2 of her maxillary teeth have been hurting for a week and a half.  She recently was on an antibiotic 3 times daily but it did not seem to help.  She has a dental appointment in the next few days.  She has been taking Tylenol and ibuprofen without relief.  No swelling, trouble breathing or swallowing.  She has also had abdominal pain starting tonight.  This is epigastric and recurrent for her.  She wonders if she has recurrent pancreatitis though she also has a history of gastric ulcers.  Started after her meal tonight though she did not eat anything she thinks could have caused it.  Some nausea but no vomiting.  Past Medical History:  Diagnosis Date   Abnormal vaginal bleeding    Chronic abdominal pain    Renal disorder     Patient Active Problem List   Diagnosis Date Noted   Acute bilateral low back pain without sciatica 01/10/2021   Lumbar radiculopathy 10/06/2020   Wrist sprain, left, subsequent encounter 09/22/2020    Past Surgical History:  Procedure Laterality Date   ABDOMINAL HYSTERECTOMY     CESAREAN SECTION  2006, 2009, 2013, 2014   CHOLECYSTECTOMY       OB History   No obstetric history on file.     History reviewed. No pertinent family history.  Social History   Tobacco Use   Smoking status: Every Day    Packs/day: 1.00    Pack years: 0.00    Types: Cigarettes   Smokeless tobacco: Never  Vaping Use   Vaping Use: Never used  Substance Use Topics   Alcohol use: Not Currently   Drug use: Never    Home Medications Prior to Admission medications   Medication Sig Start Date End Date Taking? Authorizing Provider  lidocaine (XYLOCAINE) 2  % solution Use as directed 15 mLs in the mouth or throat every 6 (six) hours as needed for mouth pain. 03/03/21  Yes Pricilla Loveless, MD  sucralfate (CARAFATE) 1 GM/10ML suspension Take 10 mLs (1 g total) by mouth 4 (four) times daily -  with meals and at bedtime. 03/03/21  Yes Pricilla Loveless, MD  acetaminophen (TYLENOL) 500 MG tablet Take 1,000 mg by mouth every 6 (six) hours as needed for moderate pain.    [provider]  butalbital-acetaminophen-caffeine (FIORICET) 50-325-40 MG tablet Take 1 tablet by mouth 2 (two) times daily as needed for headache.    [provider]  citalopram (CELEXA) 40 MG tablet Take 40 mg by mouth daily. 01/01/20   [provider]  dicyclomine (BENTYL) 20 MG tablet Take 1 tablet (20 mg total) by mouth every 8 (eight) hours as needed for spasms. 12/03/20   Petrucelli, Samantha R, PA-C  hydrOXYzine (ATARAX/VISTARIL) 25 MG tablet Take 25 mg by mouth 3 (three) times daily as needed for anxiety.  04/30/20   [provider]  methocarbamol (ROBAXIN) 500 MG tablet Take 500 mg by mouth daily as needed for muscle spasms. 10/05/20   [provider]  MOBIC 15 MG tablet Take 15 mg by mouth daily as needed for pain. 10/05/20   [provider]  ondansetron (ZOFRAN ODT) 4 MG disintegrating tablet Take 1 tablet (4 mg total) by mouth every 8 (eight) hours as needed for nausea or vomiting. 12/03/20   Petrucelli, Samantha R, PA-C  pantoprazole (PROTONIX) 40 MG tablet Take 1 tablet (40 mg total) by mouth 2 (two) times daily. 10/24/20 11/23/20  Dartha Lodge, PA-C  tamsulosin (FLOMAX) 0.4 MG CAPS capsule Take 1 capsule (0.4 mg total) by mouth daily. 10/24/20   Dartha Lodge, PA-C  VRAYLAR 6 MG CAPS Take 6 mg by mouth at bedtime.  01/02/20   [provider]  belladonna-opium (B&O SUPPRETTES) 16.2-30 MG suppository Place 1 suppository rectally every 8 (eight) hours as needed for pain. Patient not taking: Reported on 05/27/2020 05/20/20 08/18/20   Gilda Crease, MD  lansoprazole (PREVACID) 30 MG capsule Take 1 capsule daily while taking ibuprofen. Patient not taking: Reported on 05/27/2020 04/16/20 08/18/20  Molpus, Jonny Ruiz, MD    Allergies    Morphine and related and Almond (diagnostic)  Review of Systems   Review of Systems  HENT:  Positive for dental problem. Negative for facial swelling.   Gastrointestinal:  Positive for abdominal pain and nausea. Negative for vomiting.  All other systems reviewed and are negative.  Physical Exam Updated Vital Signs BP 130/82   Pulse 84   Temp 99.2 F (37.3 C) (Oral)   Resp 17   LMP 04/04/2020 Comment: neg preg test  SpO2 99%   Physical Exam Vitals and nursing note reviewed.  Constitutional:      Appearance: She is well-developed.  HENT:     Head: Normocephalic and atraumatic.     Right Ear: External ear normal.     Left Ear: External ear normal.     Nose: Nose normal.     Mouth/Throat:      Comments: Missing teeth but no obvious swelling Eyes:     General:        Right eye: No discharge.        Left eye: No discharge.  Cardiovascular:     Rate and Rhythm: Normal rate and regular rhythm.     Heart sounds: Normal heart sounds.  Pulmonary:     Effort: Pulmonary effort is normal.     Breath sounds: Normal breath sounds.  Abdominal:     Palpations: Abdomen is soft.     Tenderness: There is abdominal tenderness in the epigastric area.  Skin:    General: Skin is warm and dry.  Neurological:     Mental Status: She is alert.  Psychiatric:        Mood and Affect: Mood is not anxious.    ED Results / Procedures / Treatments   Labs (all labs ordered are listed, but only abnormal results are displayed) Labs Reviewed  COMPREHENSIVE METABOLIC PANEL - Abnormal; Notable for the following components:      Result Value   Glucose, Bld 174 (*)    All other components within normal limits  CBC WITH DIFFERENTIAL/PLATELET - Abnormal; Notable for the following components:   RBC  3.47 (*)    Hemoglobin 11.4 (*)    HCT 31.9 (*)    All other components within normal limits  LIPASE, BLOOD    EKG None  Radiology No results found.  Procedures Procedures   Medications Ordered in ED Medications  ketorolac (TORADOL) 30 MG/ML injection 30 mg (has no administration in time range)  lidocaine (XYLOCAINE) 2 % viscous mouth solution 15 mL (15 mLs Mouth/Throat Given 03/03/21  2130)  alum & mag hydroxide-simeth (MAALOX/MYLANTA) 200-200-20 MG/5ML suspension 30 mL (30 mLs Oral Given 03/03/21 2130)  acetaminophen (TYLENOL) tablet 1,000 mg (1,000 mg Oral Given 03/03/21 2129)    ED Course  I have reviewed the triage vital signs and the nursing notes.  Pertinent labs & imaging results that were available during my care of the patient were reviewed by me and considered in my medical decision making (see chart for details).    MDM Rules/Calculators/A&P                          Patient has epigastric tenderness that is probably related to her gastritis, which she has had for a long time.  She does feel better with some Maalox.  Has an otherwise benign abdominal exam besides focal epigastric tenderness.  I do not think emergent CT is needed and chart review so she just got a CT for similar presentation 1 week ago at an outside hospital that was unremarkable.  As for her dental pain, there is no obvious infectious cause and she has been on 2 different rounds of antibiotics for the same type of dental pain.  I do not think further antibiotics is warranted.  She is asking for dose of Toradol which I discussed could make her gastritis worse but I think is reasonable to try as a one-time treatment for her dental complaint.  She otherwise appears stable for discharge home and states she has a dentist appointment on 6/20.  Given return precautions. Final Clinical Impression(s) / ED Diagnoses Final diagnoses:  Pain, dental  Gastritis without bleeding, unspecified chronicity, unspecified  gastritis type    Rx / DC Orders ED Discharge Orders          Ordered    sucralfate (CARAFATE) 1 GM/10ML suspension  3 times daily with meals & bedtime        03/03/21 2233    lidocaine (XYLOCAINE) 2 % solution  Every 6 hours PRN        03/03/21 2233             Pricilla Loveless, MD 03/03/21 2246

## 2021-03-03 NOTE — ED Triage Notes (Signed)
Pt reports dental pain for a few days. Pt also endorses upper abdominal pain and nausea. Pt has hx of pancreatitis.

## 2021-03-03 NOTE — Discharge Instructions (Addendum)
If you develop worsening, continued, or recurrent abdominal pain, uncontrolled vomiting, fever, chest or back pain, or any other new/concerning symptoms then return to the ER for evaluation.  

## 2021-03-19 ENCOUNTER — Encounter (HOSPITAL_BASED_OUTPATIENT_CLINIC_OR_DEPARTMENT_OTHER): Payer: Self-pay | Admitting: Emergency Medicine

## 2021-03-19 ENCOUNTER — Emergency Department (HOSPITAL_BASED_OUTPATIENT_CLINIC_OR_DEPARTMENT_OTHER)
Admission: EM | Admit: 2021-03-19 | Discharge: 2021-03-19 | Disposition: A | Discharge status: Home / Self Care | Payer: Medicaid Other | Attending: Emergency Medicine | Admitting: Emergency Medicine

## 2021-03-19 ENCOUNTER — Other Ambulatory Visit: Payer: Self-pay

## 2021-03-19 DIAGNOSIS — R109 Unspecified abdominal pain: Secondary | ICD-10-CM | POA: Diagnosis present

## 2021-03-19 DIAGNOSIS — N23 Unspecified renal colic: Secondary | ICD-10-CM | POA: Diagnosis not present

## 2021-03-19 DIAGNOSIS — F17210 Nicotine dependence, cigarettes, uncomplicated: Secondary | ICD-10-CM | POA: Insufficient documentation

## 2021-03-19 MED ORDER — ONDANSETRON 4 MG PO TBDP
8.0000 mg | ORAL_TABLET | Freq: Once | ORAL | Status: AC
  Administered 2021-03-19: 8 mg via ORAL
  Filled 2021-03-19: qty 2

## 2021-03-19 MED ORDER — KETOROLAC TROMETHAMINE 30 MG/ML IJ SOLN
30.0000 mg | Freq: Once | INTRAMUSCULAR | Status: AC
  Administered 2021-03-19: 30 mg via INTRAMUSCULAR
  Filled 2021-03-19: qty 1

## 2021-03-19 MED ORDER — OXYCODONE-ACETAMINOPHEN 5-325 MG PO TABS: 1.0000 | ORAL_TABLET | ORAL | 0 refills | Status: DC | PRN

## 2021-03-19 NOTE — ED Triage Notes (Signed)
Pt states she has a known kidney stone on the right side  Pt states she is having pain unrelieved by her pain medications

## 2021-03-19 NOTE — ED Provider Notes (Signed)
[facility] Provider Note: [name], MD, FACEP  CSN: [id number] MRN: [id number] ARRIVAL: 03/19/21 at 0130 ROOM: [facility]06/[facility]06   CHIEF COMPLAINT  Flank Pain   HISTORY OF PRESENT ILLNESS  03/19/21 3:37 AM Katie Spencer is a 40 y.o. female who was diagnosed with a 3 mm right proximal ureteral stone on 03/15/2021 at [facility].  She was given prescriptions for Flomax (7 days) and Oxy IR (12 tablets).  She is here because the pain, which had been relieved with the Oxy IR, became more severe.  She rates it as a 10 out of 10 and sharp in nature.  It is in the right flank.  It is somewhat worse with palpation.  She has had associated nausea but no vomiting.   Past Medical History:  Diagnosis Date   Abnormal vaginal bleeding    Chronic abdominal pain    Renal disorder     Past Surgical History:  Procedure Laterality Date   ABDOMINAL HYSTERECTOMY     CESAREAN SECTION  2006, 2009, 2013, 2014   CHOLECYSTECTOMY      Family History  Adopted: Yes    Social History   Tobacco Use   Smoking status: Every Day    Packs/day: 1.00    Pack years: 0.00    Types: Cigarettes   Smokeless tobacco: Never  Vaping Use   Vaping Use: Every day   Substances: Nicotine  Substance Use Topics   Alcohol use: Not Currently   Drug use: Never    Prior to Admission medications   Medication Sig Start Date End Date Taking? Authorizing Provider  oxyCODONE-acetaminophen (PERCOCET) 5-325 MG tablet Take 1 tablet by mouth every 4 (four) hours as needed for severe pain (kidney stone pain). 03/19/21  Yes Jerred Zaremba, MD  butalbital-acetaminophen-caffeine (FIORICET) 50-325-40 MG tablet Take 1 tablet by mouth 2 (two) times daily as needed for headache.    [provider]  citalopram (CELEXA) 40 MG tablet Take 40 mg by mouth daily. 01/01/20   [provider]  dicyclomine (BENTYL) 20 MG tablet Take 1 tablet (20 mg total) by mouth every 8 (eight) hours as needed for spasms.  12/03/20   Petrucelli, Samantha R, PA-C  hydrOXYzine (ATARAX/VISTARIL) 25 MG tablet Take 25 mg by mouth 3 (three) times daily as needed for anxiety.  04/30/20   [provider]  lidocaine (XYLOCAINE) 2 % solution Use as directed 15 mLs in the mouth or throat every 6 (six) hours as needed for mouth pain. 03/03/21   Pricilla Loveless, MD  methocarbamol (ROBAXIN) 500 MG tablet Take 500 mg by mouth daily as needed for muscle spasms. 10/05/20   [provider]  MOBIC 15 MG tablet Take 15 mg by mouth daily as needed for pain. 10/05/20   [provider]  ondansetron (ZOFRAN ODT) 4 MG disintegrating tablet Take 1 tablet (4 mg total) by mouth every 8 (eight) hours as needed for nausea or vomiting. 12/03/20   Petrucelli, Samantha R, PA-C  pantoprazole (PROTONIX) 40 MG tablet Take 1 tablet (40 mg total) by mouth 2 (two) times daily. 10/24/20 11/23/20  Dartha Lodge, PA-C  sucralfate (CARAFATE) 1 GM/10ML suspension Take 10 mLs (1 g total) by mouth 4 (four) times daily -  with meals and at bedtime. 03/03/21   Pricilla Loveless, MD  tamsulosin (FLOMAX) 0.4 MG CAPS capsule Take 1 capsule (0.4 mg total) by mouth daily. 10/24/20   Dartha Lodge, PA-C  VRAYLAR 6 MG CAPS Take 6 mg  by mouth at bedtime.  01/02/20   [provider]  belladonna-opium (B&O SUPPRETTES) 16.2-30 MG suppository Place 1 suppository rectally every 8 (eight) hours as needed for pain. Patient not taking: Reported on 05/27/2020 05/20/20 08/18/20  Gilda Crease, MD  lansoprazole (PREVACID) 30 MG capsule Take 1 capsule daily while taking ibuprofen. Patient not taking: Reported on 05/27/2020 04/16/20 08/18/20  Shewanda Sharpe, MD    Allergies Morphine and related and Almond (diagnostic)   REVIEW OF SYSTEMS  Negative except as noted here or in the History of Present Illness.   PHYSICAL EXAMINATION  Initial Vital Signs Blood pressure (!) 140/92, pulse 83, temperature 98.5 F (36.9 C), temperature source Oral, resp. rate 20,  height 5\' 4"  (1.626 m), weight 104.3 kg, last menstrual period 04/04/2020, SpO2 100 %.  Examination General: Well-developed, well-nourished female in no acute distress; appearance consistent with age of record HENT: normocephalic; atraumatic Eyes: Normal appearance Neck: supple Heart: regular rate and rhythm Lungs: Faint expiratory wheezes Abdomen: soft; nondistended; nontender; bowel sounds present GU: Mild right CVA tenderness Extremities: No deformity; full range of motion; pulses normal Neurologic: Awake, alert and oriented; motor function intact in all extremities and symmetric; no facial droop Skin: Warm and dry Psychiatric: Normal mood and affect   RESULTS  Summary of this visit's results, reviewed and interpreted by myself:   EKG Interpretation  Date/Time:    Ventricular Rate:    PR Interval:    QRS Duration:   QT Interval:    QTC Calculation:   R Axis:     Text Interpretation:          Laboratory Studies: No results found for this or any previous visit (from the past 24 hour(s)). Imaging Studies: No results found.  ED COURSE and MDM  Nursing notes, initial and subsequent vitals signs, including pulse oximetry, reviewed and interpreted by myself.  Vitals:   03/19/21 0138 03/19/21 0141  BP:  (!) 140/92  Pulse:  83  Resp:  20  Temp:  98.5 F (36.9 C)  TempSrc:  Oral  SpO2:  100%  Weight: 104.3 kg   Height: 5\' 4"  (1.626 m)    Medications  ondansetron (ZOFRAN-ODT) disintegrating tablet 8 mg (has no administration in time range)  ketorolac (TORADOL) 30 MG/ML injection 30 mg (has no administration in time range)   Since the patient is driving we will give her a shot of Toradol and give her a prescription for oxycodone/APAP.  She has Zofran at home.   PROCEDURES  Procedures   ED DIAGNOSES     ICD-10-CM   1. Ureteral colic  N23          Jeniah Kishi, MD 03/19/21 [phone]

## 2021-04-02 ENCOUNTER — Emergency Department (HOSPITAL_BASED_OUTPATIENT_CLINIC_OR_DEPARTMENT_OTHER)
Admission: EM | Admit: 2021-04-02 | Discharge: 2021-04-03 | Disposition: A | Discharge status: Home / Self Care | Payer: Medicaid Other | Attending: Emergency Medicine | Admitting: Emergency Medicine

## 2021-04-02 ENCOUNTER — Other Ambulatory Visit: Payer: Self-pay

## 2021-04-02 ENCOUNTER — Encounter (HOSPITAL_BASED_OUTPATIENT_CLINIC_OR_DEPARTMENT_OTHER): Payer: Self-pay | Admitting: Emergency Medicine

## 2021-04-02 DIAGNOSIS — F17210 Nicotine dependence, cigarettes, uncomplicated: Secondary | ICD-10-CM | POA: Insufficient documentation

## 2021-04-02 DIAGNOSIS — N200 Calculus of kidney: Secondary | ICD-10-CM | POA: Diagnosis not present

## 2021-04-02 DIAGNOSIS — R109 Unspecified abdominal pain: Secondary | ICD-10-CM | POA: Diagnosis present

## 2021-04-02 MED ORDER — HYDROMORPHONE HCL 1 MG/ML IJ SOLN
1.0000 mg | Freq: Once | INTRAMUSCULAR | Status: AC
  Administered 2021-04-02: 1 mg via INTRAVENOUS
  Filled 2021-04-02: qty 1

## 2021-04-02 MED ORDER — ONDANSETRON HCL 4 MG/2ML IJ SOLN
4.0000 mg | Freq: Once | INTRAMUSCULAR | Status: AC
  Administered 2021-04-02: 4 mg via INTRAVENOUS
  Filled 2021-04-02: qty 2

## 2021-04-02 MED ORDER — HYDROMORPHONE HCL 1 MG/ML IJ SOLN
1.0000 mg | Freq: Once | INTRAMUSCULAR | Status: AC
Start: 2021-04-02 — End: 2021-04-02
  Administered 2021-04-02: 1 mg via INTRAVENOUS
  Filled 2021-04-02: qty 1

## 2021-04-02 NOTE — ED Provider Notes (Signed)
[facility] EMERGENCY DEPARTMENT Provider Note   CSN: [id number] Arrival date & time: 04/02/21  2055     History Chief Complaint  Patient presents with   Flank Pain    Katie Spencer is a 40 y.o. female.  Patient presents to the emergency department for evaluation of right flank pain.  Patient has been experiencing renal colic for 2 weeks.  She has been previously seen emergency department at [facility] for this, documented to have a kidney stone and is scheduled for lithotripsy on Monday morning.  She has been using her Percocet as prescribed but tonight it did not help her pain.  No fever.      Past Medical History:  Diagnosis Date   Abnormal vaginal bleeding    Chronic abdominal pain    Renal disorder     Patient Active Problem List   Diagnosis Date Noted   Acute bilateral low back pain without sciatica 01/10/2021   Lumbar radiculopathy 10/06/2020   Wrist sprain, left, subsequent encounter 09/22/2020    Past Surgical History:  Procedure Laterality Date   ABDOMINAL HYSTERECTOMY     CESAREAN SECTION  2006, 2009, 2013, 2014   CHOLECYSTECTOMY       OB History   No obstetric history on file.     Family History  Adopted: Yes    Social History   Tobacco Use   Smoking status: Every Day    Packs/day: 1.00    Types: Cigarettes   Smokeless tobacco: Never  Vaping Use   Vaping Use: Every day   Substances: Nicotine  Substance Use Topics   Alcohol use: Not Currently   Drug use: Never    Home Medications Prior to Admission medications   Medication Sig Start Date End Date Taking? Authorizing Provider  butalbital-acetaminophen-caffeine (FIORICET) 50-325-40 MG tablet Take 1 tablet by mouth 2 (two) times daily as needed for headache.    [provider]  citalopram (CELEXA) 40 MG tablet Take 40 mg by mouth daily. 01/01/20   [provider]  dicyclomine (BENTYL) 20 MG tablet Take 1 tablet (20 mg total) by mouth every 8 (eight) hours as  needed for spasms. 12/03/20   Petrucelli, Samantha R, PA-C  hydrOXYzine (ATARAX/VISTARIL) 25 MG tablet Take 25 mg by mouth 3 (three) times daily as needed for anxiety.  04/30/20   [provider]  lidocaine (XYLOCAINE) 2 % solution Use as directed 15 mLs in the mouth or throat every 6 (six) hours as needed for mouth pain. 03/03/21   Pricilla Loveless, MD  methocarbamol (ROBAXIN) 500 MG tablet Take 500 mg by mouth daily as needed for muscle spasms. 10/05/20   [provider]  MOBIC 15 MG tablet Take 15 mg by mouth daily as needed for pain. 10/05/20   [provider]  ondansetron (ZOFRAN ODT) 4 MG disintegrating tablet Take 1 tablet (4 mg total) by mouth every 8 (eight) hours as needed for nausea or vomiting. 12/03/20   Petrucelli, Lelon Mast R, PA-C  oxyCODONE-acetaminophen (PERCOCET) 5-325 MG tablet Take 1 tablet by mouth every 4 (four) hours as needed for severe pain (kidney stone pain). 03/19/21   Molpus, John, MD  pantoprazole (PROTONIX) 40 MG tablet Take 1 tablet (40 mg total) by mouth 2 (two) times daily. 10/24/20 11/23/20  Dartha Lodge, PA-C  sucralfate (CARAFATE) 1 GM/10ML suspension Take 10 mLs (1 g total) by mouth 4 (four) times daily -  with meals and at bedtime. 03/03/21   Pricilla Loveless, MD  tamsulosin (  FLOMAX) 0.4 MG CAPS capsule Take 1 capsule (0.4 mg total) by mouth daily. 10/24/20   Dartha Lodge, PA-C  VRAYLAR 6 MG CAPS Take 6 mg by mouth at bedtime.  01/02/20   [provider]  belladonna-opium (B&O SUPPRETTES) 16.2-30 MG suppository Place 1 suppository rectally every 8 (eight) hours as needed for pain. Patient not taking: Reported on 05/27/2020 05/20/20 08/18/20  Gilda Crease, MD  lansoprazole (PREVACID) 30 MG capsule Take 1 capsule daily while taking ibuprofen. Patient not taking: Reported on 05/27/2020 04/16/20 08/18/20  Molpus, Jonny Ruiz, MD    Allergies    Morphine and related and Almond (diagnostic)  Review of Systems   Review of Systems  Genitourinary:   Positive for flank pain.  All other systems reviewed and are negative.  Physical Exam Updated Vital Signs BP 132/64 (BP Location: Right Arm)   Pulse 84   Temp 98.5 F (36.9 C) (Oral)   Resp 18   Ht 5\' 4"  (1.626 m)   Wt 104.3 kg   LMP 04/04/2020 Comment: neg preg test  SpO2 100%   BMI 39.48 kg/m   Physical Exam Vitals and nursing note reviewed.  Constitutional:      General: She is not in acute distress.    Appearance: Normal appearance. She is well-developed.  HENT:     Head: Normocephalic and atraumatic.     Right Ear: Hearing normal.     Left Ear: Hearing normal.     Nose: Nose normal.  Eyes:     Conjunctiva/sclera: Conjunctivae normal.     Pupils: Pupils are equal, round, and reactive to light.  Cardiovascular:     Rate and Rhythm: Regular rhythm.     Heart sounds: S1 normal and S2 normal. No murmur heard.   No friction rub. No gallop.  Pulmonary:     Effort: Pulmonary effort is normal. No respiratory distress.     Breath sounds: Normal breath sounds.  Chest:     Chest wall: No tenderness.  Abdominal:     General: Bowel sounds are normal.     Palpations: Abdomen is soft.     Tenderness: There is no abdominal tenderness. There is no guarding or rebound. Negative signs include Murphy's sign and McBurney's sign.     Hernia: No hernia is present.  Musculoskeletal:        General: Normal range of motion.     Cervical back: Normal range of motion and neck supple.  Skin:    General: Skin is warm and dry.     Findings: No rash.  Neurological:     Mental Status: She is alert and oriented to person, place, and time.     GCS: GCS eye subscore is 4. GCS verbal subscore is 5. GCS motor subscore is 6.     Cranial Nerves: No cranial nerve deficit.     Sensory: No sensory deficit.     Coordination: Coordination normal.  Psychiatric:        Speech: Speech normal.        Behavior: Behavior normal.        Thought Content: Thought content normal.    ED Results /  Procedures / Treatments   Labs (all labs ordered are listed, but only abnormal results are displayed) Labs Reviewed - No data to display   EKG None  Radiology No results found.  Procedures Procedures   Medications Ordered in ED Medications  HYDROmorphone (DILAUDID) injection 1 mg (1 mg Intravenous Given 04/02/21 2319)  ondansetron [facility]) injection 4 mg (4 mg Intravenous Given 04/02/21 2318)  HYDROmorphone (DILAUDID) injection 1 mg (1 mg Intravenous Given 04/02/21 2356)    ED Course  I have reviewed the triage vital signs and the nursing notes.  Pertinent labs & imaging results that were available during my care of the patient were reviewed by me and considered in my medical decision making (see chart for details).    MDM Rules/Calculators/A&P                          Patient presents to the emergency department for evaluation of right flank pain.  Patient has a known kidney stone.  I did confirm that she has lithotripsy scheduled in 2 days.  Patient reports that she has been having increased pain overnight.  She does not require any work-up at this time, records reviewed from previous visits.  She was provided analgesia and discharged.  Final Clinical Impression(s) / ED Diagnoses Final diagnoses:  Kidney stone    Rx / DC Orders ED Discharge Orders     None        Isao Seltzer, Canary Brim, MD 04/03/21 [phone]

## 2021-04-02 NOTE — Discharge Instructions (Addendum)
If your pain worsens and you need to be seen again, you should go to where your urologist practices so they can reevaluate you.

## 2021-04-02 NOTE — ED Triage Notes (Addendum)
Pt c/o RT flank pain; scheduled for lithotripsy Mon, but pain is worse; took percocet x 2 at 1900

## 2021-04-08 ENCOUNTER — Other Ambulatory Visit: Payer: Self-pay

## 2021-04-08 ENCOUNTER — Encounter (HOSPITAL_BASED_OUTPATIENT_CLINIC_OR_DEPARTMENT_OTHER): Payer: Self-pay | Admitting: Emergency Medicine

## 2021-04-08 ENCOUNTER — Emergency Department (HOSPITAL_BASED_OUTPATIENT_CLINIC_OR_DEPARTMENT_OTHER)
Admission: EM | Admit: 2021-04-08 | Discharge: 2021-04-08 | Disposition: A | Discharge status: Home / Self Care | Payer: Medicaid Other | Attending: Emergency Medicine | Admitting: Emergency Medicine

## 2021-04-08 DIAGNOSIS — N23 Unspecified renal colic: Secondary | ICD-10-CM | POA: Insufficient documentation

## 2021-04-08 DIAGNOSIS — R109 Unspecified abdominal pain: Secondary | ICD-10-CM | POA: Diagnosis present

## 2021-04-08 DIAGNOSIS — F17210 Nicotine dependence, cigarettes, uncomplicated: Secondary | ICD-10-CM | POA: Insufficient documentation

## 2021-04-08 DIAGNOSIS — N201 Calculus of ureter: Secondary | ICD-10-CM

## 2021-04-08 LAB — URINALYSIS, ROUTINE W REFLEX MICROSCOPIC
Bilirubin Urine: NEGATIVE
Glucose, UA: NEGATIVE mg/dL
Ketones, ur: NEGATIVE mg/dL
Leukocytes,Ua: NEGATIVE
Nitrite: NEGATIVE
Protein, ur: NEGATIVE mg/dL
Specific Gravity, Urine: <1.005 — ABNORMAL LOW (ref 1.005–1.030)
pH: 7 (ref 5.0–8.0)

## 2021-04-08 LAB — URINALYSIS, MICROSCOPIC (REFLEX)

## 2021-04-08 MED ORDER — HYDROMORPHONE HCL 1 MG/ML IJ SOLN
1.0000 mg | Freq: Once | INTRAMUSCULAR | Status: AC
  Administered 2021-04-08: 1 mg via INTRAVENOUS
  Filled 2021-04-08: qty 1

## 2021-04-08 MED ORDER — HYDROMORPHONE HCL 2 MG PO TABS: 2.0000 mg | ORAL_TABLET | ORAL | 0 refills | Status: DC | PRN

## 2021-04-08 MED ORDER — ONDANSETRON HCL 4 MG/2ML IJ SOLN
4.0000 mg | Freq: Once | INTRAMUSCULAR | Status: AC
  Administered 2021-04-08: 4 mg via INTRAVENOUS
  Filled 2021-04-08: qty 2

## 2021-04-08 MED ORDER — KETOROLAC TROMETHAMINE 15 MG/ML IJ SOLN
15.0000 mg | Freq: Once | INTRAMUSCULAR | Status: AC
  Administered 2021-04-08: 15 mg via INTRAVENOUS
  Filled 2021-04-08: qty 1

## 2021-04-08 NOTE — ED Triage Notes (Signed)
Patient presents with complaints of right flank pain onset last night around 2000; states took ibuprofen and tylenol with no relief; states took oxycodone x1 @2300  and x 1 at 0000; states had lithotripsy Monday am.

## 2021-04-08 NOTE — ED Provider Notes (Signed)
[facility] Provider Note: [name], MD, FACEP  CSN: [id number] MRN: [id number] ARRIVAL: 04/08/21 at 0208 ROOM: [facility]05/[facility]05   CHIEF COMPLAINT  Flank Pain   HISTORY OF PRESENT ILLNESS  04/08/21 2:25 AM Katie Spencer is a 40 y.o. female with a known proximal 3 mm right ureteral stone that has been present for an extended period of time.  This has occasioned multiple visits to emergency departments during the past several weeks.  She had unsuccessful lithotripsy on April 04, 2021.  She was seen at [facility] for flank pain 2 days ago and a CT revealed the 3 mm stone had not significantly moved in her ureter.  Dr. Charm Barges at that hospital contacted urology who advised that if the stone continues to be recalcitrant they would consider stone retrieval.  The patient is here with right flank pain that began yesterday evening about 8 PM.  She took ibuprofen and Tylenol without relief and then took an oxycodone at 11 PM and another 1 at midnight.  This did not adequately relieve her pain and she still rates it as a 10 out of 10.  She describes it as sharp.  It is somewhat worse with palpation of the right flank.  She has had associated nausea but no vomiting.  She has had some mild hematuria.  She denies dysuria.   Past Medical History:  Diagnosis Date   Abnormal vaginal bleeding    Chronic abdominal pain    Renal disorder     Past Surgical History:  Procedure Laterality Date   ABDOMINAL HYSTERECTOMY     CESAREAN SECTION  2006, 2009, 2013, 2014   CHOLECYSTECTOMY      Family History  Adopted: Yes    Social History   Tobacco Use   Smoking status: Every Day    Packs/day: 1.00    Types: Cigarettes   Smokeless tobacco: Never  Vaping Use   Vaping Use: Every day   Substances: Nicotine  Substance Use Topics   Alcohol use: Not Currently   Drug use: Never    Prior to Admission medications   Medication Sig Start Date End Date Taking? Authorizing Provider   HYDROmorphone (DILAUDID) 2 MG tablet Take 1 tablet (2 mg total) by mouth every 4 (four) hours as needed for severe pain. 04/08/21  Yes Bettyanne Dittman, MD  butalbital-acetaminophen-caffeine (FIORICET) 50-325-40 MG tablet Take 1 tablet by mouth 2 (two) times daily as needed for headache.    [provider]  citalopram (CELEXA) 40 MG tablet Take 40 mg by mouth daily. 01/01/20   [provider]  dicyclomine (BENTYL) 20 MG tablet Take 1 tablet (20 mg total) by mouth every 8 (eight) hours as needed for spasms. 12/03/20   Petrucelli, Samantha R, PA-C  hydrOXYzine (ATARAX/VISTARIL) 25 MG tablet Take 25 mg by mouth 3 (three) times daily as needed for anxiety.  04/30/20   [provider]  lidocaine (XYLOCAINE) 2 % solution Use as directed 15 mLs in the mouth or throat every 6 (six) hours as needed for mouth pain. 03/03/21   Pricilla Loveless, MD  methocarbamol (ROBAXIN) 500 MG tablet Take 500 mg by mouth daily as needed for muscle spasms. 10/05/20   [provider]  MOBIC 15 MG tablet Take 15 mg by mouth daily as needed for pain. 10/05/20   [provider]  ondansetron (ZOFRAN ODT) 4 MG disintegrating tablet Take 1 tablet (4 mg total) by mouth every 8 (eight) hours as needed for nausea or  vomiting. 12/03/20   Petrucelli, Lelon Mast R, PA-C  pantoprazole (PROTONIX) 40 MG tablet Take 1 tablet (40 mg total) by mouth 2 (two) times daily. 10/24/20 11/23/20  Dartha Lodge, PA-C  sucralfate (CARAFATE) 1 GM/10ML suspension Take 10 mLs (1 g total) by mouth 4 (four) times daily -  with meals and at bedtime. 03/03/21   Pricilla Loveless, MD  tamsulosin (FLOMAX) 0.4 MG CAPS capsule Take 1 capsule (0.4 mg total) by mouth daily. 10/24/20   Dartha Lodge, PA-C  VRAYLAR 6 MG CAPS Take 6 mg by mouth at bedtime.  01/02/20   [provider]  belladonna-opium (B&O SUPPRETTES) 16.2-30 MG suppository Place 1 suppository rectally every 8 (eight) hours as needed for pain. Patient not taking: Reported  on 05/27/2020 05/20/20 08/18/20  Gilda Crease, MD  lansoprazole (PREVACID) 30 MG capsule Take 1 capsule daily while taking ibuprofen. Patient not taking: Reported on 05/27/2020 04/16/20 08/18/20  Crockett Rallo, MD    Allergies Morphine and related and Almond (diagnostic)   REVIEW OF SYSTEMS  Negative except as noted here or in the History of Present Illness.   PHYSICAL EXAMINATION  Initial Vital Signs Blood pressure 136/81, pulse 66, temperature (!) 97.2 F (36.2 C), temperature source Oral, resp. rate 16, height 5\' 4"  (1.626 m), weight 104.3 kg, last menstrual period 04/04/2020, SpO2 100 %.  Examination General: Well-developed, well-nourished female in no acute distress; appearance consistent with age of record HENT: normocephalic; atraumatic Eyes: Normal appearance Neck: supple Heart: regular rate and rhythm Lungs: clear to auscultation bilaterally Abdomen: soft; nondistended; nontender; bowel sounds present GU: Right CVA tenderness Extremities: No deformity; full range of motion; pulses normal Neurologic: Awake, alert and oriented; motor function intact in all extremities and symmetric; no facial droop Skin: Warm and dry Psychiatric: Normal mood and affect   RESULTS  Summary of this visit's results, reviewed and interpreted by myself:   EKG Interpretation  Date/Time:    Ventricular Rate:    PR Interval:    QRS Duration:   QT Interval:    QTC Calculation:   R Axis:     Text Interpretation:         Laboratory Studies: Results for orders placed or performed during the hospital encounter of 04/08/21 (from the past 24 hour(s))  Urinalysis, Routine w reflex microscopic Urine, Clean Catch     Status: Abnormal   Collection Time: 04/08/21  2:36 AM  Result Value Ref Range   Color, Urine YELLOW YELLOW   APPearance CLEAR CLEAR   Specific Gravity, Urine <1.005 (L) 1.005 - 1.030   pH 7.0 5.0 - 8.0   Glucose, UA NEGATIVE NEGATIVE mg/dL   Hgb urine dipstick LARGE (A)  NEGATIVE   Bilirubin Urine NEGATIVE NEGATIVE   Ketones, ur NEGATIVE NEGATIVE mg/dL   Protein, ur NEGATIVE NEGATIVE mg/dL   Nitrite NEGATIVE NEGATIVE   Leukocytes,Ua NEGATIVE NEGATIVE  Urinalysis, Microscopic (reflex)     Status: Abnormal   Collection Time: 04/08/21  2:36 AM  Result Value Ref Range   RBC / HPF 11-20 0 - 5 RBC/hpf   WBC, UA 0-5 0 - 5 WBC/hpf   Bacteria, UA FEW (A) NONE SEEN   Squamous Epithelial / LPF 0-5 0 - 5   Imaging Studies: No results found.  ED COURSE and MDM  Nursing notes, initial and subsequent vitals signs, including pulse oximetry, reviewed and interpreted by myself.  Vitals:   04/08/21 0216 04/08/21 0219  BP:  136/81  Pulse:  66  Resp:  16  Temp:  (!) 97.2 F (36.2 C)  TempSrc:  Oral  SpO2:  100%  Weight: 104.3 kg   Height: 5\' 4"  (1.626 m)    Medications  ondansetron (ZOFRAN) injection 4 mg (4 mg Intravenous Given 04/08/21 0245)  ketorolac (TORADOL) 15 MG/ML injection 15 mg (15 mg Intravenous Given 04/08/21 0249)  HYDROmorphone (DILAUDID) injection 1 mg (1 mg Intravenous Given 04/08/21 0250)  HYDROmorphone (DILAUDID) injection 1 mg (1 mg Intravenous Given 04/08/21 0345)   4:49 AM Pain now controlled to a tolerable level.  Patient ready to go home.  PROCEDURES  Procedures   ED DIAGNOSES     ICD-10-CM   1. Ureterolithiasis  N20.1     2. Ureteral colic  N23          Monserratt Knezevic, MD 04/08/21 [phone]

## 2021-04-08 NOTE — ED Triage Notes (Signed)
Patient states drove herself here.

## 2021-04-12 ENCOUNTER — Other Ambulatory Visit: Payer: Self-pay

## 2021-04-12 ENCOUNTER — Encounter (HOSPITAL_BASED_OUTPATIENT_CLINIC_OR_DEPARTMENT_OTHER): Payer: Self-pay | Admitting: *Deleted

## 2021-04-12 ENCOUNTER — Emergency Department (HOSPITAL_BASED_OUTPATIENT_CLINIC_OR_DEPARTMENT_OTHER)
Admission: EM | Admit: 2021-04-12 | Discharge: 2021-04-13 | Disposition: A | Discharge status: Home / Self Care | Payer: Medicaid Other | Attending: Emergency Medicine | Admitting: Emergency Medicine

## 2021-04-12 ENCOUNTER — Emergency Department (HOSPITAL_BASED_OUTPATIENT_CLINIC_OR_DEPARTMENT_OTHER): Payer: Medicaid Other

## 2021-04-12 DIAGNOSIS — R0789 Other chest pain: Secondary | ICD-10-CM | POA: Diagnosis present

## 2021-04-12 DIAGNOSIS — R109 Unspecified abdominal pain: Secondary | ICD-10-CM | POA: Diagnosis not present

## 2021-04-12 DIAGNOSIS — R112 Nausea with vomiting, unspecified: Secondary | ICD-10-CM | POA: Diagnosis not present

## 2021-04-12 DIAGNOSIS — F17210 Nicotine dependence, cigarettes, uncomplicated: Secondary | ICD-10-CM | POA: Diagnosis not present

## 2021-04-12 LAB — CBC WITH DIFFERENTIAL/PLATELET
Abs Immature Granulocytes: 0.09 10*3/uL — ABNORMAL HIGH (ref 0.00–0.07)
Basophils Absolute: 0.1 10*3/uL (ref 0.0–0.1)
Basophils Relative: 1 %
Eosinophils Absolute: 0 10*3/uL (ref 0.0–0.5)
Eosinophils Relative: 0 %
HCT: 35.6 % — ABNORMAL LOW (ref 36.0–46.0)
Hemoglobin: 13.1 g/dL (ref 12.0–15.0)
Immature Granulocytes: 1 %
Lymphocytes Relative: 12 %
Lymphs Abs: 1.6 10*3/uL (ref 0.7–4.0)
MCH: 32.8 pg (ref 26.0–34.0)
MCHC: 36.8 g/dL — ABNORMAL HIGH (ref 30.0–36.0)
MCV: 89.2 fL (ref 80.0–100.0)
Monocytes Absolute: 0.6 10*3/uL (ref 0.1–1.0)
Monocytes Relative: 4 %
Neutro Abs: 10.9 10*3/uL — ABNORMAL HIGH (ref 1.7–7.7)
Neutrophils Relative %: 82 %
Platelets: 287 10*3/uL (ref 150–400)
RBC: 3.99 MIL/uL (ref 3.87–5.11)
RDW: 11.7 % (ref 11.5–15.5)
WBC: 13.2 10*3/uL — ABNORMAL HIGH (ref 4.0–10.5)
nRBC: 0 % (ref 0.0–0.2)

## 2021-04-12 LAB — COMPREHENSIVE METABOLIC PANEL
ALT: 45 U/L — ABNORMAL HIGH (ref 0–44)
AST: 44 U/L — ABNORMAL HIGH (ref 15–41)
Albumin: 4.3 g/dL (ref 3.5–5.0)
Alkaline Phosphatase: 83 U/L (ref 38–126)
Anion gap: 10 (ref 5–15)
BUN: 16 mg/dL (ref 6–20)
CO2: 21 mmol/L — ABNORMAL LOW (ref 22–32)
Calcium: 8.9 mg/dL (ref 8.9–10.3)
Chloride: 102 mmol/L (ref 98–111)
Creatinine, Ser: 0.82 mg/dL (ref 0.44–1.00)
GFR, Estimated: >60 mL/min (ref >60)
Glucose, Bld: 153 mg/dL — ABNORMAL HIGH (ref 70–99)
Potassium: 3.7 mmol/L (ref 3.5–5.1)
Sodium: 133 mmol/L — ABNORMAL LOW (ref 135–145)
Total Bilirubin: 0.9 mg/dL (ref 0.3–1.2)
Total Protein: 7.8 g/dL (ref 6.5–8.1)

## 2021-04-12 LAB — TROPONIN I (HIGH SENSITIVITY)
Troponin I (High Sensitivity): 3 ng/L (ref <18)
Troponin I (High Sensitivity): 3 ng/L (ref <18)

## 2021-04-12 LAB — LIPASE, BLOOD: Lipase: 27 U/L (ref 11–51)

## 2021-04-12 MED ORDER — SODIUM CHLORIDE 0.9 % IV BOLUS
500.0000 mL | Freq: Once | INTRAVENOUS | Status: AC
  Administered 2021-04-12: 500 mL via INTRAVENOUS

## 2021-04-12 MED ORDER — ALUM & MAG HYDROXIDE-SIMETH 200-200-20 MG/5ML PO SUSP
30.0000 mL | Freq: Once | ORAL | Status: AC
  Administered 2021-04-12: 30 mL via ORAL
  Filled 2021-04-12: qty 30

## 2021-04-12 MED ORDER — IOHEXOL 300 MG/ML  SOLN
100.0000 mL | Freq: Once | INTRAMUSCULAR | Status: AC | PRN
  Administered 2021-04-12: 100 mL via INTRAVENOUS

## 2021-04-12 MED ORDER — LIDOCAINE VISCOUS HCL 2 % MT SOLN
15.0000 mL | Freq: Once | OROMUCOSAL | Status: AC
  Administered 2021-04-12: 15 mL via ORAL
  Filled 2021-04-12: qty 15

## 2021-04-12 MED ORDER — ONDANSETRON HCL 4 MG/2ML IJ SOLN
4.0000 mg | Freq: Once | INTRAMUSCULAR | Status: AC
  Administered 2021-04-12: 4 mg via INTRAVENOUS
  Filled 2021-04-12: qty 2

## 2021-04-12 MED ORDER — DICYCLOMINE HCL 10 MG/ML IM SOLN
20.0000 mg | Freq: Once | INTRAMUSCULAR | Status: AC
  Administered 2021-04-12: 20 mg via INTRAMUSCULAR
  Filled 2021-04-12: qty 2

## 2021-04-12 MED ORDER — KETOROLAC TROMETHAMINE 30 MG/ML IJ SOLN
30.0000 mg | Freq: Once | INTRAMUSCULAR | Status: AC
  Administered 2021-04-12: 30 mg via INTRAVENOUS
  Filled 2021-04-12: qty 1

## 2021-04-12 NOTE — ED Provider Notes (Signed)
Emergency Department Provider Note   I have reviewed the triage vital signs and the nursing notes.   HISTORY  Chief Complaint Chest Pain   HPI Katie Spencer is a 40 y.o. female with past history reviewed below including abdominal hysterectomy, cholecystectomy, history of pancreatitis presents to the emergency department with abdominal and chest discomfort.  Patient notes a recent ureteral stone status post lithotripsy.  She states the pain associated with that episode seems to be improving.  She had a plain film showing partial resolution of her ureteral stone at the urologist.  She is developed some abdominal pain with nausea vomiting.  She has some burning central chest pain with no clear modifying factors.  Symptoms worsen to the point where she called her GI doctor who advised she come to the emergency department for further evaluation.  No fevers or chills.  No urinary symptoms.    Past Medical History:  Diagnosis Date   Abnormal vaginal bleeding    Chronic abdominal pain    Renal disorder     Patient Active Problem List   Diagnosis Date Noted   Acute bilateral low back pain without sciatica 01/10/2021   Lumbar radiculopathy 10/06/2020   Wrist sprain, left, subsequent encounter 09/22/2020    Past Surgical History:  Procedure Laterality Date   ABDOMINAL HYSTERECTOMY     CESAREAN SECTION  2006, 2009, 2013, 2014   CHOLECYSTECTOMY      Allergies Morphine and related and Almond (diagnostic)  Family History  Adopted: Yes    Social History Social History   Tobacco Use   Smoking status: Every Day    Packs/day: 1.00    Types: Cigarettes   Smokeless tobacco: Never  Vaping Use   Vaping Use: Every day   Substances: Nicotine  Substance Use Topics   Alcohol use: Not Currently   Drug use: Never    Review of Systems  Constitutional: No fever/chills Eyes: No visual changes. ENT: No sore throat. Cardiovascular: Positive chest pain. Respiratory: Denies  shortness of breath. Gastrointestinal: Positive abdominal pain. Positive nausea and vomiting.  No diarrhea.  No constipation. Genitourinary: Negative for dysuria. Musculoskeletal: Negative for back pain. Skin: Negative for rash. Neurological: Negative for headaches, focal weakness or   10-point ROS otherwise negative.  ____________________________________________   PHYSICAL EXAM:  VITAL SIGNS: ED Triage Vitals  Enc Vitals Group     BP 04/12/21 1714 131/84     Pulse Rate 04/12/21 1714 83     Resp 04/12/21 1714 20     Temp 04/12/21 1714 99 F (37.2 C)     Temp Source 04/12/21 1714 Oral     SpO2 04/12/21 1714 97 %     Weight 04/12/21 1712 229 lb 15 oz (104.3 kg)     Height 04/12/21 1712 5\' 4"  (1.626 m)   Constitutional: Alert and oriented. Well appearing and in no acute distress. Eyes: Conjunctivae are normal.  Head: Atraumatic. Nose: No congestion/rhinnorhea. Mouth/Throat: Mucous membranes are slightly dry.  Neck: No stridor.   Cardiovascular: Normal rate, regular rhythm. Good peripheral circulation. Grossly normal heart sounds.   Respiratory: Normal respiratory effort.  No retractions. Lungs CTAB. Gastrointestinal: Soft with diffuse tenderness. No rebound. No distention.  Musculoskeletal: No lower extremity tenderness nor edema. No gross deformities of extremities. Neurologic:  Normal speech and language. No gross focal neurologic deficits are appreciated.  Skin:  Skin is warm, dry and intact. No rash noted.  ____________________________________________   LABS (all labs ordered are listed, but only abnormal results  are displayed)  Labs Reviewed  COMPREHENSIVE METABOLIC PANEL - Abnormal; Notable for the following components:      Result Value   Sodium 133 (*)    CO2 21 (*)    Glucose, Bld 153 (*)    AST 44 (*)    ALT 45 (*)    All other components within normal limits  CBC WITH DIFFERENTIAL/PLATELET - Abnormal; Notable for the following components:   WBC 13.2 (*)     HCT 35.6 (*)    MCHC 36.8 (*)    Neutro Abs 10.9 (*)    Abs Immature Granulocytes 0.09 (*)    All other components within normal limits  URINALYSIS, ROUTINE W REFLEX MICROSCOPIC - Abnormal; Notable for the following components:   Hgb urine dipstick TRACE (*)    All other components within normal limits  URINALYSIS, MICROSCOPIC (REFLEX) - Abnormal; Notable for the following components:   Bacteria, UA RARE (*)    All other components within normal limits  LIPASE, BLOOD  TROPONIN I (HIGH SENSITIVITY)  TROPONIN I (HIGH SENSITIVITY)   ____________________________________________  EKG   EKG Interpretation  Date/Time:  Tuesday April 12 2021 19:32:25 EDT Ventricular Rate:  62 PR Interval:  90 QRS Duration: 97 QT Interval:  424 QTC Calculation: 431 R Axis:   62 Text Interpretation: Sinus rhythm Short PR interval Confirmed by Alona Bene [id number]) on 04/12/2021 7:36:42 PM        ____________________________________________  RADIOLOGY  CT ABDOMEN PELVIS W CONTRAST  Result Date: 04/12/2021 CLINICAL DATA:  Chest and abdominal pain for 3 days, history of pancreatitis EXAM: CT ABDOMEN AND PELVIS WITH CONTRAST TECHNIQUE: Multidetector CT imaging of the abdomen and pelvis was performed using the standard protocol following bolus administration of intravenous contrast. CONTRAST:  OMNIPAQUE IOHEXOL 300 MG/ML  SOLN COMPARISON:  Radiograph 01/25/2021, CT 01/09/2021, 10/24/2020 FINDINGS: Lower chest: Lung bases are clear. Normal heart size. No pericardial effusion. Hepatobiliary: Diffuse hepatic hypoattenuation compatible with hepatic steatosis. Smooth liver surface contour. No concerning focal liver lesion. Prior cholecystectomy. No biliary dilatation or intraductal gallstones. Pancreas: No pancreatic ductal dilatation or surrounding inflammatory changes. Spleen: Normal in size. No concerning splenic lesions. Adrenals/Urinary Tract: Normal adrenals. Kidneys enhance and excrete symmetrically.  No concerning focal renal lesion. Slight asymmetric prominence of the right collecting system with perivesicular and parapelvic haze as well as some mild hazy perivesicular stranding and more nonspecific bladder wall thickening possibly related to underdistention left urinary tract has a more unremarkable appearance. Stomach/Bowel: Distal esophagus, stomach and duodenum are unremarkable. No small bowel thickening or dilatation. Extensive intramural fatty infiltration of the colon may reflect sequela prior inflammatory bowel change or a lipomatous body habitus. No clear acute colonic thickening or dilatation. Scattered distal colonic diverticula without evidence of acute diverticulitis. A normal appendix is visualized. No evidence of bowel obstruction. Vascular/Lymphatic: No significant vascular findings are present. No enlarged abdominal or pelvic lymph nodes. Reproductive: Uterus is surgically absent. No concerning adnexal lesions. Other: Remote postsurgical changes of the low anterior abdominal wall. No abdominopelvic free air or fluid. No bowel containing hernia. Musculoskeletal: Minimal degenerative changes most pronounced at the L5-S1 level. No acute osseous abnormality or suspicious osseous lesion. IMPRESSION: Slight asymmetry of the right renal collecting system with mild asymmetric ureterectasis as well as some faint parapelvic and periureteral stranding. No frank hydronephrosis or obstructing calculus is seen this time. Slightly more nonspecific bladder wall thickening given underdistention. Recommend correlation with urinalysis to exclude an ascending tract infection versus recently passed calculus.  Intramural fatty infiltration of the colon, can be seen as sequela of prior inflammatory bowel disease or a lipomatous body habitus. Colonic diverticulosis without evidence of acute diverticulitis. Hepatic steatosis Aortic Atherosclerosis (ICD10-I70.0). Electronically Signed   By: Kreg Shropshire M.D.   On:  04/12/2021 21:52    ____________________________________________   PROCEDURES  Procedure(s) performed:   Procedures  None  ____________________________________________   INITIAL IMPRESSION / ASSESSMENT AND PLAN / ED COURSE  Pertinent labs & imaging results that were available during my care of the patient were reviewed by me and considered in my medical decision making (see chart for details).   Patient presents to the emergency department with chest and abdominal pain along with nausea vomiting.  Has history of recent lithotripsy as well as pancreatitis history.  Chest pain is burning in quality and low suspicion for ACS or PE.  Plan for screening lab work.  She does have diffuse tenderness on abdominal exam.  Plan for CT imaging of the abdomen and pelvis and reassess.   CT imaging reviewed. UA is pending. Patient reassessed and feeling better after meds. Also being treated for H. Pylori which may be causing symptoms here as well. UA pending and case transferred to Dr. Judd Lien.  ____________________________________________  FINAL CLINICAL IMPRESSION(S) / ED DIAGNOSES  Final diagnoses:  Atypical chest pain     MEDICATIONS GIVEN DURING THIS VISIT:  Medications  sodium chloride 0.9 % bolus 500 mL (0 mLs Intravenous Stopped 04/12/21 2144)  ondansetron (ZOFRAN) injection 4 mg (4 mg Intravenous Given 04/12/21 2020)  ketorolac (TORADOL) 30 MG/ML injection 30 mg (30 mg Intravenous Given 04/12/21 2021)  iohexol (OMNIPAQUE) 300 MG/ML solution 100 mL (100 mLs Intravenous Contrast Given 04/12/21 2117)  dicyclomine (BENTYL) injection 20 mg (20 mg Intramuscular Given 04/12/21 2133)  sodium chloride 0.9 % bolus 500 mL (0 mLs Intravenous Stopped 04/13/21 0003)  alum & mag hydroxide-simeth (MAALOX/MYLANTA) 200-200-20 MG/5ML suspension 30 mL (30 mLs Oral Given 04/12/21 2257)    And  lidocaine (XYLOCAINE) 2 % viscous mouth solution 15 mL (15 mLs Oral Given 04/12/21 2257)    Note:  This document was  prepared using Dragon voice recognition software and may include unintentional dictation errors.  Alona Bene, MD, [facility] Emergency Medicine    Flynn Lininger, Arlyss Repress, MD 04/13/21 [phone]

## 2021-04-12 NOTE — ED Triage Notes (Addendum)
Chest and abdominal pain x 3 days. Vomiting. Hx of pancreatitis. States she had a kidney stone 4 days ago. She passed the stone. She was seen by her urologist today and told to take Tylenol for the pain.

## 2021-04-12 NOTE — ED Notes (Signed)
Pt is aware she needs a urine sample 

## 2021-04-12 NOTE — ED Notes (Signed)
Patient transported to CT 

## 2021-04-13 LAB — URINALYSIS, ROUTINE W REFLEX MICROSCOPIC
Bilirubin Urine: NEGATIVE
Glucose, UA: NEGATIVE mg/dL
Ketones, ur: NEGATIVE mg/dL
Leukocytes,Ua: NEGATIVE
Nitrite: NEGATIVE
Protein, ur: NEGATIVE mg/dL
Specific Gravity, Urine: 1.01 (ref 1.005–1.030)
pH: 7 (ref 5.0–8.0)

## 2021-04-13 LAB — URINALYSIS, MICROSCOPIC (REFLEX)

## 2021-04-13 NOTE — Discharge Instructions (Addendum)
Follow-up with your primary doctor in the next 3 to 4 days.  Continue medications as previously prescribed.

## 2021-04-13 NOTE — ED Provider Notes (Signed)
  Physical Exam  BP 111/72   Pulse 65   Temp 99 F (37.2 C) (Oral)   Resp 13   Ht 5\' 4"  (1.626 m)   Wt 104.3 kg   LMP 04/04/2020 Comment: neg preg test  SpO2 97%   BMI 39.47 kg/m   Physical Exam  ED Course/Procedures     Procedures  MDM  Care assumed from Dr. 04/06/2020 at shift change.  Patient is awaiting results of urinalysis.  This has returned and is clear.  Patient's laboratory studies reviewed and seems appropriate for discharge.  Patient has history of frequent visits involving abdominal pain that seem similar to this presentation.  I see nothing emergent and will refer her back to her primary doctor.       Jacqulyn Bath, MD 04/13/21 [phone]

## 2021-04-13 NOTE — ED Notes (Signed)
Report to Megan RN

## 2021-04-27 ENCOUNTER — Encounter (HOSPITAL_COMMUNITY): Payer: Self-pay

## 2021-04-27 ENCOUNTER — Emergency Department (HOSPITAL_COMMUNITY): Payer: Medicaid Other

## 2021-04-27 ENCOUNTER — Emergency Department (HOSPITAL_COMMUNITY)
Admission: EM | Admit: 2021-04-27 | Discharge: 2021-04-27 | Disposition: A | Discharge status: Home / Self Care | Payer: Medicaid Other | Attending: Emergency Medicine | Admitting: Emergency Medicine

## 2021-04-27 ENCOUNTER — Other Ambulatory Visit: Payer: Self-pay

## 2021-04-27 DIAGNOSIS — R202 Paresthesia of skin: Secondary | ICD-10-CM | POA: Insufficient documentation

## 2021-04-27 DIAGNOSIS — M549 Dorsalgia, unspecified: Secondary | ICD-10-CM | POA: Insufficient documentation

## 2021-04-27 DIAGNOSIS — R519 Headache, unspecified: Secondary | ICD-10-CM | POA: Diagnosis present

## 2021-04-27 DIAGNOSIS — M25552 Pain in left hip: Secondary | ICD-10-CM | POA: Diagnosis not present

## 2021-04-27 DIAGNOSIS — Z20822 Contact with and (suspected) exposure to covid-19: Secondary | ICD-10-CM | POA: Insufficient documentation

## 2021-04-27 DIAGNOSIS — M25561 Pain in right knee: Secondary | ICD-10-CM | POA: Diagnosis not present

## 2021-04-27 DIAGNOSIS — M25511 Pain in right shoulder: Secondary | ICD-10-CM | POA: Insufficient documentation

## 2021-04-27 DIAGNOSIS — M25562 Pain in left knee: Secondary | ICD-10-CM | POA: Diagnosis not present

## 2021-04-27 DIAGNOSIS — M25551 Pain in right hip: Secondary | ICD-10-CM | POA: Insufficient documentation

## 2021-04-27 DIAGNOSIS — G43909 Migraine, unspecified, not intractable, without status migrainosus: Secondary | ICD-10-CM | POA: Insufficient documentation

## 2021-04-27 DIAGNOSIS — T7840XA Allergy, unspecified, initial encounter: Secondary | ICD-10-CM

## 2021-04-27 DIAGNOSIS — T492X5A Adverse effect of local astringents and local detergents, initial encounter: Secondary | ICD-10-CM | POA: Insufficient documentation

## 2021-04-27 DIAGNOSIS — M542 Cervicalgia: Secondary | ICD-10-CM | POA: Insufficient documentation

## 2021-04-27 DIAGNOSIS — G43809 Other migraine, not intractable, without status migrainosus: Secondary | ICD-10-CM

## 2021-04-27 DIAGNOSIS — M25512 Pain in left shoulder: Secondary | ICD-10-CM | POA: Diagnosis not present

## 2021-04-27 DIAGNOSIS — M2550 Pain in unspecified joint: Secondary | ICD-10-CM

## 2021-04-27 DIAGNOSIS — F17210 Nicotine dependence, cigarettes, uncomplicated: Secondary | ICD-10-CM | POA: Insufficient documentation

## 2021-04-27 LAB — SARS CORONAVIRUS 2 (TAT 6-24 HRS): SARS Coronavirus 2: NEGATIVE

## 2021-04-27 MED ORDER — IBUPROFEN 200 MG PO TABS
600.0000 mg | ORAL_TABLET | Freq: Once | ORAL | Status: AC
  Administered 2021-04-27: 600 mg via ORAL
  Filled 2021-04-27: qty 3

## 2021-04-27 MED ORDER — DEXAMETHASONE SODIUM PHOSPHATE 10 MG/ML IJ SOLN
10.0000 mg | Freq: Once | INTRAMUSCULAR | Status: AC
  Administered 2021-04-27: 10 mg via INTRAMUSCULAR
  Filled 2021-04-27: qty 1

## 2021-04-27 MED ORDER — FAMOTIDINE 20 MG PO TABS
20.0000 mg | ORAL_TABLET | Freq: Once | ORAL | Status: AC
  Administered 2021-04-27: 20 mg via ORAL
  Filled 2021-04-27: qty 1

## 2021-04-27 MED ORDER — NAPROXEN 500 MG PO TABS
500.0000 mg | ORAL_TABLET | Freq: Two times a day (BID) | ORAL | 0 refills | Status: DC
Start: 2021-04-27 — End: 2021-08-29

## 2021-04-27 NOTE — ED Triage Notes (Signed)
Pt states that she has had generalized body aches x 3 days.

## 2021-04-27 NOTE — ED Provider Notes (Signed)
[facility]-EMERGENCY DEPT Provider Note   CSN: [id number] Arrival date & time: 04/27/21  0009     History Chief Complaint  Patient presents with   Generalized Body Aches    Katie Spencer is a 40 y.o. female.  Patient to ED for evaluation of possible allergic reaction after using a hotel bath soap earlier today. She describes generalized "pins and needles" tingling and itching after use, without SOB, throat swelling or rash. She also reports 2 days of generalized joint pain including knees, hips, back, neck, shoulders, without swelling or redness. No history of similar symptoms. Single ibuprofen without relief. No fever. She also reports onset of typical migraine after arrival to the ED. No nausea or vomiting.   The history is provided by the patient. No language interpreter was used.      Past Medical History:  Diagnosis Date   Abnormal vaginal bleeding    Chronic abdominal pain    Renal disorder     Patient Active Problem List   Diagnosis Date Noted   Acute bilateral low back pain without sciatica 01/10/2021   Lumbar radiculopathy 10/06/2020   Wrist sprain, left, subsequent encounter 09/22/2020    Past Surgical History:  Procedure Laterality Date   ABDOMINAL HYSTERECTOMY     CESAREAN SECTION  2006, 2009, 2013, 2014   CHOLECYSTECTOMY       OB History   No obstetric history on file.     Family History  Adopted: Yes    Social History   Tobacco Use   Smoking status: Every Day    Packs/day: 1.00    Types: Cigarettes   Smokeless tobacco: Never  Vaping Use   Vaping Use: Every day   Substances: Nicotine  Substance Use Topics   Alcohol use: Not Currently   Drug use: Never    Home Medications Prior to Admission medications   Medication Sig Start Date End Date Taking? Authorizing Provider  butalbital-acetaminophen-caffeine (FIORICET) 50-325-40 MG tablet Take 1 tablet by mouth 2 (two) times daily as needed for headache.    [provider]  citalopram (CELEXA) 40 MG tablet Take 40 mg by mouth daily. 01/01/20   [provider]  dicyclomine (BENTYL) 20 MG tablet Take 1 tablet (20 mg total) by mouth every 8 (eight) hours as needed for spasms. 12/03/20   Petrucelli, Samantha R, PA-C  HYDROmorphone (DILAUDID) 2 MG tablet Take 1 tablet (2 mg total) by mouth every 4 (four) hours as needed for severe pain. 04/08/21   Molpus, John, MD  hydrOXYzine (ATARAX/VISTARIL) 25 MG tablet Take 25 mg by mouth 3 (three) times daily as needed for anxiety.  04/30/20   [provider]  lidocaine (XYLOCAINE) 2 % solution Use as directed 15 mLs in the mouth or throat every 6 (six) hours as needed for mouth pain. 03/03/21   Pricilla Loveless, MD  methocarbamol (ROBAXIN) 500 MG tablet Take 500 mg by mouth daily as needed for muscle spasms. 10/05/20   [provider]  MOBIC 15 MG tablet Take 15 mg by mouth daily as needed for pain. 10/05/20   [provider]  ondansetron (ZOFRAN ODT) 4 MG disintegrating tablet Take 1 tablet (4 mg total) by mouth every 8 (eight) hours as needed for nausea or vomiting. 12/03/20   Petrucelli, Samantha R, PA-C  pantoprazole (PROTONIX) 40 MG tablet Take 1 tablet (40 mg total) by mouth 2 (two) times daily. 10/24/20 11/23/20  Dartha Lodge, PA-C  sucralfate (CARAFATE) 1 GM/10ML suspension Take  10 mLs (1 g total) by mouth 4 (four) times daily -  with meals and at bedtime. 03/03/21   Pricilla Loveless, MD  tamsulosin (FLOMAX) 0.4 MG CAPS capsule Take 1 capsule (0.4 mg total) by mouth daily. 10/24/20   Dartha Lodge, PA-C  VRAYLAR 6 MG CAPS Take 6 mg by mouth at bedtime.  01/02/20   [provider]  belladonna-opium (B&O SUPPRETTES) 16.2-30 MG suppository Place 1 suppository rectally every 8 (eight) hours as needed for pain. Patient not taking: Reported on 05/27/2020 05/20/20 08/18/20  Gilda Crease, MD  lansoprazole (PREVACID) 30 MG capsule Take 1 capsule daily while taking ibuprofen. Patient  not taking: Reported on 05/27/2020 04/16/20 08/18/20  Molpus, Jonny Ruiz, MD    Allergies    Morphine and related and Almond (diagnostic)  Review of Systems   Review of Systems  Constitutional:  Negative for chills and fever.  HENT: Negative.  Negative for trouble swallowing and voice change.   Respiratory: Negative.  Negative for shortness of breath and wheezing.   Cardiovascular: Negative.  Negative for chest pain.  Gastrointestinal: Negative.  Negative for abdominal pain, nausea and vomiting.  Musculoskeletal:  Positive for arthralgias.  Skin: Negative.  Negative for rash.  Neurological:  Positive for headaches. Negative for weakness.       C/o Paresthesias   Physical Exam Updated Vital Signs BP 131/83 (BP Location: Left Arm)   Pulse 81   Temp 98.8 F (37.1 C) (Oral)   Resp 18   Ht 5\' 4"  (1.626 m)   Wt 103.9 kg   LMP 04/04/2020 Comment: neg preg test  SpO2 97%   BMI 39.31 kg/m   Physical Exam Vitals and nursing note reviewed.  Constitutional:      Appearance: She is well-developed.  HENT:     Head: Normocephalic.     Nose: Nose normal.     Mouth/Throat:     Mouth: Mucous membranes are moist.     Comments: No swelling of lips or tongue. Eyes:     Pupils: Pupils are equal, round, and reactive to light.  Cardiovascular:     Rate and Rhythm: Normal rate and regular rhythm.     Heart sounds: No murmur heard. Pulmonary:     Effort: Pulmonary effort is normal.     Breath sounds: Normal breath sounds. No wheezing, rhonchi or rales.  Abdominal:     General: Bowel sounds are normal.     Palpations: Abdomen is soft.     Tenderness: There is no abdominal tenderness. There is no guarding or rebound.  Musculoskeletal:        General: Normal range of motion.     Cervical back: Normal range of motion and neck supple.     Comments: No joint swelling or redness. FROM all extremities.  Skin:    General: Skin is warm and dry.     Findings: No erythema or rash.  Neurological:      General: No focal deficit present.     Mental Status: She is alert and oriented to person, place, and time.     Sensory: No sensory deficit.    ED Results / Procedures / Treatments   Labs (all labs ordered are listed, but only abnormal results are displayed) Labs Reviewed - No data to display  EKG EKG Interpretation  Date/Time:  Wednesday April 27 2021 01:38:35 EDT Ventricular Rate:  76 PR Interval:  143 QRS Duration: 99 QT Interval:  396 QTC Calculation: 446 R Axis:  44 Text Interpretation: Sinus rhythm Normal ECG When compared with ECG of 04/12/2021, No significant change was found Confirmed by Dione Booze ([id number]) on 04/27/2021 1:45:42 AM  Radiology DG Chest 2 View  Result Date: 04/27/2021 CLINICAL DATA:  Shortness of breath EXAM: CHEST - 2 VIEW COMPARISON:  02/07/2021 FINDINGS: The heart size and mediastinal contours are within normal limits. Both lungs are clear. The visualized skeletal structures are unremarkable. IMPRESSION: No active cardiopulmonary disease. Electronically Signed   By: Charlett Nose M.D.   On: 04/27/2021 00:34    Procedures Procedures   Medications Ordered in ED Medications - No data to display  ED Course  I have reviewed the triage vital signs and the nursing notes.  Pertinent labs & imaging results that were available during my care of the patient were reviewed by me and considered in my medical decision making (see chart for details).    MDM Rules/Calculators/A&P                           Patient to ED with multiple complaints as outlined in the HPI.   She is overall well appearing. No evidence of acute serious allergic reaction. Recommend Benadryl and Pepcid. Joint pain nonspecific - no evidence sepsis, inflammation, no fever. Treat symptomatically. Migraine HA typical for her. No neurologic deficit. IM Decadron provided.   Stable for discharge home. COVID send out test collected and is pending at time of discharge   Final Clinical  Impression(s) / ED Diagnoses Final diagnoses:  None   Adverse reaction to soap Migraine Arthralgias   Rx / DC Orders ED Discharge Orders     None        Elpidio Anis, PA-C 04/27/21 0439    Dione Booze, MD 04/27/21 [phone]

## 2021-04-27 NOTE — ED Notes (Signed)
Pt ambulatory in ED lobby. 

## 2021-04-27 NOTE — Discharge Instructions (Addendum)
Take Naproxen for the generalized joint pain. Take Pepcid and benadryl for generalized tingling sensation following using a new soap. Return with any serious or worsening symptoms.  Keep your scheduled appointment with Dr. Leonette Most.

## 2021-05-15 ENCOUNTER — Encounter (HOSPITAL_COMMUNITY): Payer: Self-pay

## 2021-05-15 ENCOUNTER — Emergency Department (HOSPITAL_COMMUNITY)
Admission: EM | Admit: 2021-05-15 | Discharge: 2021-05-16 | Disposition: A | Discharge status: Home / Self Care | Payer: Medicaid Other | Attending: Emergency Medicine | Admitting: Emergency Medicine

## 2021-05-15 ENCOUNTER — Other Ambulatory Visit: Payer: Self-pay

## 2021-05-15 DIAGNOSIS — R109 Unspecified abdominal pain: Secondary | ICD-10-CM | POA: Diagnosis present

## 2021-05-15 DIAGNOSIS — F17210 Nicotine dependence, cigarettes, uncomplicated: Secondary | ICD-10-CM | POA: Insufficient documentation

## 2021-05-15 DIAGNOSIS — R112 Nausea with vomiting, unspecified: Secondary | ICD-10-CM | POA: Diagnosis not present

## 2021-05-15 MED ORDER — DICYCLOMINE HCL 10 MG PO CAPS
20.0000 mg | ORAL_CAPSULE | Freq: Once | ORAL | Status: AC
  Administered 2021-05-15: 20 mg via ORAL
  Filled 2021-05-15: qty 2

## 2021-05-15 MED ORDER — ONDANSETRON 4 MG PO TBDP
4.0000 mg | ORAL_TABLET | Freq: Once | ORAL | Status: AC
  Administered 2021-05-15: 4 mg via ORAL
  Filled 2021-05-15: qty 1

## 2021-05-15 MED ORDER — KETOROLAC TROMETHAMINE 30 MG/ML IJ SOLN
30.0000 mg | Freq: Once | INTRAMUSCULAR | Status: AC
  Administered 2021-05-15: 30 mg via INTRAMUSCULAR
  Filled 2021-05-15: qty 1

## 2021-05-15 NOTE — ED Provider Notes (Signed)
[facility]-EMERGENCY DEPT Provider Note   CSN: [id number] Arrival date & time: 05/15/21  1952     History Chief Complaint  Patient presents with   Abdominal Pain   Nephrolithiasis    Katie Spencer is a 40 y.o. female.  Patient with history of kidney stones, recurrent abdominal pain, cholecystectomy presents with onset of nausea and NBNB vomiting since around 6:00 pm. She feels she may have eaten something that was bad that is causing symptoms. Pain is generalized abdomen, worse across the upper portion. No change in bowel movements. No fever, chest pain, SOB. She also reports onset right flank pain tonight, similar to multiple previous kidney stones. "It feels like I got hit with a sledge hammer in the back". Last Tylenol and ibuprofen around 6 pm (5.5 hours ago). No hematuria.   The history is provided by the patient. No language interpreter was used.  Abdominal Pain Associated symptoms: nausea and vomiting   Associated symptoms: no constipation, no diarrhea, no fever and no hematuria       Past Medical History:  Diagnosis Date   Abnormal vaginal bleeding    Chronic abdominal pain    Renal disorder     Patient Active Problem List   Diagnosis Date Noted   Acute bilateral low back pain without sciatica 01/10/2021   Lumbar radiculopathy 10/06/2020   Wrist sprain, left, subsequent encounter 09/22/2020    Past Surgical History:  Procedure Laterality Date   ABDOMINAL HYSTERECTOMY     CESAREAN SECTION  2006, 2009, 2013, 2014   CHOLECYSTECTOMY       OB History   No obstetric history on file.     Family History  Adopted: Yes    Social History   Tobacco Use   Smoking status: Every Day    Packs/day: 1.00    Types: Cigarettes   Smokeless tobacco: Never  Vaping Use   Vaping Use: Every day   Substances: Nicotine  Substance Use Topics   Alcohol use: Not Currently   Drug use: Never    Home Medications Prior to Admission medications    Medication Sig Start Date End Date Taking? Authorizing Provider  butalbital-acetaminophen-caffeine (FIORICET) 50-325-40 MG tablet Take 1 tablet by mouth 2 (two) times daily as needed for headache.    [provider]  citalopram (CELEXA) 40 MG tablet Take 40 mg by mouth daily. 01/01/20   [provider]  dicyclomine (BENTYL) 20 MG tablet Take 1 tablet (20 mg total) by mouth every 8 (eight) hours as needed for spasms. 12/03/20   Petrucelli, Samantha R, PA-C  HYDROmorphone (DILAUDID) 2 MG tablet Take 1 tablet (2 mg total) by mouth every 4 (four) hours as needed for severe pain. 04/08/21   Molpus, John, MD  hydrOXYzine (ATARAX/VISTARIL) 25 MG tablet Take 25 mg by mouth 3 (three) times daily as needed for anxiety.  04/30/20   [provider]  lidocaine (XYLOCAINE) 2 % solution Use as directed 15 mLs in the mouth or throat every 6 (six) hours as needed for mouth pain. 03/03/21   Pricilla Loveless, MD  methocarbamol (ROBAXIN) 500 MG tablet Take 500 mg by mouth daily as needed for muscle spasms. 10/05/20   [provider]  MOBIC 15 MG tablet Take 15 mg by mouth daily as needed for pain. 10/05/20   [provider]  naproxen (NAPROSYN) 500 MG tablet Take 1 tablet (500 mg total) by mouth 2 (two) times daily. 04/27/21   Elpidio Anis, PA-C  ondansetron [facility]  ODT) 4 MG disintegrating tablet Take 1 tablet (4 mg total) by mouth every 8 (eight) hours as needed for nausea or vomiting. 12/03/20   Petrucelli, Samantha R, PA-C  pantoprazole (PROTONIX) 40 MG tablet Take 1 tablet (40 mg total) by mouth 2 (two) times daily. 10/24/20 11/23/20  Dartha Lodge, PA-C  sucralfate (CARAFATE) 1 GM/10ML suspension Take 10 mLs (1 g total) by mouth 4 (four) times daily -  with meals and at bedtime. 03/03/21   Pricilla Loveless, MD  tamsulosin (FLOMAX) 0.4 MG CAPS capsule Take 1 capsule (0.4 mg total) by mouth daily. 10/24/20   Dartha Lodge, PA-C  VRAYLAR 6 MG CAPS Take 6 mg by mouth at bedtime.  01/02/20    [provider]  belladonna-opium (B&O SUPPRETTES) 16.2-30 MG suppository Place 1 suppository rectally every 8 (eight) hours as needed for pain. Patient not taking: Reported on 05/27/2020 05/20/20 08/18/20  Gilda Crease, MD  lansoprazole (PREVACID) 30 MG capsule Take 1 capsule daily while taking ibuprofen. Patient not taking: Reported on 05/27/2020 04/16/20 08/18/20  Molpus, Jonny Ruiz, MD    Allergies    Morphine and related and Almond (diagnostic)  Review of Systems   Review of Systems  Constitutional:  Negative for fever.  HENT: Negative.    Respiratory: Negative.    Cardiovascular: Negative.   Gastrointestinal:  Positive for abdominal pain, nausea and vomiting. Negative for constipation and diarrhea.  Genitourinary:  Positive for flank pain. Negative for hematuria.  Musculoskeletal:  Negative for myalgias.  Skin:  Negative for color change.  Neurological:  Negative for weakness.   Physical Exam Updated Vital Signs BP (!) 159/92   Pulse 93   Temp 97.9 F (36.6 C) (Oral)   Resp 16   LMP 04/04/2020 Comment: neg preg test  SpO2 100%   Physical Exam Vitals and nursing note reviewed.  Constitutional:      General: She is not in acute distress.    Appearance: She is well-developed. She is obese. She is not ill-appearing.  HENT:     Head: Normocephalic.  Cardiovascular:     Rate and Rhythm: Normal rate and regular rhythm.     Heart sounds: No murmur heard. Pulmonary:     Effort: Pulmonary effort is normal.     Breath sounds: Normal breath sounds. No wheezing, rhonchi or rales.  Abdominal:     General: Bowel sounds are normal.     Palpations: Abdomen is soft.     Tenderness: There is abdominal tenderness in the right upper quadrant, epigastric area and left upper quadrant. There is right CVA tenderness. There is no guarding or rebound.     Comments: BS normally active throughout abdomen  Musculoskeletal:        General: Normal range of motion.     Cervical back:  Normal range of motion and neck supple.  Skin:    General: Skin is warm and dry.  Neurological:     General: No focal deficit present.     Mental Status: She is alert and oriented to person, place, and time.    ED Results / Procedures / Treatments   Labs (all labs ordered are listed, but only abnormal results are displayed) Labs Reviewed  CBC WITH DIFFERENTIAL/PLATELET  URINALYSIS, ROUTINE W REFLEX MICROSCOPIC  I-STAT CHEM 8, ED  I-STAT BETA HCG BLOOD, ED ([facility], [facility], [facility] ONLY)   Results for orders placed or performed during the hospital encounter of 05/15/21  CBC with Differential/Platelet  Result Value Ref Range  WBC 10.3 4.0 - 10.5 K/uL   RBC 3.51 (L) 3.87 - 5.11 MIL/uL   Hemoglobin 11.6 (L) 12.0 - 15.0 g/dL   HCT 16.133.2 (L) 09.636.0 - 04.546.0 %   MCV 94.6 80.0 - 100.0 fL   MCH 33.0 26.0 - 34.0 pg   MCHC 34.9 30.0 - 36.0 g/dL   RDW 40.912.1 81.111.5 - 91.415.5 %   Platelets 213 150 - 400 K/uL   nRBC 0.0 0.0 - 0.2 %   Neutrophils Relative % 61 %   Neutro Abs 6.4 1.7 - 7.7 K/uL   Lymphocytes Relative 29 %   Lymphs Abs 3.0 0.7 - 4.0 K/uL   Monocytes Relative 6 %   Monocytes Absolute 0.6 0.1 - 1.0 K/uL   Eosinophils Relative 2 %   Eosinophils Absolute 0.2 0.0 - 0.5 K/uL   Basophils Relative 0 %   Basophils Absolute 0.0 0.0 - 0.1 K/uL   Immature Granulocytes 2 %   Abs Immature Granulocytes 0.15 (H) 0.00 - 0.07 K/uL  Urinalysis, Routine w reflex microscopic  Result Value Ref Range   Color, Urine YELLOW YELLOW   APPearance CLOUDY (A) CLEAR   Specific Gravity, Urine 1.027 1.005 - 1.030   pH 6.0 5.0 - 8.0   Glucose, UA 150 (A) NEGATIVE mg/dL   Hgb urine dipstick MODERATE (A) NEGATIVE   Bilirubin Urine NEGATIVE NEGATIVE   Ketones, ur 5 (A) NEGATIVE mg/dL   Protein, ur NEGATIVE NEGATIVE mg/dL   Nitrite NEGATIVE NEGATIVE   Leukocytes,Ua NEGATIVE NEGATIVE   RBC / HPF >50 (H) 0 - 5 RBC/hpf   WBC, UA 0-5 0 - 5 WBC/hpf   Bacteria, UA RARE (A) NONE SEEN   Squamous Epithelial / LPF 11-20 0 - 5    Mucus PRESENT    Ca Oxalate Crys, UA PRESENT   hCG, quantitative, pregnancy  Result Value Ref Range   hCG, Beta Chain, Quant, S <1 <5 mIU/mL  Basic metabolic panel  Result Value Ref Range   Sodium 136 135 - 145 mmol/L   Potassium 3.4 (L) 3.5 - 5.1 mmol/L   Chloride 103 98 - 111 mmol/L   CO2 21 (L) 22 - 32 mmol/L   Glucose, Bld 161 (H) 70 - 99 mg/dL   BUN 13 6 - 20 mg/dL   Creatinine, Ser 7.820.60 0.44 - 1.00 mg/dL   Calcium 9.0 8.9 - 95.610.3 mg/dL   GFR, Estimated >21>60 >30>60 mL/min   Anion gap 12 5 - 15     EKG None  Radiology No results found.  Procedures Procedures   Medications Ordered in ED Medications  ketorolac (TORADOL) 30 MG/ML injection 30 mg (has no administration in time range)  ondansetron (ZOFRAN-ODT) disintegrating tablet 4 mg (has no administration in time range)  dicyclomine (BENTYL) capsule 20 mg (has no administration in time range)    ED Course  I have reviewed the triage vital signs and the nursing notes.  Pertinent labs & imaging results that were available during my care of the patient were reviewed by me and considered in my medical decision making (see chart for details).    MDM Rules/Calculators/A&P                           Patient to ED with upper abdominal pain, nausea, that feels like she ate something tainted. No fever. Nonbloody emesis. Also had onset right flank pain with long history of kidney stones.   Labs reviewed. No evidence of  urinary infection. There is hematuria and calcium oxalate stones. Cr and BUN normal. Chart reviewed. The patient has had 6 CT scans in the last 2 months alone. Do not want to re-image. Given that is stable clinically, without infection, and has established care with urology, will treat pain and discharge home.   Nausea and upper abdominal pain completely resolved with Zofran and Bentyl.   Database reviewed. Score of 320. Discussed that I would treat her pain in the ED and let her see her urologist for further pain  management. She is agreeable to care plan.   Final Clinical Impression(s) / ED Diagnoses Final diagnoses:  None   Nausea, resolved Right flank pain  Rx / DC Orders ED Discharge Orders     None        Danne Harbor 05/16/21 0147    Palumbo, April, MD 05/16/21 0211

## 2021-05-15 NOTE — ED Triage Notes (Signed)
Pt states that she feels that she has food poisoning since eating her evening meal. Pt states that 30 mins after she ate, she threw up. Pt also complains of right sided flank pain and states that she was dx with a kidney stone in July and believes that it has moved.

## 2021-05-16 LAB — BASIC METABOLIC PANEL
Anion gap: 12 (ref 5–15)
BUN: 13 mg/dL (ref 6–20)
CO2: 21 mmol/L — ABNORMAL LOW (ref 22–32)
Calcium: 9 mg/dL (ref 8.9–10.3)
Chloride: 103 mmol/L (ref 98–111)
Creatinine, Ser: 0.6 mg/dL (ref 0.44–1.00)
GFR, Estimated: >60 mL/min (ref >60)
Glucose, Bld: 161 mg/dL — ABNORMAL HIGH (ref 70–99)
Potassium: 3.4 mmol/L — ABNORMAL LOW (ref 3.5–5.1)
Sodium: 136 mmol/L (ref 135–145)

## 2021-05-16 LAB — CBC WITH DIFFERENTIAL/PLATELET
Abs Immature Granulocytes: 0.15 10*3/uL — ABNORMAL HIGH (ref 0.00–0.07)
Basophils Absolute: 0 10*3/uL (ref 0.0–0.1)
Basophils Relative: 0 %
Eosinophils Absolute: 0.2 10*3/uL (ref 0.0–0.5)
Eosinophils Relative: 2 %
HCT: 33.2 % — ABNORMAL LOW (ref 36.0–46.0)
Hemoglobin: 11.6 g/dL — ABNORMAL LOW (ref 12.0–15.0)
Immature Granulocytes: 2 %
Lymphocytes Relative: 29 %
Lymphs Abs: 3 10*3/uL (ref 0.7–4.0)
MCH: 33 pg (ref 26.0–34.0)
MCHC: 34.9 g/dL (ref 30.0–36.0)
MCV: 94.6 fL (ref 80.0–100.0)
Monocytes Absolute: 0.6 10*3/uL (ref 0.1–1.0)
Monocytes Relative: 6 %
Neutro Abs: 6.4 10*3/uL (ref 1.7–7.7)
Neutrophils Relative %: 61 %
Platelets: 213 10*3/uL (ref 150–400)
RBC: 3.51 MIL/uL — ABNORMAL LOW (ref 3.87–5.11)
RDW: 12.1 % (ref 11.5–15.5)
WBC: 10.3 10*3/uL (ref 4.0–10.5)
nRBC: 0 % (ref 0.0–0.2)

## 2021-05-16 LAB — URINALYSIS, ROUTINE W REFLEX MICROSCOPIC
Bilirubin Urine: NEGATIVE
Glucose, UA: 150 mg/dL — AB
Ketones, ur: 5 mg/dL — AB
Leukocytes,Ua: NEGATIVE
Nitrite: NEGATIVE
Protein, ur: NEGATIVE mg/dL
RBC / HPF: >50 RBC/hpf — ABNORMAL HIGH (ref 0–5)
Specific Gravity, Urine: 1.027 (ref 1.005–1.030)
pH: 6 (ref 5.0–8.0)

## 2021-05-16 LAB — HCG, QUANTITATIVE, PREGNANCY: hCG, Beta Chain, Quant, S: <1 m[IU]/mL (ref <5)

## 2021-05-16 MED ORDER — OXYCODONE-ACETAMINOPHEN 5-325 MG PO TABS
2.0000 | ORAL_TABLET | Freq: Once | ORAL | Status: AC
  Administered 2021-05-16: 2 via ORAL
  Filled 2021-05-16: qty 2

## 2021-05-16 NOTE — Discharge Instructions (Addendum)
See your urologist tomorrow for continuation of care of right flank pain assumed to be recurrent passed kidney stone. No infection is seen. Continue Bentyl and Zofran as prescribed.   Return to the ED with any fever, severe pain, uncontrolled vomiting or new concern.

## 2021-05-27 ENCOUNTER — Other Ambulatory Visit: Payer: Self-pay

## 2021-05-27 ENCOUNTER — Emergency Department (HOSPITAL_BASED_OUTPATIENT_CLINIC_OR_DEPARTMENT_OTHER)
Admission: EM | Admit: 2021-05-27 | Discharge: 2021-05-27 | Disposition: A | Discharge status: Home / Self Care | Payer: Medicaid Other | Attending: Emergency Medicine | Admitting: Emergency Medicine

## 2021-05-27 ENCOUNTER — Encounter (HOSPITAL_BASED_OUTPATIENT_CLINIC_OR_DEPARTMENT_OTHER): Payer: Self-pay | Admitting: *Deleted

## 2021-05-27 ENCOUNTER — Emergency Department (HOSPITAL_BASED_OUTPATIENT_CLINIC_OR_DEPARTMENT_OTHER): Payer: Medicaid Other

## 2021-05-27 DIAGNOSIS — R319 Hematuria, unspecified: Secondary | ICD-10-CM | POA: Diagnosis not present

## 2021-05-27 DIAGNOSIS — F17210 Nicotine dependence, cigarettes, uncomplicated: Secondary | ICD-10-CM | POA: Diagnosis not present

## 2021-05-27 LAB — URINALYSIS, ROUTINE W REFLEX MICROSCOPIC

## 2021-05-27 LAB — BASIC METABOLIC PANEL
Anion gap: 7 (ref 5–15)
BUN: 11 mg/dL (ref 6–20)
CO2: 24 mmol/L (ref 22–32)
Calcium: 9.4 mg/dL (ref 8.9–10.3)
Chloride: 102 mmol/L (ref 98–111)
Creatinine, Ser: 0.75 mg/dL (ref 0.44–1.00)
GFR, Estimated: >60 mL/min (ref >60)
Glucose, Bld: 187 mg/dL — ABNORMAL HIGH (ref 70–99)
Potassium: 3.5 mmol/L (ref 3.5–5.1)
Sodium: 133 mmol/L — ABNORMAL LOW (ref 135–145)

## 2021-05-27 LAB — URINALYSIS, MICROSCOPIC (REFLEX): RBC / HPF: >50 RBC/hpf (ref 0–5)

## 2021-05-27 LAB — CBC
HCT: 32.9 % — ABNORMAL LOW (ref 36.0–46.0)
Hemoglobin: 11.6 g/dL — ABNORMAL LOW (ref 12.0–15.0)
MCH: 33.2 pg (ref 26.0–34.0)
MCHC: 35.3 g/dL (ref 30.0–36.0)
MCV: 94.3 fL (ref 80.0–100.0)
Platelets: 210 10*3/uL (ref 150–400)
RBC: 3.49 MIL/uL — ABNORMAL LOW (ref 3.87–5.11)
RDW: 12.3 % (ref 11.5–15.5)
WBC: 13.2 10*3/uL — ABNORMAL HIGH (ref 4.0–10.5)
nRBC: 0 % (ref 0.0–0.2)

## 2021-05-27 MED ORDER — FENTANYL CITRATE PF 50 MCG/ML IJ SOSY
50.0000 ug | PREFILLED_SYRINGE | Freq: Once | INTRAMUSCULAR | Status: AC
  Administered 2021-05-27: 50 ug via INTRAVENOUS
  Filled 2021-05-27: qty 1

## 2021-05-27 MED ORDER — KETOROLAC TROMETHAMINE 30 MG/ML IJ SOLN
30.0000 mg | Freq: Once | INTRAMUSCULAR | Status: AC
  Administered 2021-05-27: 30 mg via INTRAVENOUS
  Filled 2021-05-27: qty 1

## 2021-05-27 MED ORDER — OXYBUTYNIN CHLORIDE 5 MG PO TABS: 5.0000 mg | ORAL_TABLET | Freq: Three times a day (TID) | ORAL | 0 refills | Status: AC | PRN

## 2021-05-27 NOTE — Discharge Instructions (Addendum)
Take the oxybutynin as needed to help with your spasms. Continue the medications prescribed by urologist. Return to the ER if you start to experience fever, severe pain, uncontrollable vomiting or chest pain.

## 2021-05-27 NOTE — ED Triage Notes (Signed)
Pt is here for blood in urine and severe pain in right flank.  Pt has hx of kidney stone and had a stent placed by [facility] yesterday.  Pt is concerned that something is off with this stent due to the amount of blood in urine and pain. Pt is in obvious pain.

## 2021-05-27 NOTE — ED Notes (Signed)
Called [facility] to consult [facility] urology.

## 2021-05-27 NOTE — ED Notes (Signed)
D/c paperwork reviewed with pt, including prescription. Pt with no questions or concerns at time of d/c. Ambulatory to ED exit.  

## 2021-05-27 NOTE — ED Provider Notes (Signed)
[facility] EMERGENCY DEPARTMENT Provider Note   CSN: [id number] Arrival date & time: 05/27/21  0920     History Chief Complaint  Patient presents with   Hematuria    Katie Spencer is a 40 y.o. female presenting to the ED with a chief complaint of hematuria and flank pain.  Patient had a stent placed for kidney stone yesterday.  She was able to control her pain with the pain medications given.  However when she woke up this morning she noticed significant hematuria and worsening pain in the right flank rating to her right lower quadrant.  Denies any fevers, nausea, vomiting.  This is the first time she has had a stent for her kidney stone.  She called her urology office and they told her to come to the ER for evaluation.  Denies any changes to bowel movements or dysuria.  HPI     Past Medical History:  Diagnosis Date   Abnormal vaginal bleeding    Chronic abdominal pain    Renal disorder     Patient Active Problem List   Diagnosis Date Noted   Acute bilateral low back pain without sciatica 01/10/2021   Lumbar radiculopathy 10/06/2020   Wrist sprain, left, subsequent encounter 09/22/2020    Past Surgical History:  Procedure Laterality Date   ABDOMINAL HYSTERECTOMY     CESAREAN SECTION  2006, 2009, 2013, 2014   CHOLECYSTECTOMY       OB History   No obstetric history on file.     Family History  Adopted: Yes    Social History   Tobacco Use   Smoking status: Every Day    Packs/day: 1.00    Types: Cigarettes   Smokeless tobacco: Never  Vaping Use   Vaping Use: Every day   Substances: Nicotine  Substance Use Topics   Alcohol use: Not Currently   Drug use: Never    Home Medications Prior to Admission medications   Medication Sig Start Date End Date Taking? Authorizing Provider  oxybutynin (DITROPAN) 5 MG tablet Take 1 tablet (5 mg total) by mouth every 8 (eight) hours as needed for up to 7 days for bladder spasms. 05/27/21 06/03/21 Yes Uchenna Rappaport,  Siyon Linck, PA-C  butalbital-acetaminophen-caffeine (FIORICET) 50-325-40 MG tablet Take 1 tablet by mouth 2 (two) times daily as needed for headache.    [provider]  citalopram (CELEXA) 40 MG tablet Take 40 mg by mouth daily. 01/01/20   [provider]  dicyclomine (BENTYL) 20 MG tablet Take 1 tablet (20 mg total) by mouth every 8 (eight) hours as needed for spasms. 12/03/20   Petrucelli, Samantha R, PA-C  HYDROmorphone (DILAUDID) 2 MG tablet Take 1 tablet (2 mg total) by mouth every 4 (four) hours as needed for severe pain. 04/08/21   Molpus, John, MD  hydrOXYzine (ATARAX/VISTARIL) 25 MG tablet Take 25 mg by mouth 3 (three) times daily as needed for anxiety.  04/30/20   [provider]  lidocaine (XYLOCAINE) 2 % solution Use as directed 15 mLs in the mouth or throat every 6 (six) hours as needed for mouth pain. 03/03/21   Pricilla LovelessGoldston, Scott, MD  methocarbamol (ROBAXIN) 500 MG tablet Take 500 mg by mouth daily as needed for muscle spasms. 10/05/20   [provider]  MOBIC 15 MG tablet Take 15 mg by mouth daily as needed for pain. 10/05/20   [provider]  naproxen (NAPROSYN) 500 MG tablet Take 1 tablet (500 mg total) by mouth 2 (two) times daily. 04/27/21  Elpidio Anis, PA-C  ondansetron (ZOFRAN ODT) 4 MG disintegrating tablet Take 1 tablet (4 mg total) by mouth every 8 (eight) hours as needed for nausea or vomiting. 12/03/20   Petrucelli, Samantha R, PA-C  pantoprazole (PROTONIX) 40 MG tablet Take 1 tablet (40 mg total) by mouth 2 (two) times daily. 10/24/20 11/23/20  Dartha Lodge, PA-C  sucralfate (CARAFATE) 1 GM/10ML suspension Take 10 mLs (1 g total) by mouth 4 (four) times daily -  with meals and at bedtime. 03/03/21   Pricilla Loveless, MD  tamsulosin (FLOMAX) 0.4 MG CAPS capsule Take 1 capsule (0.4 mg total) by mouth daily. 10/24/20   Dartha Lodge, PA-C  VRAYLAR 6 MG CAPS Take 6 mg by mouth at bedtime.  01/02/20   [provider]  belladonna-opium (B&O  SUPPRETTES) 16.2-30 MG suppository Place 1 suppository rectally every 8 (eight) hours as needed for pain. Patient not taking: Reported on 05/27/2020 05/20/20 08/18/20  Gilda Crease, MD  lansoprazole (PREVACID) 30 MG capsule Take 1 capsule daily while taking ibuprofen. Patient not taking: Reported on 05/27/2020 04/16/20 08/18/20  Molpus, John, MD    Allergies    Morphine and related and Almond (diagnostic)  Review of Systems   Review of Systems  HENT:  Negative for ear pain, rhinorrhea, sneezing and sore throat.   Respiratory:  Negative for cough, chest tightness and wheezing.    Physical Exam Updated Vital Signs BP 136/84 (BP Location: Right Arm)   Pulse 61   Temp 98.6 F (37 C) (Oral)   Resp 18   Wt 104.3 kg   LMP 04/04/2020 Comment: neg preg test  SpO2 98%   BMI 39.48 kg/m   Physical Exam Vitals and nursing note reviewed.  Constitutional:      General: She is not in acute distress.    Appearance: She is well-developed.  HENT:     Head: Normocephalic and atraumatic.     Nose: Nose normal.  Eyes:     General: No scleral icterus.       Left eye: No discharge.     Conjunctiva/sclera: Conjunctivae normal.  Cardiovascular:     Rate and Rhythm: Regular rhythm.     Heart sounds: Normal heart sounds. No murmur heard.   No friction rub. No gallop.  Pulmonary:     Effort: Pulmonary effort is normal. No respiratory distress.     Breath sounds: Normal breath sounds.  Abdominal:     General: Bowel sounds are normal. There is no distension.     Palpations: Abdomen is soft.     Tenderness: There is abdominal tenderness (Right flank). There is no guarding.  Musculoskeletal:        General: Normal range of motion.     Cervical back: Normal range of motion and neck supple.  Skin:    General: Skin is warm and dry.     Findings: No rash.  Neurological:     Mental Status: She is alert.     Motor: No abnormal muscle tone.     Coordination: Coordination normal.    ED Results  / Procedures / Treatments   Labs (all labs ordered are listed, but only abnormal results are displayed) Labs Reviewed  CBC - Abnormal; Notable for the following components:      Result Value   WBC 13.2 (*)    RBC 3.49 (*)    Hemoglobin 11.6 (*)    HCT 32.9 (*)    All other components within normal limits  BASIC METABOLIC PANEL - Abnormal; Notable for the following components:   Sodium 133 (*)    Glucose, Bld 187 (*)    All other components within normal limits  URINALYSIS, ROUTINE W REFLEX MICROSCOPIC - Abnormal; Notable for the following components:   Color, Urine RED (*)    APPearance TURBID (*)    Glucose, UA   (*)    Value: TEST NOT REPORTED DUE TO COLOR INTERFERENCE OF URINE PIGMENT   Hgb urine dipstick   (*)    Value: TEST NOT REPORTED DUE TO COLOR INTERFERENCE OF URINE PIGMENT   Bilirubin Urine   (*)    Value: TEST NOT REPORTED DUE TO COLOR INTERFERENCE OF URINE PIGMENT   Ketones, ur   (*)    Value: TEST NOT REPORTED DUE TO COLOR INTERFERENCE OF URINE PIGMENT   Protein, ur   (*)    Value: TEST NOT REPORTED DUE TO COLOR INTERFERENCE OF URINE PIGMENT   Nitrite   (*)    Value: TEST NOT REPORTED DUE TO COLOR INTERFERENCE OF URINE PIGMENT   Leukocytes,Ua   (*)    Value: TEST NOT REPORTED DUE TO COLOR INTERFERENCE OF URINE PIGMENT   All other components within normal limits  URINALYSIS, MICROSCOPIC (REFLEX) - Abnormal; Notable for the following components:   Bacteria, UA MANY (*)    All other components within normal limits  URINE CULTURE    EKG None  Radiology CT Renal Stone Study  Result Date: 05/27/2021 CLINICAL DATA:  Flank pain, stent placed yesterday. Persistent pain and hematuria EXAM: CT ABDOMEN AND PELVIS WITHOUT CONTRAST TECHNIQUE: Multidetector CT imaging of the abdomen and pelvis was performed following the standard protocol without IV contrast. COMPARISON:  CT abdomen/pelvis 05/09/2021 FINDINGS: Lower chest: There is minimal dependent subsegmental atelectasis  in the lung bases. The imaged heart is unremarkable. Hepatobiliary: The liver is diffusely hypoattenuating consistent with fatty infiltration. The gallbladder is surgically absent. There is no biliary ductal dilatation. Pancreas: Unremarkable. Spleen: Unremarkable. Adrenals/Urinary Tract: The adrenals are unremarkable. There is a right double-J stent in place with the proximal tip coiled in the collecting system and the distal tip coiled in the bladder. Small locules of air in the right collecting system is likely related to the procedure. There is mild stranding in the fat surrounding the distal ureter. Otherwise, there is no evidence of postprocedural complication. There is no hydronephrosis or hydroureter. The left kidney is normal. No stones are identified. There is no left hydronephrosis or hydroureter. There is a tiny locule of air in the bladder likely related to instrumentation. The bladder is otherwise unremarkable. Stomach/Bowel: The stomach is unremarkable. There is no evidence of bowel obstruction. There is colonic diverticulosis without evidence of acute diverticulitis. Vascular/Lymphatic: The abdominal aorta is nonaneurysmal. There is no abdominal or pelvic lymphadenopathy. Reproductive: The uterus is surgically absent. There is a 3.4 cm simple right ovarian cyst. There is no right adnexal lesion. Other: There is no ascites or free air. Musculoskeletal: There is no acute osseous abnormality or aggressive osseous lesion. IMPRESSION: 1. Status post right double-J stent placement with mild periureteral fat stranding but no evidence of complication. There is no stone, hydronephrosis, or hydroureter. 2. Hepatic steatosis. 3. Diverticulosis without evidence of acute diverticulitis. Electronically Signed   By: Lesia Hausen M.D.   On: 05/27/2021 13:10    Procedures Procedures   Medications Ordered in ED Medications  fentaNYL (SUBLIMAZE) injection 50 mcg (50 mcg Intravenous Given 05/27/21 1523)  ketorolac  (TORADOL) 30 MG/ML  injection 30 mg (30 mg Intravenous Given 05/27/21 1613)    ED Course  I have reviewed the triage vital signs and the nursing notes.  Pertinent labs & imaging results that were available during my care of the patient were reviewed by me and considered in my medical decision making (see chart for details).  Clinical Course as of 05/27/21 1623  Fri May 27, 2021  1459 Bacteria, UA(!): MANY [HK]  1550 Consult with on-call urologist at week.  States that the symptoms are to be expected after her procedure.  Reassuring that the CT does not show any complications.  Recommends treating with NSAIDs, outpatient with oxybutynin as needed and increasing hydration to help flush out the blood.  States that we are able to give her Toradol today. [HK]    Clinical Course User Index [HK] Dietrich Pates, PA-C   MDM Rules/Calculators/A&P                           40 year old female presenting to the ED for right flank pain and hematuria.  Had a stent placed yesterday for a kidney stone.  This morning noticed worsening hematuria and pain that is unable to be controlled with her home pain medication.  Called her urologist and he was told to come to the ER.  On exam there is right flank tenderness.  She denies any vomiting or fever.  She is afebrile here.  Urinalysis unable to be resulted due to color of urine.  There is many bacteria but there are 6-10 squamous cells.  This was sent for culture.  CBC with leukocytosis of 13.2.  BMP is unremarkable.  CT renal stone study shows right double-J stent placement without any complicating features.  No stone, hydronephrosis or hydroureter.  Will attempt to control pain here.  After consulting on-call urologist at [facility] recommends NSAIDs and as needed oxybutynin to help with symptoms.  The pain and hematuria is to be expected after procedure.  Reassuring that the CT does not show any complications.  She remains hemodynamically stable here.  Her  pain was controlled here with Toradol and fentanyl.  She appears comfortable.  We will have her take oxybutynin as needed and increase her hydration and follow-up with her urologist.  Return precautions given.   Patient is hemodynamically stable, in NAD, and able to ambulate in the ED. Evaluation does not show pathology that would require ongoing emergent intervention or inpatient treatment. I explained the diagnosis to the patient. Pain has been managed and has no complaints prior to discharge. Patient is comfortable with above plan and is stable for discharge at this time. All questions were answered prior to disposition. Strict return precautions for returning to the ED were discussed. Encouraged follow up with PCP.   An After Visit Summary was printed and given to the patient.   Portions of this note were generated with Scientist, clinical (histocompatibility and immunogenetics). Dictation errors may occur despite best attempts at proofreading.  Final Clinical Impression(s) / ED Diagnoses Final diagnoses:  Hematuria, unspecified type    Rx / DC Orders ED Discharge Orders          Ordered    oxybutynin (DITROPAN) 5 MG tablet  Every 8 hours PRN        05/27/21 1622             Dietrich Pates, PA-C 05/27/21 1623    Vanetta Mulders, MD 06/01/21 [phone]

## 2021-05-28 LAB — URINE CULTURE: Culture: NO GROWTH

## 2021-06-13 ENCOUNTER — Encounter (HOSPITAL_BASED_OUTPATIENT_CLINIC_OR_DEPARTMENT_OTHER): Payer: Self-pay

## 2021-06-13 ENCOUNTER — Other Ambulatory Visit: Payer: Self-pay

## 2021-06-13 DIAGNOSIS — R3129 Other microscopic hematuria: Secondary | ICD-10-CM | POA: Insufficient documentation

## 2021-06-13 DIAGNOSIS — R1030 Lower abdominal pain, unspecified: Secondary | ICD-10-CM | POA: Diagnosis not present

## 2021-06-13 DIAGNOSIS — D72829 Elevated white blood cell count, unspecified: Secondary | ICD-10-CM | POA: Diagnosis not present

## 2021-06-13 DIAGNOSIS — F17210 Nicotine dependence, cigarettes, uncomplicated: Secondary | ICD-10-CM | POA: Insufficient documentation

## 2021-06-13 LAB — CBC
HCT: 34.6 % — ABNORMAL LOW (ref 36.0–46.0)
Hemoglobin: 12.5 g/dL (ref 12.0–15.0)
MCH: 33.2 pg (ref 26.0–34.0)
MCHC: 36.1 g/dL — ABNORMAL HIGH (ref 30.0–36.0)
MCV: 91.8 fL (ref 80.0–100.0)
Platelets: 268 10*3/uL (ref 150–400)
RBC: 3.77 MIL/uL — ABNORMAL LOW (ref 3.87–5.11)
RDW: 11.9 % (ref 11.5–15.5)
WBC: 11.3 10*3/uL — ABNORMAL HIGH (ref 4.0–10.5)
nRBC: 0 % (ref 0.0–0.2)

## 2021-06-13 LAB — URINALYSIS, MICROSCOPIC (REFLEX): RBC / HPF: >50 RBC/hpf (ref 0–5)

## 2021-06-13 LAB — COMPREHENSIVE METABOLIC PANEL
ALT: 52 U/L — ABNORMAL HIGH (ref 0–44)
AST: 41 U/L (ref 15–41)
Albumin: 4.4 g/dL (ref 3.5–5.0)
Alkaline Phosphatase: 93 U/L (ref 38–126)
Anion gap: 8 (ref 5–15)
BUN: 12 mg/dL (ref 6–20)
CO2: 24 mmol/L (ref 22–32)
Calcium: 9.3 mg/dL (ref 8.9–10.3)
Chloride: 102 mmol/L (ref 98–111)
Creatinine, Ser: 0.77 mg/dL (ref 0.44–1.00)
GFR, Estimated: >60 mL/min (ref >60)
Glucose, Bld: 151 mg/dL — ABNORMAL HIGH (ref 70–99)
Potassium: 3.4 mmol/L — ABNORMAL LOW (ref 3.5–5.1)
Sodium: 134 mmol/L — ABNORMAL LOW (ref 135–145)
Total Bilirubin: 0.7 mg/dL (ref 0.3–1.2)
Total Protein: 7.8 g/dL (ref 6.5–8.1)

## 2021-06-13 LAB — URINALYSIS, ROUTINE W REFLEX MICROSCOPIC
Bilirubin Urine: NEGATIVE
Glucose, UA: NEGATIVE mg/dL
Ketones, ur: NEGATIVE mg/dL
Leukocytes,Ua: NEGATIVE
Nitrite: NEGATIVE
Protein, ur: NEGATIVE mg/dL
Specific Gravity, Urine: 1.03 (ref 1.005–1.030)
pH: 6 (ref 5.0–8.0)

## 2021-06-13 LAB — LIPASE, BLOOD: Lipase: 36 U/L (ref 11–51)

## 2021-06-13 NOTE — ED Triage Notes (Signed)
Pt c/o lower abd cramps x 2 days-states she noticed a hard area to vagina x today-NAD-steady gait

## 2021-06-14 ENCOUNTER — Emergency Department (HOSPITAL_BASED_OUTPATIENT_CLINIC_OR_DEPARTMENT_OTHER)
Admission: EM | Admit: 2021-06-14 | Discharge: 2021-06-14 | Disposition: A | Discharge status: Home / Self Care | Payer: Medicaid Other | Attending: Emergency Medicine | Admitting: Emergency Medicine

## 2021-06-14 ENCOUNTER — Emergency Department (HOSPITAL_BASED_OUTPATIENT_CLINIC_OR_DEPARTMENT_OTHER): Payer: Medicaid Other

## 2021-06-14 DIAGNOSIS — R102 Pelvic and perineal pain: Secondary | ICD-10-CM

## 2021-06-14 DIAGNOSIS — R3129 Other microscopic hematuria: Secondary | ICD-10-CM

## 2021-06-14 LAB — WET PREP, GENITAL
Clue Cells Wet Prep HPF POC: NONE SEEN
Sperm: NONE SEEN
Trich, Wet Prep: NONE SEEN
WBC, Wet Prep HPF POC: NONE SEEN
Yeast Wet Prep HPF POC: NONE SEEN

## 2021-06-14 MED ORDER — KETOROLAC TROMETHAMINE 15 MG/ML IJ SOLN
7.5000 mg | Freq: Once | INTRAMUSCULAR | Status: AC
  Administered 2021-06-14: 7.5 mg via INTRAVENOUS
  Filled 2021-06-14: qty 1

## 2021-06-14 MED ORDER — IOHEXOL 350 MG/ML SOLN
85.0000 mL | Freq: Once | INTRAVENOUS | Status: AC | PRN
  Administered 2021-06-14: 85 mL via INTRAVENOUS

## 2021-06-14 MED ORDER — ONDANSETRON HCL 4 MG/2ML IJ SOLN
4.0000 mg | Freq: Once | INTRAMUSCULAR | Status: AC
  Administered 2021-06-14: 4 mg via INTRAVENOUS
  Filled 2021-06-14: qty 2

## 2021-06-14 MED ORDER — SODIUM CHLORIDE 0.9 % IV BOLUS
1000.0000 mL | Freq: Once | INTRAVENOUS | Status: AC
  Administered 2021-06-14: 1000 mL via INTRAVENOUS

## 2021-06-14 MED ORDER — FENTANYL CITRATE PF 50 MCG/ML IJ SOSY
50.0000 ug | PREFILLED_SYRINGE | Freq: Once | INTRAMUSCULAR | Status: AC
  Administered 2021-06-14: 50 ug via INTRAVENOUS
  Filled 2021-06-14: qty 1

## 2021-06-14 NOTE — ED Notes (Signed)
Patient transported to CT 

## 2021-06-14 NOTE — ED Notes (Signed)
Pt requesting pain medication. Dr. Eudelia Bunch aware.

## 2021-06-14 NOTE — ED Provider Notes (Signed)
[facility] EMERGENCY DEPARTMENT Provider Note  CSN: [id number] Arrival date & time: 06/13/21 2047  Chief Complaint(s) Abdominal Pain  HPI Katie Spencer is a 40 y.o. female    Abdominal Pain Pain location:  Suprapubic Pain quality: cramping   Pain radiates to:  Does not radiate Pain severity:  Moderate Onset quality:  Gradual Duration:  2 days Timing:  Intermittent Relieved by:  Position changes Worsened by:  Movement, palpation and position changes Associated symptoms: hematuria   Associated symptoms: no constipation, no cough, no diarrhea, no dysuria, no fever, no nausea and no vomiting    Past Medical History Past Medical History:  Diagnosis Date   Abnormal vaginal bleeding    Chronic abdominal pain    Renal disorder    Patient Active Problem List   Diagnosis Date Noted   Acute bilateral low back pain without sciatica 01/10/2021   Lumbar radiculopathy 10/06/2020   Wrist sprain, left, subsequent encounter 09/22/2020   Home Medication(s) Prior to Admission medications   Medication Sig Start Date End Date Taking? Authorizing Provider  butalbital-acetaminophen-caffeine (FIORICET) 50-325-40 MG tablet Take 1 tablet by mouth 2 (two) times daily as needed for headache.    [provider]  citalopram (CELEXA) 40 MG tablet Take 40 mg by mouth daily. 01/01/20   [provider]  dicyclomine (BENTYL) 20 MG tablet Take 1 tablet (20 mg total) by mouth every 8 (eight) hours as needed for spasms. 12/03/20   Petrucelli, Samantha R, PA-C  HYDROmorphone (DILAUDID) 2 MG tablet Take 1 tablet (2 mg total) by mouth every 4 (four) hours as needed for severe pain. 04/08/21   Molpus, John, MD  hydrOXYzine (ATARAX/VISTARIL) 25 MG tablet Take 25 mg by mouth 3 (three) times daily as needed for anxiety.  04/30/20   [provider]  lidocaine (XYLOCAINE) 2 % solution Use as directed 15 mLs in the mouth or throat every 6 (six) hours as needed for mouth pain.  03/03/21   Pricilla Loveless, MD  methocarbamol (ROBAXIN) 500 MG tablet Take 500 mg by mouth daily as needed for muscle spasms. 10/05/20   [provider]  MOBIC 15 MG tablet Take 15 mg by mouth daily as needed for pain. 10/05/20   [provider]  naproxen (NAPROSYN) 500 MG tablet Take 1 tablet (500 mg total) by mouth 2 (two) times daily. 04/27/21   Elpidio Anis, PA-C  ondansetron (ZOFRAN ODT) 4 MG disintegrating tablet Take 1 tablet (4 mg total) by mouth every 8 (eight) hours as needed for nausea or vomiting. 12/03/20   Petrucelli, Samantha R, PA-C  pantoprazole (PROTONIX) 40 MG tablet Take 1 tablet (40 mg total) by mouth 2 (two) times daily. 10/24/20 11/23/20  Dartha Lodge, PA-C  sucralfate (CARAFATE) 1 GM/10ML suspension Take 10 mLs (1 g total) by mouth 4 (four) times daily -  with meals and at bedtime. 03/03/21   Pricilla Loveless, MD  tamsulosin (FLOMAX) 0.4 MG CAPS capsule Take 1 capsule (0.4 mg total) by mouth daily. 10/24/20   Dartha Lodge, PA-C  VRAYLAR 6 MG CAPS Take 6 mg by mouth at bedtime.  01/02/20   [provider]  belladonna-opium (B&O SUPPRETTES) 16.2-30 MG suppository Place 1 suppository rectally every 8 (eight) hours as needed for pain. Patient not taking: Reported on 05/27/2020 05/20/20 08/18/20  Gilda Crease, MD  lansoprazole (PREVACID) 30 MG capsule Take 1 capsule daily while taking ibuprofen. Patient not taking: Reported on 05/27/2020 04/16/20 08/18/20  Molpus, Jonny Ruiz, MD  Past Surgical History Past Surgical History:  Procedure Laterality Date   ABDOMINAL HYSTERECTOMY     CESAREAN SECTION  2006, 2009, 2013, 2014   CHOLECYSTECTOMY     Family History Family History  Adopted: Yes    Social History Social History   Tobacco Use   Smoking status: Every Day    Packs/day: 1.00    Types: Cigarettes   Smokeless tobacco: Never   Vaping Use   Vaping Use: Some days   Substances: Nicotine  Substance Use Topics   Alcohol use: Not Currently   Drug use: Never   Allergies Morphine and related and Almond (diagnostic)  Review of Systems Review of Systems  Constitutional:  Negative for fever.  Respiratory:  Negative for cough.   Gastrointestinal:  Positive for abdominal pain. Negative for constipation, diarrhea, nausea and vomiting.  Genitourinary:  Positive for hematuria. Negative for dysuria.  All other systems are reviewed and are negative for acute change except as noted in the HPI  Physical Exam Vital Signs  I have reviewed the triage vital signs BP 105/65   Pulse 70   Temp 98.2 F (36.8 C) (Oral)   Resp 16   Ht 5\' 4"  (1.626 m)   Wt 101.6 kg   LMP 04/04/2020 Comment: neg preg test  SpO2 98%   BMI 38.45 kg/m   Physical Exam Vitals reviewed. Exam conducted with a chaperone present.  Constitutional:      General: She is not in acute distress.    Appearance: She is well-developed. She is not diaphoretic.  HENT:     Head: Normocephalic and atraumatic.     Right Ear: External ear normal.     Left Ear: External ear normal.     Nose: Nose normal.  Eyes:     General: No scleral icterus.    Conjunctiva/sclera: Conjunctivae normal.  Neck:     Trachea: Phonation normal.  Cardiovascular:     Rate and Rhythm: Normal rate and regular rhythm.  Pulmonary:     Effort: Pulmonary effort is normal. No respiratory distress.     Breath sounds: No stridor.  Abdominal:     General: There is no distension.     Tenderness: There is abdominal tenderness in the suprapubic area and left lower quadrant. There is no guarding or rebound.  Genitourinary:    Vagina: Normal. No signs of injury. No vaginal discharge, erythema, tenderness, bleeding or lesions.     Uterus: Absent.        Comments: Surgically removed cervix  Musculoskeletal:        General: Normal range of motion.     Cervical back: Normal range of  motion.  Neurological:     Mental Status: She is alert and oriented to person, place, and time.  Psychiatric:        Behavior: Behavior normal.    ED Results and Treatments Labs (all labs ordered are listed, but only abnormal results are displayed) Labs Reviewed  COMPREHENSIVE METABOLIC PANEL - Abnormal; Notable for the following components:      Result Value   Sodium 134 (*)    Potassium 3.4 (*)    Glucose, Bld 151 (*)    ALT 52 (*)    All other components within normal limits  CBC - Abnormal; Notable for the following components:   WBC 11.3 (*)    RBC 3.77 (*)    HCT 34.6 (*)    MCHC 36.1 (*)    All other components within normal limits  URINALYSIS, ROUTINE W REFLEX MICROSCOPIC - Abnormal; Notable for the following components:   Hgb urine dipstick LARGE (*)    All other components within normal limits  URINALYSIS, MICROSCOPIC (REFLEX) - Abnormal; Notable for the following components:   Bacteria, UA RARE (*)    All other components within normal limits  WET PREP, GENITAL  LIPASE, BLOOD  GC/CHLAMYDIA PROBE AMP ([place]) NOT AT [facility]                                                                                                                         EKG  EKG Interpretation  Date/Time:    Ventricular Rate:    PR Interval:    QRS Duration:   QT Interval:    QTC Calculation:   R Axis:     Text Interpretation:         Radiology CT ABDOMEN PELVIS W CONTRAST  Result Date: 06/14/2021 CLINICAL DATA:  Lower abdominal cramping, diverticulitis EXAM: CT ABDOMEN AND PELVIS WITH CONTRAST TECHNIQUE: Multidetector CT imaging of the abdomen and pelvis was performed using the standard protocol following bolus administration of intravenous contrast. CONTRAST:  60mL OMNIPAQUE IOHEXOL 350 MG/ML SOLN COMPARISON:  05/29/2021 FINDINGS: Lower chest: The visualized lung bases are clear. The visualized heart and pericardium are unremarkable. Hepatobiliary: Moderate hepatic steatosis.  Mild hepatomegaly. No enhancing intrahepatic mass. No intra or extrahepatic biliary ductal dilation. Cholecystectomy has been performed. Pancreas: Unremarkable Spleen: Unremarkable Adrenals/Urinary Tract: The adrenal glands are unremarkable. The kidneys are normal in size and position. Previously noted right ureteral stent has been removed. There has developed mild right hydronephrosis and hydroureter, new since prior examination. Urothelial enhancement and moderate periureteric inflammatory stranding is present on the right which is nonspecific in the setting of recent ureteral stenting, however, superimposed infection could appear similarly. No nephro or urolithiasis. No hydronephrosis on the left. No enhancing intrarenal masses. The bladder is unremarkable. Stomach/Bowel: There is mild-to-moderate scattered diverticulosis throughout the colon. The stomach, small bowel, and large bowel are otherwise unremarkable. No evidence of obstruction or focal inflammation. The appendix is normal. No free intraperitoneal gas or fluid. Vascular/Lymphatic: The abdominal vasculature is unremarkable. No pathologic adenopathy within the abdomen and pelvis. Reproductive: Uterus absent. Residual right ovary is unremarkable. Left ovarian cystic lesion demonstrates interval increase in size, now measuring 5.0 x 5.3 cm, and demonstrates a single thin internal septation. The left ovary demonstrates normal enhancement arguing against ovarian torsion and, as such, this most likely represents an enlarging follicular cyst. Other: No abdominal wall hernia. Scarring within the a extraperitoneal soft tissue anterior to the bladder likely represents postsurgical change related to prior hysterectomy. Rectum unremarkable. Musculoskeletal: No acute bone abnormality. No lytic or blastic bone lesion. IMPRESSION: Interval removal of right ureteral stent with development of recurrent mild right hydronephrosis and hydroureter. Urothelial enhancement  and periureteric inflammatory stranding is nonspecific in the setting of recent stenting, however, superimposed infection could appear similarly and correlation with urinalysis and urine culture may be  helpful. Enlarging left ovarian minimally complex cyst most in keeping with a follicular cyst. This could be better assessed with dedicated transvaginal pelvic sonography, if indicated. Moderate hepatic steatosis and mild hepatomegaly, unchanged. Diverticulosis without superimposed acute inflammatory change. Electronically Signed   By: Helyn Numbers M.D.   On: 06/14/2021 03:00    Pertinent labs & imaging results that were available during my care of the patient were reviewed by me and considered in my medical decision making (see MDM for details).  Medications Ordered in ED Medications  ketorolac (TORADOL) 15 MG/ML injection 7.5 mg (7.5 mg Intravenous Given 06/14/21 0151)  sodium chloride 0.9 % bolus 1,000 mL (1,000 mLs Intravenous New Bag/Given 06/14/21 0150)  fentaNYL (SUBLIMAZE) injection 50 mcg (50 mcg Intravenous Given 06/14/21 0152)  iohexol (OMNIPAQUE) 350 MG/ML injection 85 mL (85 mLs Intravenous Contrast Given 06/14/21 0227)  ondansetron (ZOFRAN) injection 4 mg (4 mg Intravenous Given 06/14/21 0230)  ketorolac (TORADOL) 15 MG/ML injection 7.5 mg (7.5 mg Intravenous Given 06/14/21 0347)                                                                                                                                     Procedures Procedures  (including critical care time)  Medical Decision Making / ED Course I have reviewed the nursing notes for this encounter and the patient's prior records (if available in EHR or on provided paperwork).  Mariam Helbert was evaluated in Emergency Department on 06/14/2021 for the symptoms described in the history of present illness. She was evaluated in the context of the global COVID-19 pandemic, which necessitated consideration that the patient might be at  risk for infection with the SARS-CoV-2 virus that causes COVID-19. Institutional protocols and algorithms that pertain to the evaluation of patients at risk for COVID-19 are in a state of rapid change based on information released by regulatory bodies including the CDC and federal and state organizations. These policies and algorithms were followed during the patient's care in the ED.     Lower abdominal pain. UA not consistent with infection. CBC with mild leukocytosis.  No anemia. No significant electrolyte derangements or renal sufficiency. No evidence of biliary obstruction or pancreatitis. Pelvic exam not concerning for STD.  GC/chlamydia sent.  Wet prep negative for bacterial vaginosis or trichomonas. CT abdomen negative for any acute inflammatory/infectious process or bowel obstruction. Notable left adnexal cyst.  Not concerning for torsion.  Pertinent labs & imaging results that were available during my care of the patient were reviewed by me and considered in my medical decision making:    Final Clinical Impression(s) / ED Diagnoses Final diagnoses:  Suprapubic abdominal pain  Other microscopic hematuria   The patient appears reasonably screened and/or stabilized for discharge and I doubt any other medical condition or other [facility] requiring further screening, evaluation, or treatment in the ED at this time prior to discharge. Safe for discharge with strict return  precautions.  Disposition: Discharge  Condition: Good  I have discussed the results, Dx and Tx plan with the patient/family who expressed understanding and agree(s) with the plan. Discharge instructions discussed at length. The patient/family was given strict return precautions who verbalized understanding of the instructions. No further questions at time of discharge.    ED Discharge Orders     None        Follow Up: Loyal Jacobson, MD [address] [place] [address] [phone]  Call   to schedule an appointment for close follow up    This chart was dictated using voice recognition software.  Despite best efforts to proofread,  errors can occur which can change the documentation meaning.    Nira Conn, MD 06/14/21 [phone]

## 2021-06-15 LAB — GC/CHLAMYDIA PROBE AMP (~~LOC~~) NOT AT ARMC
Chlamydia: NEGATIVE
Comment: NEGATIVE
Comment: NORMAL
Neisseria Gonorrhea: NEGATIVE

## 2021-06-24 ENCOUNTER — Emergency Department (HOSPITAL_BASED_OUTPATIENT_CLINIC_OR_DEPARTMENT_OTHER)
Admission: EM | Admit: 2021-06-24 | Discharge: 2021-06-24 | Disposition: A | Discharge status: Home / Self Care | Payer: Medicaid Other | Attending: Emergency Medicine | Admitting: Emergency Medicine

## 2021-06-24 ENCOUNTER — Other Ambulatory Visit: Payer: Self-pay

## 2021-06-24 ENCOUNTER — Encounter (HOSPITAL_BASED_OUTPATIENT_CLINIC_OR_DEPARTMENT_OTHER): Payer: Self-pay

## 2021-06-24 DIAGNOSIS — G43109 Migraine with aura, not intractable, without status migrainosus: Secondary | ICD-10-CM | POA: Insufficient documentation

## 2021-06-24 DIAGNOSIS — R102 Pelvic and perineal pain: Secondary | ICD-10-CM | POA: Diagnosis not present

## 2021-06-24 DIAGNOSIS — F17210 Nicotine dependence, cigarettes, uncomplicated: Secondary | ICD-10-CM | POA: Diagnosis not present

## 2021-06-24 DIAGNOSIS — R519 Headache, unspecified: Secondary | ICD-10-CM | POA: Diagnosis present

## 2021-06-24 MED ORDER — KETOROLAC TROMETHAMINE 15 MG/ML IJ SOLN
15.0000 mg | Freq: Once | INTRAMUSCULAR | Status: AC
  Administered 2021-06-24: 15 mg via INTRAVENOUS
  Filled 2021-06-24: qty 1

## 2021-06-24 MED ORDER — SODIUM CHLORIDE 0.9 % IV BOLUS
1000.0000 mL | Freq: Once | INTRAVENOUS | Status: AC
  Administered 2021-06-24: 1000 mL via INTRAVENOUS

## 2021-06-24 MED ORDER — DEXAMETHASONE SODIUM PHOSPHATE 10 MG/ML IJ SOLN
10.0000 mg | Freq: Once | INTRAMUSCULAR | Status: AC
  Administered 2021-06-24: 10 mg via INTRAVENOUS
  Filled 2021-06-24: qty 1

## 2021-06-24 MED ORDER — PROCHLORPERAZINE EDISYLATE 10 MG/2ML IJ SOLN
10.0000 mg | Freq: Once | INTRAMUSCULAR | Status: AC
  Administered 2021-06-24: 10 mg via INTRAVENOUS
  Filled 2021-06-24: qty 2

## 2021-06-24 MED ORDER — DIPHENHYDRAMINE HCL 50 MG/ML IJ SOLN
25.0000 mg | Freq: Once | INTRAMUSCULAR | Status: AC
  Administered 2021-06-24: 25 mg via INTRAVENOUS
  Filled 2021-06-24: qty 1

## 2021-06-24 NOTE — ED Triage Notes (Signed)
Pt c/o migraine x 2 hours-NAD-steady gait

## 2021-06-24 NOTE — ED Provider Notes (Signed)
[facility] EMERGENCY DEPARTMENT Provider Note   CSN: [id number] Arrival date & time: 06/24/21  2215     History Chief Complaint  Patient presents with   Migraine    Katie Spencer is a 40 y.o. female past medical history of recurrent migraines who presents with a worsening migraine with aura, photophobia, phonophobia for the last 2 hours.  Patient reports that she had chronic migraines when she was younger, however had many years without evidence until around 6 months ago when she began having some sporadically again.  Patient reports that this headache began somewhat suddenly, with normal migraine symptoms including some tunnel vision, spots in eyes, nausea with vomiting, sensitivity to light, sensitivity to sound.  Patient also complains of some ongoing cramping being pelvic pain right greater than left secondary to known enlarging ovarian cyst.  Patient denies any new dysuria, vaginal discharge, reports the pain is manageable.  We will focus on her headache symptoms today.  Patient is already attempted ibuprofen, Tylenol, caffeine with no relief.   Migraine Associated symptoms include headaches.      Past Medical History:  Diagnosis Date   Abnormal vaginal bleeding    Chronic abdominal pain    Renal disorder     Patient Active Problem List   Diagnosis Date Noted   Acute bilateral low back pain without sciatica 01/10/2021   Lumbar radiculopathy 10/06/2020   Wrist sprain, left, subsequent encounter 09/22/2020    Past Surgical History:  Procedure Laterality Date   ABDOMINAL HYSTERECTOMY     CESAREAN SECTION  2006, 2009, 2013, 2014   CHOLECYSTECTOMY       OB History   No obstetric history on file.     Family History  Adopted: Yes    Social History   Tobacco Use   Smoking status: Every Day    Packs/day: 1.00    Types: Cigarettes   Smokeless tobacco: Never  Vaping Use   Vaping Use: Former   Substances: Nicotine  Substance Use Topics   Alcohol  use: Not Currently   Drug use: Never    Home Medications Prior to Admission medications   Medication Sig Start Date End Date Taking? Authorizing Provider  butalbital-acetaminophen-caffeine (FIORICET) 50-325-40 MG tablet Take 1 tablet by mouth 2 (two) times daily as needed for headache.    [provider]  citalopram (CELEXA) 40 MG tablet Take 40 mg by mouth daily. 01/01/20   [provider]  dicyclomine (BENTYL) 20 MG tablet Take 1 tablet (20 mg total) by mouth every 8 (eight) hours as needed for spasms. 12/03/20   Petrucelli, Samantha R, PA-C  HYDROmorphone (DILAUDID) 2 MG tablet Take 1 tablet (2 mg total) by mouth every 4 (four) hours as needed for severe pain. 04/08/21   Molpus, John, MD  hydrOXYzine (ATARAX/VISTARIL) 25 MG tablet Take 25 mg by mouth 3 (three) times daily as needed for anxiety.  04/30/20   [provider]  lidocaine (XYLOCAINE) 2 % solution Use as directed 15 mLs in the mouth or throat every 6 (six) hours as needed for mouth pain. 03/03/21   Pricilla Loveless, MD  methocarbamol (ROBAXIN) 500 MG tablet Take 500 mg by mouth daily as needed for muscle spasms. 10/05/20   [provider]  MOBIC 15 MG tablet Take 15 mg by mouth daily as needed for pain. 10/05/20   [provider]  naproxen (NAPROSYN) 500 MG tablet Take 1 tablet (500 mg total) by mouth 2 (two) times daily. 04/27/21   Upstill,  Melvenia Beam, PA-C  ondansetron (ZOFRAN ODT) 4 MG disintegrating tablet Take 1 tablet (4 mg total) by mouth every 8 (eight) hours as needed for nausea or vomiting. 12/03/20   Petrucelli, Samantha R, PA-C  pantoprazole (PROTONIX) 40 MG tablet Take 1 tablet (40 mg total) by mouth 2 (two) times daily. 10/24/20 11/23/20  Dartha Lodge, PA-C  sucralfate (CARAFATE) 1 GM/10ML suspension Take 10 mLs (1 g total) by mouth 4 (four) times daily -  with meals and at bedtime. 03/03/21   Pricilla Loveless, MD  tamsulosin (FLOMAX) 0.4 MG CAPS capsule Take 1 capsule (0.4 mg total) by mouth  daily. 10/24/20   Dartha Lodge, PA-C  VRAYLAR 6 MG CAPS Take 6 mg by mouth at bedtime.  01/02/20   [provider]  belladonna-opium (B&O SUPPRETTES) 16.2-30 MG suppository Place 1 suppository rectally every 8 (eight) hours as needed for pain. Patient not taking: Reported on 05/27/2020 05/20/20 08/18/20  Gilda Crease, MD  lansoprazole (PREVACID) 30 MG capsule Take 1 capsule daily while taking ibuprofen. Patient not taking: Reported on 05/27/2020 04/16/20 08/18/20  Molpus, Jonny Ruiz, MD    Allergies    Morphine and related and Almond (diagnostic)  Review of Systems   Review of Systems  Eyes:  Positive for photophobia.  Gastrointestinal:  Positive for nausea and vomiting.  Neurological:  Positive for headaches. Negative for seizures, weakness and numbness.  All other systems reviewed and are negative.  Physical Exam Updated Vital Signs BP 134/81 (BP Location: Left Arm)   Pulse 91   Temp 98.9 F (37.2 C) (Oral)   Resp 20   Ht 5\' 3"  (1.6 m)   Wt 102.1 kg   LMP 04/04/2020 Comment: neg preg test  SpO2 100%   BMI 39.86 kg/m   Physical Exam Vitals and nursing note reviewed.  Constitutional:      General: She is not in acute distress.    Appearance: Normal appearance.  HENT:     Head: Normocephalic and atraumatic.  Eyes:     General:        Right eye: No discharge.        Left eye: No discharge.  Cardiovascular:     Rate and Rhythm: Normal rate and regular rhythm.     Heart sounds: No murmur heard.   No friction rub. No gallop.  Pulmonary:     Effort: Pulmonary effort is normal.     Breath sounds: Normal breath sounds.  Abdominal:     Comments: Some minimal suprapubic tenderness to palpation on the right.  Musculoskeletal:     Cervical back: Normal range of motion. No tenderness.  Skin:    General: Skin is warm and dry.     Capillary Refill: Capillary refill takes less than 2 seconds.  Neurological:     Mental Status: She is alert and oriented to person, place,  and time.     Comments: Cranial nerves III through XII grossly intact.  Intact strength 5 out of 5 bilateral upper and lower extremities.  Romberg negative, gait normal.  Intact finger-to-nose.  Psychiatric:        Mood and Affect: Mood normal.        Behavior: Behavior normal.    ED Results / Procedures / Treatments   Labs (all labs ordered are listed, but only abnormal results are displayed) Labs Reviewed - No data to display  EKG None  Radiology No results found.  Procedures Procedures   Medications Ordered in ED Medications  sodium  chloride 0.9 % bolus 1,000 mL (1,000 mLs Intravenous New Bag/Given 06/24/21 2310)  ketorolac (TORADOL) 15 MG/ML injection 15 mg (15 mg Intravenous Given 06/24/21 2315)  prochlorperazine (COMPAZINE) injection 10 mg (10 mg Intravenous Given 06/24/21 2319)  dexamethasone (DECADRON) injection 10 mg (10 mg Intravenous Given 06/24/21 2318)  diphenhydrAMINE (BENADRYL) injection 25 mg (25 mg Intravenous Given 06/24/21 2320)    ED Course  I have reviewed the triage vital signs and the nursing notes.  Pertinent labs & imaging results that were available during my care of the patient were reviewed by me and considered in my medical decision making (see chart for details).    MDM Rules/Calculators/A&P                         Patient with no red flag symptoms for headache including sudden, thunderclap onset, immunosuppression, neck tenderness or stiffness, fever, neurologic deficit.  Patient reports this feels like a normal migraine, has not responded to her normal regimen including ibuprofen, Tylenol, caffeine.  Patient with minimal clinical concern for intracranial abnormality or neurologic deficit at this time.  Will administer migraine cocktail.  Patient reevaluated and does report that her headache symptoms have improved markedly after migraine cocktail.  Patient does report some ongoing pelvic cramping.  We discussed that this is consistent with her known  presentation of an enlarging right ovarian cyst.  Patient does not have severe pain to abdominal palpation, and reports that the pain is intermittent and remitting, minimal clinical sign for ovarian torsion at this time.  Patient discharged in stable condition with return precautions. Final Clinical Impression(s) / ED Diagnoses Final diagnoses:  Migraine with aura and without status migrainosus, not intractable  Adnexal pain    Rx / DC Orders ED Discharge Orders     None        [place] 06/24/21 2352    Gwyneth Sprout, MD 06/27/21 [phone]

## 2021-06-24 NOTE — Discharge Instructions (Addendum)
Please return if your symptoms worsen or fail to improve.  Please follow-up with your primary care as needed if you continue to have recurrent migraines to discuss prophylactic medication.  Please follow-up with your OB/GYN to discuss your pelvic pain. Please use Tylenol or ibuprofen for pain.  You may use 600 mg ibuprofen every 6 hours or 1000 mg of Tylenol every 6 hours.  You may choose to alternate between the 2.  This would be most effective.  Not to exceed 4 g of Tylenol within 24 hours.  Not to exceed 3200 mg ibuprofen 24 hours.

## 2021-07-27 ENCOUNTER — Emergency Department (HOSPITAL_BASED_OUTPATIENT_CLINIC_OR_DEPARTMENT_OTHER)
Admission: EM | Admit: 2021-07-27 | Discharge: 2021-07-27 | Disposition: A | Discharge status: Home / Self Care | Payer: Medicaid Other | Attending: Emergency Medicine | Admitting: Emergency Medicine

## 2021-07-27 ENCOUNTER — Encounter (HOSPITAL_BASED_OUTPATIENT_CLINIC_OR_DEPARTMENT_OTHER): Payer: Self-pay

## 2021-07-27 ENCOUNTER — Other Ambulatory Visit: Payer: Self-pay

## 2021-07-27 ENCOUNTER — Emergency Department (HOSPITAL_BASED_OUTPATIENT_CLINIC_OR_DEPARTMENT_OTHER): Payer: Medicaid Other

## 2021-07-27 DIAGNOSIS — M25552 Pain in left hip: Secondary | ICD-10-CM | POA: Diagnosis not present

## 2021-07-27 DIAGNOSIS — F17210 Nicotine dependence, cigarettes, uncomplicated: Secondary | ICD-10-CM | POA: Diagnosis not present

## 2021-07-27 DIAGNOSIS — M545 Low back pain, unspecified: Secondary | ICD-10-CM | POA: Insufficient documentation

## 2021-07-27 DIAGNOSIS — M25559 Pain in unspecified hip: Secondary | ICD-10-CM

## 2021-07-27 DIAGNOSIS — M25551 Pain in right hip: Secondary | ICD-10-CM | POA: Diagnosis not present

## 2021-07-27 LAB — URINALYSIS, ROUTINE W REFLEX MICROSCOPIC
Bilirubin Urine: NEGATIVE
Glucose, UA: NEGATIVE mg/dL
Hgb urine dipstick: NEGATIVE
Ketones, ur: NEGATIVE mg/dL
Leukocytes,Ua: NEGATIVE
Nitrite: NEGATIVE
Protein, ur: NEGATIVE mg/dL
Specific Gravity, Urine: 1.02 (ref 1.005–1.030)
pH: 7 (ref 5.0–8.0)

## 2021-07-27 LAB — PREGNANCY, URINE: Preg Test, Ur: NEGATIVE

## 2021-07-27 MED ORDER — PREDNISONE 10 MG PO TABS: 20.0000 mg | ORAL_TABLET | Freq: Every day | ORAL | 0 refills | Status: DC

## 2021-07-27 MED ORDER — METHYLPREDNISOLONE SODIUM SUCC 125 MG IJ SOLR
125.0000 mg | Freq: Once | INTRAMUSCULAR | Status: AC
  Administered 2021-07-27: 125 mg via INTRAMUSCULAR
  Filled 2021-07-27: qty 2

## 2021-07-27 NOTE — Discharge Instructions (Signed)
Starting in 3 days if you are still having pain take prednisone 40 mg for 5 days. Follow-up with your primary care doctor as scheduled on the 18th. Return to the ED if things change or worsen.

## 2021-07-27 NOTE — ED Notes (Signed)
Pt in radiology 

## 2021-07-27 NOTE — ED Provider Notes (Signed)
[facility] EMERGENCY DEPARTMENT Provider Note   CSN: [id number] Arrival date & time: 07/27/21  1551     History Chief Complaint  Patient presents with   Hip Pain    Katie Spencer is a 40 y.o. female.  HPI  Patient presents with bilateral hip pain.  Started 2 weeks ago, the pain is been constant.  The pain is worsened by movement.  Today there started to be associated pins-and-needles/numbness tingling to the left lower extremity on the lateral side.  Started feeling the same on the right prompting her to come to the emergency department.  Denies any trauma to the hip or spine.  No urinary retention or saddle anesthesia.  She has tried Tylenol, minimal relief.  No personal history of malignancy, no IV drug use, no prior surgeries on her back.  Past Medical History:  Diagnosis Date   Abnormal vaginal bleeding    Chronic abdominal pain    Renal disorder     Patient Active Problem List   Diagnosis Date Noted   Acute bilateral low back pain without sciatica 01/10/2021   Lumbar radiculopathy 10/06/2020   Wrist sprain, left, subsequent encounter 09/22/2020    Past Surgical History:  Procedure Laterality Date   ABDOMINAL HYSTERECTOMY     CESAREAN SECTION  2006, 2009, 2013, 2014   CHOLECYSTECTOMY       OB History   No obstetric history on file.     Family History  Adopted: Yes    Social History   Tobacco Use   Smoking status: Every Day    Packs/day: 1.00    Types: Cigarettes   Smokeless tobacco: Never  Vaping Use   Vaping Use: Former   Substances: Nicotine  Substance Use Topics   Alcohol use: Not Currently   Drug use: Never    Home Medications Prior to Admission medications   Medication Sig Start Date End Date Taking? Authorizing Provider  butalbital-acetaminophen-caffeine (FIORICET) 50-325-40 MG tablet Take 1 tablet by mouth 2 (two) times daily as needed for headache.    [provider]  citalopram (CELEXA) 40 MG tablet Take 40 mg  by mouth daily. 01/01/20   [provider]  dicyclomine (BENTYL) 20 MG tablet Take 1 tablet (20 mg total) by mouth every 8 (eight) hours as needed for spasms. 12/03/20   Petrucelli, Samantha R, PA-C  HYDROmorphone (DILAUDID) 2 MG tablet Take 1 tablet (2 mg total) by mouth every 4 (four) hours as needed for severe pain. 04/08/21   Molpus, John, MD  hydrOXYzine (ATARAX/VISTARIL) 25 MG tablet Take 25 mg by mouth 3 (three) times daily as needed for anxiety.  04/30/20   [provider]  lidocaine (XYLOCAINE) 2 % solution Use as directed 15 mLs in the mouth or throat every 6 (six) hours as needed for mouth pain. 03/03/21   Pricilla Loveless, MD  methocarbamol (ROBAXIN) 500 MG tablet Take 500 mg by mouth daily as needed for muscle spasms. 10/05/20   [provider]  MOBIC 15 MG tablet Take 15 mg by mouth daily as needed for pain. 10/05/20   [provider]  naproxen (NAPROSYN) 500 MG tablet Take 1 tablet (500 mg total) by mouth 2 (two) times daily. 04/27/21   Elpidio Anis, PA-C  ondansetron (ZOFRAN ODT) 4 MG disintegrating tablet Take 1 tablet (4 mg total) by mouth every 8 (eight) hours as needed for nausea or vomiting. 12/03/20   Petrucelli, Samantha R, PA-C  pantoprazole (PROTONIX) 40 MG tablet Take 1 tablet (40  mg total) by mouth 2 (two) times daily. 10/24/20 11/23/20  Dartha Lodge, PA-C  sucralfate (CARAFATE) 1 GM/10ML suspension Take 10 mLs (1 g total) by mouth 4 (four) times daily -  with meals and at bedtime. 03/03/21   Pricilla Loveless, MD  tamsulosin (FLOMAX) 0.4 MG CAPS capsule Take 1 capsule (0.4 mg total) by mouth daily. 10/24/20   Dartha Lodge, PA-C  VRAYLAR 6 MG CAPS Take 6 mg by mouth at bedtime.  01/02/20   [provider]  belladonna-opium (B&O SUPPRETTES) 16.2-30 MG suppository Place 1 suppository rectally every 8 (eight) hours as needed for pain. Patient not taking: Reported on 05/27/2020 05/20/20 08/18/20  Gilda Crease, MD  lansoprazole (PREVACID) 30 MG  capsule Take 1 capsule daily while taking ibuprofen. Patient not taking: Reported on 05/27/2020 04/16/20 08/18/20  Molpus, Jonny Ruiz, MD    Allergies    Morphine and related, Almond (diagnostic), and Nsaids  Review of Systems   Review of Systems  Constitutional:  Positive for fever.  Cardiovascular:  Negative for leg swelling.  Musculoskeletal:  Positive for gait problem and myalgias. Negative for joint swelling.  Neurological:  Positive for numbness. Negative for tremors, syncope and weakness.   Physical Exam Updated Vital Signs BP 128/75 (BP Location: Left Arm)   Pulse 94   Temp 99.1 F (37.3 C) (Oral)   Resp 16   Ht 5\' 4"  (1.626 m)   Wt 98.4 kg   LMP 04/04/2020 Comment: neg preg test  SpO2 95%   BMI 37.25 kg/m   Physical Exam Vitals and nursing note reviewed. Exam conducted with a chaperone present.  Constitutional:      Appearance: Normal appearance.  HENT:     Head: Normocephalic and atraumatic.  Eyes:     General: No scleral icterus.       Right eye: No discharge.        Left eye: No discharge.     Extraocular Movements: Extraocular movements intact.     Pupils: Pupils are equal, round, and reactive to light.  Cardiovascular:     Rate and Rhythm: Normal rate and regular rhythm.     Pulses: Normal pulses.     Heart sounds: Normal heart sounds. No murmur heard.   No friction rub. No gallop.     Comments: DP and PT are 2+ bilaterally Pulmonary:     Effort: Pulmonary effort is normal. No respiratory distress.     Breath sounds: Normal breath sounds.  Abdominal:     General: Abdomen is flat. Bowel sounds are normal. There is no distension.     Palpations: Abdomen is soft.     Tenderness: There is no abdominal tenderness.  Musculoskeletal:     Comments: Tenderness to palpation of L3-L4.  Patient also has tenderness of palpation of the hips bilaterally.  Able to tolerate passive range of motion.  Skin:    General: Skin is warm and dry.     Coloration: Skin is not  jaundiced.  Neurological:     Mental Status: She is alert. Mental status is at baseline.     Coordination: Coordination normal.     Comments: Cranial nerves III through XII are grossly intact.  Grip strength equal bilaterally, able to raise both lower extremities.  Patient is ambulatory.   ED Results / Procedures / Treatments   Labs (all labs ordered are listed, but only abnormal results are displayed) Labs Reviewed - No data to display  EKG None  Radiology No results  found.  Procedures Procedures   Medications Ordered in ED Medications - No data to display  ED Course  I have reviewed the triage vital signs and the nursing notes.  Pertinent labs & imaging results that were available during my care of the patient were reviewed by me and considered in my medical decision making (see chart for details).    MDM Rules/Calculators/A&P                           Vitals are stable, patient is nontoxic-appearing.  She is able to tolerate passive range of motion, low suspicion for septic joint.  It is bilateral, she does have some mild lumbar tenderness but does not have any symptoms concerning for cauda equina.  She also has no focal deficits.  Will get imaging and reassess.  Imaging negative for fracture or dislocation.  Pain could be consistent with bilateral sciatica, will do a short prednisone taper.  Doubt any emergent pathology given the fact she is able to ambulate, she is stable vitals, no fever or history concerning for infectious process.  Patient discharged in stable position.  Final Clinical Impression(s) / ED Diagnoses Final diagnoses:  None    Rx / DC Orders ED Discharge Orders     None        Theron Arista, PA-C 07/27/21 1716    Charlynne Pander, MD 07/31/21 1451

## 2021-07-27 NOTE — ED Triage Notes (Signed)
Pt c/o bilat hip pain x 2 weeks-numbness to bilat LE x today-denies injury-NAD-steady gait

## 2021-07-28 ENCOUNTER — Other Ambulatory Visit: Payer: Self-pay

## 2021-07-28 ENCOUNTER — Encounter (HOSPITAL_BASED_OUTPATIENT_CLINIC_OR_DEPARTMENT_OTHER): Payer: Self-pay

## 2021-07-28 ENCOUNTER — Emergency Department (HOSPITAL_BASED_OUTPATIENT_CLINIC_OR_DEPARTMENT_OTHER)
Admission: EM | Admit: 2021-07-28 | Discharge: 2021-07-28 | Disposition: A | Discharge status: Home / Self Care | Payer: Medicaid Other | Attending: Emergency Medicine | Admitting: Emergency Medicine

## 2021-07-28 DIAGNOSIS — R202 Paresthesia of skin: Secondary | ICD-10-CM | POA: Diagnosis not present

## 2021-07-28 DIAGNOSIS — F17210 Nicotine dependence, cigarettes, uncomplicated: Secondary | ICD-10-CM | POA: Insufficient documentation

## 2021-07-28 DIAGNOSIS — M25552 Pain in left hip: Secondary | ICD-10-CM | POA: Diagnosis not present

## 2021-07-28 DIAGNOSIS — M25551 Pain in right hip: Secondary | ICD-10-CM | POA: Diagnosis present

## 2021-07-28 MED ORDER — LIDOCAINE 5 % EX PTCH
1.0000 | MEDICATED_PATCH | CUTANEOUS | 0 refills | Status: DC
Start: 2021-07-28 — End: 2021-07-28

## 2021-07-28 MED ORDER — METHOCARBAMOL 500 MG PO TABS: 500.0000 mg | ORAL_TABLET | Freq: Every day | ORAL | 0 refills | Status: DC | PRN

## 2021-07-28 MED ORDER — METHOCARBAMOL 500 MG PO TABS: 500.0000 mg | ORAL_TABLET | Freq: Every day | ORAL | 0 refills | Status: AC | PRN

## 2021-07-28 MED ORDER — LIDOCAINE 5 % EX PTCH: 1.0000 | MEDICATED_PATCH | CUTANEOUS | 0 refills | Status: DC

## 2021-07-28 NOTE — ED Triage Notes (Signed)
Was seen here yesterday for bilateral hip pain. Came back to get prescription pain medicine besides steroid.

## 2021-07-28 NOTE — ED Notes (Signed)
Pt. Reports she has had hip pain bilat. For several weeks.  Pt. Reports she cannot take motrin or other related.  Pt. Said she was here yesterday had steroid shot that helped her to walk but hips still hurting with lifting her legs.  Pt. Reports this started the beginning of November.    Pt. Said she was told not take tramado as well due to it was an NSAID medication.

## 2021-07-28 NOTE — ED Provider Notes (Signed)
[facility] EMERGENCY DEPARTMENT Provider Note   CSN: [id number] Arrival date & time: 07/28/21  1636     History Chief Complaint  Patient presents with   Hip Pain    Katie Spencer is a 40 y.o. female presenting today with a complaint of bilateral hip pain.  She was seen yesterday and fully evaluated.  Given a Decadron shot prior to departure.  She reported that this helped the numbness and tingling going down her legs however she still has pain on both sides.  Tylenol is not helping.  Heat pad helps her however it is timed and turns off in the middle of the night.  Was given a referral to orthopedist in [place].  Requesting a referral to Dr. Thamas Jaegers with ortho in [place].   Hip Pain      Past Medical History:  Diagnosis Date   Abnormal vaginal bleeding    Chronic abdominal pain    Renal disorder     Patient Active Problem List   Diagnosis Date Noted   Acute bilateral low back pain without sciatica 01/10/2021   Lumbar radiculopathy 10/06/2020   Wrist sprain, left, subsequent encounter 09/22/2020    Past Surgical History:  Procedure Laterality Date   ABDOMINAL HYSTERECTOMY     CESAREAN SECTION  2006, 2009, 2013, 2014   CHOLECYSTECTOMY       OB History   No obstetric history on file.     Family History  Adopted: Yes    Social History   Tobacco Use   Smoking status: Every Day    Packs/day: 1.00    Types: Cigarettes   Smokeless tobacco: Never  Vaping Use   Vaping Use: Former   Substances: Nicotine  Substance Use Topics   Alcohol use: Not Currently   Drug use: Never    Home Medications Prior to Admission medications   Medication Sig Start Date End Date Taking? Authorizing Provider  butalbital-acetaminophen-caffeine (FIORICET) 50-325-40 MG tablet Take 1 tablet by mouth 2 (two) times daily as needed for headache.    [provider]  citalopram (CELEXA) 40 MG tablet Take 40 mg by mouth daily. 01/01/20   [provider]   dicyclomine (BENTYL) 20 MG tablet Take 1 tablet (20 mg total) by mouth every 8 (eight) hours as needed for spasms. 12/03/20   Petrucelli, Samantha R, PA-C  HYDROmorphone (DILAUDID) 2 MG tablet Take 1 tablet (2 mg total) by mouth every 4 (four) hours as needed for severe pain. 04/08/21   Molpus, John, MD  hydrOXYzine (ATARAX/VISTARIL) 25 MG tablet Take 25 mg by mouth 3 (three) times daily as needed for anxiety.  04/30/20   [provider]  lidocaine (LIDODERM) 5 % Place 1 patch onto the skin daily. Remove & Discard patch within 12 hours or as directed by MD 07/28/21   Nikodem Leadbetter A, PA-C  lidocaine (XYLOCAINE) 2 % solution Use as directed 15 mLs in the mouth or throat every 6 (six) hours as needed for mouth pain. 03/03/21   Pricilla Loveless, MD  methocarbamol (ROBAXIN) 500 MG tablet Take 1 tablet (500 mg total) by mouth daily as needed for up to 14 days. 07/28/21 08/11/21  Maegen Wigle A, PA-C  MOBIC 15 MG tablet Take 15 mg by mouth daily as needed for pain. 10/05/20   [provider]  naproxen (NAPROSYN) 500 MG tablet Take 1 tablet (500 mg total) by mouth 2 (two) times daily. 04/27/21   Elpidio Anis, PA-C  ondansetron (ZOFRAN ODT) 4 MG disintegrating  tablet Take 1 tablet (4 mg total) by mouth every 8 (eight) hours as needed for nausea or vomiting. 12/03/20   Petrucelli, Samantha R, PA-C  pantoprazole (PROTONIX) 40 MG tablet Take 1 tablet (40 mg total) by mouth 2 (two) times daily. 10/24/20 11/23/20  Dartha Lodge, PA-C  predniSONE (DELTASONE) 10 MG tablet Take 2 tablets (20 mg total) by mouth daily. 07/27/21   Theron Arista, PA-C  sucralfate (CARAFATE) 1 GM/10ML suspension Take 10 mLs (1 g total) by mouth 4 (four) times daily -  with meals and at bedtime. 03/03/21   Pricilla Loveless, MD  tamsulosin (FLOMAX) 0.4 MG CAPS capsule Take 1 capsule (0.4 mg total) by mouth daily. 10/24/20   Dartha Lodge, PA-C  VRAYLAR 6 MG CAPS Take 6 mg by mouth at bedtime.  01/02/20   [provider]   belladonna-opium (B&O SUPPRETTES) 16.2-30 MG suppository Place 1 suppository rectally every 8 (eight) hours as needed for pain. Patient not taking: Reported on 05/27/2020 05/20/20 08/18/20  Gilda Crease, MD  lansoprazole (PREVACID) 30 MG capsule Take 1 capsule daily while taking ibuprofen. Patient not taking: Reported on 05/27/2020 04/16/20 08/18/20  Molpus, Jonny Ruiz, MD    Allergies    Morphine and related, Almond (diagnostic), and Nsaids  Review of Systems   Review of Systems  Musculoskeletal:  Positive for arthralgias and myalgias. Negative for gait problem and joint swelling.  Skin:  Negative for wound.  Neurological:  Negative for weakness and numbness.   Physical Exam Updated Vital Signs BP 118/67 (BP Location: Left Arm)   Pulse 72   Temp 98.3 F (36.8 C) (Oral)   Resp 18   Ht 5\' 4"  (1.626 m)   Wt 98.4 kg   LMP 04/04/2020 Comment: neg preg test  SpO2 100%   BMI 37.25 kg/m   Physical Exam Vitals and nursing note reviewed.  Constitutional:      Appearance: Normal appearance.  HENT:     Head: Normocephalic and atraumatic.  Eyes:     General: No scleral icterus.    Conjunctiva/sclera: Conjunctivae normal.  Pulmonary:     Effort: Pulmonary effort is normal. No respiratory distress.  Musculoskeletal:        General: Tenderness present. No swelling or deformity. Normal range of motion.  Skin:    General: Skin is warm and dry.     Findings: No rash.  Neurological:     Mental Status: She is alert.     Motor: No weakness.     Gait: Gait normal.  Psychiatric:        Mood and Affect: Mood normal.        Behavior: Behavior normal.    ED Results / Procedures / Treatments   Labs (all labs ordered are listed, but only abnormal results are displayed) Labs Reviewed - No data to display  EKG None  Radiology DG Lumbar Spine Complete  Result Date: 07/27/2021 CLINICAL DATA:  Back pain for 2 weeks EXAM: LUMBAR SPINE - COMPLETE 4+ VIEW COMPARISON:  06/14/2021 FINDINGS:  There is no evidence of lumbar spine fracture. Alignment is normal. Intervertebral disc spaces are maintained. No significant facet joint arthropathy. IMPRESSION: Negative. Electronically Signed   By: 06/16/2021 D.O.   On: 07/27/2021 16:54   DG Pelvis 1-2 Views  Result Date: 07/27/2021 CLINICAL DATA:  Hip pain for 2 weeks EXAM: PELVIS - 1-2 VIEW COMPARISON:  05/27/2021 FINDINGS: There is no evidence of pelvic fracture or diastasis. Hip joint spaces are preserved. No  pelvic bone lesions are seen. IMPRESSION: Negative. Electronically Signed   By: Duanne Guess D.O.   On: 07/27/2021 16:55    Procedures Procedures   Medications Ordered in ED Medications - No data to display  ED Course  I have reviewed the triage vital signs and the nursing notes.  Pertinent labs & imaging results that were available during my care of the patient were reviewed by me and considered in my medical decision making (see chart for details).    MDM Rules/Calculators/A&P She was evaluated by me, in no acute distress.  Physical exam seems to be similar to hers yesterday however she no longer has tingling or signs of sciatica.  She had tenderness along the paraspinal muscles of the lumbar spine as well as muscles of the hip.  She is ambulatory and still with full range of motion of both extremities.  I will discharge her with lidocaine patches as well as Robaxin.  She is unable to take NSAIDs. I put in the referral that she requested.  She is agreeable to discharge at this time.   Final Clinical Impression(s) / ED Diagnoses Final diagnoses:  Hip pain, bilateral    Rx / DC Orders ED Discharge Orders          Ordered    lidocaine (LIDODERM) 5 %  Every 24 hours,   Status:  Discontinued        07/28/21 1731    methocarbamol (ROBAXIN) 500 MG tablet  Daily PRN,   Status:  Discontinued        07/28/21 1731    methocarbamol (ROBAXIN) 500 MG tablet  Daily PRN        07/28/21 1741    lidocaine (LIDODERM) 5 %   Every 24 hours        07/28/21 1741             Neveah Bang A, PA-C 07/28/21 1823    Melene Plan, DO 07/28/21 1958

## 2021-07-28 NOTE — Discharge Instructions (Signed)
I am sending you lidocaine patches as well as a muscle relaxant to the pharmacy.  Please take these as prescribed.  I am attaching the orthopedic office that you desired to these discharge papers.  You may call them tomorrow for an appointment.  Remember that you can use up to 3 lidocaine patches at the same time however you must have 12 hours without any patches on your body.  Also tramadol is not an NSAID.  Tramadol may not help with your pain however it should not hurt your stomach.  You may look in any drugstore for heating pads that are not automatically timed.  It was a pleasure to meet you and I hope that you feel better.

## 2021-08-29 ENCOUNTER — Other Ambulatory Visit: Payer: Self-pay

## 2021-08-29 ENCOUNTER — Encounter (HOSPITAL_BASED_OUTPATIENT_CLINIC_OR_DEPARTMENT_OTHER): Payer: Self-pay | Admitting: *Deleted

## 2021-08-29 ENCOUNTER — Emergency Department (HOSPITAL_BASED_OUTPATIENT_CLINIC_OR_DEPARTMENT_OTHER)
Admission: EM | Admit: 2021-08-29 | Discharge: 2021-08-30 | Disposition: A | Discharge status: Home / Self Care | Payer: Medicaid Other | Attending: Emergency Medicine | Admitting: Emergency Medicine

## 2021-08-29 DIAGNOSIS — S8391XA Sprain of unspecified site of right knee, initial encounter: Secondary | ICD-10-CM | POA: Insufficient documentation

## 2021-08-29 DIAGNOSIS — X501XXA Overexertion from prolonged static or awkward postures, initial encounter: Secondary | ICD-10-CM | POA: Insufficient documentation

## 2021-08-29 DIAGNOSIS — F17210 Nicotine dependence, cigarettes, uncomplicated: Secondary | ICD-10-CM | POA: Diagnosis not present

## 2021-08-29 DIAGNOSIS — S39012A Strain of muscle, fascia and tendon of lower back, initial encounter: Secondary | ICD-10-CM | POA: Insufficient documentation

## 2021-08-29 DIAGNOSIS — S34109A Unspecified injury to unspecified level of lumbar spinal cord, initial encounter: Secondary | ICD-10-CM | POA: Diagnosis present

## 2021-08-29 MED ORDER — KETOROLAC TROMETHAMINE 60 MG/2ML IM SOLN
60.0000 mg | Freq: Once | INTRAMUSCULAR | Status: AC
  Administered 2021-08-29: 60 mg via INTRAMUSCULAR
  Filled 2021-08-29: qty 2

## 2021-08-29 NOTE — ED Triage Notes (Signed)
States she was getting out of bed and fell. Back pain and right knee pain since the fall.

## 2021-08-29 NOTE — Discharge Instructions (Signed)

## 2021-08-29 NOTE — ED Notes (Signed)
Pt is aware she needs a urine sample 

## 2021-08-29 NOTE — ED Provider Notes (Signed)
[facility] EMERGENCY DEPARTMENT Provider Note   CSN: [id number] Arrival date & time: 08/29/21  2239     History Chief Complaint  Patient presents with   Back Pain    Katie Spencer is a 40 y.o. female.  The history is provided by the patient.  Back Pain Location:  Lumbar spine Quality:  Aching Radiates to:  Does not radiate Pain severity:  Severe Onset quality:  Sudden Duration:  2 hours Timing:  Constant Progression:  Unchanged Chronicity:  New Context: falling   Relieved by:  Nothing Worsened by:  Ambulation Associated symptoms: no abdominal pain, no bladder incontinence, no bowel incontinence, no chest pain, no dysuria, no fever, no numbness and no weakness   Patient with history of chronic pain presents for her 13th ER visit in 6 months.  She reports getting out of bed when she fell on all fours twisted her back.  She also reports she hit her right knee during the fall.  No head injury or LOC.  No neck pain.  No new leg weakness.  No incontinence. Patient reports history of chronic back pain and has a follow-up next week with her orthopedist.  She reports she took Robaxin prior to arrival without relief.    Past Medical History:  Diagnosis Date   Abnormal vaginal bleeding    Chronic abdominal pain    Renal disorder     Patient Active Problem List   Diagnosis Date Noted   Acute bilateral low back pain without sciatica 01/10/2021   Lumbar radiculopathy 10/06/2020   Wrist sprain, left, subsequent encounter 09/22/2020    Past Surgical History:  Procedure Laterality Date   ABDOMINAL HYSTERECTOMY     CESAREAN SECTION  2006, 2009, 2013, 2014   CHOLECYSTECTOMY       OB History   No obstetric history on file.     Family History  Adopted: Yes    Social History   Tobacco Use   Smoking status: Every Day    Packs/day: 1.00    Types: Cigarettes   Smokeless tobacco: Never  Vaping Use   Vaping Use: Former   Substances: Nicotine  Substance  Use Topics   Alcohol use: Not Currently   Drug use: Never    Home Medications Prior to Admission medications   Medication Sig Start Date End Date Taking? Authorizing Provider  butalbital-acetaminophen-caffeine (FIORICET) 50-325-40 MG tablet Take 1 tablet by mouth 2 (two) times daily as needed for headache.    [provider]  citalopram (CELEXA) 40 MG tablet Take 40 mg by mouth daily. 01/01/20   [provider]  dicyclomine (BENTYL) 20 MG tablet Take 1 tablet (20 mg total) by mouth every 8 (eight) hours as needed for spasms. 12/03/20   Petrucelli, Samantha R, PA-C  hydrOXYzine (ATARAX/VISTARIL) 25 MG tablet Take 25 mg by mouth 3 (three) times daily as needed for anxiety.  04/30/20   [provider]  lidocaine (LIDODERM) 5 % Place 1 patch onto the skin daily. Remove & Discard patch within 12 hours or as directed by MD 07/28/21   Redwine, Madison A, PA-C  lidocaine (XYLOCAINE) 2 % solution Use as directed 15 mLs in the mouth or throat every 6 (six) hours as needed for mouth pain. 03/03/21   Pricilla Loveless, MD  MOBIC 15 MG tablet Take 15 mg by mouth daily as needed for pain. 10/05/20   [provider]  ondansetron (ZOFRAN ODT) 4 MG disintegrating tablet Take 1 tablet (4 mg total) by  mouth every 8 (eight) hours as needed for nausea or vomiting. 12/03/20   Petrucelli, Samantha R, PA-C  pantoprazole (PROTONIX) 40 MG tablet Take 1 tablet (40 mg total) by mouth 2 (two) times daily. 10/24/20 11/23/20  Dartha Lodge, PA-C  sucralfate (CARAFATE) 1 GM/10ML suspension Take 10 mLs (1 g total) by mouth 4 (four) times daily -  with meals and at bedtime. 03/03/21   Pricilla Loveless, MD  tamsulosin (FLOMAX) 0.4 MG CAPS capsule Take 1 capsule (0.4 mg total) by mouth daily. 10/24/20   Dartha Lodge, PA-C  VRAYLAR 6 MG CAPS Take 6 mg by mouth at bedtime.  01/02/20   [provider]  belladonna-opium (B&O SUPPRETTES) 16.2-30 MG suppository Place 1 suppository rectally every 8 (eight)  hours as needed for pain. Patient not taking: Reported on 05/27/2020 05/20/20 08/18/20  Gilda Crease, MD  lansoprazole (PREVACID) 30 MG capsule Take 1 capsule daily while taking ibuprofen. Patient not taking: Reported on 05/27/2020 04/16/20 08/18/20  Molpus, Jonny Ruiz, MD    Allergies    Morphine and related, Almond (diagnostic), and Nsaids  Review of Systems   Review of Systems  Constitutional:  Negative for fever.  Cardiovascular:  Negative for chest pain.  Gastrointestinal:  Negative for abdominal pain and bowel incontinence.  Genitourinary:  Negative for bladder incontinence and dysuria.  Musculoskeletal:  Positive for arthralgias and back pain.  Neurological:  Negative for weakness and numbness.  All other systems reviewed and are negative.  Physical Exam Updated Vital Signs BP 130/68 (BP Location: Right Arm)   Pulse 76   Temp 98.1 F (36.7 C) (Oral)   Resp 20   Ht 1.626 m (5\' 4" )   Wt 98.4 kg   LMP 04/04/2020 Comment: neg preg test  SpO2 99%   BMI 37.24 kg/m   Physical Exam CONSTITUTIONAL: Well developed/well nourished HEAD: Normocephalic/atraumatic EYES: EOMI/PERRL ENMT: Mucous membranes moist NECK: supple no meningeal signs SPINE/BACK: No cervical or thoracic tenderness, lumbar spinal and paraspinal tenderness. No bruising/crepitance/stepoffs noted to spine CV: S1/S2 noted, no murmurs/rubs/gallops noted LUNGS: Lungs are clear to auscultation bilaterally, no apparent distress ABDOMEN: soft, nontender, no rebound or guarding GU:no cva tenderness, no bruising NEURO: Awake/alert,  equal motor 5/5 strength noted with the following: hip flexion/knee flexion/extension, foot dorsi/plantar flexion, great toe extension intact bilaterally, no sensory deficit in any dermatome.  .  Pt is able to ambulate   With antalgic gait EXTREMITIES: pulses normal, full ROM, mild tenderness to right knee, no deformities or bruising SKIN: warm, color normal PSYCH: Anxious ED Results /  Procedures / Treatments   Labs (all labs ordered are listed, but only abnormal results are displayed) Labs Reviewed - No data to display  EKG None  Radiology No results found.  Procedures Procedures   Medications Ordered in ED Medications  ketorolac (TORADOL) injection 60 mg (60 mg Intramuscular Given 08/29/21 2351)    ED Course  I have reviewed the triage vital signs and the nursing notes.     MDM Rules/Calculators/A&P                           Patient fell from standing on all fours since that she twisted her back.  No signs of acute spinal traumatic injury.  No neurodeficits.  No signs of any bony injury to the right knee. Patient requests injection of Toradol here advised her to continue to take her Robaxin at home. Final Clinical Impression(s) / ED Diagnoses  Final diagnoses:  Strain of lumbar region, initial encounter  Sprain of right knee, unspecified ligament, initial encounter    Rx / DC Orders ED Discharge Orders     None        Zadie Rhine, MD 08/30/21 0002

## 2021-09-21 ENCOUNTER — Emergency Department (HOSPITAL_BASED_OUTPATIENT_CLINIC_OR_DEPARTMENT_OTHER)
Admission: EM | Admit: 2021-09-21 | Discharge: 2021-09-21 | Disposition: A | Discharge status: Home / Self Care | Payer: Medicaid Other | Attending: Emergency Medicine | Admitting: Emergency Medicine

## 2021-09-21 ENCOUNTER — Other Ambulatory Visit: Payer: Self-pay

## 2021-09-21 ENCOUNTER — Encounter (HOSPITAL_BASED_OUTPATIENT_CLINIC_OR_DEPARTMENT_OTHER): Payer: Self-pay | Admitting: *Deleted

## 2021-09-21 ENCOUNTER — Encounter (HOSPITAL_BASED_OUTPATIENT_CLINIC_OR_DEPARTMENT_OTHER): Payer: Self-pay

## 2021-09-21 ENCOUNTER — Emergency Department (HOSPITAL_BASED_OUTPATIENT_CLINIC_OR_DEPARTMENT_OTHER)
Admission: EM | Admit: 2021-09-21 | Discharge: 2021-09-22 | Disposition: A | Discharge status: Home / Self Care | Payer: Medicaid Other | Source: Home / Self Care | Attending: Emergency Medicine | Admitting: Emergency Medicine

## 2021-09-21 DIAGNOSIS — R531 Weakness: Secondary | ICD-10-CM | POA: Diagnosis not present

## 2021-09-21 DIAGNOSIS — R200 Anesthesia of skin: Secondary | ICD-10-CM | POA: Insufficient documentation

## 2021-09-21 DIAGNOSIS — G540 Brachial plexus disorders: Secondary | ICD-10-CM | POA: Insufficient documentation

## 2021-09-21 DIAGNOSIS — M62838 Other muscle spasm: Secondary | ICD-10-CM | POA: Diagnosis not present

## 2021-09-21 DIAGNOSIS — R202 Paresthesia of skin: Secondary | ICD-10-CM

## 2021-09-21 DIAGNOSIS — M549 Dorsalgia, unspecified: Secondary | ICD-10-CM | POA: Diagnosis not present

## 2021-09-21 MED ORDER — KETOROLAC TROMETHAMINE 60 MG/2ML IM SOLN
30.0000 mg | Freq: Once | INTRAMUSCULAR | Status: AC
  Administered 2021-09-21: 30 mg via INTRAMUSCULAR
  Filled 2021-09-21: qty 2

## 2021-09-21 MED ORDER — PREDNISONE 10 MG PO TABS: 20.0000 mg | ORAL_TABLET | Freq: Every day | ORAL | 0 refills | Status: DC

## 2021-09-21 MED ORDER — DEXAMETHASONE SODIUM PHOSPHATE 10 MG/ML IJ SOLN
10.0000 mg | Freq: Once | INTRAMUSCULAR | Status: AC
Start: 2021-09-21 — End: 2021-09-21
  Administered 2021-09-21: 10 mg via INTRAMUSCULAR
  Filled 2021-09-21: qty 1

## 2021-09-21 NOTE — ED Triage Notes (Addendum)
Numbness on left side w sharp pain radiating into arm and legs since dec 18,   was in hospital for same, back pain,  has herniated disc and bulging disc in back

## 2021-09-21 NOTE — ED Provider Notes (Signed)
[facility]ER[facility]ENC[facility] DEPARTMENT Provider Note   CSN: [id number] Arrival date & time: 09/21/21  1025     History  Chief Complaint  Patient presents with   Numbness    Katie Spencer is a 41 y.o. female.  HPI  Patient presents with left-sided pain and numbness.  She has been having this pain constantly since 12/18.  She had an MRI cervical spine done 12/19 which showed protruding and herniated disc.  Yesterday she had a lumbar MRI performed which showed possible compression of the S1 nerve.  She has an appointment with her neurosurgeon January 10.  Patient reports that last night her pain acutely became worse, it was slightly alleviated by gabapentin and Flexeril.  She woke up this morning in excruciating pain, she is able to walk this aggravates the pain.  She is not having any numbness in her groin, denies any bilateral weakness.  No recent injuries to the back, no history of spinal surgeries previously.  No urinary retention or incontinence, no fevers at home.  Home Medications Prior to Admission medications   Medication Sig Start Date End Date Taking? Authorizing Provider  butalbital-acetaminophen-caffeine (FIORICET) 50-325-40 MG tablet Take 1 tablet by mouth 2 (two) times daily as needed for headache.    [provider]  citalopram (CELEXA) 40 MG tablet Take 40 mg by mouth daily. 01/01/20   [provider]  dicyclomine (BENTYL) 20 MG tablet Take 1 tablet (20 mg total) by mouth every 8 (eight) hours as needed for spasms. 12/03/20   Petrucelli, Samantha R, PA-C  hydrOXYzine (ATARAX/VISTARIL) 25 MG tablet Take 25 mg by mouth 3 (three) times daily as needed for anxiety.  04/30/20   [provider]  lidocaine (LIDODERM) 5 % Place 1 patch onto the skin daily. Remove & Discard patch within 12 hours or as directed by MD 07/28/21   Redwine, Madison A, PA-C  lidocaine (XYLOCAINE) 2 % solution Use as directed 15 mLs in the mouth or throat every 6 (six) hours  as needed for mouth pain. 03/03/21   Sherwood Gambler, MD  MOBIC 15 MG tablet Take 15 mg by mouth daily as needed for pain. 10/05/20   [provider]  ondansetron (ZOFRAN ODT) 4 MG disintegrating tablet Take 1 tablet (4 mg total) by mouth every 8 (eight) hours as needed for nausea or vomiting. 12/03/20   Petrucelli, Samantha R, PA-C  pantoprazole (PROTONIX) 40 MG tablet Take 1 tablet (40 mg total) by mouth 2 (two) times daily. 10/24/20 11/23/20  Jacqlyn Larsen, PA-C  sucralfate (CARAFATE) 1 GM/10ML suspension Take 10 mLs (1 g total) by mouth 4 (four) times daily -  with meals and at bedtime. 03/03/21   Sherwood Gambler, MD  tamsulosin (FLOMAX) 0.4 MG CAPS capsule Take 1 capsule (0.4 mg total) by mouth daily. 10/24/20   Jacqlyn Larsen, PA-C  VRAYLAR 6 MG CAPS Take 6 mg by mouth at bedtime.  01/02/20   [provider]  belladonna-opium (B&O SUPPRETTES) 16.2-30 MG suppository Place 1 suppository rectally every 8 (eight) hours as needed for pain. Patient not taking: Reported on 05/27/2020 05/20/20 08/18/20  Orpah Greek, MD  lansoprazole (PREVACID) 30 MG capsule Take 1 capsule daily while taking ibuprofen. Patient not taking: Reported on 05/27/2020 04/16/20 08/18/20  Molpus, Jenny Reichmann, MD      Allergies    Morphine and related, Almond (diagnostic), and Nsaids    Review of Systems   Review of Systems  Constitutional:  Negative for fever.  Eyes:  Negative for visual disturbance.  Genitourinary:  Negative for decreased urine volume, dysuria, flank pain, frequency and hematuria.       No urinary retention   Musculoskeletal:  Positive for back pain.  Skin:  Negative for rash.  Allergic/Immunologic: Negative for immunocompromised state.  Neurological:  Positive for weakness and numbness. Negative for dizziness and syncope.       No saddle anesthesia, no bilateral leg numbness    Physical Exam Updated Vital Signs BP 119/65 (BP Location: Right Arm)    Pulse 97    Temp 98.5 F (36.9 C) (Oral)     Resp 16    Ht 5\' 4"  (1.626 m)    Wt 101.2 kg    LMP 04/04/2020 Comment: neg preg test   SpO2 99%    BMI 38.28 kg/m  Physical Exam Vitals and nursing note reviewed. Exam conducted with a chaperone present.  Constitutional:      Appearance: Normal appearance.  HENT:     Head: Normocephalic.  Eyes:     Extraocular Movements: Extraocular movements intact.     Pupils: Pupils are equal, round, and reactive to light.     Comments: No nystagmus   Neck:     Comments: No midline cervical tenderness. No palpable deformities.  Cardiovascular:     Rate and Rhythm: Normal rate and regular rhythm.     Pulses: Normal pulses.     Comments: DP, PT, and radial pulses 2+ and symmetrical bilaterally Pulmonary:     Effort: Pulmonary effort is normal.     Breath sounds: Normal breath sounds.  Abdominal:     Tenderness: There is no right CVA tenderness or left CVA tenderness.  Musculoskeletal:        General: Tenderness present.     Cervical back: Normal range of motion. No rigidity or tenderness.     Comments: Pain reproduced with left leg raise, no midline tenderness  Skin:    General: Skin is warm and dry.     Capillary Refill: Capillary refill takes less than 2 seconds.     Findings: No bruising or erythema.  Neurological:     Mental Status: She is alert and oriented to person, place, and time. Mental status is at baseline.     Comments: Patient is alert, oriented to personal, place and time with normal speech. Cranial nerves III-XII grossly in tact. Grip strength equal bilaterally LE strength equal bilaterally. Sensation to light touch in tact bilaterally.  Patient is ambulatory.   Psychiatric:        Mood and Affect: Mood normal.   ED Results / Procedures / Treatments   Labs (all labs ordered are listed, but only abnormal results are displayed) Labs Reviewed - No data to display  EKG None  Radiology No results found.  Procedures Procedures    Medications Ordered in ED Medications   dexamethasone (DECADRON) injection 10 mg (has no administration in time range)    ED Course/ Medical Decision Making/ A&P                           Medical Decision Making Back pain most consistent with musculoskeletal spasm/strain.  Patient is neurologically intact, no focal deficits on exam. Patient is ambulatory.   No red flag symptoms - history of malignancy, prior spinal surgeries, IV drug use, severe trauma, neurologic deficits, fevers, prolonged corticosteroid use.  Doubt cauda equina - no bladder or bowel retention/incontinence, bilateral leg numbness  or weakness, saddle anesthesia. Doubt osteomyelitis or epidural abscess - no history of IVDU, fever, vertebral tenderness Doubt pyelonephritis - no CVAT, urinary complaints Doubt dissection - equal peripheral pulses, no tachycardia, story not consistent    Patient given prednisone taper as well as IM injection for now.  We discussed return precautions, she has excellent follow-up and an appointment coming up next week with neurosurgery.  I do not feel this time she needs additional work-up.  Amount and/or Complexity of Data Reviewed External Data Reviewed: radiology and notes.    Details: Prior notes including imaging Of her MRI C-spine and lumbar spine were reviewed.  Risk OTC drugs. Prescription drug management. Decision regarding hospitalization.           Final Clinical Impression(s) / ED Diagnoses Final diagnoses:  None    Rx / DC Orders ED Discharge Orders     None         Sherrill Raring, Hershal Coria 09/21/21 2017    Gareth Morgan, MD 09/21/21 2203

## 2021-09-21 NOTE — ED Notes (Signed)
D/c paperwork reviewed with pt, including prescriptions and f/u care. Pt with no questions or concerns at time of d/c. Ambulatory to ED exit without assistance, NAD.

## 2021-09-21 NOTE — ED Notes (Signed)
Pt ambulatory to bathroom, steady gait

## 2021-09-21 NOTE — Discharge Instructions (Signed)
Take 40 mg of prednisone for 5 days starting in 3 days from now if you are still having pain.  Continue can be gabapentin.  Call your neurologist with notes going on.  Return if symptoms change or worsen.

## 2021-09-21 NOTE — ED Triage Notes (Addendum)
Pt states she was seen here today and advised to come back to ED prn-c/o pain to left leg and lower back pain and numbness to left arm when she turned today-NAD-steady gait

## 2021-09-21 NOTE — ED Notes (Signed)
ED Provider at bedside. 

## 2021-09-21 NOTE — ED Provider Notes (Signed)
[facility] EMERGENCY DEPARTMENT Provider Note  CSN: [id number] Arrival date & time: 09/21/21 2026  Chief Complaint(s) Leg Pain and Numbness  HPI Katie Spencer is a 41 y.o. female    Extremity Weakness This is a new problem. The current episode started 3 to 5 hours ago. The problem occurs constantly. The problem has been gradually improving. Pertinent negatives include no chest pain, no abdominal pain, no headaches and no shortness of breath. The symptoms are aggravated by walking. She has tried nothing for the symptoms.   Patient was seen earlier in the day for left leg numbness.  That improved with steroid shot.  Returned after she got home.  Reports that she was breaking up a fight between her dogs when she felt a pop in her neck and felt her left upper extremity go numb and weak.  She has severe upper back pain.  Since waiting to be seen, her weakness has been improving reports that she has been moving her left arm more.  Still feels like its numb.  Past Medical History Past Medical History:  Diagnosis Date   Abnormal vaginal bleeding    Chronic abdominal pain    Renal disorder    Patient Active Problem List   Diagnosis Date Noted   Acute bilateral low back pain without sciatica 01/10/2021   Lumbar radiculopathy 10/06/2020   Wrist sprain, left, subsequent encounter 09/22/2020   Home Medication(s) Prior to Admission medications   Medication Sig Start Date End Date Taking? Authorizing Provider  butalbital-acetaminophen-caffeine (FIORICET) 50-325-40 MG tablet Take 1 tablet by mouth 2 (two) times daily as needed for headache.    [provider]  citalopram (CELEXA) 40 MG tablet Take 40 mg by mouth daily. 01/01/20   [provider]  dicyclomine (BENTYL) 20 MG tablet Take 1 tablet (20 mg total) by mouth every 8 (eight) hours as needed for spasms. 12/03/20   Petrucelli, Samantha R, PA-C  hydrOXYzine (ATARAX/VISTARIL) 25 MG tablet Take 25 mg by mouth 3  (three) times daily as needed for anxiety.  04/30/20   [provider]  lidocaine (LIDODERM) 5 % Place 1 patch onto the skin daily. Remove & Discard patch within 12 hours or as directed by MD 07/28/21   Redwine, Madison A, PA-C  lidocaine (XYLOCAINE) 2 % solution Use as directed 15 mLs in the mouth or throat every 6 (six) hours as needed for mouth pain. 03/03/21   Pricilla Loveless, MD  MOBIC 15 MG tablet Take 15 mg by mouth daily as needed for pain. 10/05/20   [provider]  ondansetron (ZOFRAN ODT) 4 MG disintegrating tablet Take 1 tablet (4 mg total) by mouth every 8 (eight) hours as needed for nausea or vomiting. 12/03/20   Petrucelli, Samantha R, PA-C  pantoprazole (PROTONIX) 40 MG tablet Take 1 tablet (40 mg total) by mouth 2 (two) times daily. 10/24/20 11/23/20  Dartha Lodge, PA-C  predniSONE (DELTASONE) 10 MG tablet Take 2 tablets (20 mg total) by mouth daily. 09/21/21   Theron Arista, PA-C  sucralfate (CARAFATE) 1 GM/10ML suspension Take 10 mLs (1 g total) by mouth 4 (four) times daily -  with meals and at bedtime. 03/03/21   Pricilla Loveless, MD  tamsulosin (FLOMAX) 0.4 MG CAPS capsule Take 1 capsule (0.4 mg total) by mouth daily. 10/24/20   Dartha Lodge, PA-C  VRAYLAR 6 MG CAPS Take 6 mg by mouth at bedtime.  01/02/20   [provider]  belladonna-opium (B&O SUPPRETTES) 16.2-30 MG suppository  Place 1 suppository rectally every 8 (eight) hours as needed for pain. Patient not taking: Reported on 05/27/2020 05/20/20 08/18/20  Gilda Crease, MD  lansoprazole (PREVACID) 30 MG capsule Take 1 capsule daily while taking ibuprofen. Patient not taking: Reported on 05/27/2020 04/16/20 08/18/20  Molpus, Jonny Ruiz, MD                                                                                                                                    Allergies Morphine and related, Almond (diagnostic), and Nsaids  Review of Systems Review of Systems  Respiratory:  Negative for shortness of  breath.   Cardiovascular:  Negative for chest pain.  Gastrointestinal:  Negative for abdominal pain.  Musculoskeletal:  Positive for extremity weakness.  Neurological:  Negative for headaches.  As noted in HPI  Physical Exam Vital Signs  I have reviewed the triage vital signs BP 126/77    Pulse 81    Temp 98.4 F (36.9 C) (Oral)    Resp 19    Ht 5\' 4"  (1.626 m)    Wt 100.7 kg    LMP 04/04/2020 Comment: neg preg test   SpO2 95%    BMI 38.11 kg/m   Physical Exam Vitals reviewed.  Constitutional:      General: She is not in acute distress.    Appearance: She is well-developed. She is not diaphoretic.  HENT:     Head: Normocephalic and atraumatic.     Right Ear: External ear normal.     Left Ear: External ear normal.     Nose: Nose normal.  Eyes:     General: No scleral icterus.    Conjunctiva/sclera: Conjunctivae normal.  Neck:     Trachea: Phonation normal.  Cardiovascular:     Rate and Rhythm: Normal rate and regular rhythm.  Pulmonary:     Effort: Pulmonary effort is normal. No respiratory distress.     Breath sounds: No stridor.  Abdominal:     General: There is no distension.  Musculoskeletal:        General: Normal range of motion.     Cervical back: Normal range of motion. Spasms and tenderness present. No bony tenderness.     Thoracic back: Spasms and tenderness present.       Back:     Comments: Diminished strength related to severity of pain. She has good strength intermittently during isolated muscle group exams. Noted to be moving more freely after initial exam.  Neurological:     Mental Status: She is alert and oriented to person, place, and time.  Psychiatric:        Behavior: Behavior normal.    ED Results and Treatments Labs (all labs ordered are listed, but only abnormal results are displayed) Labs Reviewed - No data to display  EKG   EKG Interpretation  Date/Time:    Ventricular Rate:    PR Interval:    QRS Duration:   QT Interval:    QTC Calculation:   R Axis:     Text Interpretation:         Radiology No results found.  Pertinent labs & imaging results that were available during my care of the patient were reviewed by me and considered in my medical decision making (see MDM for details).  Medications Ordered in ED Medications  ketorolac (TORADOL) injection 30 mg (30 mg Intramuscular Given 09/21/21 2349)                                                                                                                                     Procedures Procedures  (including critical care time)  Medical Decision Making / ED Course     Left upper extremity numbness and weakness associated with left upper back pain. Noted to have muscle spasm. Discussed possibility of herniated disc versus impinged nerves due to the muscle spasms. Given her gradual improvement since onset, even more so after examination, I believe that it is more likely that she has impinged peripheral nerves related to the muscle spasms.  I have low suspicion for CVA. Patient already established with spine doctor and has follow-up within the next 2 to 3 weeks. Recommended supportive management.   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 Safe for discharge with strict return precautions.  Disposition: Discharge  Condition: Good  I have discussed the results, Dx and Tx plan with the patient/family who expressed understanding and agree(s) with the plan. Discharge instructions discussed at length. The patient/family  was given strict return precautions who verbalized understanding of the instructions. No further questions at time of discharge.    ED Discharge Orders     None        Follow Up: Loyal JacobsonKalish, Michael, MD [address] [place] [id number] [phone]  Call  as needed  Spine doctor  Go to  as scheduled            This chart was dictated using voice recognition software.  Despite best efforts to proofread,  errors can occur which can change the documentation meaning.    Nira Connardama, Larson Limones Eduardo, MD 09/22/21 [phone]

## 2021-09-22 NOTE — ED Notes (Signed)
D/c paperwork reviewed with pt.  All questions addressed prior to d/c. Pt comfortable to ambulate out of ED, steady gait, NAD.

## 2021-09-22 NOTE — Discharge Instructions (Addendum)
You may use over-the-counter Motrin (Ibuprofen), Acetaminophen (Tylenol), topical muscle creams such as SalonPas, Icy Hot, Bengay, etc. Please stretch, apply ice or heat (whichever helps), and have massage therapy for additional assistance.  

## 2021-10-28 IMAGING — CR DG ABDOMEN ACUTE W/ 1V CHEST
5 series · 5 of 5 positions shown · non-contrast
Comparison: Radiograph dated 12/03/2020. CT abdomen pelvis dated
11/15/2020.

CLINICAL DATA: 39-year-old female with upper abdominal pain.

EXAM:
DG ABDOMEN ACUTE WITH 1 VIEW CHEST

[w chest pa]
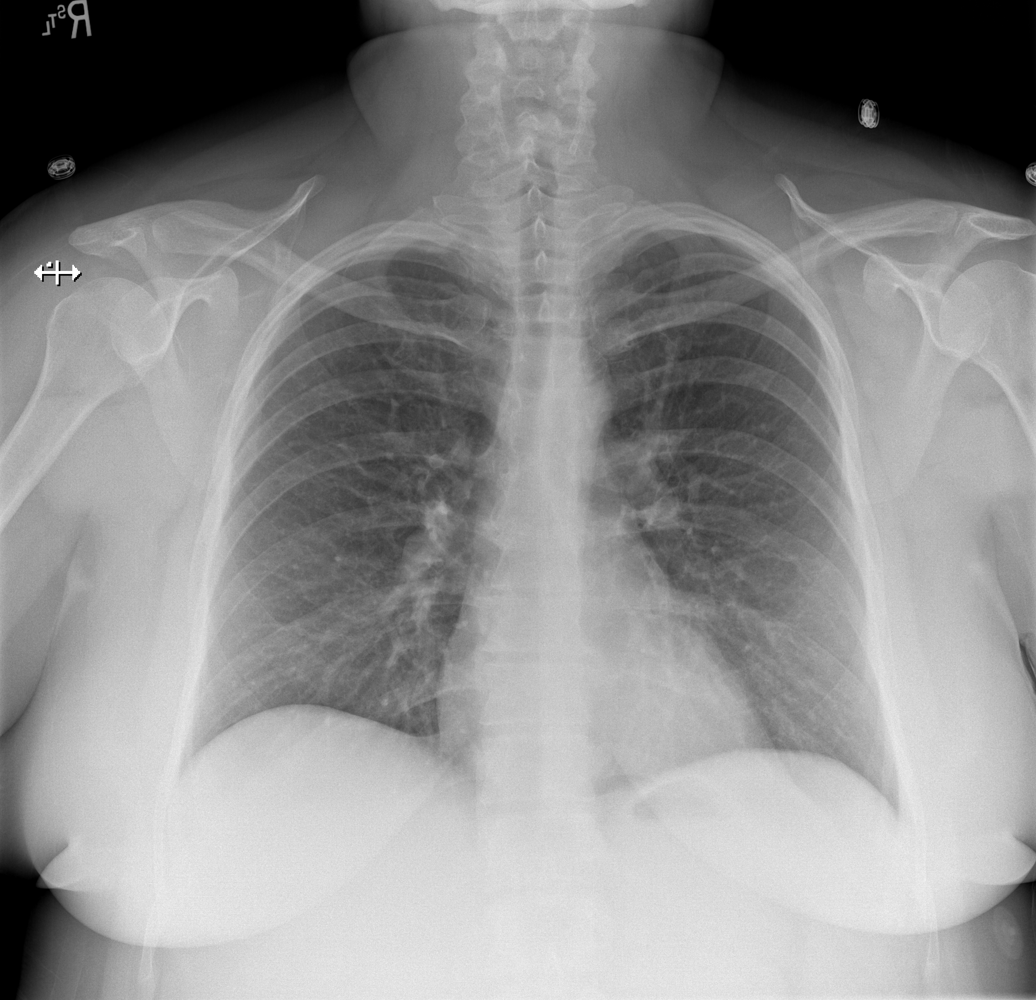

[w abdomen upright]
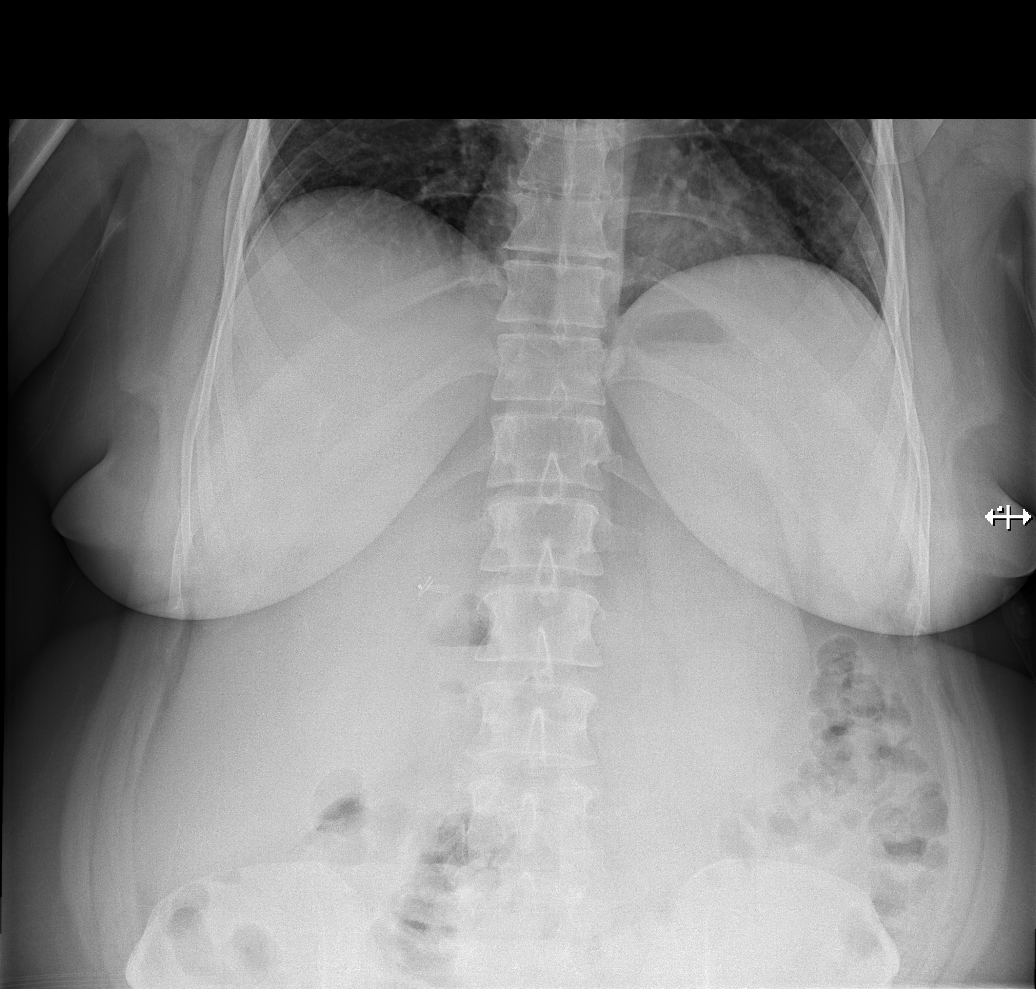

[t abdomen supine (1 of 3)]
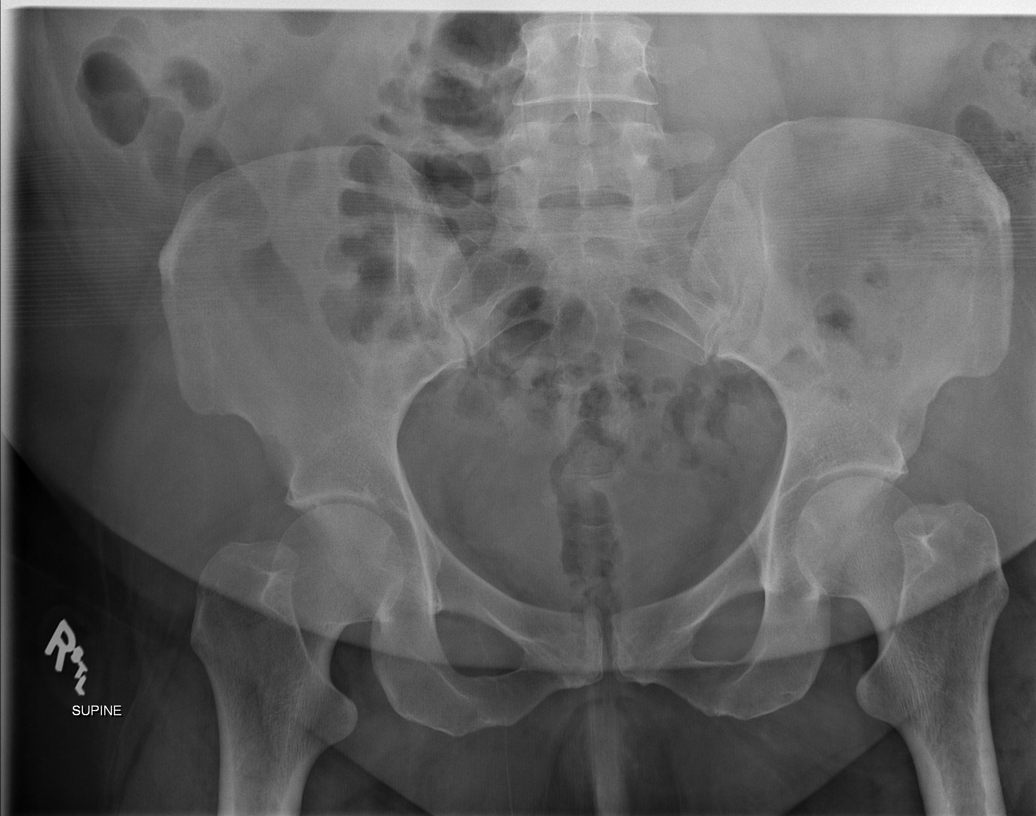

[t abdomen supine (2 of 3)]
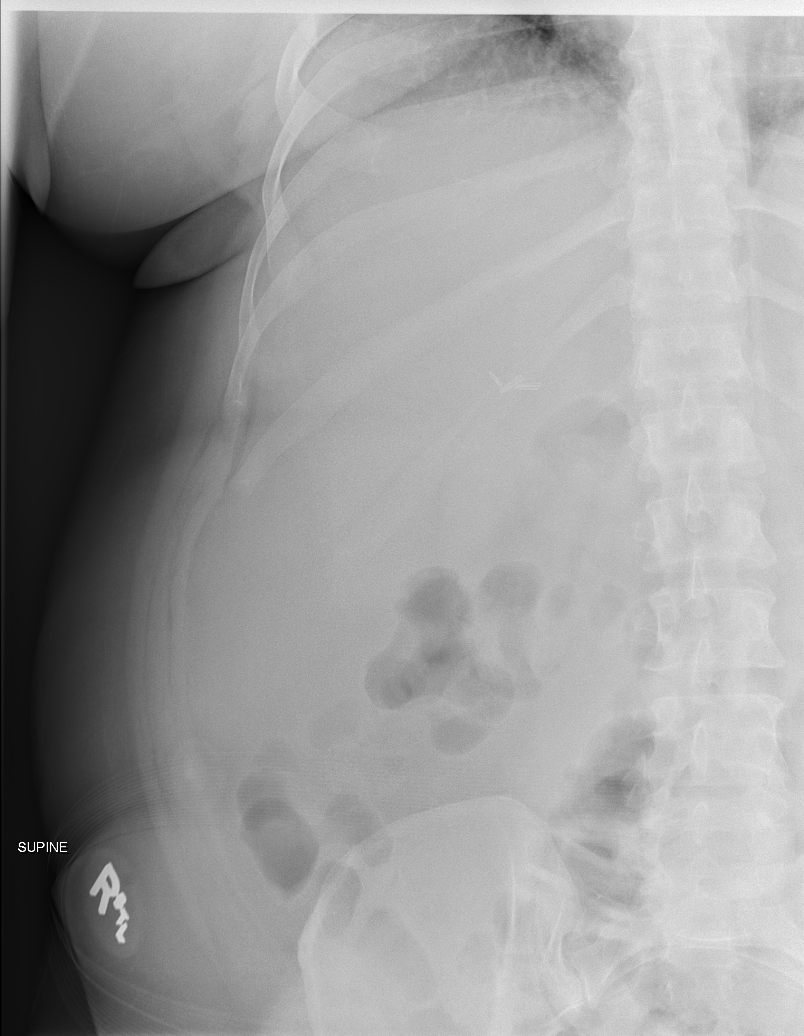

[t abdomen supine (3 of 3)]
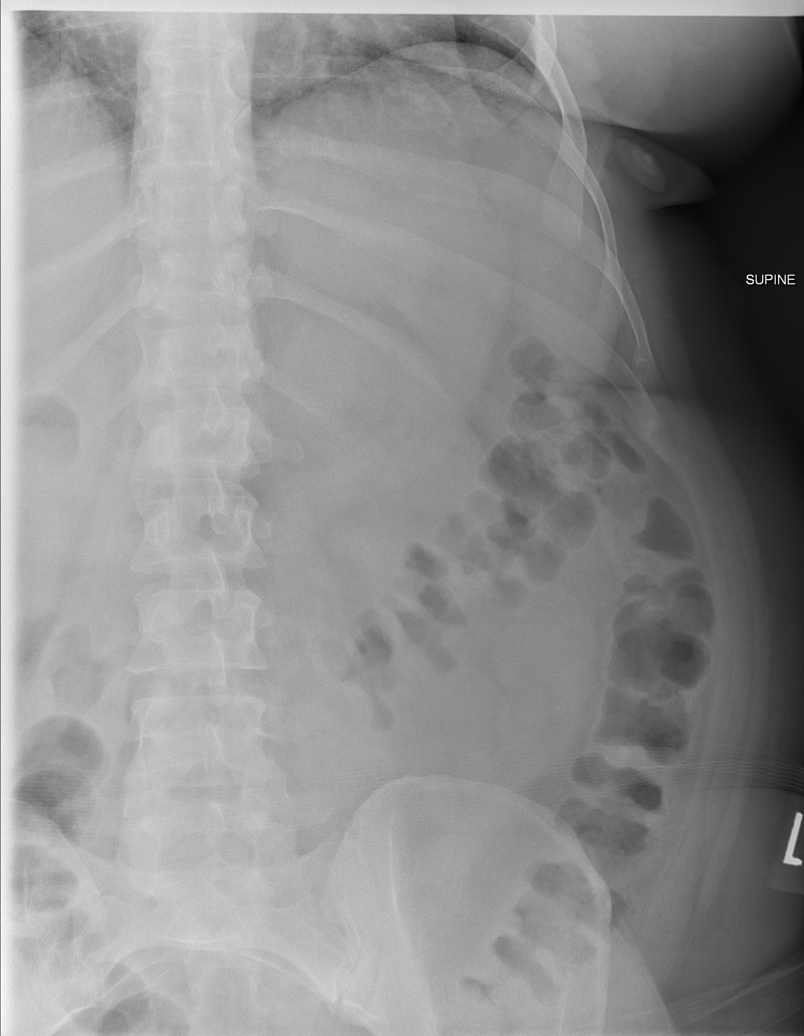

[5 of 5 positions shown; findings below may reference images not displayed]

FINDINGS: The lungs are clear. There is no pleural effusion pneumothorax. The
cardiac silhouette is within limits.

There is no bowel dilatation or evidence of obstruction. No free
air. Faint 3 mm radiopaque focus over the inferior right renal
silhouette consistent with a small kidney stone. Right upper
quadrant cholecystectomy clips. The osseous structures and soft
tissues are grossly unremarkable.
IMPRESSION: 1. No acute cardiopulmonary process.
2. No evidence of bowel obstruction.
3. A 3 mm right renal inferior pole calculus.

## 2021-11-01 ENCOUNTER — Encounter (HOSPITAL_BASED_OUTPATIENT_CLINIC_OR_DEPARTMENT_OTHER): Payer: Self-pay | Admitting: Emergency Medicine

## 2021-11-01 ENCOUNTER — Emergency Department (HOSPITAL_BASED_OUTPATIENT_CLINIC_OR_DEPARTMENT_OTHER)
Admission: EM | Admit: 2021-11-01 | Discharge: 2021-11-01 | Disposition: A | Discharge status: Home / Self Care | Payer: Medicaid Other | Attending: Emergency Medicine | Admitting: Emergency Medicine

## 2021-11-01 ENCOUNTER — Other Ambulatory Visit: Payer: Self-pay

## 2021-11-01 ENCOUNTER — Emergency Department (HOSPITAL_BASED_OUTPATIENT_CLINIC_OR_DEPARTMENT_OTHER): Payer: Medicaid Other

## 2021-11-01 DIAGNOSIS — F17210 Nicotine dependence, cigarettes, uncomplicated: Secondary | ICD-10-CM | POA: Insufficient documentation

## 2021-11-01 DIAGNOSIS — G8929 Other chronic pain: Secondary | ICD-10-CM | POA: Insufficient documentation

## 2021-11-01 DIAGNOSIS — S3992XA Unspecified injury of lower back, initial encounter: Secondary | ICD-10-CM | POA: Diagnosis present

## 2021-11-01 DIAGNOSIS — M542 Cervicalgia: Secondary | ICD-10-CM | POA: Insufficient documentation

## 2021-11-01 DIAGNOSIS — W108XXA Fall (on) (from) other stairs and steps, initial encounter: Secondary | ICD-10-CM | POA: Insufficient documentation

## 2021-11-01 DIAGNOSIS — R109 Unspecified abdominal pain: Secondary | ICD-10-CM | POA: Insufficient documentation

## 2021-11-01 MED ORDER — OXYCODONE-ACETAMINOPHEN 5-325 MG PO TABS
2.0000 | ORAL_TABLET | Freq: Once | ORAL | Status: AC
  Administered 2021-11-01: 2 via ORAL
  Filled 2021-11-01: qty 2

## 2021-11-01 NOTE — ED Triage Notes (Signed)
Pt states she fell down her stairs Monday morning  Pt states she fell down about 7 or 8 steps  States her feet came out from under her and she fell down the steps on her butt  Pt is c/o lower back pain   Pt has chronic back pain  Pt adds that she has been taking her prescribed medication without relief   Pt also adds that she is very gassy since she fell

## 2021-11-01 NOTE — ED Notes (Signed)
Patient taken to xray at this time.

## 2021-11-01 NOTE — ED Provider Notes (Signed)
[facility] Provider Note: [name], MD, FACEP  CSN: [id number] MRN: [id number] ARRIVAL: 11/01/21 at 0248 ROOM: [facility]01/[facility]01   CHIEF COMPLAINT  Fall   HISTORY OF PRESENT ILLNESS  11/01/21 3:03 AM Katie Spencer is a 41 y.o. female with chronic neck, back and abdominal pain.  She fell down about 7 or 8 stairs yesterday morning when her feet came out from under her.  She landed on her buttocks.  She is having pain in her lower back and coccyx which she rates as a 10 out of 10, aching in nature.  It is worse with sitting or with movement.  She has been taking her chronic narcotics without relief.  She is also felt her abdomen has been distended and "gassy" since the fall.  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   Past Medical History:  Diagnosis Date   Abnormal vaginal bleeding    Chronic abdominal pain    Renal disorder     Past Surgical History:  Procedure Laterality Date   ABDOMINAL HYSTERECTOMY     CESAREAN SECTION  2006, 2009, 2013, 2014   CHOLECYSTECTOMY      Family History  Adopted: Yes  Problem Relation Age of Onset   Cancer Mother    Cancer Other     Social History   Tobacco Use   Smoking status: Every Day    Packs/day: 1.00    Types: Cigarettes   Smokeless tobacco: Never  Vaping Use   Vaping Use: Some days   Substances: Nicotine  Substance Use Topics   Alcohol use: Not Currently   Drug use: Never    Prior to Admission medications   Medication Sig Start Date End Date Taking? Authorizing Provider  butalbital-acetaminophen-caffeine (FIORICET) 50-325-40 MG tablet Take 1 tablet by mouth 2 (two) times daily as needed for headache.    [provider]  citalopram (CELEXA) 40 MG tablet Take 40 mg by mouth daily. 01/01/20   [provider]  dicyclomine (BENTYL) 20 MG tablet Take 1 tablet (20 mg total) by mouth every 8 (eight) hours as needed for  spasms. 12/03/20   Petrucelli, Samantha R, PA-C  hydrOXYzine (ATARAX/VISTARIL) 25 MG tablet Take 25 mg by mouth 3 (three) times daily as needed for anxiety.  04/30/20   [provider]  lidocaine (LIDODERM) 5 % Place 1 patch onto the skin daily. Remove & Discard patch within 12 hours or as directed by MD 07/28/21   Redwine, Madison A, PA-C  lidocaine (XYLOCAINE) 2 % solution Use as directed 15 mLs in the mouth or throat every 6 (six) hours as needed for mouth pain. 03/03/21   Pricilla Loveless, MD  MOBIC 15 MG tablet Take 15 mg by mouth daily as needed for pain. 10/05/20   [provider]  ondansetron (ZOFRAN ODT) 4 MG disintegrating tablet Take 1 tablet (4 mg total) by mouth every 8 (eight) hours as needed for nausea or vomiting. 12/03/20   Petrucelli, Samantha R, PA-C  pantoprazole (PROTONIX) 40 MG tablet Take 1 tablet (40 mg total) by mouth 2 (two) times daily. 10/24/20 11/23/20  Dartha Lodge, PA-C  predniSONE (DELTASONE) 10 MG tablet Take 2 tablets (20 mg total) by mouth daily. 09/21/21   Theron Arista, PA-C  sucralfate (CARAFATE) 1 GM/10ML suspension Take 10 mLs (1 g total) by mouth 4 (four) times daily -  with meals and at bedtime. 03/03/21   Pricilla Loveless,  MD  tamsulosin (FLOMAX) 0.4 MG CAPS capsule Take 1 capsule (0.4 mg total) by mouth daily. 10/24/20   Dartha Lodge, PA-C  VRAYLAR 6 MG CAPS Take 6 mg by mouth at bedtime.  01/02/20   [provider]  belladonna-opium (B&O SUPPRETTES) 16.2-30 MG suppository Place 1 suppository rectally every 8 (eight) hours as needed for pain. Patient not taking: Reported on 05/27/2020 05/20/20 08/18/20  Gilda Crease, MD  lansoprazole (PREVACID) 30 MG capsule Take 1 capsule daily while taking ibuprofen. Patient not taking: Reported on 05/27/2020 04/16/20 08/18/20  Deshundra Waller, MD    Allergies Morphine and related, Almond (diagnostic), and Nsaids   REVIEW OF SYSTEMS  Negative except as noted here or in the History of Present  Illness.   PHYSICAL EXAMINATION  Initial Vital Signs Blood pressure (!) 142/92, pulse 80, temperature 98 F (36.7 C), temperature source Oral, resp. rate 16, height 5\' 4"  (1.626 m), weight 104.3 kg, last menstrual period 04/04/2020, SpO2 100 %.  Examination General: Well-developed, well-nourished female in no acute distress; appearance consistent with age of record HENT: normocephalic; atraumatic Eyes: pupils equal, round and reactive to light; extraocular muscles intact Neck: supple; nontender Heart: regular rate and rhythm Lungs: clear to auscultation bilaterally Abdomen: soft; mildly distended; nontender; bowel sounds present Back: Lumbar and coccygeal tenderness Extremities: No deformity; full range of motion except hips due to pain; pulses normal Neurologic: Awake, alert and oriented; motor function intact in all extremities and symmetric; no facial droop Skin: Warm and dry Psychiatric: Normal mood and affect   RESULTS  Summary of this visit's results, reviewed and interpreted by myself:   EKG Interpretation  Date/Time:    Ventricular Rate:    PR Interval:    QRS Duration:   QT Interval:    QTC Calculation:   R Axis:     Text Interpretation:         Laboratory Studies: No results found for this or any previous visit (from the past 24 hour(s)). Imaging Studies: DG Lumbar Spine Complete  Result Date: 11/01/2021 CLINICAL DATA:  Fall, low back pain EXAM: LUMBAR SPINE - COMPLETE 4+ VIEW COMPARISON:  10/16/2021 FINDINGS: There is no evidence of lumbar spine fracture. Alignment is normal. Intervertebral disc spaces are maintained. IMPRESSION: Negative. Electronically Signed   By: Charlett Nose M.D.   On: 11/01/2021 03:50   DG Sacrum/Coccyx  Result Date: 11/01/2021 CLINICAL DATA:  Fall, low back pain EXAM: SACRUM AND COCCYX - 2+ VIEW COMPARISON:  07/27/2021 FINDINGS: There is no evidence of fracture or other focal bone lesions. SI joints symmetric and unremarkable.  IMPRESSION: Negative. Electronically Signed   By: Charlett Nose M.D.   On: 11/01/2021 03:50    ED COURSE and MDM  Nursing notes, initial and subsequent vitals signs, including pulse oximetry, reviewed and interpreted by myself.  Vitals:   11/01/21 0256 11/01/21 0259  BP:  (!) 142/92  Pulse:  80  Resp:  16  Temp:  98 F (36.7 C)  TempSrc:  Oral  SpO2:  100%  Weight: 104.3 kg   Height: 5\' 4"  (1.626 m)    Medications  oxyCODONE-acetaminophen (PERCOCET/ROXICET) 5-325 MG per tablet 2 tablet (has no administration in time range)    No evidence of bony injury on radiographs.  This likely represents an exacerbation of the patient's chronic pain although the coccydynia could be due to new injury.  PROCEDURES  Procedures   ED DIAGNOSES     ICD-10-CM   1. Fall down stairs,  initial encounter  W10.8XXA     2. Back injury, initial encounter  S39.92XA     3. Injury of coccyx, initial encounter  S39.92XA          Tonya Wantz, Jonny Ruiz, MD 11/01/21 0403

## 2021-11-17 ENCOUNTER — Emergency Department (HOSPITAL_COMMUNITY)
Admission: EM | Admit: 2021-11-17 | Discharge: 2021-11-18 | Disposition: A | Discharge status: Home / Self Care | Payer: Medicaid Other | Attending: Emergency Medicine | Admitting: Emergency Medicine

## 2021-11-17 ENCOUNTER — Other Ambulatory Visit: Payer: Self-pay

## 2021-11-17 ENCOUNTER — Emergency Department (HOSPITAL_COMMUNITY): Payer: Medicaid Other

## 2021-11-17 ENCOUNTER — Encounter (HOSPITAL_COMMUNITY): Payer: Self-pay

## 2021-11-17 DIAGNOSIS — R7401 Elevation of levels of liver transaminase levels: Secondary | ICD-10-CM | POA: Diagnosis not present

## 2021-11-17 DIAGNOSIS — R739 Hyperglycemia, unspecified: Secondary | ICD-10-CM | POA: Diagnosis not present

## 2021-11-17 DIAGNOSIS — Z79899 Other long term (current) drug therapy: Secondary | ICD-10-CM | POA: Diagnosis not present

## 2021-11-17 DIAGNOSIS — R1011 Right upper quadrant pain: Secondary | ICD-10-CM | POA: Diagnosis present

## 2021-11-17 DIAGNOSIS — B159 Hepatitis A without hepatic coma: Secondary | ICD-10-CM | POA: Diagnosis not present

## 2021-11-17 LAB — COMPREHENSIVE METABOLIC PANEL
ALT: 55 U/L — ABNORMAL HIGH (ref 0–44)
AST: 37 U/L (ref 15–41)
Albumin: 4.1 g/dL (ref 3.5–5.0)
Alkaline Phosphatase: 77 U/L (ref 38–126)
Anion gap: 9 (ref 5–15)
BUN: 9 mg/dL (ref 6–20)
CO2: 22 mmol/L (ref 22–32)
Calcium: 9.1 mg/dL (ref 8.9–10.3)
Chloride: 103 mmol/L (ref 98–111)
Creatinine, Ser: 0.74 mg/dL (ref 0.44–1.00)
GFR, Estimated: >60 mL/min (ref >60)
Glucose, Bld: 266 mg/dL — ABNORMAL HIGH (ref 70–99)
Potassium: 3.2 mmol/L — ABNORMAL LOW (ref 3.5–5.1)
Sodium: 134 mmol/L — ABNORMAL LOW (ref 135–145)
Total Bilirubin: 0.4 mg/dL (ref 0.3–1.2)
Total Protein: 7.2 g/dL (ref 6.5–8.1)

## 2021-11-17 LAB — URINALYSIS, ROUTINE W REFLEX MICROSCOPIC
Bacteria, UA: NONE SEEN
Bilirubin Urine: NEGATIVE
Glucose, UA: >=500 mg/dL — AB
Ketones, ur: NEGATIVE mg/dL
Leukocytes,Ua: NEGATIVE
Nitrite: NEGATIVE
Protein, ur: NEGATIVE mg/dL
Specific Gravity, Urine: 1.011 (ref 1.005–1.030)
pH: 7 (ref 5.0–8.0)

## 2021-11-17 LAB — CBC
HCT: 34.9 % — ABNORMAL LOW (ref 36.0–46.0)
Hemoglobin: 12.6 g/dL (ref 12.0–15.0)
MCH: 32.9 pg (ref 26.0–34.0)
MCHC: 36.1 g/dL — ABNORMAL HIGH (ref 30.0–36.0)
MCV: 91.1 fL (ref 80.0–100.0)
Platelets: 228 10*3/uL (ref 150–400)
RBC: 3.83 MIL/uL — ABNORMAL LOW (ref 3.87–5.11)
RDW: 11.9 % (ref 11.5–15.5)
WBC: 11 10*3/uL — ABNORMAL HIGH (ref 4.0–10.5)
nRBC: 0 % (ref 0.0–0.2)

## 2021-11-17 LAB — LIPASE, BLOOD: Lipase: 34 U/L (ref 11–51)

## 2021-11-17 MED ORDER — ALUM & MAG HYDROXIDE-SIMETH 200-200-20 MG/5ML PO SUSP
30.0000 mL | Freq: Once | ORAL | Status: AC
  Administered 2021-11-17: 30 mL via ORAL
  Filled 2021-11-17: qty 30

## 2021-11-17 MED ORDER — LACTATED RINGERS IV BOLUS
1000.0000 mL | Freq: Once | INTRAVENOUS | Status: AC
  Administered 2021-11-17: 1000 mL via INTRAVENOUS

## 2021-11-17 MED ORDER — LIDOCAINE VISCOUS HCL 2 % MT SOLN
15.0000 mL | Freq: Once | OROMUCOSAL | Status: AC
  Administered 2021-11-17: 15 mL via ORAL
  Filled 2021-11-17: qty 15

## 2021-11-17 MED ORDER — METOCLOPRAMIDE HCL 5 MG/ML IJ SOLN
10.0000 mg | Freq: Once | INTRAMUSCULAR | Status: AC
  Administered 2021-11-17: 10 mg via INTRAVENOUS
  Filled 2021-11-17: qty 2

## 2021-11-17 MED ORDER — ONDANSETRON HCL 4 MG/2ML IJ SOLN
4.0000 mg | Freq: Once | INTRAMUSCULAR | Status: AC
  Administered 2021-11-17: 4 mg via INTRAVENOUS
  Filled 2021-11-17: qty 2

## 2021-11-17 MED ORDER — IOHEXOL 300 MG/ML  SOLN
100.0000 mL | Freq: Once | INTRAMUSCULAR | Status: AC | PRN
  Administered 2021-11-17: 100 mL via INTRAVENOUS

## 2021-11-17 NOTE — Discharge Instructions (Addendum)
Based on the lab results that resulted from your GI doctor while you are here in the emergency department, it does appear that you are positive for hepatitis A.  For now, we will treat this symptomatically by giving you some IV fluids and making sure that you are given plenty of antinausea medications to manage her symptoms.  I suspect that your GI doctor will call you in the morning with your results and be able to provide you more extensive plan.  Most of the time, hepatitis A resolves on its own.  Just ensure that you are drinking plenty of fluids and managing your symptoms. ?These results correlate with what we saw on your CT scan and with your mildly elevated liver enzyme value.   ? ?Incidentally, we also noted that you had some glucose in your urine and your glucose was about 266. We gave you some fluids which should bring that back down, but please follow up closely with your PCP about this. ? ? ?

## 2021-11-17 NOTE — ED Triage Notes (Signed)
Patient had food poisoning last week. Thrown up 5 times this week and tonight her dinner. This morning she has flank pain on right side. Started this morning dull achey, painful. History of kidney stones.  ?

## 2021-11-18 NOTE — ED Provider Notes (Signed)
[facility]-EMERGENCY DEPT Provider Note   CSN: [id number] Arrival date & time: 11/17/21  2111     History  Chief Complaint  Patient presents with   Abdominal Pain   Flank Pain    Katie Spencer is a 41 y.o. female with history of chronic abdominal pain, kidney stones who presents to the ED for evaluation of abdominal pain, nausea and vomiting that has been going on for about 6 days.  Patient states that she feels that she ate something contaminated when she was out to dinner and has had a long period of food poisoning.  She did get seen by her GI doctor this morning who agrees with this possibility.  Patient has been using her prescription of Phenergan with mild improvement of nausea, although she feels that she has to pop it like candy ".  She endorses this epigastric abdominal pain radiating into the right flank.  She does have a history of kidney stones although she does not think that this is similar.  She denies dysuria and hematuria.  She denies chest pain, shortness of breath.   Abdominal Pain Flank Pain Associated symptoms include abdominal pain.      Home Medications Prior to Admission medications   Medication Sig Start Date End Date Taking? Authorizing Provider  butalbital-acetaminophen-caffeine (FIORICET) 50-325-40 MG tablet Take 1 tablet by mouth 2 (two) times daily as needed for headache.    [provider]  citalopram (CELEXA) 40 MG tablet Take 40 mg by mouth daily. 01/01/20   [provider]  dicyclomine (BENTYL) 20 MG tablet Take 1 tablet (20 mg total) by mouth every 8 (eight) hours as needed for spasms. 12/03/20   Petrucelli, Samantha R, PA-C  hydrOXYzine (ATARAX/VISTARIL) 25 MG tablet Take 25 mg by mouth 3 (three) times daily as needed for anxiety.  04/30/20   [provider]  lidocaine (LIDODERM) 5 % Place 1 patch onto the skin daily. Remove & Discard patch within 12 hours or as directed by MD 07/28/21   Redwine,  Madison A, PA-C  lidocaine (XYLOCAINE) 2 % solution Use as directed 15 mLs in the mouth or throat every 6 (six) hours as needed for mouth pain. 03/03/21   Pricilla Loveless, MD  MOBIC 15 MG tablet Take 15 mg by mouth daily as needed for pain. 10/05/20   [provider]  ondansetron (ZOFRAN ODT) 4 MG disintegrating tablet Take 1 tablet (4 mg total) by mouth every 8 (eight) hours as needed for nausea or vomiting. 12/03/20   Petrucelli, Samantha R, PA-C  pantoprazole (PROTONIX) 40 MG tablet Take 1 tablet (40 mg total) by mouth 2 (two) times daily. 10/24/20 11/23/20  Dartha Lodge, PA-C  predniSONE (DELTASONE) 10 MG tablet Take 2 tablets (20 mg total) by mouth daily. 09/21/21   Theron Arista, PA-C  sucralfate (CARAFATE) 1 GM/10ML suspension Take 10 mLs (1 g total) by mouth 4 (four) times daily -  with meals and at bedtime. 03/03/21   Pricilla Loveless, MD  tamsulosin (FLOMAX) 0.4 MG CAPS capsule Take 1 capsule (0.4 mg total) by mouth daily. 10/24/20   Dartha Lodge, PA-C  VRAYLAR 6 MG CAPS Take 6 mg by mouth at bedtime.  01/02/20   [provider]  belladonna-opium (B&O SUPPRETTES) 16.2-30 MG suppository Place 1 suppository rectally every 8 (eight) hours as needed for pain. Patient not taking: Reported on 05/27/2020 05/20/20 08/18/20  Gilda Crease, MD  lansoprazole (PREVACID) 30 MG capsule Take 1 capsule daily while  taking ibuprofen. Patient not taking: Reported on 05/27/2020 04/16/20 08/18/20  Molpus, Jonny RuizJohn, MD      Allergies    Morphine and related, Almond (diagnostic), and Nsaids    Review of Systems   Review of Systems  Gastrointestinal:  Positive for abdominal pain.  Genitourinary:  Positive for flank pain.   Physical Exam Updated Vital Signs BP (!) 147/95 (BP Location: Left Arm)    Pulse (!) 101    Temp 98.3 F (36.8 C) (Oral)    Resp 20    Ht 5\' 4"  (1.626 m)    Wt 104.3 kg    LMP 04/04/2020 Comment: neg preg test   SpO2 100%    BMI 39.48 kg/m  Physical Exam Vitals and nursing note  reviewed.  Constitutional:      General: She is not in acute distress.    Appearance: She is not ill-appearing.  HENT:     Head: Atraumatic.  Eyes:     Conjunctiva/sclera: Conjunctivae normal.  Cardiovascular:     Rate and Rhythm: Normal rate and regular rhythm.     Pulses: Normal pulses.     Heart sounds: No murmur heard. Pulmonary:     Effort: Pulmonary effort is normal. No respiratory distress.     Breath sounds: Normal breath sounds.  Abdominal:     General: Abdomen is flat. There is distension.     Palpations: Abdomen is soft.     Tenderness: There is abdominal tenderness.     Comments: Patient is mildly distended epigastrically and her to palpation in epigastric right upper quadrant/right flank.,  Negative CVA tenderness  Musculoskeletal:        General: Normal range of motion.     Cervical back: Normal range of motion.  Skin:    General: Skin is warm and dry.     Capillary Refill: Capillary refill takes less than 2 seconds.  Neurological:     General: No focal deficit present.     Mental Status: She is alert.  Psychiatric:        Mood and Affect: Mood normal.    ED Results / Procedures / Treatments   Labs (all labs ordered are listed, but only abnormal results are displayed) Labs Reviewed  COMPREHENSIVE METABOLIC PANEL - Abnormal; Notable for the following components:      Result Value   Sodium 134 (*)    Potassium 3.2 (*)    Glucose, Bld 266 (*)    ALT 55 (*)    All other components within normal limits  CBC - Abnormal; Notable for the following components:   WBC 11.0 (*)    RBC 3.83 (*)    HCT 34.9 (*)    MCHC 36.1 (*)    All other components within normal limits  URINALYSIS, ROUTINE W REFLEX MICROSCOPIC - Abnormal; Notable for the following components:   APPearance HAZY (*)    Glucose, UA >=500 (*)    Hgb urine dipstick SMALL (*)    All other components within normal limits  LIPASE, BLOOD    EKG None  Radiology CT ABDOMEN PELVIS W  CONTRAST  Result Date: 11/17/2021 CLINICAL DATA:  Food poisoning last week, emesis, right flank pain EXAM: CT ABDOMEN AND PELVIS WITH CONTRAST TECHNIQUE: Multidetector CT imaging of the abdomen and pelvis was performed using the standard protocol following bolus administration of intravenous contrast. RADIATION DOSE REDUCTION: This exam was performed according to the departmental dose-optimization program which includes automated exposure control, adjustment of the mA and/or kV  according to patient size and/or use of iterative reconstruction technique. CONTRAST:  OMNIPAQUE IOHEXOL 300 MG/ML  SOLN COMPARISON:  08/14/2021 FINDINGS: Lower chest: No acute pleural or parenchymal lung disease. Hepatobiliary: Diffuse hepatic steatosis. No focal liver abnormality. Cholecystectomy. Pancreas: Unremarkable. No pancreatic ductal dilatation or surrounding inflammatory changes. Spleen: Normal in size without focal abnormality. Adrenals/Urinary Tract: Adrenal glands are unremarkable. Kidneys are normal, without renal calculi, focal lesion, or hydronephrosis. Bladder is unremarkable. Stomach/Bowel: No bowel obstruction or ileus. Normal appendix right lower quadrant. Scattered colonic diverticulosis without evidence of diverticulitis. No bowel wall thickening or inflammatory change. Vascular/Lymphatic: No significant vascular findings are present. No enlarged abdominal or pelvic lymph nodes. Reproductive: Status post hysterectomy. No adnexal masses. Other: No free fluid or free gas. Musculoskeletal: No acute or destructive bony lesions. Reconstructed images demonstrate no additional findings. IMPRESSION: 1. Colonic diverticulosis without diverticulitis. 2. Hepatic steatosis. Electronically Signed   By: Sharlet Salina M.D.   On: 11/17/2021 23:22    Procedures Procedures    Medications Ordered in ED Medications  alum & mag hydroxide-simeth (MAALOX/MYLANTA) 200-200-20 MG/5ML suspension 30 mL (30 mLs Oral Given 11/17/21  2238)    And  lidocaine (XYLOCAINE) 2 % viscous mouth solution 15 mL (15 mLs Oral Given 11/17/21 2238)  ondansetron (ZOFRAN) injection 4 mg (4 mg Intravenous Given 11/17/21 2247)  iohexol (OMNIPAQUE) 300 MG/ML solution 100 mL (100 mLs Intravenous Contrast Given 11/17/21 2308)  lactated ringers bolus 1,000 mL (1,000 mLs Intravenous New Bag/Given 11/17/21 2320)  metoCLOPramide (REGLAN) injection 10 mg (10 mg Intravenous Given 11/17/21 2347)    ED Course/ Medical Decision Making/ A&P                           Medical Decision Making Amount and/or Complexity of Data Reviewed Labs: ordered. Radiology: ordered.  Risk OTC drugs. Prescription drug management.   History:  Per HPI Social determinants of health: None  Initial impression:  This patient presents to the ED for concern of abdominal pain, this involves an extensive number of treatment options, and is a complaint that carries with it a high risk of complications and morbidity.   The differential diagnosis for generalized abdominal pain includes, but is not limited to AAA, gastroenteritis, appendicitis, Bowel obstruction, Bowel perforation. Gastroparesis, DKA, Hernia, Inflammatory bowel disease, mesenteric ischemia, pancreatitis, peritonitis SBP, volvulus. This is a 41 year old female who appears acutely ill/uncomfortable.  Vitals are normal, nontoxic-appearing.  She is clutching her arms to her upper stomach into her right side as if in support.  She notes that she had numerous labs drawn at her GI office this morning that have not resulted.  She just feels that she cannot keep food down.  Will obtain labs, administer GI cocktail and CT abdomen.   Lab Tests and EKG:  I Ordered, reviewed, and interpreted labs and EKG.  The pertinent results include:  CBC with leukocytosis to 11 Lipase normal CMP with hyperglycemia of 266, mildly elevated ALT at 55 Urinalysis without evidence of infection although glucosuria was noted  Of note, patient  had some of her results from her GI doctor result while she was here in the ED.  These are not available on epic, however I able to view the results on her cell phone.  Patient had positive IgM/IgG antibody, and the results did not differentiate between whether she was positive for solely IgM or IgG making it difficult to determine whether this was an acute or chronic presentation  of hepatitis A.  Imaging Studies ordered:  I ordered imaging studies including  CT abdomen with mild hepatic steatosis  I independently visualized and interpreted imaging and I agree with the radiologist interpretation.    Cardiac Monitoring:  The patient was maintained on a cardiac monitor.  I personally viewed and interpreted the cardiac monitored which showed an underlying rhythm of: NSR   Medicines ordered and prescription drug management:  I ordered medication including: GI cocktail Morphine 4 mg IV Zofran 4 mg IV Reglan 10 mg IV Reevaluation of the patient after these medicines showed that the patient improved I have reviewed the patients home medicines and have made adjustments as needed    ED Course: Patient had some symptomatic relief with GI cocktail and IV Zofran although she continued to have some epigastric tenderness.  She was given 4 mg morphine to alleviate her pain while pending CT abdomen.  Incidentally, we did note that her glucose was at 266.  Patient states she has no history of diabetes but has been told in the past that she is prediabetic.  Anion gap is normal.  1 L bolus of normal saline was administered both for her vomiting and for her hyperglycemia.  She did have mildly elevated ALT with hepatic steatosis seen on exam.  Lab results from her GI that resulted while the emergency department indicate that patient has an active hepatitis A infection.  This does explain most of her symptoms.  Disposition:  After consideration of the diagnostic results, physical exam, history and the  patients response to treatment feel that the patent would benefit from discharge with strict return precautions and outpatient follow-up.   Hepatitis A: Patient has positive hepatitis A antibodies that I personally visualized and interpreted.  This correlates with her mildly elevated liver enzymes and CT imaging as well as her current symptoms.  We will treat this symptomatically with plenty of fluids, antiemetics.  Her GI doctor will likely call her in the morning to discuss results personally.  Return precautions were discussed.  All questions were asked and answered.  Patient was discharged home in good condition. Hyperglycemia: Advised patient to follow-up with her PCP regarding her hyperglycemia to ensure that she has not progressed from prediabetic to diabetic.  Return precautions for this were discussed.   Final Clinical Impression(s) / ED Diagnoses Final diagnoses:  Viral hepatitis A without hepatic coma  Hyperglycemia    Rx / DC Orders ED Discharge Orders     None         Janell Quiet, PA-C 11/18/21 0025    Charlynne Pander, MD 11/19/21 [phone]

## 2021-11-21 ENCOUNTER — Emergency Department (HOSPITAL_BASED_OUTPATIENT_CLINIC_OR_DEPARTMENT_OTHER)
Admission: EM | Admit: 2021-11-21 | Discharge: 2021-11-22 | Disposition: A | Discharge status: Home / Self Care | Payer: Medicaid Other | Attending: Emergency Medicine | Admitting: Emergency Medicine

## 2021-11-21 ENCOUNTER — Other Ambulatory Visit: Payer: Self-pay

## 2021-11-21 ENCOUNTER — Encounter (HOSPITAL_BASED_OUTPATIENT_CLINIC_OR_DEPARTMENT_OTHER): Payer: Self-pay | Admitting: *Deleted

## 2021-11-21 DIAGNOSIS — D72829 Elevated white blood cell count, unspecified: Secondary | ICD-10-CM | POA: Insufficient documentation

## 2021-11-21 DIAGNOSIS — R779 Abnormality of plasma protein, unspecified: Secondary | ICD-10-CM | POA: Insufficient documentation

## 2021-11-21 DIAGNOSIS — R112 Nausea with vomiting, unspecified: Secondary | ICD-10-CM | POA: Diagnosis not present

## 2021-11-21 DIAGNOSIS — E876 Hypokalemia: Secondary | ICD-10-CM | POA: Insufficient documentation

## 2021-11-21 DIAGNOSIS — Z20822 Contact with and (suspected) exposure to covid-19: Secondary | ICD-10-CM | POA: Insufficient documentation

## 2021-11-21 DIAGNOSIS — R718 Other abnormality of red blood cells: Secondary | ICD-10-CM | POA: Insufficient documentation

## 2021-11-21 DIAGNOSIS — R739 Hyperglycemia, unspecified: Secondary | ICD-10-CM | POA: Diagnosis not present

## 2021-11-21 DIAGNOSIS — Z79899 Other long term (current) drug therapy: Secondary | ICD-10-CM | POA: Diagnosis not present

## 2021-11-21 DIAGNOSIS — R1012 Left upper quadrant pain: Secondary | ICD-10-CM | POA: Diagnosis not present

## 2021-11-21 DIAGNOSIS — R197 Diarrhea, unspecified: Secondary | ICD-10-CM | POA: Insufficient documentation

## 2021-11-21 DIAGNOSIS — R1013 Epigastric pain: Secondary | ICD-10-CM | POA: Diagnosis not present

## 2021-11-21 LAB — LIPASE, BLOOD: Lipase: 30 U/L (ref 11–51)

## 2021-11-21 LAB — CBC
HCT: 36.4 % (ref 36.0–46.0)
Hemoglobin: 13.2 g/dL (ref 12.0–15.0)
MCH: 32.7 pg (ref 26.0–34.0)
MCHC: 36.3 g/dL — ABNORMAL HIGH (ref 30.0–36.0)
MCV: 90.1 fL (ref 80.0–100.0)
Platelets: 202 10*3/uL (ref 150–400)
RBC: 4.04 MIL/uL (ref 3.87–5.11)
RDW: 11.8 % (ref 11.5–15.5)
WBC: 10.9 10*3/uL — ABNORMAL HIGH (ref 4.0–10.5)
nRBC: 0 % (ref 0.0–0.2)

## 2021-11-21 LAB — COMPREHENSIVE METABOLIC PANEL
ALT: 43 U/L (ref 0–44)
AST: 32 U/L (ref 15–41)
Albumin: 3.5 g/dL (ref 3.5–5.0)
Alkaline Phosphatase: 70 U/L (ref 38–126)
Anion gap: 9 (ref 5–15)
BUN: 10 mg/dL (ref 6–20)
CO2: 23 mmol/L (ref 22–32)
Calcium: 8.6 mg/dL — ABNORMAL LOW (ref 8.9–10.3)
Chloride: 103 mmol/L (ref 98–111)
Creatinine, Ser: 0.67 mg/dL (ref 0.44–1.00)
GFR, Estimated: >60 mL/min (ref >60)
Glucose, Bld: 165 mg/dL — ABNORMAL HIGH (ref 70–99)
Potassium: 3.4 mmol/L — ABNORMAL LOW (ref 3.5–5.1)
Sodium: 135 mmol/L (ref 135–145)
Total Bilirubin: 0.6 mg/dL (ref 0.3–1.2)
Total Protein: 6.4 g/dL — ABNORMAL LOW (ref 6.5–8.1)

## 2021-11-21 LAB — URINALYSIS, ROUTINE W REFLEX MICROSCOPIC
Bilirubin Urine: NEGATIVE
Glucose, UA: NEGATIVE mg/dL
Hgb urine dipstick: NEGATIVE
Ketones, ur: NEGATIVE mg/dL
Leukocytes,Ua: NEGATIVE
Nitrite: NEGATIVE
Protein, ur: NEGATIVE mg/dL
Specific Gravity, Urine: 1.02 (ref 1.005–1.030)
pH: 7.5 (ref 5.0–8.0)

## 2021-11-21 MED ORDER — SODIUM CHLORIDE 0.9 % IV BOLUS
1000.0000 mL | Freq: Once | INTRAVENOUS | Status: AC
  Administered 2021-11-21: 1000 mL via INTRAVENOUS

## 2021-11-21 MED ORDER — METOCLOPRAMIDE HCL 5 MG/ML IJ SOLN
10.0000 mg | Freq: Once | INTRAMUSCULAR | Status: AC
  Administered 2021-11-21: 10 mg via INTRAVENOUS
  Filled 2021-11-21: qty 2

## 2021-11-21 NOTE — ED Notes (Signed)
Patient able to tolerate fluids with no distress. ?

## 2021-11-21 NOTE — ED Provider Notes (Signed)
[facility] EMERGENCY DEPARTMENT Provider Note   CSN: [id number] Arrival date & time: 11/21/21  2016     History  Chief Complaint  Patient presents with   Abdominal Pain   Emesis   Diarrhea    Katie Spencer is a 41 y.o. female.  HPI Patient is a 41 year old female who presents to the emergency department due to intractable nausea and vomiting.  Patient states that her symptoms started initially about 10 days ago after eating [place] food.  She was evaluated by her gastroenterologist and diagnosed with food poisoning and discharged with antiemetics.  States that her symptoms persisted and she ultimately went to the emergency department on March 2.  She had a CT scan of the abdomen/pelvis at the time which showed colonic diverticulosis without diverticulitis as well as hepatic steatosis.  She was discharged in stable condition and represented yesterday due to abdominal pain as well as nausea and vomiting.  She was evaluated once again and discharged in stable condition.  She was tested for hepatitis at her recent GI visit.  She was positive for hepatitis A however the IgM antibody was nonreactive.  Unsure of the acuity of her infection as well.  LFTs were within normal limits at her most recent emergency department visit.  She states since being discharged she has continued to take Zofran without significant relief.  She was also prescribed p.o. Phenergan but believes that she is not able to absorb this medication and is experiencing persistent nausea, vomiting, and diarrhea.  Denies any hematochezia but does note that her most recent episode of vomiting just prior to arrival she saw a small amount of blood.  No chest pain or shortness of breath but she does note upper abdominal pain that has been persistent since her symptoms began 10 days ago.  Denies any urinary complaints.  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    Home Medications Prior to  Admission medications   Medication Sig Start Date End Date Taking? Authorizing Provider  metoCLOPramide (REGLAN) 10 MG tablet Take 1 tablet (10 mg total) by mouth 2 (two) times daily as needed for nausea. 11/22/21  Yes Placido Sou, PA-C  promethazine (PHENERGAN) 25 MG suppository Place 1 suppository (25 mg total) rectally every 8 (eight) hours as needed for nausea or vomiting. 11/22/21  Yes Placido Sou, PA-C  butalbital-acetaminophen-caffeine (FIORICET) 50-325-40 MG tablet Take 1 tablet by mouth 2 (two) times daily as needed for headache.    [provider]  citalopram (CELEXA) 40 MG tablet Take 40 mg by mouth daily. 01/01/20   [provider]  dicyclomine (BENTYL) 20 MG tablet Take 1 tablet (20 mg total) by mouth every 8 (eight) hours as needed for spasms. 12/03/20   Petrucelli, Samantha R, PA-C  hydrOXYzine (ATARAX/VISTARIL) 25 MG tablet Take 25 mg by mouth 3 (three) times daily as needed for anxiety.  04/30/20   [provider]  lidocaine (LIDODERM) 5 % Place 1 patch onto the skin daily. Remove & Discard patch within 12 hours or as directed by MD 07/28/21   Redwine, Madison A, PA-C  lidocaine (XYLOCAINE) 2 % solution Use as directed 15 mLs in the mouth or throat every 6 (six) hours as needed for mouth pain. 03/03/21   Pricilla Loveless, MD  MOBIC 15 MG tablet Take 15 mg by mouth daily as needed for pain. 10/05/20   [provider]  ondansetron (ZOFRAN ODT) 4 MG disintegrating tablet Take 1  tablet (4 mg total) by mouth every 8 (eight) hours as needed for nausea or vomiting. 12/03/20   Petrucelli, Samantha R, PA-C  pantoprazole (PROTONIX) 40 MG tablet Take 1 tablet (40 mg total) by mouth 2 (two) times daily. 10/24/20 11/23/20  Dartha LodgeFord, Kelsey N, PA-C  predniSONE (DELTASONE) 10 MG tablet Take 2 tablets (20 mg total) by mouth daily. 09/21/21   Theron AristaSage, Haley, PA-C  sucralfate (CARAFATE) 1 GM/10ML suspension Take 10 mLs (1 g total) by mouth 4 (four) times daily -  with meals and at  bedtime. 03/03/21   Pricilla LovelessGoldston, Scott, MD  tamsulosin (FLOMAX) 0.4 MG CAPS capsule Take 1 capsule (0.4 mg total) by mouth daily. 10/24/20   Dartha LodgeFord, Kelsey N, PA-C  VRAYLAR 6 MG CAPS Take 6 mg by mouth at bedtime.  01/02/20   [provider]  belladonna-opium (B&O SUPPRETTES) 16.2-30 MG suppository Place 1 suppository rectally every 8 (eight) hours as needed for pain. Patient not taking: Reported on 05/27/2020 05/20/20 08/18/20  Gilda CreasePollina, Christopher J, MD  lansoprazole (PREVACID) 30 MG capsule Take 1 capsule daily while taking ibuprofen. Patient not taking: Reported on 05/27/2020 04/16/20 08/18/20  Molpus, Jonny RuizJohn, MD      Allergies    Morphine and related, Almond (diagnostic), and Nsaids    Review of Systems   Review of Systems  All other systems reviewed and are negative. Ten systems reviewed and are negative for acute change, except as noted in the HPI.   Physical Exam Updated Vital Signs BP (!) 145/91 (BP Location: Right Arm)    Pulse 90    Temp 98.4 F (36.9 C) (Oral)    Resp 17    Ht 5\' 4"  (1.626 m)    Wt 104.3 kg    LMP 04/04/2020 Comment: neg preg test   SpO2 98%    BMI 39.47 kg/m  Physical Exam Vitals and nursing note reviewed.  Constitutional:      General: She is not in acute distress.    Appearance: Normal appearance. She is not ill-appearing, toxic-appearing or diaphoretic.  HENT:     Head: Normocephalic and atraumatic.     Right Ear: External ear normal.     Left Ear: External ear normal.     Nose: Nose normal.     Mouth/Throat:     Mouth: Mucous membranes are moist.     Pharynx: Oropharynx is clear. No oropharyngeal exudate or posterior oropharyngeal erythema.  Eyes:     Extraocular Movements: Extraocular movements intact.  Cardiovascular:     Rate and Rhythm: Normal rate and regular rhythm.     Pulses: Normal pulses.     Heart sounds: Normal heart sounds. No murmur heard.   No friction rub. No gallop.  Pulmonary:     Effort: Pulmonary effort is normal. No respiratory  distress.     Breath sounds: Normal breath sounds. No stridor. No wheezing, rhonchi or rales.  Abdominal:     General: Abdomen is protuberant.     Palpations: Abdomen is soft.     Tenderness: There is abdominal tenderness in the epigastric area and left upper quadrant.     Comments: Protuberant abdomen that is soft.  Mild to moderate tenderness noted along the epigastrium and left upper quadrant.  Musculoskeletal:        General: Normal range of motion.     Cervical back: Normal range of motion and neck supple. No tenderness.  Skin:    General: Skin is warm and dry.  Neurological:  General: No focal deficit present.     Mental Status: She is alert and oriented to person, place, and time.  Psychiatric:        Mood and Affect: Mood normal.        Behavior: Behavior normal.   ED Results / Procedures / Treatments   Labs (all labs ordered are listed, but only abnormal results are displayed) Labs Reviewed  CBC - Abnormal; Notable for the following components:      Result Value   WBC 10.9 (*)    MCHC 36.3 (*)    All other components within normal limits  COMPREHENSIVE METABOLIC PANEL - Abnormal; Notable for the following components:   Potassium 3.4 (*)    Glucose, Bld 165 (*)    Calcium 8.6 (*)    Total Protein 6.4 (*)    All other components within normal limits  URINALYSIS, ROUTINE W REFLEX MICROSCOPIC  LIPASE, BLOOD    EKG None  Radiology No results found.  Procedures Procedures   Medications Ordered in ED Medications  sodium chloride 0.9 % bolus 1,000 mL ( Intravenous Stopped 11/21/21 2314)  metoCLOPramide (REGLAN) injection 10 mg (10 mg Intravenous Given 11/21/21 2212)    ED Course/ Medical Decision Making/ A&P                           Medical Decision Making Amount and/or Complexity of Data Reviewed Labs: ordered.  Risk Prescription drug management.  Pt is a 41 y.o. female who presents to the emergency department for reevaluation of upper abdominal pain  as well as nausea/vomiting.  Labs: CBC with a white count of 10.9 and an MCHC of 36.3. CMP with a potassium of 3.4, glucose of 165, calcium of 8.6, total protein is 6.4. Lipase of 30. UA is within normal limits.  I, Placido Sou, PA-C, personally reviewed and evaluated these images and lab results as part of my medical decision-making.  Unsure the source of the patient's symptoms.  Possibly reflux.  Initially thought to be food poisoning but her symptoms have been persistent for the past 10 days.  This is her third visit in the emergency department and she also had a reassuring CT scan of her abdomen/pelvis 4 days ago.  On my initial exam patient endorsed continued nausea and vomiting.  Also has mild to moderate tenderness along the epigastrium and left upper quadrant.  She had been taking p.o. antiemetics but believes that she was unable to hold them down and thus her symptoms were not improving.  Patient's symptoms were treated today with Reglan as well as 1 L of normal saline.  On reassessment her pain is nearly resolved.  Denies any current nausea and is drinking fluids without difficulty.  She does have a mild leukocytosis of 10.9 but this appears stable when compared to priors.  Currently afebrile and nontachycardic.  Nontoxic-appearing.  Doubt infectious etiology at this time.  Did not feel that a repeat CT scan was warranted and she was agreeable.    Patient appears stable for discharge at this time.  She is agreeable.  Will discharge on a course of p.o. Reglan to use as needed for nausea/vomiting.  If she is unable to tolerate p.o. medication she was also given Phenergan suppositories.  Recommended GI follow-up.  Discussed return precautions.  Her questions were answered and she was amicable at the time of discharge.  Note: Portions of this report may have been transcribed using voice recognition  software. Every effort was made to ensure accuracy; however, inadvertent computerized  transcription errors may be present.   Final Clinical Impression(s) / ED Diagnoses Final diagnoses:  Non-intractable vomiting with nausea  Epigastric pain  Left upper quadrant pain   Rx / DC Orders ED Discharge Orders          Ordered    promethazine (PHENERGAN) 25 MG suppository  Every 8 hours PRN        11/22/21 0007    metoCLOPramide (REGLAN) 10 MG tablet  2 times daily PRN        11/22/21 0007              Placido Sou, PA-C 11/22/21 1525    Horton, Clabe Seal, DO 11/22/21 1621

## 2021-11-21 NOTE — ED Triage Notes (Signed)
Abdominal pain, vomiting and diarrhea. States she has Zofran and Phenergan with no relief. Last week she had "food poisoning". ?

## 2021-11-22 ENCOUNTER — Encounter (HOSPITAL_BASED_OUTPATIENT_CLINIC_OR_DEPARTMENT_OTHER): Payer: Self-pay | Admitting: Urology

## 2021-11-22 ENCOUNTER — Other Ambulatory Visit: Payer: Self-pay

## 2021-11-22 ENCOUNTER — Emergency Department (HOSPITAL_BASED_OUTPATIENT_CLINIC_OR_DEPARTMENT_OTHER)
Admission: EM | Admit: 2021-11-22 | Discharge: 2021-11-22 | Disposition: A | Discharge status: Home / Self Care | Payer: Medicaid Other | Source: Home / Self Care | Attending: Emergency Medicine | Admitting: Emergency Medicine

## 2021-11-22 ENCOUNTER — Emergency Department (HOSPITAL_BASED_OUTPATIENT_CLINIC_OR_DEPARTMENT_OTHER): Payer: Medicaid Other

## 2021-11-22 DIAGNOSIS — R112 Nausea with vomiting, unspecified: Secondary | ICD-10-CM

## 2021-11-22 DIAGNOSIS — R109 Unspecified abdominal pain: Secondary | ICD-10-CM | POA: Insufficient documentation

## 2021-11-22 DIAGNOSIS — Z20822 Contact with and (suspected) exposure to covid-19: Secondary | ICD-10-CM | POA: Insufficient documentation

## 2021-11-22 DIAGNOSIS — Z79899 Other long term (current) drug therapy: Secondary | ICD-10-CM | POA: Insufficient documentation

## 2021-11-22 DIAGNOSIS — R197 Diarrhea, unspecified: Secondary | ICD-10-CM | POA: Insufficient documentation

## 2021-11-22 LAB — CBC WITH DIFFERENTIAL/PLATELET
Abs Immature Granulocytes: 0.06 10*3/uL (ref 0.00–0.07)
Basophils Absolute: 0.1 10*3/uL (ref 0.0–0.1)
Basophils Relative: 1 %
Eosinophils Absolute: 0.2 10*3/uL (ref 0.0–0.5)
Eosinophils Relative: 2 %
HCT: 33.1 % — ABNORMAL LOW (ref 36.0–46.0)
Hemoglobin: 11.9 g/dL — ABNORMAL LOW (ref 12.0–15.0)
Immature Granulocytes: 1 %
Lymphocytes Relative: 34 %
Lymphs Abs: 3.6 10*3/uL (ref 0.7–4.0)
MCH: 32.8 pg (ref 26.0–34.0)
MCHC: 36 g/dL (ref 30.0–36.0)
MCV: 91.2 fL (ref 80.0–100.0)
Monocytes Absolute: 0.7 10*3/uL (ref 0.1–1.0)
Monocytes Relative: 6 %
Neutro Abs: 6.1 10*3/uL (ref 1.7–7.7)
Neutrophils Relative %: 56 %
Platelets: 219 10*3/uL (ref 150–400)
RBC: 3.63 MIL/uL — ABNORMAL LOW (ref 3.87–5.11)
RDW: 11.7 % (ref 11.5–15.5)
WBC: 10.6 10*3/uL — ABNORMAL HIGH (ref 4.0–10.5)
nRBC: 0 % (ref 0.0–0.2)

## 2021-11-22 LAB — COMPREHENSIVE METABOLIC PANEL
ALT: 48 U/L — ABNORMAL HIGH (ref 0–44)
AST: 34 U/L (ref 15–41)
Albumin: 3.7 g/dL (ref 3.5–5.0)
Alkaline Phosphatase: 75 U/L (ref 38–126)
Anion gap: 8 (ref 5–15)
BUN: 8 mg/dL (ref 6–20)
CO2: 23 mmol/L (ref 22–32)
Calcium: 8.6 mg/dL — ABNORMAL LOW (ref 8.9–10.3)
Chloride: 105 mmol/L (ref 98–111)
Creatinine, Ser: 0.66 mg/dL (ref 0.44–1.00)
GFR, Estimated: >60 mL/min (ref >60)
Glucose, Bld: 96 mg/dL (ref 70–99)
Potassium: 3.8 mmol/L (ref 3.5–5.1)
Sodium: 136 mmol/L (ref 135–145)
Total Bilirubin: 0.5 mg/dL (ref 0.3–1.2)
Total Protein: 6.7 g/dL (ref 6.5–8.1)

## 2021-11-22 LAB — RAPID URINE DRUG SCREEN, HOSP PERFORMED
Amphetamines: NOT DETECTED
Barbiturates: NOT DETECTED
Benzodiazepines: NOT DETECTED
Cocaine: NOT DETECTED
Opiates: NOT DETECTED
Tetrahydrocannabinol: NOT DETECTED

## 2021-11-22 LAB — RESP PANEL BY RT-PCR (FLU A&B, COVID) ARPGX2
Influenza A by PCR: NEGATIVE
Influenza B by PCR: NEGATIVE
SARS Coronavirus 2 by RT PCR: NEGATIVE

## 2021-11-22 MED ORDER — LIDOCAINE VISCOUS HCL 2 % MT SOLN: 15.0000 mL | Freq: Once | OROMUCOSAL | Status: DC

## 2021-11-22 MED ORDER — ALUM & MAG HYDROXIDE-SIMETH 200-200-20 MG/5ML PO SUSP: 30.0000 mL | Freq: Once | ORAL | Status: DC

## 2021-11-22 MED ORDER — LOPERAMIDE HCL 2 MG PO CAPS: 2.0000 mg | ORAL_CAPSULE | Freq: Four times a day (QID) | ORAL | 0 refills | Status: DC | PRN

## 2021-11-22 MED ORDER — SODIUM CHLORIDE 0.9 % IV BOLUS
1000.0000 mL | Freq: Once | INTRAVENOUS | Status: AC
  Administered 2021-11-22: 1000 mL via INTRAVENOUS

## 2021-11-22 MED ORDER — IOHEXOL 300 MG/ML  SOLN
125.0000 mL | Freq: Once | INTRAMUSCULAR | Status: AC | PRN
  Administered 2021-11-22: 125 mL via INTRAVENOUS

## 2021-11-22 MED ORDER — METOCLOPRAMIDE HCL 5 MG/ML IJ SOLN
10.0000 mg | Freq: Once | INTRAMUSCULAR | Status: DC
  Filled 2021-11-22: qty 2

## 2021-11-22 MED ORDER — METOCLOPRAMIDE HCL 5 MG/ML IJ SOLN
10.0000 mg | Freq: Once | INTRAMUSCULAR | Status: AC
Start: 2021-11-22 — End: 2021-11-22
  Administered 2021-11-22: 10 mg via INTRAVENOUS
  Filled 2021-11-22: qty 2

## 2021-11-22 MED ORDER — HALOPERIDOL LACTATE 5 MG/ML IJ SOLN: 5.0000 mg | Freq: Once | INTRAMUSCULAR | Status: DC

## 2021-11-22 MED ORDER — HALOPERIDOL LACTATE 5 MG/ML IJ SOLN
2.0000 mg | Freq: Once | INTRAMUSCULAR | Status: AC
  Administered 2021-11-22: 2 mg via INTRAMUSCULAR
  Filled 2021-11-22: qty 1

## 2021-11-22 MED ORDER — PROMETHAZINE HCL 25 MG RE SUPP: 25.0000 mg | Freq: Three times a day (TID) | RECTAL | 0 refills | Status: DC | PRN

## 2021-11-22 MED ORDER — METOCLOPRAMIDE HCL 10 MG PO TABS: 10.0000 mg | ORAL_TABLET | Freq: Two times a day (BID) | ORAL | 0 refills | Status: DC | PRN

## 2021-11-22 MED ORDER — PANTOPRAZOLE SODIUM 40 MG PO TBEC: 40.0000 mg | DELAYED_RELEASE_TABLET | Freq: Two times a day (BID) | ORAL | 0 refills | Status: DC

## 2021-11-22 MED ORDER — METOCLOPRAMIDE HCL 5 MG/ML IJ SOLN
10.0000 mg | Freq: Once | INTRAMUSCULAR | Status: AC
  Administered 2021-11-22: 10 mg via INTRAMUSCULAR

## 2021-11-22 NOTE — ED Provider Notes (Signed)
?[facility] EMERGENCY DEPARTMENT ?Provider Note ? ? ?CSN: [id number] ?Arrival date & time: 11/22/21  1723 ? ?  ? ?History ? ?Chief Complaint  ?Patient presents with  ? Emesis  ? ? ?Elmyra Ferrelli is a 41 y.o. female with a past medical history of recurrent abdominal pain, nausea and vomiting presenting today with the same.  Patient was seen yesterday with a negative work-up.  She was prescribed Reglan and Phenergan suppositories.  She reports that she was unable to get her medications in the morning however around 1 PM she took a Reglan and she continued to be nauseous so around 430 she used a Phenergan suppository.  She continued to vomit so decided to come to the emergency department today.  No new or worsening symptoms, is just concerned that she is not getting better.  Has an appointment with her gastroenterologist on 3/21.  They were unable to fit her in sooner.  Of note, her symptoms began nearly 2 weeks ago after eating at a new restaurant.  She was seen by GI who told her that she had a stomach bug.  Today, she states that she has only had chicken noodle soup from [address] and she was unable to hold it down.  Continues to deny changes or concerns in her bowel movements.  Does not drink alcohol or take ibuprofen/naproxen/aspirin regularly ? ? ?Home Medications ?Prior to Admission medications   ?Medication Sig Start Date End Date Taking? Authorizing Provider  ?butalbital-acetaminophen-caffeine (FIORICET) 50-325-40 MG tablet Take 1 tablet by mouth 2 (two) times daily as needed for headache.    [provider]  ?citalopram (CELEXA) 40 MG tablet Take 40 mg by mouth daily. 01/01/20   [provider]  ?dicyclomine (BENTYL) 20 MG tablet Take 1 tablet (20 mg total) by mouth every 8 (eight) hours as needed for spasms. 12/03/20   Petrucelli, Samantha R, PA-C  ?hydrOXYzine (ATARAX/VISTARIL) 25 MG tablet Take 25 mg by mouth 3 (three) times daily as needed for anxiety.  04/30/20   [provider]  ?lidocaine (LIDODERM) 5 % Place 1 patch onto the skin daily. Remove & Discard patch within 12 hours or as directed by MD 07/28/21   Ann-Marie Kluge A, PA-C  ?lidocaine (XYLOCAINE) 2 % solution Use as directed 15 mLs in the mouth or throat every 6 (six) hours as needed for mouth pain. 03/03/21   Sherwood Gambler, MD  ?metoCLOPramide (REGLAN) 10 MG tablet Take 1 tablet (10 mg total) by mouth 2 (two) times daily as needed for nausea. 11/22/21   Rayna Sexton, PA-C  ?MOBIC 15 MG tablet Take 15 mg by mouth daily as needed for pain. 10/05/20   [provider]  ?ondansetron (ZOFRAN ODT) 4 MG disintegrating tablet Take 1 tablet (4 mg total) by mouth every 8 (eight) hours as needed for nausea or vomiting. 12/03/20   Petrucelli, Samantha R, PA-C  ?pantoprazole (PROTONIX) 40 MG tablet Take 1 tablet (40 mg total) by mouth 2 (two) times daily. 10/24/20 11/23/20  Jacqlyn Larsen, PA-C  ?predniSONE (DELTASONE) 10 MG tablet Take 2 tablets (20 mg total) by mouth daily. 09/21/21   Sherrill Raring, PA-C  ?promethazine (PHENERGAN) 25 MG suppository Place 1 suppository (25 mg total) rectally every 8 (eight) hours as needed for nausea or vomiting. 11/22/21   Rayna Sexton, PA-C  ?sucralfate (CARAFATE) 1 GM/10ML suspension Take 10 mLs (1 g total) by mouth 4 (four) times daily -  with meals and at bedtime. 03/03/21   Sherwood Gambler,  MD  ?tamsulosin (FLOMAX) 0.4 MG CAPS capsule Take 1 capsule (0.4 mg total) by mouth daily. 10/24/20   Jacqlyn Larsen, PA-C  ?VRAYLAR 6 MG CAPS Take 6 mg by mouth at bedtime.  01/02/20   [provider]  ?belladonna-opium (B&O SUPPRETTES) 16.2-30 MG suppository Place 1 suppository rectally every 8 (eight) hours as needed for pain. ?Patient not taking: Reported on 05/27/2020 05/20/20 08/18/20  Orpah Greek, MD  ?lansoprazole (PREVACID) 30 MG capsule Take 1 capsule daily while taking ibuprofen. ?Patient not taking: Reported on 05/27/2020 04/16/20 08/18/20  Molpus, Jenny Reichmann, MD  ?   ? ?Allergies     ?Morphine and related, Almond (diagnostic), and Nsaids   ? ?Review of Systems   ?Review of Systems  ?Gastrointestinal:  Positive for vomiting.  ?See HPI ? ?Physical Exam ?Updated Vital Signs ?BP (!) 155/92 (BP Location: Right Arm)   Pulse 93   Temp 98.4 ?F (36.9 ?C) (Oral)   Resp 18   Ht 5\' 4"  (1.626 m)   Wt 104.3 kg   LMP 04/04/2020 Comment: neg preg test  SpO2 99%   BMI 39.47 kg/m?  ?Physical Exam ?Vitals and nursing note reviewed.  ?Constitutional:   ?   Appearance: Normal appearance. She is not ill-appearing.  ?   Comments: Actively retching  ?HENT:  ?   Head: Normocephalic and atraumatic.  ?Eyes:  ?   General: No scleral icterus. ?   Conjunctiva/sclera: Conjunctivae normal.  ?Pulmonary:  ?   Effort: Pulmonary effort is normal. No respiratory distress.  ?Abdominal:  ?   General: There is distension.  ?   Palpations: There is no mass.  ?   Tenderness: There is abdominal tenderness (generalized).  ?   Hernia: No hernia is present.  ?Skin: ?   Findings: No rash.  ?Neurological:  ?   Mental Status: She is alert.  ?Psychiatric:     ?   Mood and Affect: Mood normal.  ? ? ?ED Results / Procedures / Treatments   ?Labs ?(all labs ordered are listed, but only abnormal results are displayed) ?Labs Reviewed - No data to display ? ?EKG ?None ? ?Radiology ?No results found. ? ?Procedures ?Procedures  ? ?Medications Ordered in ED ?Medications  ?haloperidol lactate (HALDOL) injection 2 mg (2 mg Intramuscular Given 11/22/21 1749)  ?metoCLOPramide (REGLAN) injection 10 mg (10 mg Intramuscular Given 11/22/21 1843)  ? ? ?ED Course/ Medical Decision Making/ A&P ?  ?                        ?Medical Decision Making ?Risk ?Prescription drug management. ? ? ?Patient presents to the ED for concern of abdominal pain.  The differential includes but is not limited to gastroenteritis, pancreatitis, appendicitis, gastroparesis, H. pylori, PUD, diverticulitis, pregnancy,  ? ?Per internal/external chart review: This is the fourth visit the  patient has had for GI related symptoms in the past 2 weeks.  She has been prescribed many medications, including Phenergan and Reglan last night.  She has tried these medications once and believes they are not working well enough. ? ? ?Diagnostics: ?Labs, urinalysis and imaging deferred due to patient's recent work-ups.  Had a CT scan 5 days ago that was negative for abnormalities.  Blood work and urinalysis yesterday benign. ? ? ?Test Considered: CTAP however patient is with nonfocal tenderness.  He is also experiencing diarrhea, her presentation is consistent with gastroenteritis/colitis.  Also deferred lab work because it was done 15 hours ago. ? ? ?  Treatment: ? ?I ordered Haldol for patient's today.  She reports that this helped her however she still feels poorly.  IM Reglan ordered due to IV Reglan helping her yesterday. ? ?I sent her Protonix to her pharmacy for any potential gastritis/reflux.  She is to be on this medication however stopped taking it.  We discussed proper instructions for PPI use.  She has also been prescribed Imodium for her diarrhea.  She understands she only needs to take this as needed and after episodes of diarrhea. ? ? ?MDM/Disposition: ? ?At this time, patient continues to vomit.  Her care has been signed out to, PA-C Rayna Sexton, please see his note for further treatment and ultimate disposition ? ?Final Clinical Impression(s) / ED Diagnoses ?Final diagnoses:  ?Nausea vomiting and diarrhea  ? ? ?Rx / DC Orders ?ED Discharge Orders   ? ?      Ordered  ?  loperamide (IMODIUM) 2 MG capsule  4 times daily PRN       ? 11/22/21 1830  ?  pantoprazole (PROTONIX) 40 MG tablet  2 times daily       ? 11/22/21 1830  ? ?  ?  ? ?  ? ? ?  ?Rhae Hammock, PA-C ?11/22/21 1858 ? ?  ?Malvin Johns, MD ?11/22/21 2317 ? ?

## 2021-11-22 NOTE — Discharge Instructions (Addendum)
Please continue to take your prescribed Reglan as well as Phenergan suppositories as needed.  Please follow the instructions on the prescriptions.  Please follow-up with your gastroenterologist first thing tomorrow morning regarding your symptoms as well as this visit tonight.  If you develop any new or worsening symptoms please come back to the emergency department. ?

## 2021-11-22 NOTE — ED Notes (Signed)
Pt sleeping at this time, when aroused states she is still nauseated  ? ?

## 2021-11-22 NOTE — ED Provider Notes (Addendum)
Patient is a 41 year old female whose care was transferred to me at shift change from Redwine PA-C.  Her HPI as below: ? ?Katie Spencer is a 41 y.o. female with a past medical history of recurrent abdominal pain, nausea and vomiting presenting today with the same.  Patient was seen yesterday with a negative work-up.  She was prescribed Reglan and Phenergan suppositories.  She reports that she was unable to get her medications in the morning however around 1 PM she took a Reglan and she continued to be nauseous so around 430 she used a Phenergan suppository.  She continued to vomit so decided to come to the emergency department today.  No new or worsening symptoms, is just concerned that she is not getting better.  Has an appointment with her gastroenterologist on 3/21.  They were unable to fit her in sooner.  Of note, her symptoms began nearly 2 weeks ago after eating at a new restaurant.  She was seen by GI who told her that she had a stomach bug.  Today, she states that she has only had chicken noodle soup from [employer] and she was unable to hold it down.  Continues to deny changes or concerns in her bowel movements.  Does not drink alcohol or take ibuprofen/naproxen/aspirin regularly ?Physical Exam  ?BP 119/63 (BP Location: Right Arm)   Pulse 62   Temp 98.4 ?F (36.9 ?C) (Oral)   Resp 18   Ht 5\' 4"  (1.626 m)   Wt 104.3 kg   LMP 04/04/2020 Comment: neg preg test  SpO2 96%   BMI 39.47 kg/m?  ? ?Physical Exam ?Vitals and nursing note reviewed.  ?Constitutional:   ?   General: She is not in acute distress. ?   Appearance: She is well-developed.  ?HENT:  ?   Head: Normocephalic and atraumatic.  ?   Right Ear: External ear normal.  ?   Left Ear: External ear normal.  ?Eyes:  ?   General: No scleral icterus.    ?   Right eye: No discharge.     ?   Left eye: No discharge.  ?   Conjunctiva/sclera: Conjunctivae normal.  ?Neck:  ?   Trachea: No tracheal deviation.  ?Cardiovascular:  ?   Rate and Rhythm: Normal  rate.  ?Pulmonary:  ?   Effort: Pulmonary effort is normal. No respiratory distress.  ?   Breath sounds: No stridor.  ?Abdominal:  ?   General: Abdomen is flat. There is no distension.  ?   Palpations: Abdomen is soft.  ?   Tenderness: There is abdominal tenderness.  ?   Comments: Protuberant abdomen that is soft.  Mild tenderness noted diffusely across the abdomen.  ?Musculoskeletal:     ?   General: No swelling or deformity.  ?   Cervical back: Neck supple.  ?Skin: ?   General: Skin is warm and dry.  ?   Findings: No rash.  ?Neurological:  ?   Mental Status: She is alert.  ?   Cranial Nerves: Cranial nerve deficit: no gross deficits.  ? ?Procedures  ?Procedures ? ?ED Course / MDM  ?  ?Medical Decision Making ?Amount and/or Complexity of Data Reviewed ?Labs: ordered. ?Radiology: ordered. ? ?Risk ?Prescription drug management. ?Decision regarding hospitalization. ? ?Patient is a 41 year old female whose care was transferred to me at shift change from [place] PA-C.  Please see her note for additional information.  In summary, I initially evaluated the patient last night for intractable nausea and vomiting.  She was given a dose of Reglan as well as 1 L of IV fluids with improvement in her symptoms and she was discharged in stable condition with prescriptions for p.o. Reglan as well as a Phenergan suppository.  She presented today once again due to nausea and vomiting.  This is actually her fourth emergency department visit since her symptoms began 11 days ago.  She had a CT scan showing diverticulosis as well as hepatic steatosis, 5 days ago.  Imaging appeared to otherwise be reassuring.  She states that her symptoms worsened earlier today once again.  Presents to the ED today actively vomiting. ? ?Patient's symptoms were treated by previous provider with IM Haldol as well as Reglan.  Patient reassessed and reports continued symptoms.  Denies any improvement.  She was given an IV dose of Reglan as well as IV fluids  and reassessed once again and was still experiencing nausea and vomiting. ? ?Patient discussed with Dr. Imogene Burn with [facility] hospitalist for possible admission.  Requested that we obtain a new CT scan of the abdomen/pelvis and reconsult based on these results.  This was obtained with findings as noted below: ? ?IMPRESSION:  ?1.  No acute localizing process in the abdomen or pelvis.  ?   ?2.  Stable fatty infiltration of the liver.  ?   ?3. Colonic diverticulosis without evidence for acute diverticulitis.  ? ?CT scan appears reassuring.  Lab work appears similar to her baseline values.  I reevaluated the patient once again and now she notes that her symptoms have improved without further antiemetics.  She was p.o. challenged by the nursing staff and has been able to tolerate p.o. without difficulty.  Appears stable for discharge at this time and she is agreeable.  Recommended continued use of her Reglan and Phenergan.  Recommended that she follow back up with her gastroenterologist tomorrow morning.  Discussed return precautions.  Her questions were answered and she was amicable at the time of discharge. ? ?Placido Sou, PA-C ?11/22/21 2126 ? ?  Placido Sou, PA-C ?11/22/21 2307 ? ?  ?Arby Barrette, MD ?11/24/21 2315 ? ?

## 2021-11-22 NOTE — ED Triage Notes (Addendum)
Pt states worsening vomiting and nausea with some left sided abdominal pain  ?Seen yesterday for same, dx with diverticulitis  ?Took reglan at 1300 and suppository phenergan at 1600, still vomiting ? ?Provider at bedside ?GI appointment on 3/21 ?

## 2021-11-22 NOTE — Discharge Instructions (Addendum)
I am prescribing you 2 different medications you can try for your nausea and vomiting. ? ?First medication is called Reglan.  This is the same medication you were given in the hospital today.  You can take this up to twice per day for your nausea and vomiting.  Please only take this if you are experiencing nausea and vomiting that you cannot control. ? ?The second medication is called Phenergan.  I am going to prescribe you suppositories.  Please use this if you are experiencing severe nausea and vomiting that you cannot control with oral antinausea medications.  Please only take this as prescribed. ? ?Please follow-up with your gastroenterologist regarding your symptoms as well as your visit today.  Please return to the emergency department with any new or worsening symptoms. ?

## 2021-11-22 NOTE — ED Notes (Signed)
Pt states no longer nauseated, asking for po intake ?Provider made aware ?Pt given graham crackers and ginger ale ?

## 2021-11-22 NOTE — ED Notes (Signed)
Pt ambulatory to the bathroom 

## 2021-11-22 NOTE — ED Notes (Signed)
Pt reports continued nausea, provider made aware.  ?

## 2021-11-22 NOTE — ED Notes (Signed)
Patient transported to CT 

## 2021-12-21 ENCOUNTER — Encounter (HOSPITAL_BASED_OUTPATIENT_CLINIC_OR_DEPARTMENT_OTHER): Payer: Self-pay

## 2021-12-21 ENCOUNTER — Other Ambulatory Visit: Payer: Self-pay

## 2021-12-21 ENCOUNTER — Emergency Department (HOSPITAL_BASED_OUTPATIENT_CLINIC_OR_DEPARTMENT_OTHER)
Admission: EM | Admit: 2021-12-21 | Discharge: 2021-12-22 | Disposition: A | Discharge status: Home / Self Care | Payer: Medicaid Other | Attending: Emergency Medicine | Admitting: Emergency Medicine

## 2021-12-21 DIAGNOSIS — G43109 Migraine with aura, not intractable, without status migrainosus: Secondary | ICD-10-CM | POA: Insufficient documentation

## 2021-12-21 DIAGNOSIS — R319 Hematuria, unspecified: Secondary | ICD-10-CM | POA: Insufficient documentation

## 2021-12-21 DIAGNOSIS — R519 Headache, unspecified: Secondary | ICD-10-CM

## 2021-12-21 LAB — URINALYSIS, ROUTINE W REFLEX MICROSCOPIC
Bilirubin Urine: NEGATIVE
Glucose, UA: NEGATIVE mg/dL
Ketones, ur: NEGATIVE mg/dL
Leukocytes,Ua: NEGATIVE
Nitrite: NEGATIVE
Protein, ur: NEGATIVE mg/dL
Specific Gravity, Urine: 1.015 (ref 1.005–1.030)
pH: 7 (ref 5.0–8.0)

## 2021-12-21 LAB — URINALYSIS, MICROSCOPIC (REFLEX): RBC / HPF: >50 RBC/hpf (ref 0–5)

## 2021-12-21 MED ORDER — DIPHENHYDRAMINE HCL 50 MG/ML IJ SOLN
50.0000 mg | Freq: Once | INTRAMUSCULAR | Status: DC
Start: 2021-12-21 — End: 2021-12-21
  Filled 2021-12-21: qty 1

## 2021-12-21 MED ORDER — METOCLOPRAMIDE HCL 5 MG/ML IJ SOLN
10.0000 mg | Freq: Once | INTRAMUSCULAR | Status: AC
Start: 2021-12-21 — End: 2021-12-21
  Administered 2021-12-21: 10 mg via INTRAVENOUS
  Filled 2021-12-21: qty 2

## 2021-12-21 MED ORDER — DIPHENHYDRAMINE HCL 50 MG/ML IJ SOLN
25.0000 mg | Freq: Once | INTRAMUSCULAR | Status: AC
Start: 2021-12-21 — End: 2021-12-21
  Administered 2021-12-21: 25 mg via INTRAVENOUS

## 2021-12-21 MED ORDER — MAGNESIUM SULFATE 2 GM/50ML IV SOLN
2.0000 g | Freq: Once | INTRAVENOUS | Status: AC
  Administered 2021-12-21: 2 g via INTRAVENOUS
  Filled 2021-12-21: qty 50

## 2021-12-21 MED ORDER — SODIUM CHLORIDE 0.9 % IV BOLUS
1000.0000 mL | Freq: Once | INTRAVENOUS | Status: AC
  Administered 2021-12-21: 1000 mL via INTRAVENOUS

## 2021-12-21 MED ORDER — KETOROLAC TROMETHAMINE 15 MG/ML IJ SOLN
15.0000 mg | Freq: Once | INTRAMUSCULAR | Status: AC
  Administered 2021-12-21: 15 mg via INTRAVENOUS
  Filled 2021-12-21: qty 1

## 2021-12-21 NOTE — ED Notes (Signed)
Patient is in bed, call bell within reach. Patient states she is experiencing migraine pain. Patient took 3 Imitrex at home with no relief.  ?

## 2021-12-21 NOTE — ED Triage Notes (Signed)
Pt arrives with c/o headache that started this morning. Per pt, she has vestibular migraines. Pt took ibuprofen and migraine meds without relief.  ?

## 2021-12-21 NOTE — ED Notes (Signed)
Patient ambulated to the bathroom. States her headache pain has not changed.  ?

## 2021-12-21 NOTE — ED Provider Notes (Signed)
?[facility] ?Provider Note ? ? ?CSN: [id number] ?Arrival date & time: 12/21/21  2054 ? ?  ? ?History ? ?Chief Complaint  ?Patient presents with  ? Headache  ? ? ?Katie Spencer is a 41 y.o. female who presents to the ED today with complaint of gradual onset, constant, left-sided headache that began earlier today.  Patient reports history of migraine headaches and states this feels similar.  She has tried multiple rounds of Tylenol, ibuprofen, Imitrex without relief.  She complains of aura, nausea, light sensitivity, sound sensitivity.  She denies any blurry vision, double vision, vomiting, unilateral weakness or numbness, speech changes, fevers, chills, neck stiffness, rash, any other associated symptoms. ? ?The history is provided by the patient and medical records.  ? ?  ? ?Home Medications ?Prior to Admission medications   ?Medication Sig Start Date End Date Taking? Authorizing Provider  ?butalbital-acetaminophen-caffeine (FIORICET) 50-325-40 MG tablet Take 1 tablet by mouth 2 (two) times daily as needed for headache.    [provider]  ?citalopram (CELEXA) 40 MG tablet Take 40 mg by mouth daily. 01/01/20   [provider]  ?dicyclomine (BENTYL) 20 MG tablet Take 1 tablet (20 mg total) by mouth every 8 (eight) hours as needed for spasms. 12/03/20   Petrucelli, Samantha R, PA-C  ?hydrOXYzine (ATARAX/VISTARIL) 25 MG tablet Take 25 mg by mouth 3 (three) times daily as needed for anxiety.  04/30/20   [provider]  ?lidocaine (LIDODERM) 5 % Place 1 patch onto the skin daily. Remove & Discard patch within 12 hours or as directed by MD 07/28/21   Redwine, Madison A, PA-C  ?lidocaine (XYLOCAINE) 2 % solution Use as directed 15 mLs in the mouth or throat every 6 (six) hours as needed for mouth pain. 03/03/21   Sherwood Gambler, MD  ?loperamide (IMODIUM) 2 MG capsule Take 1 capsule (2 mg total) by mouth 4 (four) times daily as needed for diarrhea or loose stools.  11/22/21   Redwine, Madison A, PA-C  ?metoCLOPramide (REGLAN) 10 MG tablet Take 1 tablet (10 mg total) by mouth 2 (two) times daily as needed for nausea. 11/22/21   Rayna Sexton, PA-C  ?MOBIC 15 MG tablet Take 15 mg by mouth daily as needed for pain. 10/05/20   [provider]  ?ondansetron (ZOFRAN ODT) 4 MG disintegrating tablet Take 1 tablet (4 mg total) by mouth every 8 (eight) hours as needed for nausea or vomiting. 12/03/20   Petrucelli, Samantha R, PA-C  ?pantoprazole (PROTONIX) 40 MG tablet Take 1 tablet (40 mg total) by mouth 2 (two) times daily. 11/22/21 12/22/21  Redwine, Madison A, PA-C  ?predniSONE (DELTASONE) 10 MG tablet Take 2 tablets (20 mg total) by mouth daily. 09/21/21   Sherrill Raring, PA-C  ?promethazine (PHENERGAN) 25 MG suppository Place 1 suppository (25 mg total) rectally every 8 (eight) hours as needed for nausea or vomiting. 11/22/21   Rayna Sexton, PA-C  ?sucralfate (CARAFATE) 1 GM/10ML suspension Take 10 mLs (1 g total) by mouth 4 (four) times daily -  with meals and at bedtime. 03/03/21   Sherwood Gambler, MD  ?tamsulosin (FLOMAX) 0.4 MG CAPS capsule Take 1 capsule (0.4 mg total) by mouth daily. 10/24/20   Jacqlyn Larsen, PA-C  ?VRAYLAR 6 MG CAPS Take 6 mg by mouth at bedtime.  01/02/20   [provider]  ?belladonna-opium (B&O SUPPRETTES) 16.2-30 MG suppository Place 1 suppository rectally every 8 (eight) hours as needed for pain. ?Patient not taking: Reported on 05/27/2020  05/20/20 08/18/20  Orpah Greek, MD  ?lansoprazole (PREVACID) 30 MG capsule Take 1 capsule daily while taking ibuprofen. ?Patient not taking: Reported on 05/27/2020 04/16/20 08/18/20  Molpus, Jenny Reichmann, MD  ?   ? ?Allergies    ?Morphine and related, Almond (diagnostic), and Nsaids   ? ?Review of Systems   ?Review of Systems  ?Constitutional:  Negative for chills and fever.  ?Eyes:  Positive for photophobia. Negative for visual disturbance.  ?Gastrointestinal:  Positive for nausea. Negative for vomiting.   ?Neurological:  Positive for headaches. Negative for weakness and numbness.  ?All other systems reviewed and are negative. ? ?Physical Exam ?Updated Vital Signs ?BP 121/75   Pulse 66   Temp 98.5 ?F (36.9 ?C) (Oral)   Resp 17   Ht 5\' 4"  (1.626 m)   Wt 103 kg   LMP 04/04/2020 Comment: neg preg test  SpO2 97%   BMI 38.96 kg/m?  ?Physical Exam ?Vitals and nursing note reviewed.  ?Constitutional:   ?   Appearance: She is not ill-appearing.  ?HENT:  ?   Head: Normocephalic and atraumatic.  ?Eyes:  ?   Extraocular Movements: Extraocular movements intact.  ?   Conjunctiva/sclera: Conjunctivae normal.  ?   Pupils: Pupils are equal, round, and reactive to light.  ?Cardiovascular:  ?   Rate and Rhythm: Normal rate and regular rhythm.  ?Pulmonary:  ?   Effort: Pulmonary effort is normal.  ?   Breath sounds: Normal breath sounds.  ?Abdominal:  ?   Palpations: Abdomen is soft.  ?   Tenderness: There is no abdominal tenderness.  ?Musculoskeletal:  ?   Cervical back: Neck supple.  ?Skin: ?   General: Skin is warm and dry.  ?Neurological:  ?   Mental Status: She is alert.  ?   Comments: Alert and oriented to self, place, time and event.  ? ?Speech is fluent, clear without dysarthria or dysphasia.  ? ?Strength 5/5 in upper/lower extremities   ?Sensation intact in upper/lower extremities  ? ?Normal gait.  ?Negative Romberg. No pronator drift.  ?Normal finger-to-nose and feet tapping.  ?CN I not tested  ?CN II grossly intact visual fields bilaterally. Did not visualize posterior eye.  ?CN III, IV, VI PERRLA and EOMs intact bilaterally  ?CN V Intact sensation to sharp and light touch to the face  ?CN VII facial movements symmetric  ?CN VIII not tested  ?CN IX, X no uvula deviation, symmetric rise of soft palate  ?CN XI 5/5 SCM and trapezius strength bilaterally  ?CN XII Midline tongue protrusion, symmetric L/R movements   ? ? ?ED Results / Procedures / Treatments   ?Labs ?(all labs ordered are listed, but only abnormal results  are displayed) ?Labs Reviewed  ?URINALYSIS, ROUTINE W REFLEX MICROSCOPIC - Abnormal; Notable for the following components:  ?    Result Value  ? APPearance CLOUDY (*)   ? Hgb urine dipstick LARGE (*)   ? All other components within normal limits  ?URINALYSIS, MICROSCOPIC (REFLEX) - Abnormal; Notable for the following components:  ? Bacteria, UA FEW (*)   ? All other components within normal limits  ? ? ?EKG ?None ? ?Radiology ?No results found. ? ?Procedures ?Procedures  ? ? ?Medications Ordered in ED ?Medications  ?magnesium sulfate IVPB 2 g 50 mL (2 g Intravenous New Bag/Given 12/21/21 2333)  ?ketorolac (TORADOL) 15 MG/ML injection 15 mg (15 mg Intravenous Given 12/21/21 2203)  ?metoCLOPramide (REGLAN) injection 10 mg (10 mg Intravenous Given 12/21/21 2203)  ?sodium  chloride 0.9 % bolus 1,000 mL (0 mLs Intravenous Stopped 12/21/21 2321)  ?diphenhydrAMINE (BENADRYL) injection 25 mg (25 mg Intravenous Given 12/21/21 2208)  ? ? ?ED Course/ Medical Decision Making/ A&P ?  ?                        ?Medical Decision Making ?41 year old female presents to the ED today with migraine headache that began earlier today.  On arrival vitals are stable.  The patient is sitting in darkened room as she has photophobia.  She is noted to have no focal neurodeficits on exam at this time.  He has tried multiple over-the-counter medications including her prescription of Imitrex without relief.  We will plan for headache cocktail at this time.  Given headache feels similar to previous migraine headaches I do not feel she requires additional work-up including labs or head imaging at this time.  We will continue to monitor in the ED and reevaluate. PSHX hysterectomy.  ? ?Nursing staff informed when patient went to the restroom she reported her urine seemed darker than normal.  Urinalysis collected at this time.  Does have large hemoglobin and greater than 50 red blood cells with few bacteria.  6-10 squamous epithelial cells.  Negative nitrites and  leuks.  Patient does follow with urology for chronic kidney stones.  She denies any pain at this time.  We will have her follow-up with urology for further evaluation.  ? ?On reevaluation after headache cocktail patient i

## 2021-12-22 NOTE — ED Notes (Signed)
Written and verbal inst to pt  Verbalized an understanding  To home  

## 2021-12-22 NOTE — Discharge Instructions (Addendum)
Please follow-up with your PCP for further evaluation.  Continue taking her Imitrex as prescribed. ? ?Follow-up with urologist as well given findings of blood in urine today.  ? ?Return to the ED for any new/worsening symptoms.  ?

## 2022-02-27 IMAGING — CT CT RENAL STONE PROTOCOL
2 of 4 series · 16 of 46 positions shown, 18 images · non-contrast
Comparison: CT abdomen/pelvis 05/09/2021

CLINICAL DATA: Flank pain, stent placed yesterday. Persistent pain
and hematuria

EXAM:
CT ABDOMEN AND PELVIS WITHOUT CONTRAST
TECHNIQUE: Multidetector CT imaging of the abdomen and pelvis was performed
following the standard protocol without IV contrast.

[Series 2: axial st · axial · 0.98mm/px · z∈[+768,+1194]mm · 13 of 93 slices shown, 15 images]
[im 4/93  soft-tissue]
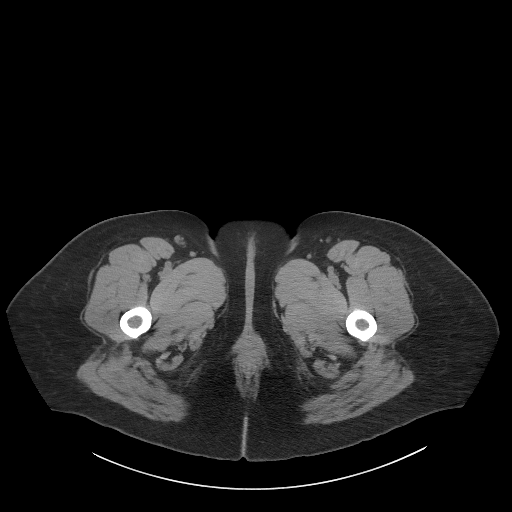
[im 4/93  bone]
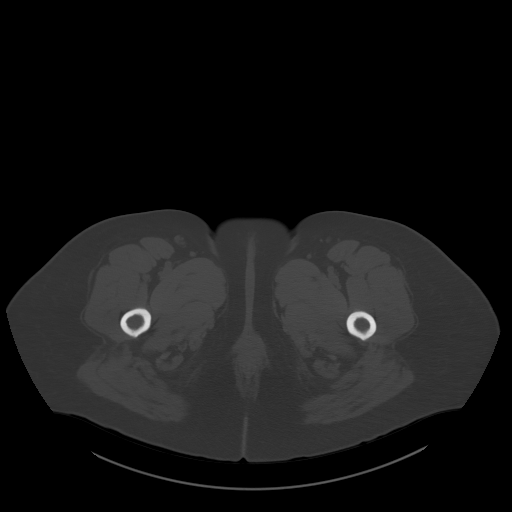
[im 12/93  soft-tissue]
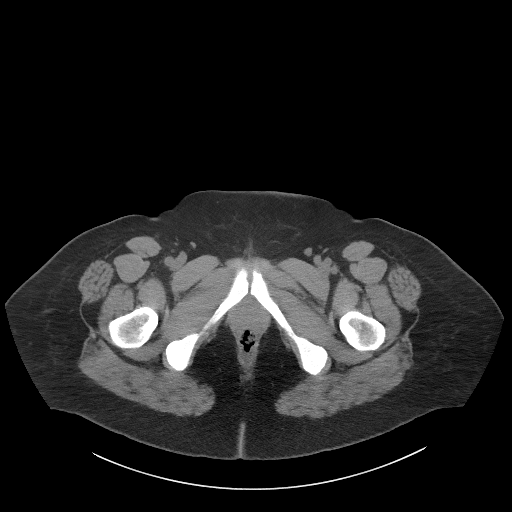
[im 19/93  soft-tissue]
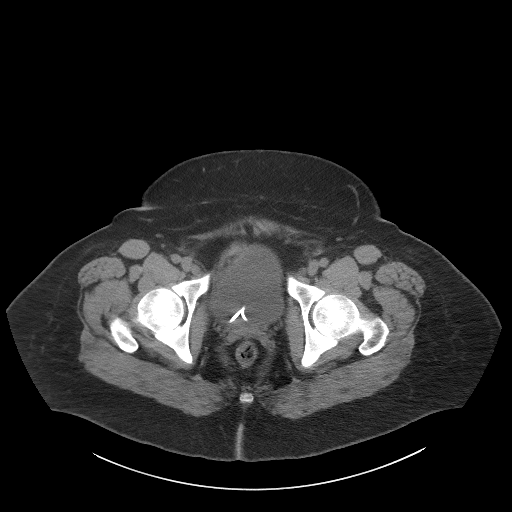
[im 26/93  soft-tissue]
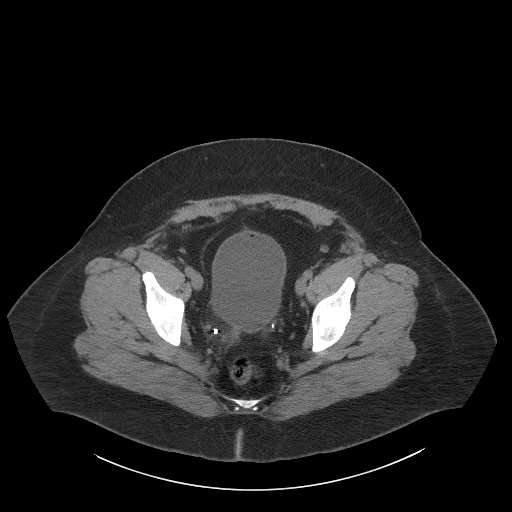
[im 34/93  soft-tissue]
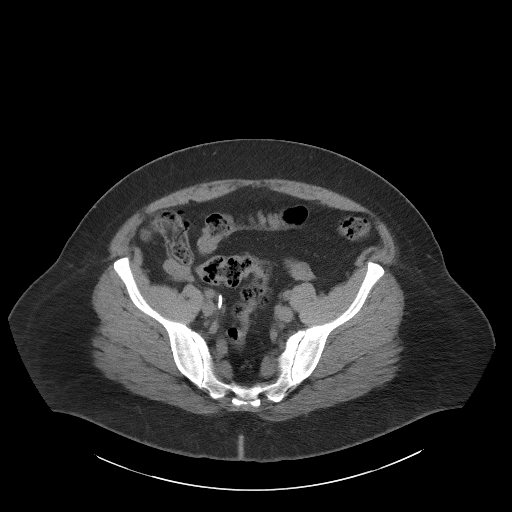
[im 41/93  soft-tissue]
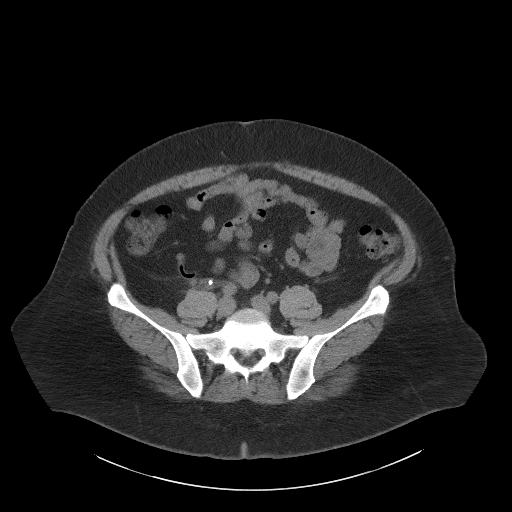
[im 48/93  soft-tissue]
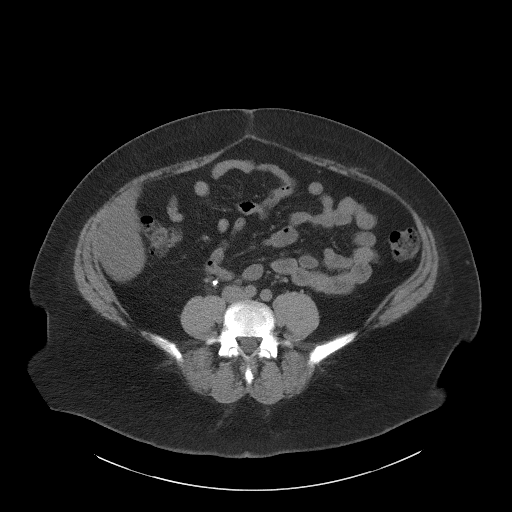
[im 52/93  soft-tissue]
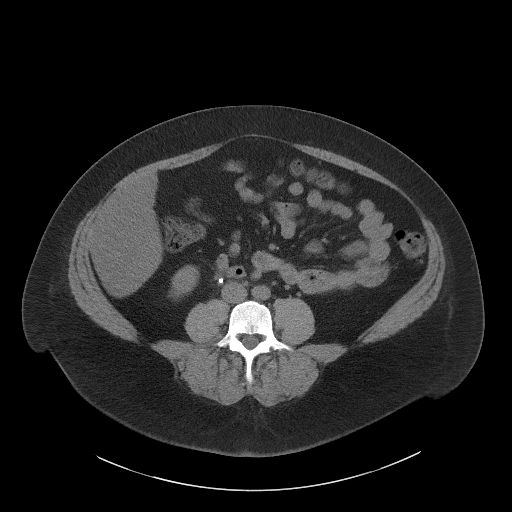
[im 59/93  soft-tissue]
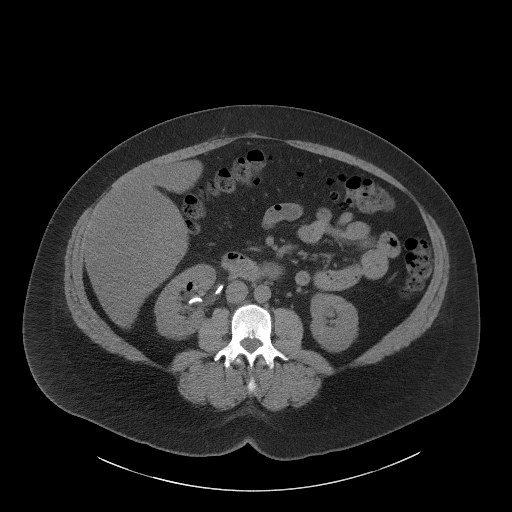
[im 59/93  bone]
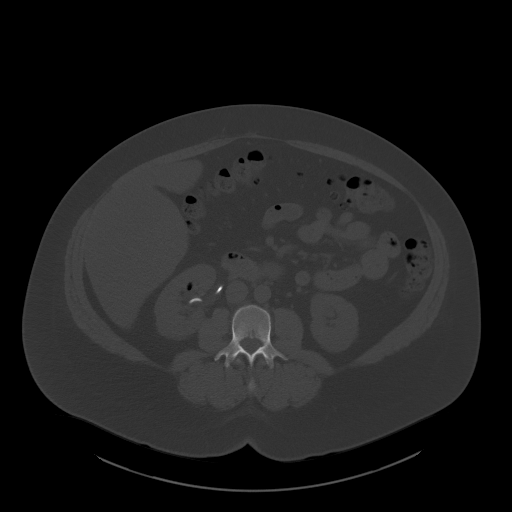
[im 67/93  soft-tissue]
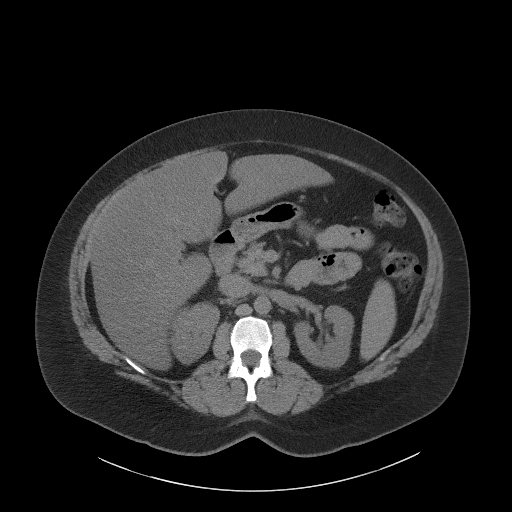
[im 74/93  soft-tissue]
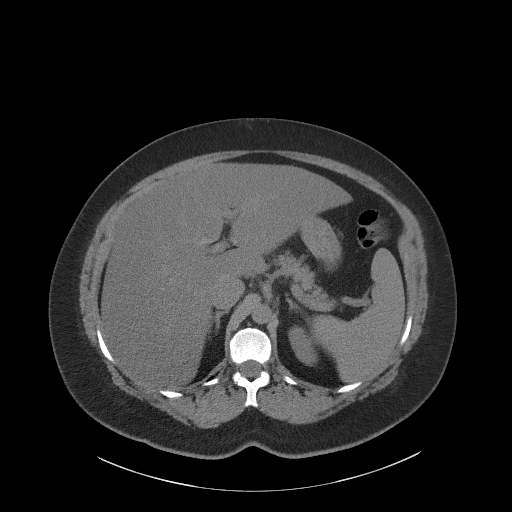
[im 81/93  soft-tissue]
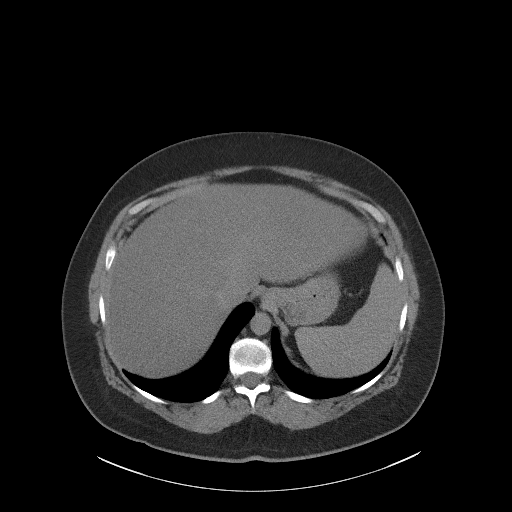
[im 89/93  soft-tissue]
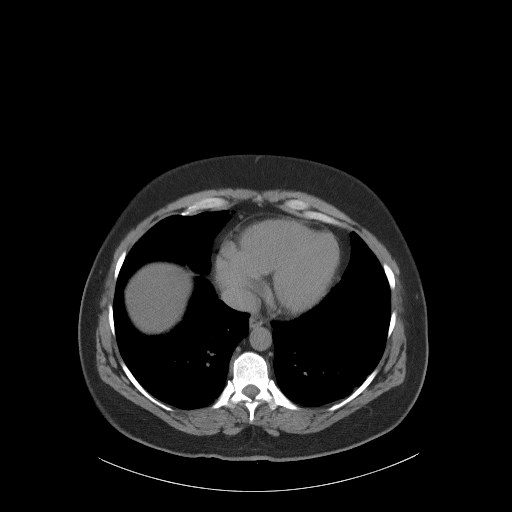

[Series 5: coronal st · coronal · 0.96mm/px · 3 of 120 slices shown]
[im 40/120  soft-tissue]
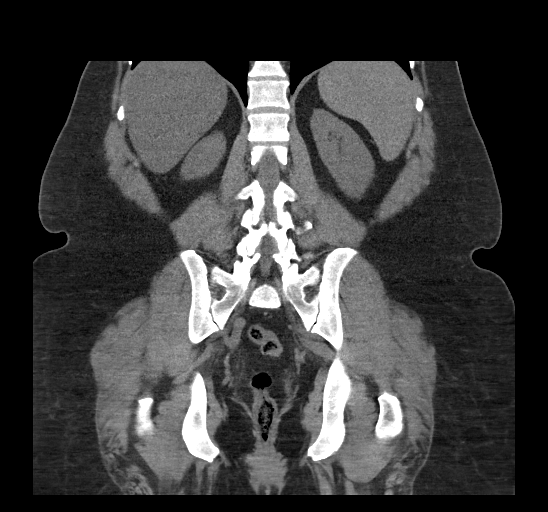
[im 53/120  soft-tissue]
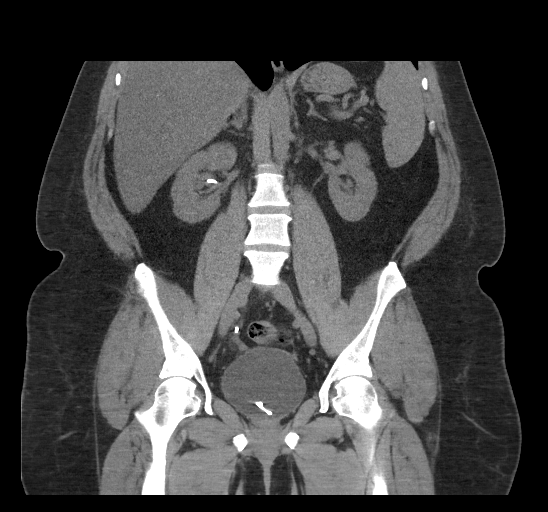
[im 67/120  soft-tissue]
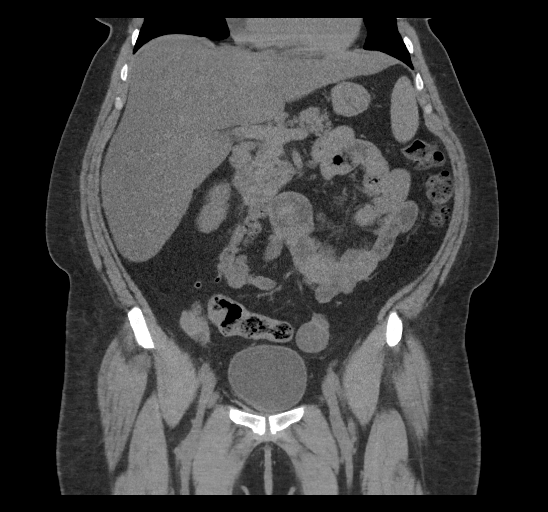

[16 of 46 positions shown; findings below may reference images not displayed]

FINDINGS: Lower chest: There is minimal dependent subsegmental atelectasis in
the lung bases. The imaged heart is unremarkable.

Hepatobiliary: The liver is diffusely hypoattenuating consistent
with fatty infiltration. The gallbladder is surgically absent. There
is no biliary ductal dilatation.

Pancreas: Unremarkable.

Spleen: Unremarkable.

Adrenals/Urinary Tract: The adrenals are unremarkable.

There is a right double-J stent in place with the proximal tip
coiled in the collecting system and the distal tip coiled in the
bladder. Small locules of air in the right collecting system is
likely related to the procedure. There is mild stranding in the fat
surrounding the distal ureter. Otherwise, there is no evidence of
postprocedural complication. There is no hydronephrosis or
hydroureter. The left kidney is normal. No stones are identified.
There is no left hydronephrosis or hydroureter.

There is a tiny locule of air in the bladder likely related to
instrumentation. The bladder is otherwise unremarkable.

Stomach/Bowel: The stomach is unremarkable. There is no evidence of
bowel obstruction. There is colonic diverticulosis without evidence
of acute diverticulitis.

Vascular/Lymphatic: The abdominal aorta is nonaneurysmal. There is
no abdominal or pelvic lymphadenopathy.

Reproductive: The uterus is surgically absent. There is a 3.4 cm
simple right ovarian cyst. There is no right adnexal lesion.

Other: There is no ascites or free air.

Musculoskeletal: There is no acute osseous abnormality or aggressive
osseous lesion.
IMPRESSION: 1. Status post right double-J stent placement with mild periureteral
fat stranding but no evidence of complication. There is no stone,
hydronephrosis, or hydroureter.
2. Hepatic steatosis.
3. Diverticulosis without evidence of acute diverticulitis.

## 2022-03-17 IMAGING — CT CT ABD-PELV W/ CM
2 of 5 series · 15 of 46 positions shown, 17 images · IV contrast (Omnipaque)
Comparison: 05/29/2021

CLINICAL DATA: Lower abdominal cramping, diverticulitis

EXAM:
CT ABDOMEN AND PELVIS WITH CONTRAST
TECHNIQUE: Multidetector CT imaging of the abdomen and pelvis was performed
using the standard protocol following bolus administration of
intravenous contrast.
CONTRAST:  85mL OMNIPAQUE IOHEXOL 350 MG/ML SOLN

[Series 2: axial st · axial · 0.98mm/px · z∈[+640,+1060]mm · 12 of 94 slices shown, 14 images]
[im 5/94  soft-tissue]
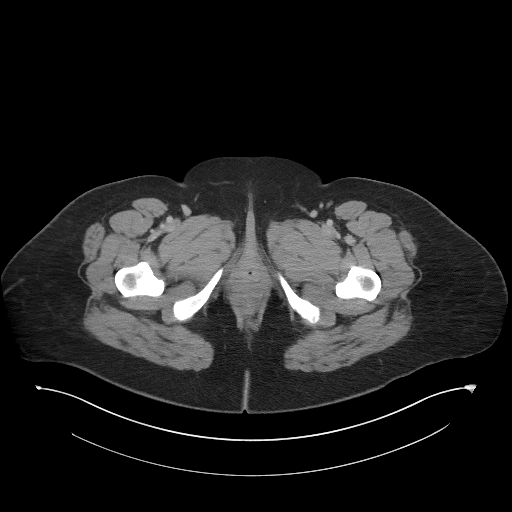
[im 5/94  bone]
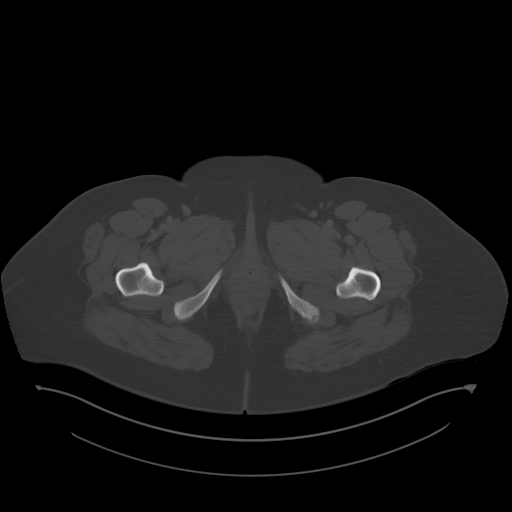
[im 15/94  soft-tissue]
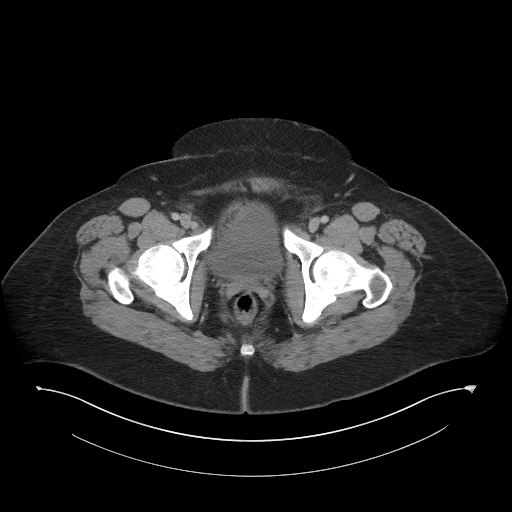
[im 20/94  soft-tissue]
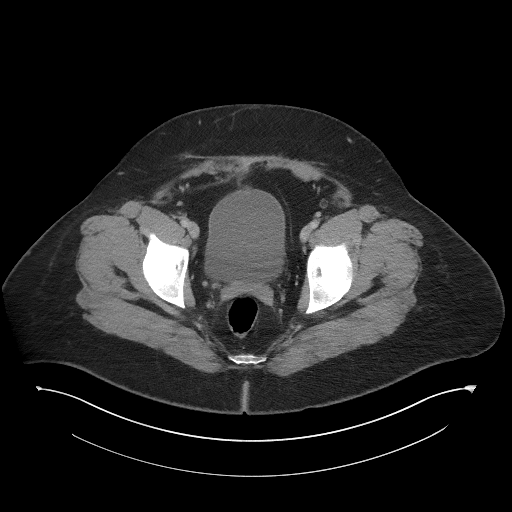
[im 30/94  soft-tissue]
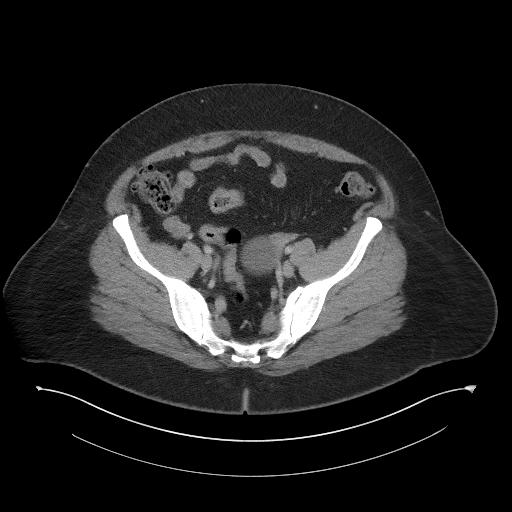
[im 35/94  soft-tissue]
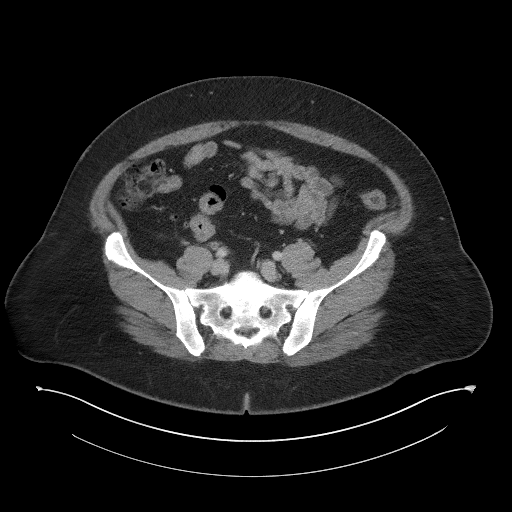
[im 45/94  soft-tissue]
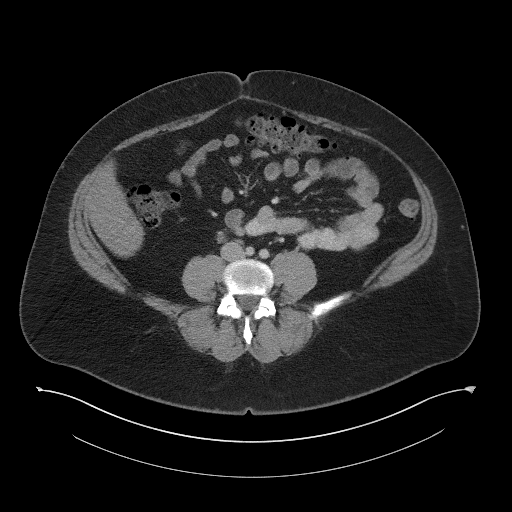
[im 49/94  soft-tissue]
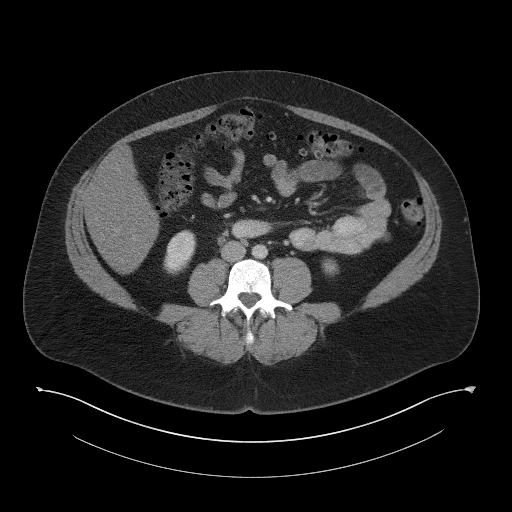
[im 59/94  soft-tissue]
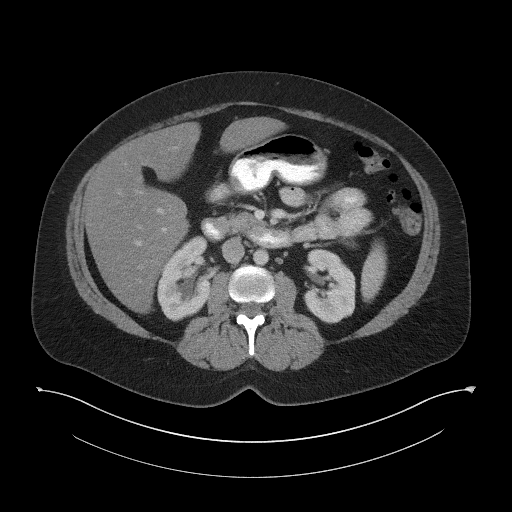
[im 64/94  soft-tissue]
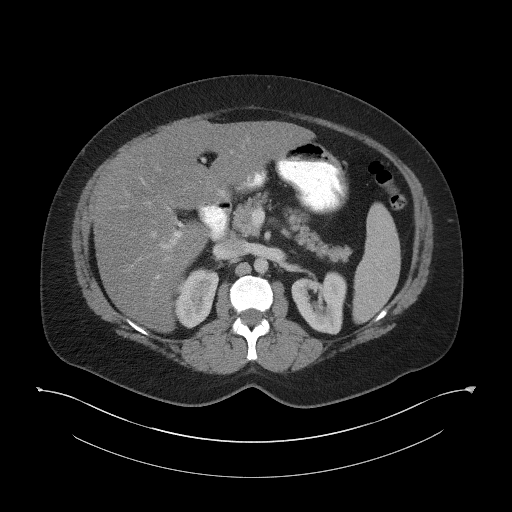
[im 64/94  bone]
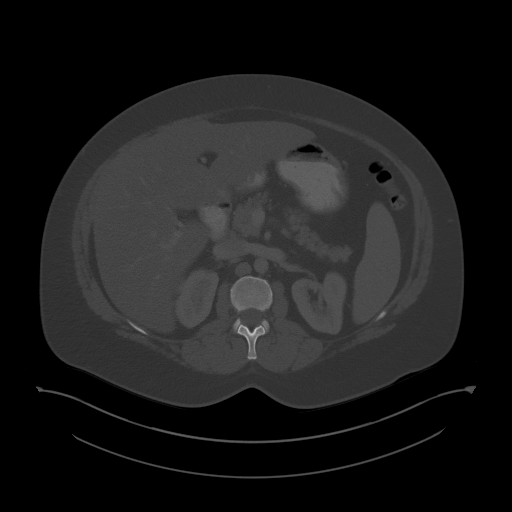
[im 74/94  soft-tissue]
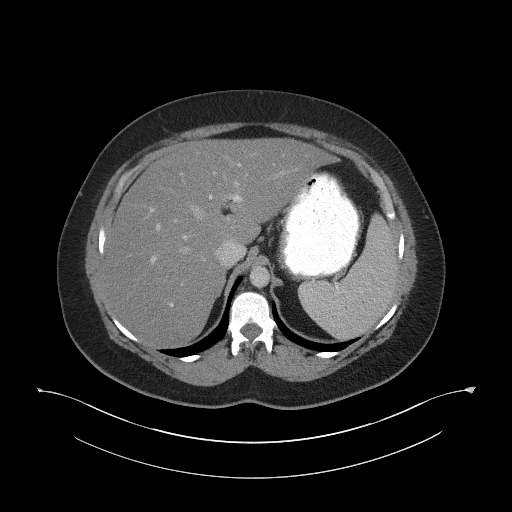
[im 79/94  soft-tissue]
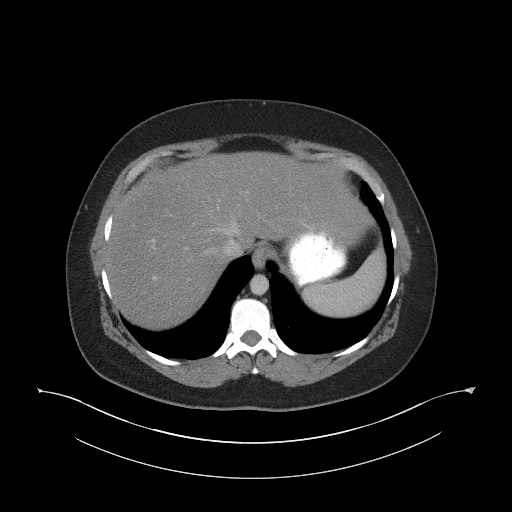
[im 89/94  soft-tissue]
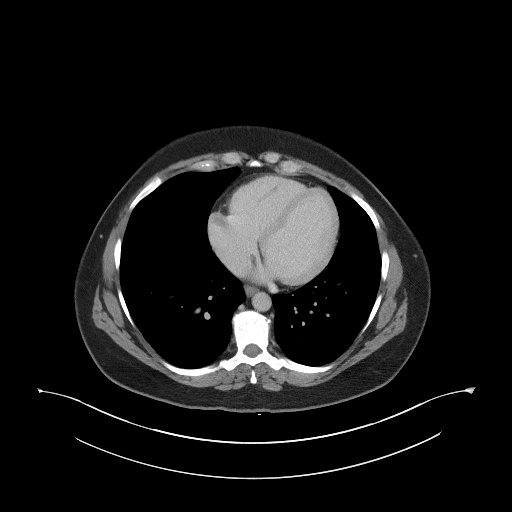

[Series 5: coronal st · coronal · 0.77mm/px · 3 of 102 slices shown]
[im 34/102  soft-tissue]
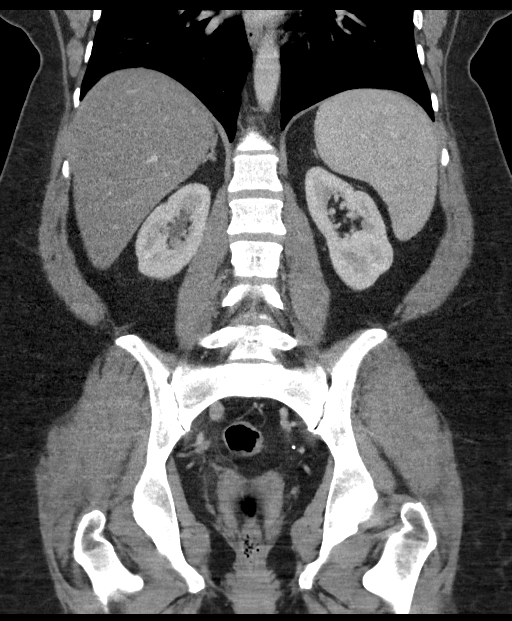
[im 45/102  soft-tissue]
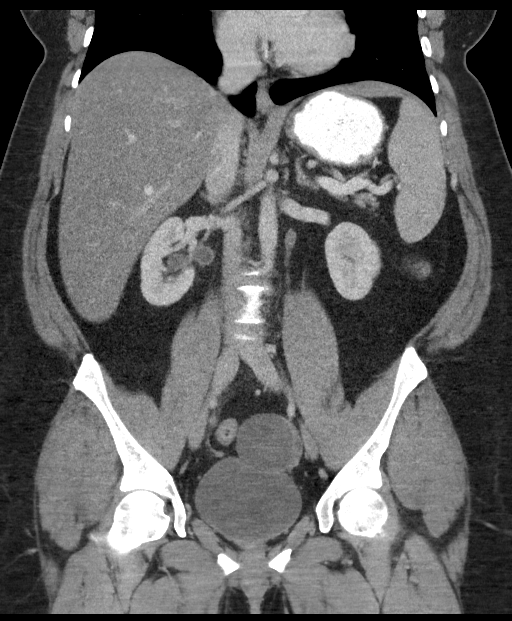
[im 57/102  soft-tissue]
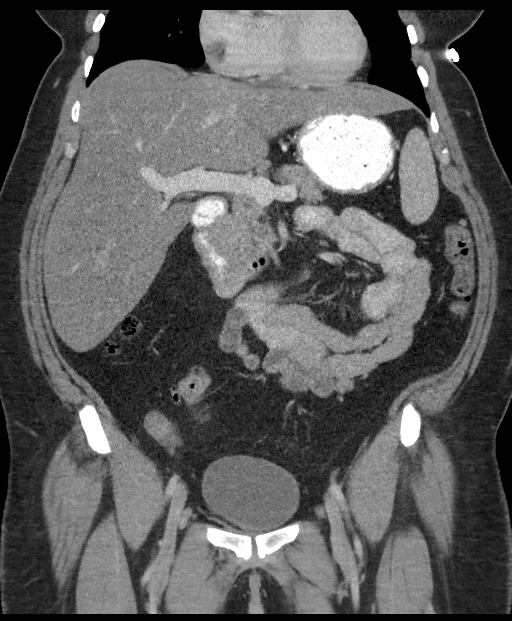

[15 of 46 positions shown; findings below may reference images not displayed]

FINDINGS: Lower chest: The visualized lung bases are clear. The visualized
heart and pericardium are unremarkable.

Hepatobiliary: Moderate hepatic steatosis. Mild hepatomegaly. No
enhancing intrahepatic mass. No intra or extrahepatic biliary ductal
dilation. Cholecystectomy has been performed.

Pancreas: Unremarkable

Spleen: Unremarkable

Adrenals/Urinary Tract: The adrenal glands are unremarkable. The
kidneys are normal in size and position. Previously noted right
ureteral stent has been removed. There has developed mild right
hydronephrosis and hydroureter, new since prior examination.
Urothelial enhancement and moderate periureteric inflammatory
stranding is present on the right which is nonspecific in the
setting of recent ureteral stenting, however, superimposed infection
could appear similarly. No nephro or urolithiasis. No hydronephrosis
on the left. No enhancing intrarenal masses. The bladder is
unremarkable.

Stomach/Bowel: There is mild-to-moderate scattered diverticulosis
throughout the colon. The stomach, small bowel, and large bowel are
otherwise unremarkable. No evidence of obstruction or focal
inflammation. The appendix is normal. No free intraperitoneal gas or
fluid.

Vascular/Lymphatic: The abdominal vasculature is unremarkable. No
pathologic adenopathy within the abdomen and pelvis.

Reproductive: Uterus absent. Residual right ovary is unremarkable.
Left ovarian cystic lesion demonstrates interval increase in size,
now measuring 5.0 x 5.3 cm, and demonstrates a single thin internal
septation. The left ovary demonstrates normal enhancement arguing
against ovarian torsion and, as such, this most likely represents an
enlarging follicular cyst.

Other: No abdominal wall hernia. Scarring within the a
extraperitoneal soft tissue anterior to the bladder likely
represents postsurgical change related to prior hysterectomy. Rectum
unremarkable.

Musculoskeletal: No acute bone abnormality. No lytic or blastic bone
lesion.
IMPRESSION: Interval removal of right ureteral stent with development of
recurrent mild right hydronephrosis and hydroureter. Urothelial
enhancement and periureteric inflammatory stranding is nonspecific
in the setting of recent stenting, however, superimposed infection
could appear similarly and correlation with urinalysis and urine
culture may be helpful.

Enlarging left ovarian minimally complex cyst most in keeping with a
follicular cyst. This could be better assessed with dedicated
transvaginal pelvic sonography, if indicated.

Moderate hepatic steatosis and mild hepatomegaly, unchanged.

Diverticulosis without superimposed acute inflammatory change.

## 2022-04-04 ENCOUNTER — Other Ambulatory Visit: Payer: Self-pay

## 2022-04-04 ENCOUNTER — Emergency Department (HOSPITAL_BASED_OUTPATIENT_CLINIC_OR_DEPARTMENT_OTHER)
Admission: EM | Admit: 2022-04-04 | Discharge: 2022-04-04 | Disposition: A | Discharge status: Home / Self Care | Payer: Medicaid Other | Attending: Emergency Medicine | Admitting: Emergency Medicine

## 2022-04-04 ENCOUNTER — Encounter (HOSPITAL_BASED_OUTPATIENT_CLINIC_OR_DEPARTMENT_OTHER): Payer: Self-pay | Admitting: Pediatrics

## 2022-04-04 DIAGNOSIS — N83202 Unspecified ovarian cyst, left side: Secondary | ICD-10-CM | POA: Insufficient documentation

## 2022-04-04 DIAGNOSIS — R1031 Right lower quadrant pain: Secondary | ICD-10-CM | POA: Diagnosis present

## 2022-04-04 DIAGNOSIS — M25579 Pain in unspecified ankle and joints of unspecified foot: Secondary | ICD-10-CM | POA: Insufficient documentation

## 2022-04-04 MED ORDER — OXYCODONE HCL 5 MG PO TABS: 5.0000 mg | ORAL_TABLET | Freq: Four times a day (QID) | ORAL | 0 refills | Status: AC | PRN

## 2022-04-04 MED ORDER — OXYCODONE HCL 5 MG PO TABS
10.0000 mg | ORAL_TABLET | Freq: Once | ORAL | Status: AC
  Administered 2022-04-04: 10 mg via ORAL
  Filled 2022-04-04: qty 2

## 2022-04-04 MED ORDER — ONDANSETRON 4 MG PO TBDP
4.0000 mg | ORAL_TABLET | Freq: Once | ORAL | Status: AC
  Administered 2022-04-04: 4 mg via ORAL
  Filled 2022-04-04: qty 1

## 2022-04-04 NOTE — Discharge Instructions (Signed)
Follow-up with your established OB/GYN and keep your appointment for worsening left-sided ovarian pain

## 2022-04-04 NOTE — ED Triage Notes (Signed)
Complained of left lower abdominal pain; reported was seen at [facility] and was diagnosed with ovarian cyst; states pending OP OBGYN appt; here for ongoing pain;    Patient c/o right ankle pain as well; states shooting pain when rotating ankle internally.

## 2022-04-04 NOTE — ED Provider Notes (Signed)
[facility] EMERGENCY DEPARTMENT Provider Note   CSN: [id number] Arrival date & time: 04/04/22  1851     History  Chief Complaint  Patient presents with   Abdominal Pain   Ankle Pain    Katie Spencer is a 41 y.o. female.  Patient is a 40 year old female ending for right lower quadrant pain.  Patient was recently seen at [facility] for same pain and was diagnosed with left ovarian cyst with negative CT abdomen and pelvis scan with IV contrast ruling out any other pathology.  Patient has a history of hysterectomy.  Denies any vaginal pain, discharge, with urination, increased frequency, urgency.  Denies any flank pain or hematuria.  Denies fevers, chills, nausea, vomiting, diarrhea.  Currently taking Motrin and Tylenol with minimal improvement of pain.  Has follow-up appointment with OB/GYN established already.  Requesting further assistance with pain control.  The history is provided by the patient. No language interpreter was used.  Abdominal Pain Associated symptoms: no chest pain, no chills, no cough, no dysuria, no fever, no hematuria, no shortness of breath, no sore throat and no vomiting   Ankle Pain Associated symptoms: no back pain and no fever        Home Medications Prior to Admission medications   Medication Sig Start Date End Date Taking? Authorizing Provider  butalbital-acetaminophen-caffeine (FIORICET) 50-325-40 MG tablet Take 1 tablet by mouth 2 (two) times daily as needed for headache.    [provider]  citalopram (CELEXA) 40 MG tablet Take 40 mg by mouth daily. 01/01/20   [provider]  dicyclomine (BENTYL) 20 MG tablet Take 1 tablet (20 mg total) by mouth every 8 (eight) hours as needed for spasms. 12/03/20   Petrucelli, Samantha R, PA-C  hydrOXYzine (ATARAX/VISTARIL) 25 MG tablet Take 25 mg by mouth 3 (three) times daily as needed for anxiety.  04/30/20   [provider]  lidocaine (LIDODERM) 5 %  Place 1 patch onto the skin daily. Remove & Discard patch within 12 hours or as directed by MD 07/28/21   Redwine, Madison A, PA-C  lidocaine (XYLOCAINE) 2 % solution Use as directed 15 mLs in the mouth or throat every 6 (six) hours as needed for mouth pain. 03/03/21   Pricilla Loveless, MD  loperamide (IMODIUM) 2 MG capsule Take 1 capsule (2 mg total) by mouth 4 (four) times daily as needed for diarrhea or loose stools. 11/22/21   Redwine, Madison A, PA-C  metoCLOPramide (REGLAN) 10 MG tablet Take 1 tablet (10 mg total) by mouth 2 (two) times daily as needed for nausea. 11/22/21   Placido Sou, PA-C  MOBIC 15 MG tablet Take 15 mg by mouth daily as needed for pain. 10/05/20   [provider]  ondansetron (ZOFRAN ODT) 4 MG disintegrating tablet Take 1 tablet (4 mg total) by mouth every 8 (eight) hours as needed for nausea or vomiting. 12/03/20   Petrucelli, Samantha R, PA-C  pantoprazole (PROTONIX) 40 MG tablet Take 1 tablet (40 mg total) by mouth 2 (two) times daily. 11/22/21 12/22/21  Redwine, Madison A, PA-C  predniSONE (DELTASONE) 10 MG tablet Take 2 tablets (20 mg total) by mouth daily. 09/21/21   Theron Arista, PA-C  promethazine (PHENERGAN) 25 MG suppository Place 1 suppository (25 mg total) rectally every 8 (eight) hours as needed for nausea or vomiting. 11/22/21   Placido Sou, PA-C  sucralfate (CARAFATE) 1 GM/10ML suspension Take 10 mLs (1 g total) by mouth 4 (four) times daily -  with meals and at bedtime. 03/03/21   Pricilla Loveless, MD  tamsulosin (FLOMAX) 0.4 MG CAPS capsule Take 1 capsule (0.4 mg total) by mouth daily. 10/24/20   Dartha Lodge, PA-C  VRAYLAR 6 MG CAPS Take 6 mg by mouth at bedtime.  01/02/20   [provider]  belladonna-opium (B&O SUPPRETTES) 16.2-30 MG suppository Place 1 suppository rectally every 8 (eight) hours as needed for pain. Patient not taking: Reported on 05/27/2020 05/20/20 08/18/20  Gilda Crease, MD  lansoprazole (PREVACID) 30 MG capsule Take 1  capsule daily while taking ibuprofen. Patient not taking: Reported on 05/27/2020 04/16/20 08/18/20  Molpus, Jonny Ruiz, MD      Allergies    Morphine and related, Almond (diagnostic), and Nsaids    Review of Systems   Review of Systems  Constitutional:  Negative for chills and fever.  HENT:  Negative for ear pain and sore throat.   Eyes:  Negative for pain and visual disturbance.  Respiratory:  Negative for cough and shortness of breath.   Cardiovascular:  Negative for chest pain and palpitations.  Gastrointestinal:  Positive for abdominal pain. Negative for vomiting.  Genitourinary:  Negative for dysuria and hematuria.  Musculoskeletal:  Negative for arthralgias and back pain.  Skin:  Negative for color change and rash.  Neurological:  Negative for seizures and syncope.  All other systems reviewed and are negative.   Physical Exam Updated Vital Signs BP 136/87   Pulse (!) 59   Temp 98.7 F (37.1 C) (Oral)   Resp 17   Ht 5\' 4"  (1.626 m)   Wt 104.3 kg   LMP 04/04/2020 Comment: neg preg test  SpO2 99%   BMI 39.48 kg/m  Physical Exam Vitals and nursing note reviewed.  Constitutional:      General: She is not in acute distress.    Appearance: She is well-developed.  HENT:     Head: Normocephalic and atraumatic.  Eyes:     Conjunctiva/sclera: Conjunctivae normal.  Cardiovascular:     Rate and Rhythm: Normal rate and regular rhythm.     Heart sounds: No murmur heard. Pulmonary:     Effort: Pulmonary effort is normal. No respiratory distress.     Breath sounds: Normal breath sounds.  Abdominal:     Palpations: Abdomen is soft.     Tenderness: There is abdominal tenderness in the left lower quadrant. There is no guarding or rebound.  Musculoskeletal:        General: No swelling.     Cervical back: Neck supple.  Skin:    General: Skin is warm and dry.     Capillary Refill: Capillary refill takes less than 2 seconds.  Neurological:     Mental Status: She is alert.   Psychiatric:        Mood and Affect: Mood normal.     ED Results / Procedures / Treatments   Labs (all labs ordered are listed, but only abnormal results are displayed) Labs Reviewed - No data to display  EKG None  Radiology No results found.  Procedures Procedures    Medications Ordered in ED Medications  oxyCODONE (Oxy IR/ROXICODONE) immediate release tablet 10 mg (has no administration in time range)  ondansetron (ZOFRAN-ODT) disintegrating tablet 4 mg (has no administration in time range)    ED Course/ Medical Decision Making/ A&P                           Medical Decision  Making Risk Prescription drug management.   87:28 PM 41 year old female ending for right lower quadrant pain.  Is alert and oriented x3, afebrile, stable vital signs.  Minimal tenderness of the left lower quadrant.  Low suspicion for torsion.  History of hysterectomy.  Patient had CT abdomen pelvis with contrast on 03/29/2022 demonstrating: "A complex appearing lesion in the left adnexa,  likely of ovarian origin. The possibility of neoplasm should be  considered, and further evaluation with transvaginal pelvic  ultrasound is strongly recommended at this time to better evaluate  this finding"    Patient then had follow-up transvaginal ultrasound also on 03/29/2022 demonstrating: "Left ovary -Measurements: 3.5 x 2.9 x 2.8 cm = volume: 14.8 mL. Complex cyst  with heterogeneous internal echogenicity and lacy septations,  appearance consistent with a hemorrhagic cyst, lesion measuring 3.3  x 1.9 x 2.4 cm; no follow-up imaging recommended"   Patient symptoms likely secondary to cyst.  Patient given for pain control in ED.  Recommended to keep appointment OB/GYN for recurrent pain from left ovarian cyst  Patient in no distress and overall condition improved here in the ED. Detailed discussions were had with the patient regarding current findings, and need for close f/u with PCP or on call doctor. The  patient has been instructed to return immediately if the symptoms worsen in any way for re-evaluation. Patient verbalized understanding and is in agreement with current care plan. All questions answered prior to discharge.            Final Clinical Impression(s) / ED Diagnoses Final diagnoses:  Left ovarian cyst    Rx / DC Orders ED Discharge Orders     None         Franne Forts, DO 04/04/22 2255

## 2022-04-26 ENCOUNTER — Other Ambulatory Visit: Payer: Self-pay

## 2022-04-26 ENCOUNTER — Emergency Department (HOSPITAL_BASED_OUTPATIENT_CLINIC_OR_DEPARTMENT_OTHER): Payer: Medicaid Other

## 2022-04-26 ENCOUNTER — Encounter (HOSPITAL_BASED_OUTPATIENT_CLINIC_OR_DEPARTMENT_OTHER): Payer: Self-pay | Admitting: Emergency Medicine

## 2022-04-26 ENCOUNTER — Emergency Department (HOSPITAL_BASED_OUTPATIENT_CLINIC_OR_DEPARTMENT_OTHER)
Admission: EM | Admit: 2022-04-26 | Discharge: 2022-04-26 | Disposition: A | Discharge status: Home / Self Care | Payer: Medicaid Other | Attending: Emergency Medicine | Admitting: Emergency Medicine

## 2022-04-26 DIAGNOSIS — M545 Low back pain, unspecified: Secondary | ICD-10-CM | POA: Diagnosis present

## 2022-04-26 DIAGNOSIS — R209 Unspecified disturbances of skin sensation: Secondary | ICD-10-CM | POA: Insufficient documentation

## 2022-04-26 MED ORDER — KETOROLAC TROMETHAMINE 60 MG/2ML IM SOLN
30.0000 mg | Freq: Once | INTRAMUSCULAR | Status: AC
  Administered 2022-04-26: 30 mg via INTRAMUSCULAR
  Filled 2022-04-26: qty 2

## 2022-04-26 MED ORDER — OXYCODONE-ACETAMINOPHEN 5-325 MG PO TABS
1.0000 | ORAL_TABLET | Freq: Once | ORAL | Status: AC
  Administered 2022-04-26: 1 via ORAL
  Filled 2022-04-26: qty 1

## 2022-04-26 NOTE — ED Triage Notes (Signed)
Pt states issue with lower back has been see for same. Was sleeping on couch and fell on a stand. Now legs and groin is numb.

## 2022-04-26 NOTE — ED Provider Notes (Signed)
[facility] EMERGENCY DEPARTMENT Provider Note   CSN: [id number] Arrival date & time: 04/26/22  0327     History  Chief Complaint  Patient presents with   Back Pain    Katie Spencer is a 41 y.o. female.  The history is provided by the patient and medical records.  Back Pain Katie Spencer is a 41 y.o. female who presents to the Emergency Department complaining of back pain.  She fell asleep on the cough and rolled off and landed on a metal stand on the floor.  Has sharp and shooting pain down both legs is numb in entire bilateral lower extremities (tingling).  Pain is located in the mid low back.  Sometimes has numbness.  [place] with similar pain that occurred after turning with numbness in BLE with loss of bladder.  Had an MRI then without a lesion that required intervention.   Has experienced occasional loss of bladder control.  No fever, abdominal pain, N/V, dysuria, urgency, frequency.    Hx/o kidney stones  Scheduled to follow up with Urology in October  S/p hysterectomy, cholecystectomy.    Takes gabapentin 600mg TID. Just finished hydrocodone and prednisone that was prescribed from recent evaluation for back pain.    Uses tobacco, no alcohol, no drugs.      Home Medications Prior to Admission medications   Medication Sig Start Date End Date Taking? Authorizing Provider  butalbital-acetaminophen-caffeine (FIORICET) 50-325-40 MG tablet Take 1 tablet by mouth 2 (two) times daily as needed for headache.    [provider]  citalopram (CELEXA) 40 MG tablet Take 40 mg by mouth daily. 01/01/20   [provider]  dicyclomine (BENTYL) 20 MG tablet Take 1 tablet (20 mg total) by mouth every 8 (eight) hours as needed for spasms. 12/03/20   Petrucelli, Samantha R, PA-C  hydrOXYzine (ATARAX/VISTARIL) 25 MG tablet Take 25 mg by mouth 3 (three) times daily as needed for anxiety.  04/30/20   [provider]  lidocaine  (LIDODERM) 5 % Place 1 patch onto the skin daily. Remove & Discard patch within 12 hours or as directed by MD 07/28/21   Redwine, Madison A, PA-C  lidocaine (XYLOCAINE) 2 % solution Use as directed 15 mLs in the mouth or throat every 6 (six) hours as needed for mouth pain. 03/03/21   03/05/21, MD  loperamide (IMODIUM) 2 MG capsule Take 1 capsule (2 mg total) by mouth 4 (four) times daily as needed for diarrhea or loose stools. 11/22/21   Redwine, Madison A, PA-C  metoCLOPramide (REGLAN) 10 MG tablet Take 1 tablet (10 mg total) by mouth 2 (two) times daily as needed for nausea. 11/22/21   01/22/22, PA-C  MOBIC 15 MG tablet Take 15 mg by mouth daily as needed for pain. 10/05/20   [provider]  ondansetron (ZOFRAN ODT) 4 MG disintegrating tablet Take 1 tablet (4 mg total) by mouth every 8 (eight) hours as needed for nausea or vomiting. 12/03/20   Petrucelli, Samantha R, PA-C  pantoprazole (PROTONIX) 40 MG tablet Take 1 tablet (40 mg total) by mouth 2 (two) times daily. 11/22/21 12/22/21  Redwine, Madison A, PA-C  predniSONE (DELTASONE) 10 MG tablet Take 2 tablets (20 mg total) by mouth daily. 09/21/21   11/19/21, PA-C  promethazine (PHENERGAN) 25 MG suppository Place 1 suppository (25 mg total) rectally every 8 (eight) hours as needed for nausea or vomiting. 11/22/21   01/22/22, PA-C  sucralfate (CARAFATE) 1  GM/10ML suspension Take 10 mLs (1 g total) by mouth 4 (four) times daily -  with meals and at bedtime. 03/03/21   Pricilla Loveless, MD  tamsulosin (FLOMAX) 0.4 MG CAPS capsule Take 1 capsule (0.4 mg total) by mouth daily. 10/24/20   Dartha Lodge, PA-C  VRAYLAR 6 MG CAPS Take 6 mg by mouth at bedtime.  01/02/20   [provider]  belladonna-opium (B&O SUPPRETTES) 16.2-30 MG suppository Place 1 suppository rectally every 8 (eight) hours as needed for pain. Patient not taking: Reported on 05/27/2020 05/20/20 08/18/20  Gilda Crease, MD  lansoprazole (PREVACID) 30 MG  capsule Take 1 capsule daily while taking ibuprofen. Patient not taking: Reported on 05/27/2020 04/16/20 08/18/20  Molpus, Jonny Ruiz, MD      Allergies    Morphine and related, Almond (diagnostic), and Nsaids    Review of Systems   Review of Systems  Musculoskeletal:  Positive for back pain.  All other systems reviewed and are negative.   Physical Exam Updated Vital Signs BP 127/75 (BP Location: Right Arm)   Pulse 90   Temp 98.3 F (36.8 C)   Resp 18   Ht 5\' 4"  (1.626 m)   Wt 104.3 kg   LMP 04/04/2020 Comment: neg preg test  SpO2 100%   BMI 39.48 kg/m  Physical Exam Vitals and nursing note reviewed.  Constitutional:      Appearance: She is well-developed.  HENT:     Head: Normocephalic and atraumatic.  Cardiovascular:     Rate and Rhythm: Normal rate and regular rhythm.     Heart sounds: No murmur heard. Pulmonary:     Effort: Pulmonary effort is normal. No respiratory distress.     Breath sounds: Normal breath sounds.  Abdominal:     Palpations: Abdomen is soft.     Tenderness: There is no abdominal tenderness. There is no guarding or rebound.  Musculoskeletal:        General: No tenderness.     Comments: 2+ DP pulses bilaterally.  TTP over mid and lower lumbar spine and paraspinous muscles.    Skin:    General: Skin is warm and dry.  Neurological:     Mental Status: She is alert and oriented to person, place, and time.     Comments: 5/5 strength in BLE in prox/distal groups.  Altered sensation to light touch in bilateral lower extremities.  2+ patellar reflexes bilaterally.  No ankle clonus.   Psychiatric:        Behavior: Behavior normal.     ED Results / Procedures / Treatments   Labs (all labs ordered are listed, but only abnormal results are displayed) Labs Reviewed - No data to display  EKG None  Radiology DG Lumbar Spine Complete  Result Date: 04/26/2022 CLINICAL DATA:  41 year old female status post fall with leg and groin numbness. EXAM: LUMBAR SPINE -  COMPLETE 4+ VIEW COMPARISON:  Lumbar radiographs 04/15/2022. FINDINGS: Normal lumbar segmentation. Stable lumbar lordosis, vertebral height and alignment. Bone mineralization is within normal limits. No pars fracture. Visible sacrum and SI joints appear grossly intact. Stable and relatively preserved disc spaces. Mild lower lumbar facet hypertrophy. Stable cholecystectomy clips. Negative visible abdominal and pelvic visceral contours. IMPRESSION: No acute osseous abnormality identified in the lumbar spine. Electronically Signed   By: 04/17/2022 M.D.   On: 04/26/2022 04:35    Procedures Procedures    Medications Ordered in ED Medications  ketorolac (TORADOL) injection 30 mg (has no administration in time range)  oxyCODONE-acetaminophen (PERCOCET/ROXICET) 5-325 MG per tablet 1 tablet (has no administration in time range)    ED Course/ Medical Decision Making/ A&P                           Medical Decision Making Amount and/or Complexity of Data Reviewed Radiology: ordered.  Risk Prescription drug management.   Patient here for acute on chronic low back pain with paresthesias to bilateral lower extremities.  Strength and reflexes are intact.  She has had similar episodes in the past.  Plain films are negative for acute fracture/dislocation.  She recently completed a course of prednisone as well as hydrocodone.  Do not feel recurrent steroid dose or course will be beneficial at this point in time.  Records reviewed from [facility]-patient with MRI lumbar spine performed at that point in time with no intervenable lesions.  Will treat symptoms in the department.  Plan to discharge home with outpatient follow-up and return precautions.       Final Clinical Impression(s) / ED Diagnoses Final diagnoses:  Lumbar back pain    Rx / DC Orders ED Discharge Orders     None         Tilden Fossa, MD 04/26/22 [phone]

## 2022-05-07 ENCOUNTER — Emergency Department (HOSPITAL_COMMUNITY)
Admission: EM | Admit: 2022-05-07 | Discharge: 2022-05-08 | Disposition: A | Discharge status: Home / Self Care | Payer: Medicaid Other | Attending: Emergency Medicine | Admitting: Emergency Medicine

## 2022-05-07 ENCOUNTER — Other Ambulatory Visit: Payer: Self-pay

## 2022-05-07 DIAGNOSIS — F17210 Nicotine dependence, cigarettes, uncomplicated: Secondary | ICD-10-CM | POA: Diagnosis not present

## 2022-05-07 DIAGNOSIS — M5441 Lumbago with sciatica, right side: Secondary | ICD-10-CM | POA: Diagnosis not present

## 2022-05-07 DIAGNOSIS — M5442 Lumbago with sciatica, left side: Secondary | ICD-10-CM | POA: Diagnosis not present

## 2022-05-07 DIAGNOSIS — M545 Low back pain, unspecified: Secondary | ICD-10-CM | POA: Diagnosis present

## 2022-05-07 MED ORDER — HYDROMORPHONE HCL 2 MG/ML IJ SOLN
1.0000 mg | Freq: Once | INTRAMUSCULAR | Status: AC
  Administered 2022-05-07: 1 mg via INTRAMUSCULAR
  Filled 2022-05-07: qty 1

## 2022-05-07 MED ORDER — METHOCARBAMOL 500 MG PO TABS
1000.0000 mg | ORAL_TABLET | Freq: Once | ORAL | Status: AC
  Administered 2022-05-07: 1000 mg via ORAL
  Filled 2022-05-07: qty 2

## 2022-05-07 MED ORDER — ACETAMINOPHEN 500 MG PO TABS
1000.0000 mg | ORAL_TABLET | Freq: Once | ORAL | Status: AC
  Administered 2022-05-07: 1000 mg via ORAL
  Filled 2022-05-07: qty 2

## 2022-05-07 NOTE — ED Triage Notes (Signed)
Patient coming to ED for evaluation of lower back pain.  Hx of chronic pain.  States "I was helping a friend get groceries in and I turned wrong."  States pain "goes across my lower back and shots down my legs."

## 2022-05-07 NOTE — ED Provider Notes (Signed)
[facility]-EMERGENCY DEPT [facility] Emergency Department Provider Note MRN:  [id number]  Arrival date & time: 05/07/22     Chief Complaint   Back Pain   History of Present Illness   Katie Spencer is a 41 y.o. year-old female with a history of chronic back pain presenting to the ED with chief complaint of back pain.  Patient "threw out her back" while putting groceries into her car today.  Pain is located in the mid lumbar back with radiation down the back of both legs.  This has happened before.  Denies numbness or weakness to the legs, no bowel or bladder dysfunction or loss of control.  No fever, no trauma.  Review of Systems  A thorough review of systems was obtained and all systems are negative except as noted in the HPI and PMH.   Patient's Health History    Past Medical History:  Diagnosis Date   Abnormal vaginal bleeding    Chronic abdominal pain    Renal disorder     Past Surgical History:  Procedure Laterality Date   ABDOMINAL HYSTERECTOMY     CESAREAN SECTION  2006, 2009, 2013, 2014   CHOLECYSTECTOMY      Family History  Adopted: Yes  Problem Relation Age of Onset   Cancer Mother    Cancer Other     Social History   Socioeconomic History   Marital status: Married    Spouse name: Not on file   Number of children: Not on file   Years of education: Not on file   Highest education level: Not on file  Occupational History   Not on file  Tobacco Use   Smoking status: Every Day    Packs/day: 1.00    Types: Cigarettes   Smokeless tobacco: Never  Vaping Use   Vaping Use: Some days   Substances: Nicotine  Substance and Sexual Activity   Alcohol use: Not Currently   Drug use: Never   Sexual activity: Not on file  Other Topics Concern   Not on file  Social History Narrative   Not on file   Social Determinants of Health   Financial Resource Strain: Not on file  Food Insecurity: Not on file  Transportation Needs: Not on file  Physical Activity:  Not on file  Stress: Not on file  Social Connections: Not on file  Intimate Partner Violence: Not on file     Physical Exam   Vitals:   05/07/22 2235  BP: (!) 147/104  Pulse: 85  Resp: 18  Temp: 98.2 F (36.8 C)  SpO2: 99%    CONSTITUTIONAL: Well-appearing, appears moderately uncomfortable, hesitant to move NEURO/PSYCH:  Alert and oriented x 3, no focal deficits EYES:  eyes equal and reactive ENT/NECK:  no LAD, no JVD CARDIO: Regular rate, well-perfused, normal S1 and S2 PULM:  CTAB no wheezing or rhonchi GI/GU:  non-distended, non-tender MSK/SPINE:  No gross deformities, no edema SKIN:  no rash, atraumatic   *Additional and/or pertinent findings included in MDM below  Diagnostic and Interventional Summary    EKG Interpretation  Date/Time:    Ventricular Rate:    PR Interval:    QRS Duration:   QT Interval:    QTC Calculation:   R Axis:     Text Interpretation:         Labs Reviewed - No data to display  No orders to display    Medications  HYDROmorphone (DILAUDID) injection 1 mg (1 mg Intramuscular Given 05/07/22 2338)  methocarbamol (  ROBAXIN) tablet 1,000 mg (1,000 mg Oral Given 05/07/22 2338)  acetaminophen (TYLENOL) tablet 1,000 mg (1,000 mg Oral Given 05/07/22 2338)     Procedures  /  Critical Care Procedures  ED Course and Medical Decision Making  Initial Impression and Ddx Suspicious for muscle strain or spasm, acute on chronic back pain without signs or symptoms of myelopathy.  No fever.  No trauma, no indication for imaging.  Will provide some symptomatic management, anticipating discharge.  Past medical/surgical history that increases complexity of ED encounter: Chronic back pain  Interpretation of Diagnostics Laboratory and/or imaging options to aid in the diagnosis/care of the patient were considered.  After careful history and physical examination, it was determined that there was no indication for diagnostics at this time.  Patient  Reassessment and Ultimate Disposition/Management     Discharge  Patient management required discussion with the following services or consulting groups:  None  Complexity of Problems Addressed Acute illness or injury that poses threat of life of bodily function  Additional Data Reviewed and Analyzed Further history obtained from: Past medical history and medications listed in the EMR and Prior ED visit notes, PMP review  Additional Factors Impacting ED Encounter Risk Prescriptions  Elmer Sow. Pilar Plate, MD [facility] Emergency Medicine [facility] [email or web address]  Final Clinical Impressions(s) / ED Diagnoses     ICD-10-CM   1. Bilateral low back pain with bilateral sciatica, unspecified chronicity  M54.42    M54.41       ED Discharge Orders     None        Discharge Instructions Discussed with and Provided to Patient:   Discharge Instructions   None      Sabas Sous, MD 05/07/22 2351

## 2022-05-08 MED ORDER — METHOCARBAMOL 500 MG PO TABS: 500.0000 mg | ORAL_TABLET | Freq: Three times a day (TID) | ORAL | 0 refills | Status: DC | PRN

## 2022-05-08 MED ORDER — LIDOCAINE 5 % EX PTCH: 1.0000 | MEDICATED_PATCH | CUTANEOUS | 0 refills | Status: DC

## 2022-05-08 NOTE — Discharge Instructions (Signed)
You were evaluated in the Emergency Department and after careful evaluation, we did not find any emergent condition requiring admission or further testing in the hospital.  Your exam/testing today is overall reassuring.  Recommend follow-up with your back specialist.  Please return to the Emergency Department if you experience any worsening of your condition.   Thank you for allowing [place] to be a part of your care.

## 2022-05-09 ENCOUNTER — Emergency Department (HOSPITAL_COMMUNITY): Payer: Medicaid Other

## 2022-05-09 ENCOUNTER — Encounter (HOSPITAL_COMMUNITY): Payer: Self-pay

## 2022-05-09 ENCOUNTER — Emergency Department (HOSPITAL_COMMUNITY)
Admission: EM | Admit: 2022-05-09 | Discharge: 2022-05-10 | Disposition: A | Discharge status: Home / Self Care | Payer: Medicaid Other | Attending: Emergency Medicine | Admitting: Emergency Medicine

## 2022-05-09 ENCOUNTER — Other Ambulatory Visit: Payer: Self-pay

## 2022-05-09 DIAGNOSIS — M5442 Lumbago with sciatica, left side: Secondary | ICD-10-CM | POA: Insufficient documentation

## 2022-05-09 DIAGNOSIS — I10 Essential (primary) hypertension: Secondary | ICD-10-CM | POA: Insufficient documentation

## 2022-05-09 DIAGNOSIS — M5441 Lumbago with sciatica, right side: Secondary | ICD-10-CM | POA: Insufficient documentation

## 2022-05-09 DIAGNOSIS — R531 Weakness: Secondary | ICD-10-CM | POA: Insufficient documentation

## 2022-05-09 DIAGNOSIS — Z79899 Other long term (current) drug therapy: Secondary | ICD-10-CM | POA: Insufficient documentation

## 2022-05-09 DIAGNOSIS — M545 Low back pain, unspecified: Secondary | ICD-10-CM | POA: Diagnosis present

## 2022-05-09 LAB — URINALYSIS, ROUTINE W REFLEX MICROSCOPIC
Bilirubin Urine: NEGATIVE
Glucose, UA: >=500 mg/dL — AB
Hgb urine dipstick: NEGATIVE
Ketones, ur: NEGATIVE mg/dL
Leukocytes,Ua: NEGATIVE
Nitrite: NEGATIVE
Protein, ur: NEGATIVE mg/dL
Specific Gravity, Urine: 1.02 (ref 1.005–1.030)
pH: 7 (ref 5.0–8.0)

## 2022-05-09 LAB — CBC WITH DIFFERENTIAL/PLATELET
Abs Immature Granulocytes: 0.09 10*3/uL — ABNORMAL HIGH (ref 0.00–0.07)
Basophils Absolute: 0.1 10*3/uL (ref 0.0–0.1)
Basophils Relative: 1 %
Eosinophils Absolute: 0.2 10*3/uL (ref 0.0–0.5)
Eosinophils Relative: 1 %
HCT: 37.9 % (ref 36.0–46.0)
Hemoglobin: 13.5 g/dL (ref 12.0–15.0)
Immature Granulocytes: 1 %
Lymphocytes Relative: 28 %
Lymphs Abs: 3.4 10*3/uL (ref 0.7–4.0)
MCH: 33.1 pg (ref 26.0–34.0)
MCHC: 35.6 g/dL (ref 30.0–36.0)
MCV: 92.9 fL (ref 80.0–100.0)
Monocytes Absolute: 0.7 10*3/uL (ref 0.1–1.0)
Monocytes Relative: 6 %
Neutro Abs: 7.7 10*3/uL (ref 1.7–7.7)
Neutrophils Relative %: 63 %
Platelets: 234 10*3/uL (ref 150–400)
RBC: 4.08 MIL/uL (ref 3.87–5.11)
RDW: 11.9 % (ref 11.5–15.5)
WBC: 12.1 10*3/uL — ABNORMAL HIGH (ref 4.0–10.5)
nRBC: 0 % (ref 0.0–0.2)

## 2022-05-09 LAB — SEDIMENTATION RATE: Sed Rate: 14 mm/hr (ref 0–22)

## 2022-05-09 LAB — BASIC METABOLIC PANEL
Anion gap: 10 (ref 5–15)
BUN: 11 mg/dL (ref 6–20)
CO2: 20 mmol/L — ABNORMAL LOW (ref 22–32)
Calcium: 9 mg/dL (ref 8.9–10.3)
Chloride: 105 mmol/L (ref 98–111)
Creatinine, Ser: 0.69 mg/dL (ref 0.44–1.00)
GFR, Estimated: >60 mL/min (ref >60)
Glucose, Bld: 161 mg/dL — ABNORMAL HIGH (ref 70–99)
Potassium: 3.6 mmol/L (ref 3.5–5.1)
Sodium: 135 mmol/L (ref 135–145)

## 2022-05-09 LAB — C-REACTIVE PROTEIN: CRP: 1.7 mg/dL — ABNORMAL HIGH (ref <1.0)

## 2022-05-09 MED ORDER — ONDANSETRON 4 MG PO TBDP
4.0000 mg | ORAL_TABLET | Freq: Once | ORAL | Status: AC
  Administered 2022-05-09: 4 mg via ORAL
  Filled 2022-05-09: qty 1

## 2022-05-09 MED ORDER — OXYCODONE HCL 5 MG PO TABS
5.0000 mg | ORAL_TABLET | ORAL | Status: AC
  Administered 2022-05-09: 5 mg via ORAL
  Filled 2022-05-09: qty 1

## 2022-05-09 MED ORDER — LORAZEPAM 0.5 MG PO TABS: 0.5000 mg | ORAL_TABLET | ORAL | Status: DC | PRN

## 2022-05-09 MED ORDER — HYDROMORPHONE HCL 2 MG/ML IJ SOLN
1.0000 mg | Freq: Once | INTRAMUSCULAR | Status: AC
  Administered 2022-05-09: 1 mg via INTRAVENOUS
  Filled 2022-05-09: qty 1

## 2022-05-09 MED ORDER — LORAZEPAM 2 MG/ML IJ SOLN
1.0000 mg | INTRAMUSCULAR | Status: AC | PRN
  Administered 2022-05-09: 1 mg via INTRAVENOUS
  Filled 2022-05-09: qty 1

## 2022-05-09 MED ORDER — LORAZEPAM 2 MG/ML IJ SOLN: 0.5000 mg | INTRAMUSCULAR | Status: DC | PRN

## 2022-05-09 MED ORDER — GADOBUTROL 1 MMOL/ML IV SOLN
10.0000 mL | Freq: Once | INTRAVENOUS | Status: AC | PRN
  Administered 2022-05-09: 10 mL via INTRAVENOUS

## 2022-05-09 NOTE — ED Provider Notes (Signed)
[facility]-EMERGENCY DEPT Provider Note   CSN: [id number] Arrival date & time: 05/09/22  1450     History  Chief Complaint  Patient presents with   Back Pain    Katie Spencer is a 41 y.o. female.   Back Pain Associated symptoms: numbness and weakness      41 year old female with medical history significant for chronic low back pain who presents with acute on chronic low back pain.  The patient states that symptoms have acutely worsened over the past 2 days.  She denies any fecal or urinary incontinence but states that she now has saddle anesthesia.  She endorses weakness in the bilateral lower extremities which is unclear if this is due to pain associated with her sciatica or true weakness.  She denies any recent falls or trauma.  She denies any recent IV drug abuse.  Home Medications Prior to Admission medications   Medication Sig Start Date End Date Taking? Authorizing Provider  butalbital-acetaminophen-caffeine (FIORICET) 50-325-40 MG tablet Take 1 tablet by mouth 2 (two) times daily as needed for headache.    [provider]  citalopram (CELEXA) 40 MG tablet Take 40 mg by mouth daily. 01/01/20   [provider]  dicyclomine (BENTYL) 20 MG tablet Take 1 tablet (20 mg total) by mouth every 8 (eight) hours as needed for spasms. 12/03/20   Petrucelli, Samantha R, PA-C  hydrOXYzine (ATARAX/VISTARIL) 25 MG tablet Take 25 mg by mouth 3 (three) times daily as needed for anxiety.  04/30/20   [provider]  lidocaine (LIDODERM) 5 % Place 1 patch onto the skin daily. Remove & Discard patch within 12 hours or as directed by MD 05/08/22   Sabas Sous, MD  lidocaine (XYLOCAINE) 2 % solution Use as directed 15 mLs in the mouth or throat every 6 (six) hours as needed for mouth pain. 03/03/21   Pricilla Loveless, MD  loperamide (IMODIUM) 2 MG capsule Take 1 capsule (2 mg total) by mouth 4 (four) times daily as needed for diarrhea or loose stools.  11/22/21   Redwine, Madison A, PA-C  methocarbamol (ROBAXIN) 500 MG tablet Take 1 tablet (500 mg total) by mouth every 8 (eight) hours as needed for muscle spasms. 05/08/22   Sabas Sous, MD  metoCLOPramide (REGLAN) 10 MG tablet Take 1 tablet (10 mg total) by mouth 2 (two) times daily as needed for nausea. 11/22/21   Placido Sou, PA-C  MOBIC 15 MG tablet Take 15 mg by mouth daily as needed for pain. 10/05/20   [provider]  ondansetron (ZOFRAN ODT) 4 MG disintegrating tablet Take 1 tablet (4 mg total) by mouth every 8 (eight) hours as needed for nausea or vomiting. 12/03/20   Petrucelli, Samantha R, PA-C  pantoprazole (PROTONIX) 40 MG tablet Take 1 tablet (40 mg total) by mouth 2 (two) times daily. 11/22/21 12/22/21  Redwine, Madison A, PA-C  predniSONE (DELTASONE) 10 MG tablet Take 2 tablets (20 mg total) by mouth daily. 09/21/21   Theron Arista, PA-C  promethazine (PHENERGAN) 25 MG suppository Place 1 suppository (25 mg total) rectally every 8 (eight) hours as needed for nausea or vomiting. 11/22/21   Placido Sou, PA-C  sucralfate (CARAFATE) 1 GM/10ML suspension Take 10 mLs (1 g total) by mouth 4 (four) times daily -  with meals and at bedtime. 03/03/21   Pricilla Loveless, MD  tamsulosin (FLOMAX) 0.4 MG CAPS capsule Take 1 capsule (0.4 mg total) by mouth daily. 10/24/20   Dartha Lodge,  PA-C  VRAYLAR 6 MG CAPS Take 6 mg by mouth at bedtime.  01/02/20   [provider]  belladonna-opium (B&O SUPPRETTES) 16.2-30 MG suppository Place 1 suppository rectally every 8 (eight) hours as needed for pain. Patient not taking: Reported on 05/27/2020 05/20/20 08/18/20  Gilda Crease, MD  lansoprazole (PREVACID) 30 MG capsule Take 1 capsule daily while taking ibuprofen. Patient not taking: Reported on 05/27/2020 04/16/20 08/18/20  Molpus, Jonny Ruiz, MD      Allergies    Morphine and related, Almond (diagnostic), and Nsaids    Review of Systems   Review of Systems  Musculoskeletal:  Positive for  back pain.  Neurological:  Positive for weakness and numbness.  All other systems reviewed and are negative.   Physical Exam Updated Vital Signs BP 114/78 (BP Location: Left Arm)   Pulse 88   Temp 98.8 F (37.1 C) (Oral)   Resp 18   Ht 5\' 4"  (1.626 m)   Wt 104.3 kg   LMP 04/04/2020 Comment: neg preg test  SpO2 98%   BMI 39.48 kg/m  Physical Exam Vitals and nursing note reviewed.  Constitutional:      General: She is not in acute distress.    Appearance: She is well-developed.  HENT:     Head: Normocephalic and atraumatic.  Eyes:     Conjunctiva/sclera: Conjunctivae normal.  Cardiovascular:     Rate and Rhythm: Normal rate and regular rhythm.  Pulmonary:     Effort: Pulmonary effort is normal. No respiratory distress.     Breath sounds: Normal breath sounds.  Abdominal:     Palpations: Abdomen is soft.     Tenderness: There is no abdominal tenderness.  Musculoskeletal:        General: No swelling.     Cervical back: Neck supple.     Comments: Tenderness to palpation about the midline of the thoracic and lumbar spine, positive straight leg raise bilaterally  Skin:    General: Skin is warm and dry.     Capillary Refill: Capillary refill takes less than 2 seconds.  Neurological:     Mental Status: She is alert and oriented to person, place, and time.     Cranial Nerves: No cranial nerve deficit.     Comments: 4-5 strength in the bilateral lower extremities, unclear if effort dependent due to pain, intact sensation to light touch in the bilateral lower extremities  Psychiatric:        Mood and Affect: Mood normal.     ED Results / Procedures / Treatments   Labs (all labs ordered are listed, but only abnormal results are displayed) Labs Reviewed  URINALYSIS, ROUTINE W REFLEX MICROSCOPIC - Abnormal; Notable for the following components:      Result Value   APPearance HAZY (*)    Glucose, UA >=500 (*)    Bacteria, UA RARE (*)    All other components within normal  limits  CBC WITH DIFFERENTIAL/PLATELET - Abnormal; Notable for the following components:   WBC 12.1 (*)    Abs Immature Granulocytes 0.09 (*)    All other components within normal limits  BASIC METABOLIC PANEL - Abnormal; Notable for the following components:   CO2 20 (*)    Glucose, Bld 161 (*)    All other components within normal limits  SEDIMENTATION RATE  C-REACTIVE PROTEIN    EKG None  Radiology DG Lumbar Spine Complete  Result Date: 05/09/2022 CLINICAL DATA:  Low back pain EXAM: LUMBAR SPINE - COMPLETE 4+  VIEW COMPARISON:  04/26/2022 FINDINGS: There is no evidence of lumbar spine fracture. Alignment is normal. Intervertebral disc spaces are maintained. IMPRESSION: Negative. Electronically Signed   By: Charlett Nose M.D.   On: 05/09/2022 17:05    Procedures Procedures    Medications Ordered in ED Medications  LORazepam (ATIVAN) tablet 0.5 mg (has no administration in time range)  LORazepam (ATIVAN) injection 0.5 mg (has no administration in time range)  LORazepam (ATIVAN) injection 1 mg (has no administration in time range)  oxyCODONE (Oxy IR/ROXICODONE) immediate release tablet 5 mg (5 mg Oral Given 05/09/22 1532)  oxyCODONE (Oxy IR/ROXICODONE) immediate release tablet 5 mg (5 mg Oral Given 05/09/22 1901)  HYDROmorphone (DILAUDID) injection 1 mg (1 mg Intravenous Given 05/09/22 1956)    ED Course/ Medical Decision Making/ A&P                           Medical Decision Making Amount and/or Complexity of Data Reviewed Labs: ordered. Radiology: ordered.  Risk Prescription drug management.    41 year old female with medical history significant for chronic low back pain who presents with acute on chronic low back pain.  The patient states that symptoms have acutely worsened over the past 2 days.  She denies any fecal or urinary incontinence but states that she now has saddle anesthesia.  She endorses weakness in the bilateral lower extremities which is unclear if this is  due to pain associated with her sciatica or true weakness.  She denies any recent falls or trauma.  She denies any recent IV drug abuse.  On arrival, the patient was vitally stable, mild sinus tachycardia noted pulse 105, afebrile temperature 98.4, hypertensive BP 150/104, saturating well on room air.  Physical exam significant for bilateral lower extremity weakness, unclear if effort dependent due to positive straight leg raise test bilaterally.  Patient is now endorsing saddle anesthesia which has been ongoing for the past hour.  Due to this, will order MRI imaging of the thoracic and lumbar spine as the patient is tender in both of those areas.  MRI imaging pending at time of signout.  Pending MRI results, plan for likely discharge with outpatient neurosurgical follow-up if showing degenerative disc disease.  Dispo ending MRI results.  Signout given to Dr. Earle Gell at 2330.   Final Clinical Impression(s) / ED Diagnoses Final diagnoses:  Acute midline low back pain with bilateral sciatica    Rx / DC Orders ED Discharge Orders     None         Ernie Avena, MD 05/09/22 2349

## 2022-05-09 NOTE — ED Provider Triage Note (Signed)
Emergency Medicine Provider Triage Evaluation Note  Katie Spencer , a 41 y.o. female  was evaluated in triage.  Pt complains of lower back pain after getting into her car. The patient reports that she was here two days ago for similar symptoms. She has tried calling her back specialist but he is out on vacation. They instructed her to come here. Denies any bowel or bladder incontinence. Denies any urinary retention, saddle anesthesia, fevers, or IVDU.   Review of Systems  Positive:  Negative:   Physical Exam  BP (!) 152/104 (BP Location: Right Arm)   Pulse (!) 105   Temp 98.4 F (36.9 C) (Oral)   Resp 20   Ht 5\' 4"  (1.626 m)   Wt 104.3 kg   LMP 04/04/2020 Comment: neg preg test  SpO2 96%   BMI 39.48 kg/m  Gen:   Awake, no distress   Resp:  Normal effort  MSK:   Moves extremities without difficulty  Other:  Diffuse lower lumbar tenderness.   Medical Decision Making  Medically screening exam initiated at 3:20 PM.  Appropriate orders placed.  04/06/2020 was informed that the remainder of the evaluation will be completed by another provider, this initial triage assessment does not replace that evaluation, and the importance of remaining in the ED until their evaluation is complete.  XR imaging and pain medication ordered.    [name], Katie Spencer 05/09/22 [id number]

## 2022-05-09 NOTE — ED Triage Notes (Signed)
Pt reports lower back pain that radiates down both legs. Denies loss of control of bowel or bladder. States she was getting in her vehicle today and felt her back go out.

## 2022-05-10 MED ORDER — METHYLPREDNISOLONE 4 MG PO TBPK: ORAL_TABLET | ORAL | 0 refills | Status: DC

## 2022-05-10 NOTE — ED Provider Notes (Signed)
  Physical Exam  BP 124/76 (BP Location: Left Arm)   Pulse 82   Temp (!) 97.5 F (36.4 C) (Oral)   Resp 18   Ht 5\' 4"  (1.626 m)   Wt 104.3 kg   LMP 04/04/2020 Comment: neg preg test  SpO2 94%   BMI 39.48 kg/m   Physical Exam Vitals and nursing note reviewed.  Constitutional:      General: She is not in acute distress.    Appearance: She is well-developed.  HENT:     Head: Normocephalic and atraumatic.  Eyes:     Conjunctiva/sclera: Conjunctivae normal.  Cardiovascular:     Rate and Rhythm: Normal rate and regular rhythm.     Heart sounds: No murmur heard. Pulmonary:     Effort: Pulmonary effort is normal. No respiratory distress.     Breath sounds: Normal breath sounds.  Abdominal:     Palpations: Abdomen is soft.     Tenderness: There is no abdominal tenderness.  Musculoskeletal:        General: No swelling.     Cervical back: Neck supple.  Skin:    General: Skin is warm and dry.     Capillary Refill: Capillary refill takes less than 2 seconds.  Neurological:     Mental Status: She is alert.  Psychiatric:        Mood and Affect: Mood normal.     Procedures  Procedures  ED Course / MDM    Medical Decision Making Amount and/or Complexity of Data Reviewed Labs: ordered. Radiology: ordered.  Risk Prescription drug management.   Patient received in handoff.  Acute on chronic back pain pending MRI.  Plan at signout is for discharge if MRI is without acute surgical pathology.  MRIs with small disc bulges in the T-spine but is otherwise unremarkable.  Patient then discharged with a Medrol Dosepak and outpatient follow-up.       04/06/2020, MD 05/10/22 [phone]

## 2022-05-14 ENCOUNTER — Emergency Department (HOSPITAL_BASED_OUTPATIENT_CLINIC_OR_DEPARTMENT_OTHER)
Admission: EM | Admit: 2022-05-14 | Discharge: 2022-05-14 | Disposition: A | Discharge status: Home / Self Care | Payer: Medicaid Other | Attending: Emergency Medicine | Admitting: Emergency Medicine

## 2022-05-14 ENCOUNTER — Encounter (HOSPITAL_BASED_OUTPATIENT_CLINIC_OR_DEPARTMENT_OTHER): Payer: Self-pay | Admitting: Emergency Medicine

## 2022-05-14 ENCOUNTER — Other Ambulatory Visit: Payer: Self-pay

## 2022-05-14 DIAGNOSIS — M545 Low back pain, unspecified: Secondary | ICD-10-CM

## 2022-05-14 DIAGNOSIS — F17210 Nicotine dependence, cigarettes, uncomplicated: Secondary | ICD-10-CM | POA: Insufficient documentation

## 2022-05-14 DIAGNOSIS — M549 Dorsalgia, unspecified: Secondary | ICD-10-CM | POA: Diagnosis present

## 2022-05-14 DIAGNOSIS — G8929 Other chronic pain: Secondary | ICD-10-CM | POA: Insufficient documentation

## 2022-05-14 HISTORY — DX: Other chronic pain: G89.29

## 2022-05-14 MED ORDER — METHYLPREDNISOLONE SODIUM SUCC 125 MG IJ SOLR
125.0000 mg | Freq: Once | INTRAMUSCULAR | Status: AC
  Administered 2022-05-14: 125 mg via INTRAMUSCULAR
  Filled 2022-05-14: qty 2

## 2022-05-14 MED ORDER — KETOROLAC TROMETHAMINE 30 MG/ML IJ SOLN
30.0000 mg | Freq: Once | INTRAMUSCULAR | Status: AC
  Administered 2022-05-14: 30 mg via INTRAMUSCULAR
  Filled 2022-05-14: qty 1

## 2022-05-14 NOTE — ED Provider Notes (Signed)
[facility] Provider Note: Katie Dell, MD, FACEP  CSN: [id number] MRN: [id number] ARRIVAL: 05/14/22 at 0253 ROOM: [facility]   CHIEF COMPLAINT  Back Pain   HISTORY OF PRESENT ILLNESS  05/14/22 5:18 AM Katie Spencer is a 41 y.o. female with a history of chronic pain (abdominal and lower back) with multiple visits to [facility] for the same.  She has had several dozen opioid prescriptions from multiple prescribers in the past 2 years.  He is here with mid low back pain that has worsened this week.  She has had 5 episodes, usually associated with sudden movements, that have exacerbated it.  She rates the pain as a 9 out of 10.  She is not having new numbness or weakness.  She is not having bowel or bladder changes.  She has been taking her gabapentin and trying heat.  She was recently prescribed a Medrol Dosepak but has not yet started taking it.    Past Medical History:  Diagnosis Date   Abnormal vaginal bleeding    Chronic abdominal pain    Chronic back pain    Renal disorder     Past Surgical History:  Procedure Laterality Date   ABDOMINAL HYSTERECTOMY     CESAREAN SECTION  2006, 2009, 2013, 2014   CHOLECYSTECTOMY      Family History  Adopted: Yes  Problem Relation Age of Onset   Cancer Mother    Cancer Other     Social History   Tobacco Use   Smoking status: Every Day    Packs/day: 1.00    Types: Cigarettes   Smokeless tobacco: Never  Vaping Use   Vaping Use: Some days   Substances: Nicotine  Substance Use Topics   Alcohol use: Not Currently   Drug use: Never    Prior to Admission medications   Medication Sig Start Date End Date Taking? Authorizing Provider  butalbital-acetaminophen-caffeine (FIORICET) 50-325-40 MG tablet Take 1 tablet by mouth 2 (two) times daily as needed for headache.    [provider]  citalopram (CELEXA) 40 MG tablet Take 40 mg by mouth daily. 01/01/20   [provider]  dicyclomine (BENTYL) 20 MG  tablet Take 1 tablet (20 mg total) by mouth every 8 (eight) hours as needed for spasms. 12/03/20   Petrucelli, Samantha R, PA-C  hydrOXYzine (ATARAX/VISTARIL) 25 MG tablet Take 25 mg by mouth 3 (three) times daily as needed for anxiety.  04/30/20   [provider]  lidocaine (LIDODERM) 5 % Place 1 patch onto the skin daily. Remove & Discard patch within 12 hours or as directed by MD 05/08/22   Sabas Sous, MD  lidocaine (XYLOCAINE) 2 % solution Use as directed 15 mLs in the mouth or throat every 6 (six) hours as needed for mouth pain. 03/03/21   Pricilla Loveless, MD  loperamide (IMODIUM) 2 MG capsule Take 1 capsule (2 mg total) by mouth 4 (four) times daily as needed for diarrhea or loose stools. 11/22/21   Redwine, Madison A, PA-C  methocarbamol (ROBAXIN) 500 MG tablet Take 1 tablet (500 mg total) by mouth every 8 (eight) hours as needed for muscle spasms. 05/08/22   Sabas Sous, MD  methylPREDNISolone (MEDROL DOSEPAK) 4 MG TBPK tablet Take as prescribed 05/10/22   Kommor, Madison, MD  metoCLOPramide (REGLAN) 10 MG tablet Take 1 tablet (10 mg total) by mouth 2 (two) times daily as needed for nausea. 11/22/21   Placido Sou, PA-C  MOBIC 15 MG tablet Take 15  mg by mouth daily as needed for pain. 10/05/20   [provider]  ondansetron (ZOFRAN ODT) 4 MG disintegrating tablet Take 1 tablet (4 mg total) by mouth every 8 (eight) hours as needed for nausea or vomiting. 12/03/20   Petrucelli, Samantha R, PA-C  pantoprazole (PROTONIX) 40 MG tablet Take 1 tablet (40 mg total) by mouth 2 (two) times daily. 11/22/21 12/22/21  Redwine, Madison A, PA-C  predniSONE (DELTASONE) 10 MG tablet Take 2 tablets (20 mg total) by mouth daily. 09/21/21   Theron Arista, PA-C  promethazine (PHENERGAN) 25 MG suppository Place 1 suppository (25 mg total) rectally every 8 (eight) hours as needed for nausea or vomiting. 11/22/21   Placido Sou, PA-C  sucralfate (CARAFATE) 1 GM/10ML suspension Take 10 mLs (1 g total) by  mouth 4 (four) times daily -  with meals and at bedtime. 03/03/21   Pricilla Loveless, MD  tamsulosin (FLOMAX) 0.4 MG CAPS capsule Take 1 capsule (0.4 mg total) by mouth daily. 10/24/20   Dartha Lodge, PA-C  VRAYLAR 6 MG CAPS Take 6 mg by mouth at bedtime.  01/02/20   [provider]  belladonna-opium (B&O SUPPRETTES) 16.2-30 MG suppository Place 1 suppository rectally every 8 (eight) hours as needed for pain. Patient not taking: Reported on 05/27/2020 05/20/20 08/18/20  Gilda Crease, MD  lansoprazole (PREVACID) 30 MG capsule Take 1 capsule daily while taking ibuprofen. Patient not taking: Reported on 05/27/2020 04/16/20 08/18/20  Velinda Wrobel, MD    Allergies Morphine and related, Almond (diagnostic), and Nsaids   REVIEW OF SYSTEMS  Negative except as noted here or in the History of Present Illness.   PHYSICAL EXAMINATION  Initial Vital Signs Blood pressure (!) 157/90, pulse 81, temperature 98.9 F (37.2 C), temperature source Oral, resp. rate 18, last menstrual period 04/04/2020, SpO2 100 %.  Examination General: Well-developed, well-nourished female in no acute distress; appearance consistent with age of record HENT: normocephalic; atraumatic Eyes: Normal appearance Neck: supple Heart: regular rate and rhythm Lungs: clear to auscultation bilaterally Abdomen: soft; nondistended; nontender; bowel sounds present Back: Mid lumbar tenderness Extremities: No deformity; full range of motion Neurologic: Awake, alert and oriented; motor function intact in all extremities and symmetric; no facial droop Skin: Warm and dry Psychiatric: Normal mood and affect   RESULTS  Summary of this visit's results, reviewed and interpreted by myself:   EKG Interpretation  Date/Time:    Ventricular Rate:    PR Interval:    QRS Duration:   QT Interval:    QTC Calculation:   R Axis:     Text Interpretation:         Laboratory Studies: No results found for this or any previous visit  (from the past 24 hour(s)). Imaging Studies: No results found.  ED COURSE and MDM  Nursing notes, initial and subsequent vitals signs, including pulse oximetry, reviewed and interpreted by myself.  Vitals:   05/14/22 0305  BP: (!) 157/90  Pulse: 81  Resp: 18  Temp: 98.9 F (37.2 C)  TempSrc: Oral  SpO2: 100%   Medications  ketorolac (TORADOL) 30 MG/ML injection 30 mg (has no administration in time range)  methylPREDNISolone sodium succinate (SOLU-MEDROL) 125 mg/2 mL injection 125 mg (has no administration in time range)   It was explained to the patient that because of the chronic nature of her back pain, as well as multiple narcotic prescriptions in the past 2 years, it is not appropriate for [place] to prescribe narcotics in the ED for  this.  She expresses her understanding and plan is to seek pain management.  She is requesting a shot of Toradol (she is known to tolerate this) as well as a shot of steroids.  She was advised to hold off starting the Medrol Dosepak for at least 24 hours after the steroid shot here.  PROCEDURES  Procedures   ED DIAGNOSES     ICD-10-CM   1. Acute exacerbation of chronic low back pain  M54.50    G89.29          Antwian Santaana, Jonny Ruiz, MD 05/14/22 [id number]

## 2022-05-14 NOTE — ED Triage Notes (Signed)
Pt reports "throwing her back out" while stretching in bed tonight. States she "tried everything" at home without relief. Mentions taking her Gabapentin and trying heat.

## 2022-06-03 ENCOUNTER — Encounter (HOSPITAL_BASED_OUTPATIENT_CLINIC_OR_DEPARTMENT_OTHER): Payer: Self-pay | Admitting: Emergency Medicine

## 2022-06-03 ENCOUNTER — Emergency Department (HOSPITAL_BASED_OUTPATIENT_CLINIC_OR_DEPARTMENT_OTHER)
Admission: EM | Admit: 2022-06-03 | Discharge: 2022-06-03 | Disposition: A | Discharge status: Home / Self Care | Payer: Medicaid Other | Attending: Emergency Medicine | Admitting: Emergency Medicine

## 2022-06-03 ENCOUNTER — Emergency Department (HOSPITAL_BASED_OUTPATIENT_CLINIC_OR_DEPARTMENT_OTHER): Payer: Medicaid Other

## 2022-06-03 ENCOUNTER — Other Ambulatory Visit: Payer: Self-pay

## 2022-06-03 DIAGNOSIS — R1032 Left lower quadrant pain: Secondary | ICD-10-CM | POA: Insufficient documentation

## 2022-06-03 DIAGNOSIS — R2242 Localized swelling, mass and lump, left lower limb: Secondary | ICD-10-CM | POA: Insufficient documentation

## 2022-06-03 DIAGNOSIS — R1909 Other intra-abdominal and pelvic swelling, mass and lump: Secondary | ICD-10-CM

## 2022-06-03 DIAGNOSIS — F17200 Nicotine dependence, unspecified, uncomplicated: Secondary | ICD-10-CM | POA: Diagnosis not present

## 2022-06-03 LAB — URINALYSIS, ROUTINE W REFLEX MICROSCOPIC
Bilirubin Urine: NEGATIVE
Glucose, UA: >=500 mg/dL — AB
Ketones, ur: NEGATIVE mg/dL
Leukocytes,Ua: NEGATIVE
Nitrite: NEGATIVE
Protein, ur: NEGATIVE mg/dL
Specific Gravity, Urine: 1.02 (ref 1.005–1.030)
pH: 7 (ref 5.0–8.0)

## 2022-06-03 LAB — BASIC METABOLIC PANEL
Anion gap: 7 (ref 5–15)
BUN: 11 mg/dL (ref 6–20)
CO2: 25 mmol/L (ref 22–32)
Calcium: 8.9 mg/dL (ref 8.9–10.3)
Chloride: 102 mmol/L (ref 98–111)
Creatinine, Ser: 0.66 mg/dL (ref 0.44–1.00)
GFR, Estimated: >60 mL/min (ref >60)
Glucose, Bld: 272 mg/dL — ABNORMAL HIGH (ref 70–99)
Potassium: 4 mmol/L (ref 3.5–5.1)
Sodium: 134 mmol/L — ABNORMAL LOW (ref 135–145)

## 2022-06-03 LAB — CBC WITH DIFFERENTIAL/PLATELET
Abs Immature Granulocytes: 0.04 10*3/uL (ref 0.00–0.07)
Basophils Absolute: 0.1 10*3/uL (ref 0.0–0.1)
Basophils Relative: 1 %
Eosinophils Absolute: 0.2 10*3/uL (ref 0.0–0.5)
Eosinophils Relative: 2 %
HCT: 36.4 % (ref 36.0–46.0)
Hemoglobin: 13.1 g/dL (ref 12.0–15.0)
Immature Granulocytes: 1 %
Lymphocytes Relative: 29 %
Lymphs Abs: 2.4 10*3/uL (ref 0.7–4.0)
MCH: 33 pg (ref 26.0–34.0)
MCHC: 36 g/dL (ref 30.0–36.0)
MCV: 91.7 fL (ref 80.0–100.0)
Monocytes Absolute: 0.6 10*3/uL (ref 0.1–1.0)
Monocytes Relative: 7 %
Neutro Abs: 5.2 10*3/uL (ref 1.7–7.7)
Neutrophils Relative %: 60 %
Platelets: 195 10*3/uL (ref 150–400)
RBC: 3.97 MIL/uL (ref 3.87–5.11)
RDW: 11.9 % (ref 11.5–15.5)
WBC: 8.5 10*3/uL (ref 4.0–10.5)
nRBC: 0 % (ref 0.0–0.2)

## 2022-06-03 LAB — WET PREP, GENITAL
Clue Cells Wet Prep HPF POC: NONE SEEN
Sperm: NONE SEEN
Trich, Wet Prep: NONE SEEN
WBC, Wet Prep HPF POC: NONE SEEN — AB (ref <10)
Yeast Wet Prep HPF POC: NONE SEEN

## 2022-06-03 LAB — URINALYSIS, MICROSCOPIC (REFLEX)

## 2022-06-03 MED ORDER — DOXYCYCLINE HYCLATE 100 MG PO CAPS: 100.0000 mg | ORAL_CAPSULE | Freq: Two times a day (BID) | ORAL | 0 refills | Status: DC

## 2022-06-03 MED ORDER — IOHEXOL 300 MG/ML  SOLN
100.0000 mL | Freq: Once | INTRAMUSCULAR | Status: AC | PRN
  Administered 2022-06-03: 100 mL via INTRAVENOUS

## 2022-06-03 MED ORDER — ONDANSETRON HCL 4 MG/2ML IJ SOLN
4.0000 mg | Freq: Once | INTRAMUSCULAR | Status: AC
  Administered 2022-06-03: 4 mg via INTRAVENOUS
  Filled 2022-06-03: qty 2

## 2022-06-03 MED ORDER — HYDROMORPHONE HCL 1 MG/ML IJ SOLN
1.0000 mg | Freq: Once | INTRAMUSCULAR | Status: AC
  Administered 2022-06-03: 1 mg via INTRAVENOUS
  Filled 2022-06-03: qty 1

## 2022-06-03 NOTE — ED Notes (Signed)
ED Provider at bedside. 

## 2022-06-03 NOTE — ED Provider Notes (Signed)
[place] [facility] DEPARTMENT Provider Note   CSN: [id number] Arrival date & time: 06/03/22  1523     History  Chief Complaint  Patient presents with   Groin Swelling    Katie Spencer is a 41 y.o. female.  Patient presenting with a complaint of tenderness and pain to the left groin area with swelling to the labia majora.  Patient's had no fall or injury.  Not associated with any fevers.  Nausea vomiting or diarrhea.  Patient's had a total abdominal hysterectomy.  Past medical history significant for chronic abdominal pain chronic back pain.  Past surgical history significant for abdominal hysterectomy and cholecystectomy.  Patient is an everyday smoker.       Home Medications Prior to Admission medications   Medication Sig Start Date End Date Taking? Authorizing Provider  butalbital-acetaminophen-caffeine (FIORICET) 50-325-40 MG tablet Take 1 tablet by mouth 2 (two) times daily as needed for headache.    [provider]  citalopram (CELEXA) 40 MG tablet Take 40 mg by mouth daily. 01/01/20   [provider]  dicyclomine (BENTYL) 20 MG tablet Take 1 tablet (20 mg total) by mouth every 8 (eight) hours as needed for spasms. 12/03/20   Petrucelli, Samantha R, PA-C  hydrOXYzine (ATARAX/VISTARIL) 25 MG tablet Take 25 mg by mouth 3 (three) times daily as needed for anxiety.  04/30/20   [provider]  lidocaine (LIDODERM) 5 % Place 1 patch onto the skin daily. Remove & Discard patch within 12 hours or as directed by MD 05/08/22   Maudie Flakes, MD  lidocaine (XYLOCAINE) 2 % solution Use as directed 15 mLs in the mouth or throat every 6 (six) hours as needed for mouth pain. 03/03/21   Sherwood Gambler, MD  loperamide (IMODIUM) 2 MG capsule Take 1 capsule (2 mg total) by mouth 4 (four) times daily as needed for diarrhea or loose stools. 11/22/21   Redwine, Madison A, PA-C  methocarbamol (ROBAXIN) 500 MG tablet Take 1 tablet (500 mg total) by mouth every 8  (eight) hours as needed for muscle spasms. 05/08/22   Maudie Flakes, MD  methylPREDNISolone (MEDROL DOSEPAK) 4 MG TBPK tablet Take as prescribed 05/10/22   Kommor, Madison, MD  metoCLOPramide (REGLAN) 10 MG tablet Take 1 tablet (10 mg total) by mouth 2 (two) times daily as needed for nausea. 11/22/21   Rayna Sexton, PA-C  MOBIC 15 MG tablet Take 15 mg by mouth daily as needed for pain. 10/05/20   [provider]  ondansetron (ZOFRAN ODT) 4 MG disintegrating tablet Take 1 tablet (4 mg total) by mouth every 8 (eight) hours as needed for nausea or vomiting. 12/03/20   Petrucelli, Samantha R, PA-C  pantoprazole (PROTONIX) 40 MG tablet Take 1 tablet (40 mg total) by mouth 2 (two) times daily. 11/22/21 12/22/21  Redwine, Madison A, PA-C  predniSONE (DELTASONE) 10 MG tablet Take 2 tablets (20 mg total) by mouth daily. 09/21/21   Sherrill Raring, PA-C  promethazine (PHENERGAN) 25 MG suppository Place 1 suppository (25 mg total) rectally every 8 (eight) hours as needed for nausea or vomiting. 11/22/21   Rayna Sexton, PA-C  sucralfate (CARAFATE) 1 GM/10ML suspension Take 10 mLs (1 g total) by mouth 4 (four) times daily -  with meals and at bedtime. 03/03/21   Sherwood Gambler, MD  tamsulosin (FLOMAX) 0.4 MG CAPS capsule Take 1 capsule (0.4 mg total) by mouth daily. 10/24/20   Jacqlyn Larsen, PA-C  VRAYLAR 6 MG CAPS Take 6 mg  by mouth at bedtime.  01/02/20   [provider]  belladonna-opium (B&O SUPPRETTES) 16.2-30 MG suppository Place 1 suppository rectally every 8 (eight) hours as needed for pain. Patient not taking: Reported on 05/27/2020 05/20/20 08/18/20  Gilda Crease, MD  lansoprazole (PREVACID) 30 MG capsule Take 1 capsule daily while taking ibuprofen. Patient not taking: Reported on 05/27/2020 04/16/20 08/18/20  Molpus, Jonny Ruiz, MD      Allergies    Morphine and related, Almond (diagnostic), and Nsaids    Review of Systems   Review of Systems  Constitutional:  Negative for chills and fever.   HENT:  Negative for ear pain and sore throat.   Eyes:  Negative for pain and visual disturbance.  Respiratory:  Negative for cough and shortness of breath.   Cardiovascular:  Negative for chest pain and palpitations.  Gastrointestinal:  Positive for abdominal pain. Negative for vomiting.  Genitourinary:  Positive for pelvic pain. Negative for dysuria and hematuria.  Musculoskeletal:  Negative for arthralgias and back pain.  Skin:  Negative for color change and rash.  Neurological:  Negative for seizures and syncope.  All other systems reviewed and are negative.   Physical Exam Updated Vital Signs BP 115/72   Pulse 90   Temp 98.5 F (36.9 C) (Oral)   Resp 16   Ht 1.626 m (5\' 4" )   Wt 106.6 kg   LMP 04/04/2020 Comment: neg preg test  SpO2 100%   BMI 40.34 kg/m  Physical Exam Vitals and nursing note reviewed.  Constitutional:      General: She is not in acute distress.    Appearance: Normal appearance. She is well-developed. She is not ill-appearing.  HENT:     Head: Normocephalic and atraumatic.  Eyes:     Conjunctiva/sclera: Conjunctivae normal.  Cardiovascular:     Rate and Rhythm: Normal rate and regular rhythm.     Heart sounds: No murmur heard. Pulmonary:     Effort: Pulmonary effort is normal. No respiratory distress.     Breath sounds: Normal breath sounds.  Abdominal:     Palpations: Abdomen is soft.     Tenderness: There is abdominal tenderness.     Comments: Tender left lower quadrant.  Genitourinary:    Vagina: No vaginal discharge.     Comments: Swelling to the left labia majora.  No swelling to the labia minora.  No evidence of a Bartholin gland cyst.  No vaginal discharge.  Wet prep performed.  Speculum did not show any evidence of a vaginal abscess or protrusion or erythema to the wall on the left side.  However digital exam pressing upward into the left vaginal wall area there was tenderness.  There is also tenderness to palpation kind of in the left mons  pubis area.  No erythema no induration no fluctuance. Musculoskeletal:        General: No swelling.     Cervical back: Neck supple.  Skin:    General: Skin is warm and dry.     Capillary Refill: Capillary refill takes less than 2 seconds.  Neurological:     Mental Status: She is alert.  Psychiatric:        Mood and Affect: Mood normal.     ED Results / Procedures / Treatments   Labs (all labs ordered are listed, but only abnormal results are displayed) Labs Reviewed  WET PREP, GENITAL - Abnormal; Notable for the following components:      Result Value   WBC, Wet Prep  HPF POC NONE SEEN (*)    All other components within normal limits  URINALYSIS, ROUTINE W REFLEX MICROSCOPIC - Abnormal; Notable for the following components:   APPearance HAZY (*)    Glucose, UA >=500 (*)    Hgb urine dipstick TRACE (*)    All other components within normal limits  URINALYSIS, MICROSCOPIC (REFLEX) - Abnormal; Notable for the following components:   Bacteria, UA MANY (*)    All other components within normal limits  BASIC METABOLIC PANEL - Abnormal; Notable for the following components:   Sodium 134 (*)    Glucose, Bld 272 (*)    All other components within normal limits  URINE CULTURE  CBC WITH DIFFERENTIAL/PLATELET    EKG None  Radiology CT Abdomen Pelvis W Contrast  Result Date: 06/03/2022 CLINICAL DATA:  Left lower quadrant abdominal pain. Concern for left lower quadrant pelvic abscess. EXAM: CT ABDOMEN AND PELVIS WITH CONTRAST TECHNIQUE: Multidetector CT imaging of the abdomen and pelvis was performed using the standard protocol following bolus administration of intravenous contrast. RADIATION DOSE REDUCTION: This exam was performed according to the departmental dose-optimization program which includes automated exposure control, adjustment of the mA and/or kV according to patient size and/or use of iterative reconstruction technique. CONTRAST:  100mL OMNIPAQUE IOHEXOL 300 MG/ML  SOLN  COMPARISON:  CT abdomen pelvis dated 03/29/2022. FINDINGS: Lower chest: No acute abnormality. Hepatobiliary: The liver is hypoattenuating, consistent with hepatic steatosis. No focal liver abnormality is seen. The patient is status post cholecystectomy. No biliary dilatation. Pancreas: Unremarkable. No pancreatic ductal dilatation or surrounding inflammatory changes. Spleen: Normal in size without focal abnormality. Adrenals/Urinary Tract: Adrenal glands are unremarkable. Kidneys are normal, without renal calculi, focal lesion, or hydronephrosis. Bladder is unremarkable. Stomach/Bowel: Stomach is within normal limits. Appendix appears normal. There is colonic diverticulosis without evidence of diverticulitis. There is submucosal fat of the ascending and transverse colon. No evidence of bowel wall thickening, distention, or inflammatory changes. Vascular/Lymphatic: No significant vascular findings are present. No enlarged abdominal or pelvic lymph nodes. Reproductive: A left adnexal hemorrhagic cyst appears similar to prior exam, measuring 4.4 x 3.5 x 3.5 cm in diameter. The patient is status post hysterectomy. Other: No abdominal wall hernia or abnormality. No abdominopelvic ascites. Musculoskeletal: No acute or significant osseous findings. IMPRESSION: 1. Submucosal fat of the ascending and transverse colon is nonspecific and may reflect chronic inflammatory bowel disease, obesity, or celiac disease. 2. Similar appearance of a left adnexal hemorrhagic cyst. 3. No evidence of abscess as clinically queried. Electronically Signed   By: Romona Curlsyler  Litton M.D.   On: 06/03/2022 20:07    Procedures Procedures    Medications Ordered in ED Medications  HYDROmorphone (DILAUDID) injection 1 mg (1 mg Intravenous Given 06/03/22 1916)  ondansetron (ZOFRAN) injection 4 mg (4 mg Intravenous Given 06/03/22 1916)  iohexol (OMNIPAQUE) 300 MG/ML solution 100 mL (100 mLs Intravenous Contrast Given 06/03/22 1941)    ED Course/  Medical Decision Making/ A&P                           Medical Decision Making Amount and/or Complexity of Data Reviewed Labs: ordered. Radiology: ordered.  Risk Prescription drug management.   Wet prep negative.  Urinalysis without any acute findings.  Other than many bacteria but there is also a lot of squamous epithelial cells.  Patient really does not have urinary tract and symptoms.  Clinically concerned for may be an infection in the left mons pubis  area but no evidence of any erythema or induration.  Certainly no fluctuance.  We will get CT scan abdomen pelvis just to make sure there is no pelvic abscess and to rule out diverticulitis with the pain being more on the left side.  Clinically also do not feel a hernia.  CBC and basic metabolic panel are pending.  We will go ahead and give pain medicine antinausea medicine.  Patient does have a history of chronic abdominal pain.  However she does have swelling to the left labia majora.  Work-up essentially negative.  Basic metabolic panel normal blood sugar up some at 272 will need close follow-up CBC normal no leukocytosis.  Wet prep negative urinalysis did have many bacteria sent for urine culture.  CT scan of the abdomen does not show anything to explain the swelling in the left groin area there is submucosal fat of the a sending and transverse colon there is nonspecific and may reflect chronic inflammatory bowel disease obesity or celiac disease.  But this is not anything on the left side a similar appearance of the left adnexal hemorrhagic cyst.  But I do not think this would cause labial swelling.  No evidence of abscess is clinically queried.  The soft tissue swelling not able to explain exactly no induration no erythema we will go ahead and try a course of antibiotics doxycycline for 7 days and have her follow-up with her doctor.  Chart review shows that patient is on chronic pain medication.   Final Clinical Impression(s) / ED  Diagnoses Final diagnoses:  None    Rx / DC Orders ED Discharge Orders     None         Vanetta Mulders, MD 06/03/22 2033

## 2022-06-03 NOTE — ED Triage Notes (Signed)
Pt POv recent drive to [place].  C/o groin pain x4 days, swelling since this AM. Denies discharge, denies STI contacts. Denies urinary sx.

## 2022-06-03 NOTE — ED Notes (Signed)
Late entry -- pt to and from CT dept via stretcher - remains awake and alert- no acute changes/distress noted upon return from CT; now awaits results.

## 2022-06-03 NOTE — ED Notes (Signed)
Patient transported to CT 

## 2022-06-03 NOTE — Discharge Instructions (Addendum)
CT scan is unable to explain the swelling.  We will go ahead give a trial of antibiotics doxycycline for 7 days.  Make an appointment follow-up with your doctor.  Take the pain medication that you have at home.  CT scan did show some inflammation of the right side of the large intestines and the transverse portion.  Does not clinically seem to be related to the swelling.  There is still a left adnexal cyst but that also appears to be not clinically connected.  Adnexa is the ovary area.  They feel that this adnexal cyst is related to status post hysterectomy.  And nothing of clinical concern urine did have some bacteria in it and urine culture was sent she will be notified if it grows significant amount of bacteria to be consistent with urinary tract infection.

## 2022-06-03 NOTE — ED Notes (Signed)
Pt awake and alert lying in bed- GCS 15.  RR even and unlabored on RA with symmetrical rise and fall of chest.  No c/o cp, sob. Abdomen round, soft -- TTP to L pelvic/L groin/L labia region with erythema and moderate edema to L labia.  Pt denies abnormal vaginal discharge or dysuria.  Reports ongoing pain to these areas 6-7/10 and is dull and aching with exception of wiping after urinary when pain intensifies to 10/10 and is sharp.  Pt agreeable for CT scan; denies any add'l needs, questions, concerns as pt awaits CT.  Will continue to monitor and maintain plan of care.

## 2022-06-03 NOTE — ED Notes (Signed)
Pt agreeable with d/c plan as discussed by provider- this nurse has verbally reinforced d/c instructions and provided pt with written copy - pt acknowledges verbal understanding and denies any add'l questions, concerns, needs- pt ambulatory independently with steady gait at d/c; vitals stable;  no distress

## 2022-06-05 LAB — URINE CULTURE

## 2022-07-15 ENCOUNTER — Emergency Department (HOSPITAL_COMMUNITY): Payer: Medicaid Other

## 2022-07-15 ENCOUNTER — Encounter (HOSPITAL_COMMUNITY): Payer: Self-pay

## 2022-07-15 ENCOUNTER — Emergency Department (HOSPITAL_COMMUNITY)
Admission: EM | Admit: 2022-07-15 | Discharge: 2022-07-15 | Disposition: A | Discharge status: Home / Self Care | Payer: Medicaid Other | Attending: Emergency Medicine | Admitting: Emergency Medicine

## 2022-07-15 DIAGNOSIS — R1032 Left lower quadrant pain: Secondary | ICD-10-CM | POA: Diagnosis present

## 2022-07-15 DIAGNOSIS — N9489 Other specified conditions associated with female genital organs and menstrual cycle: Secondary | ICD-10-CM | POA: Insufficient documentation

## 2022-07-15 LAB — CBC
HCT: 34.6 % — ABNORMAL LOW (ref 36.0–46.0)
Hemoglobin: 12.3 g/dL (ref 12.0–15.0)
MCH: 33.5 pg (ref 26.0–34.0)
MCHC: 35.5 g/dL (ref 30.0–36.0)
MCV: 94.3 fL (ref 80.0–100.0)
Platelets: 207 10*3/uL (ref 150–400)
RBC: 3.67 MIL/uL — ABNORMAL LOW (ref 3.87–5.11)
RDW: 11.8 % (ref 11.5–15.5)
WBC: 8.4 10*3/uL (ref 4.0–10.5)
nRBC: 0 % (ref 0.0–0.2)

## 2022-07-15 LAB — COMPREHENSIVE METABOLIC PANEL
ALT: 60 U/L — ABNORMAL HIGH (ref 0–44)
AST: 44 U/L — ABNORMAL HIGH (ref 15–41)
Albumin: 4.1 g/dL (ref 3.5–5.0)
Alkaline Phosphatase: 85 U/L (ref 38–126)
Anion gap: 9 (ref 5–15)
BUN: 11 mg/dL (ref 6–20)
CO2: 22 mmol/L (ref 22–32)
Calcium: 8.7 mg/dL — ABNORMAL LOW (ref 8.9–10.3)
Chloride: 102 mmol/L (ref 98–111)
Creatinine, Ser: 0.67 mg/dL (ref 0.44–1.00)
GFR, Estimated: >60 mL/min (ref >60)
Glucose, Bld: 154 mg/dL — ABNORMAL HIGH (ref 70–99)
Potassium: 3.3 mmol/L — ABNORMAL LOW (ref 3.5–5.1)
Sodium: 133 mmol/L — ABNORMAL LOW (ref 135–145)
Total Bilirubin: 0.6 mg/dL (ref 0.3–1.2)
Total Protein: 7.3 g/dL (ref 6.5–8.1)

## 2022-07-15 LAB — I-STAT BETA HCG BLOOD, ED (MC, WL, AP ONLY): I-stat hCG, quantitative: <5 m[IU]/mL (ref <5)

## 2022-07-15 LAB — URINALYSIS, ROUTINE W REFLEX MICROSCOPIC
Bilirubin Urine: NEGATIVE
Glucose, UA: NEGATIVE mg/dL
Hgb urine dipstick: NEGATIVE
Ketones, ur: NEGATIVE mg/dL
Leukocytes,Ua: NEGATIVE
Nitrite: NEGATIVE
Protein, ur: NEGATIVE mg/dL
Specific Gravity, Urine: 1.012 (ref 1.005–1.030)
pH: 6 (ref 5.0–8.0)

## 2022-07-15 LAB — LIPASE, BLOOD: Lipase: 32 U/L (ref 11–51)

## 2022-07-15 MED ORDER — HYDROCODONE-ACETAMINOPHEN 5-325 MG PO TABS
2.0000 | ORAL_TABLET | Freq: Once | ORAL | Status: AC
  Administered 2022-07-15: 2 via ORAL
  Filled 2022-07-15: qty 2

## 2022-07-15 MED ORDER — IBUPROFEN 800 MG PO TABS: 800.0000 mg | ORAL_TABLET | Freq: Three times a day (TID) | ORAL | 0 refills | Status: DC

## 2022-07-15 MED ORDER — KETOROLAC TROMETHAMINE 15 MG/ML IJ SOLN
30.0000 mg | Freq: Once | INTRAMUSCULAR | Status: AC
  Administered 2022-07-15: 30 mg via INTRAMUSCULAR
  Filled 2022-07-15: qty 2

## 2022-07-15 NOTE — ED Triage Notes (Signed)
Pt states that she has 2 cyst on her L ovary that are getting larger and more painful, has appt with OB on the 8th

## 2022-07-15 NOTE — ED Provider Notes (Signed)
[facility]-EMERGENCY DEPT Provider Note   CSN: [id number] Arrival date & time: 07/15/22  0001     History  Chief Complaint  Patient presents with   Abdominal Pain    Katie Spencer is a 41 y.o. female.  41 year old female with a history of chronic back pain, chronic abdominal pain presents to the emergency department for left lower quadrant pain described as a "squeezing and twisting" pain.  This has been ongoing for at least 1 month.  The patient was diagnosed with a left ovarian cyst which, she reports, is growing in size.  She is scheduled to follow-up with her OB/GYN on 07/26/2022.  Was previously controlling discomfort with 5/325 Norco tablets prescribed by pain management, but she ran out of this medication yesterday.  She has not been able to pick up her new prescription as she is waiting on insurance authorization.  She denies fever, nausea, vomiting, vaginal bleeding, vaginal discharge.  The history is provided by the patient. No language interpreter was used.  Abdominal Pain      Home Medications Prior to Admission medications   Medication Sig Start Date End Date Taking? Authorizing Provider  ABILIFY MAINTENA 400 MG SRER injection Inject 400 mg into the muscle every 28 (twenty-eight) days. 06/18/22  Yes [provider]  butalbital-acetaminophen-caffeine (FIORICET) 50-325-40 MG tablet Take 1 tablet by mouth 2 (two) times daily as needed for headache.   Yes [provider]  citalopram (CELEXA) 40 MG tablet Take 40 mg by mouth daily. 01/01/20  Yes [provider]  cyclobenzaprine (FLEXERIL) 10 MG tablet Take 10 mg by mouth 2 (two) times daily as needed for muscle spasms. 05/25/22  Yes [provider]  gabapentin (NEURONTIN) 600 MG tablet Take 600 mg by mouth 3 (three) times daily. 05/19/22  Yes [provider]  HYDROcodone-acetaminophen (NORCO/VICODIN) 5-325 MG tablet Take 1 tablet by mouth 3 (three) times daily as  needed for moderate pain or severe pain. 07/09/22  Yes [provider]  hydrOXYzine (ATARAX/VISTARIL) 25 MG tablet Take 25 mg by mouth 3 (three) times daily as needed for anxiety.  04/30/20  Yes [provider]  ibuprofen (ADVIL) 800 MG tablet Take 1 tablet (800 mg total) by mouth 3 (three) times daily. 07/15/22  Yes Antony Madura, PA-C  loperamide (IMODIUM) 2 MG capsule Take 1 capsule (2 mg total) by mouth 4 (four) times daily as needed for diarrhea or loose stools. 11/22/21  Yes Redwine, Madison A, PA-C  methocarbamol (ROBAXIN) 500 MG tablet Take 1 tablet (500 mg total) by mouth every 8 (eight) hours as needed for muscle spasms. 05/08/22  Yes Sabas Sous, MD  SUMAtriptan (IMITREX) 100 MG tablet Take 100 mg by mouth every 2 (two) hours as needed for migraine. 02/09/22  Yes [provider]  dicyclomine (BENTYL) 20 MG tablet Take 1 tablet (20 mg total) by mouth every 8 (eight) hours as needed for spasms. Patient not taking: Reported on 07/15/2022 12/03/20   Petrucelli, Pleas Koch, PA-C  doxycycline (VIBRAMYCIN) 100 MG capsule Take 1 capsule (100 mg total) by mouth 2 (two) times daily. Patient not taking: Reported on 07/15/2022 06/03/22   Vanetta Mulders, MD  lidocaine (LIDODERM) 5 % Place 1 patch onto the skin daily. Remove & Discard patch within 12 hours or as directed by MD Patient not taking: Reported on 07/15/2022 05/08/22   Sabas Sous, MD  lidocaine (XYLOCAINE) 2 % solution Use as directed 15 mLs in the mouth or throat every 6 (  six) hours as needed for mouth pain. Patient not taking: Reported on 07/15/2022 03/03/21   Pricilla LovelessGoldston, Scott, MD  metFORMIN (GLUCOPHAGE-XR) 500 MG 24 hr tablet Take 500 mg by mouth daily. 06/22/22   [provider]  methylPREDNISolone (MEDROL DOSEPAK) 4 MG TBPK tablet Take as prescribed Patient not taking: Reported on 07/15/2022 05/10/22   Kommor, Wyn ForsterMadison, MD  metoCLOPramide (REGLAN) 10 MG tablet Take 1 tablet (10 mg total) by mouth 2 (two)  times daily as needed for nausea. Patient not taking: Reported on 07/15/2022 11/22/21   Placido SouJoldersma, Logan, PA-C  omeprazole (PRILOSEC) 40 MG capsule Take 40 mg by mouth daily as needed (indigestion). 02/15/22   [provider]  ondansetron (ZOFRAN ODT) 4 MG disintegrating tablet Take 1 tablet (4 mg total) by mouth every 8 (eight) hours as needed for nausea or vomiting. Patient not taking: Reported on 07/15/2022 12/03/20   Petrucelli, Pleas KochSamantha R, PA-C  pantoprazole (PROTONIX) 40 MG tablet Take 1 tablet (40 mg total) by mouth 2 (two) times daily. 11/22/21 12/22/21  Redwine, Madison A, PA-C  predniSONE (DELTASONE) 10 MG tablet Take 2 tablets (20 mg total) by mouth daily. Patient not taking: Reported on 07/15/2022 09/21/21   Theron AristaSage, Haley, PA-C  promethazine (PHENERGAN) 25 MG suppository Place 1 suppository (25 mg total) rectally every 8 (eight) hours as needed for nausea or vomiting. Patient not taking: Reported on 07/15/2022 11/22/21   Placido SouJoldersma, Logan, PA-C  sucralfate (CARAFATE) 1 GM/10ML suspension Take 10 mLs (1 g total) by mouth 4 (four) times daily -  with meals and at bedtime. Patient not taking: Reported on 07/15/2022 03/03/21   Pricilla LovelessGoldston, Scott, MD  tamsulosin (FLOMAX) 0.4 MG CAPS capsule Take 1 capsule (0.4 mg total) by mouth daily. Patient not taking: Reported on 07/15/2022 10/24/20   Dartha LodgeFord, Kelsey N, PA-C  belladonna-opium (B&O SUPPRETTES) 16.2-30 MG suppository Place 1 suppository rectally every 8 (eight) hours as needed for pain. Patient not taking: Reported on 05/27/2020 05/20/20 08/18/20  Gilda CreasePollina, Christopher J, MD  lansoprazole (PREVACID) 30 MG capsule Take 1 capsule daily while taking ibuprofen. Patient not taking: Reported on 05/27/2020 04/16/20 08/18/20  Molpus, Jonny RuizJohn, MD      Allergies    Morphine and related, Almond (diagnostic), and Nsaids    Review of Systems   Review of Systems  Gastrointestinal:  Positive for abdominal pain.  Ten systems reviewed and are negative for acute change, except  as noted in the HPI.    Physical Exam Updated Vital Signs BP 124/74   Pulse 74   Temp 98.6 F (37 C) (Oral)   Resp 18   LMP 04/04/2020 Comment: neg preg test  SpO2 95%   Physical Exam Vitals and nursing note reviewed.  Constitutional:      General: She is not in acute distress.    Appearance: She is well-developed. She is not diaphoretic.     Comments: Appears uncomfortable, but nontoxic.  HENT:     Head: Normocephalic and atraumatic.  Eyes:     General: No scleral icterus.    Conjunctiva/sclera: Conjunctivae normal.  Cardiovascular:     Rate and Rhythm: Normal rate and regular rhythm.     Pulses: Normal pulses.  Pulmonary:     Effort: Pulmonary effort is normal. No respiratory distress.     Comments: Respirations even and unlabored Abdominal:     General: There is no distension.  Musculoskeletal:        General: Normal range of motion.     Cervical back: Normal range  of motion.  Skin:    General: Skin is warm and dry.     Coloration: Skin is not pale.     Findings: No erythema or rash.  Neurological:     Mental Status: She is alert and oriented to person, place, and time.  Psychiatric:        Behavior: Behavior normal.     ED Results / Procedures / Treatments   Labs (all labs ordered are listed, but only abnormal results are displayed) Labs Reviewed  COMPREHENSIVE METABOLIC PANEL - Abnormal; Notable for the following components:      Result Value   Sodium 133 (*)    Potassium 3.3 (*)    Glucose, Bld 154 (*)    Calcium 8.7 (*)    AST 44 (*)    ALT 60 (*)    All other components within normal limits  CBC - Abnormal; Notable for the following components:   RBC 3.67 (*)    HCT 34.6 (*)    All other components within normal limits  URINALYSIS, ROUTINE W REFLEX MICROSCOPIC - Abnormal; Notable for the following components:   APPearance HAZY (*)    All other components within normal limits  LIPASE, BLOOD  I-STAT BETA HCG BLOOD, ED ([facility], [facility], [facility] ONLY)     EKG None  Radiology US ABDOMINAL PELVIC ART/VENT FLOW DOPPLER  Result Date: 07/15/2022 CLINICAL DATA:  Left ovarian cyst EXAM: TRANSABDOMINAL ULTRASOUND OF PELVIS DOPPLER ULTRASOUND OF OVARIES TECHNIQUE: Transabdominal ultrasound examination of the pelvis was performed including evaluation of the uterus, ovaries, adnexal regions, and pelvic cul-de-sac. Color and duplex Doppler ultrasound was utilized to evaluate blood flow to the ovaries. COMPARISON:  07/11/2022, 06/28/2022 FINDINGS: Uterus Absent Endometrium Not applicable Right ovary Not visualized Left ovary Measurements: 3.6 x 3.1 x 3.2 cm = volume: 18 mL. There is again seen a mildly complex cyst within the left ovary measuring 3.2 x 2.3 x 2.8 cm demonstrating a small internal daughter cyst. This is stable since prior examination but appears decreased in overall size since remote prior examination of 06/28/2022 and is most in keeping with a resolving follicular cyst., Pulsed Doppler evaluation demonstrates normal low-resistance arterial and venous waveforms in the visualized left ovary. Other: No free fluid within the cul-de-sac. IMPRESSION: Mildly complex left ovarian cyst, stable since prior examination of 07/11/2022 and decreased since 06/28/2022, most in keeping with a resolving follicular cyst. Electronically Signed   By: Helyn Numbers M.D.   On: 07/15/2022 04:10   US PELVIS (TRANSABDOMINAL ONLY)  Result Date: 07/15/2022 CLINICAL DATA:  Left ovarian cyst EXAM: TRANSABDOMINAL ULTRASOUND OF PELVIS DOPPLER ULTRASOUND OF OVARIES TECHNIQUE: Transabdominal ultrasound examination of the pelvis was performed including evaluation of the uterus, ovaries, adnexal regions, and pelvic cul-de-sac. Color and duplex Doppler ultrasound was utilized to evaluate blood flow to the ovaries. COMPARISON:  07/11/2022, 06/28/2022 FINDINGS: Uterus Absent Endometrium Not applicable Right ovary Not visualized Left ovary Measurements: 3.6 x 3.1 x 3.2 cm = volume: 18  mL. There is again seen a mildly complex cyst within the left ovary measuring 3.2 x 2.3 x 2.8 cm demonstrating a small internal daughter cyst. This is stable since prior examination but appears decreased in overall size since remote prior examination of 06/28/2022 and is most in keeping with a resolving follicular cyst., Pulsed Doppler evaluation demonstrates normal low-resistance arterial and venous waveforms in the visualized left ovary. Other: No free fluid within the cul-de-sac. IMPRESSION: Mildly complex left ovarian cyst, stable since prior examination of 07/11/2022  and decreased since 06/28/2022, most in keeping with a resolving follicular cyst. Electronically Signed   By: Helyn Numbers M.D.   On: 07/15/2022 04:10    Procedures Procedures    Medications Ordered in ED Medications  ketorolac (TORADOL) 15 MG/ML injection 30 mg (30 mg Intramuscular Given 07/15/22 0318)  HYDROcodone-acetaminophen (NORCO/VICODIN) 5-325 MG per tablet 2 tablet (2 tablets Oral Given 07/15/22 6269)    ED Course/ Medical Decision Making/ A&P                           Medical Decision Making Amount and/or Complexity of Data Reviewed Labs: ordered. Radiology: ordered.  Risk Prescription drug management.   This patient presents to the ED for concern of LLQ pain, this involves an extensive number of treatment options, and is a complaint that carries with it a high risk of complications and morbidity.  The differential diagnosis includes ovarian cyst vs ovarian torsion vs diverticulitis vs kidney stone vs MSK   Co morbidities that complicate the patient evaluation  Chronic pain Obesity    Additional history obtained:  External records from outside source obtained and reviewed including CT abdomen/pelvis from 07/12/22 which shows L ovarian cyst   Lab Tests:  I Ordered, and personally interpreted labs.  The pertinent results include:  Normal WBC count, mild hypokalemia of 3.3, mild transaminitis wit AST  of 44, ALT of 60 (stable compared to 7 months ago). UA normal. Upreg negative.   Imaging Studies ordered:  I ordered imaging studies including pelvic US  I independently visualized and interpreted imaging which showed improving L ovarian cyst. No evidence of ovarian torsion. I agree with the radiologist interpretation   Cardiac Monitoring:  The patient was maintained on a cardiac monitor.  I personally viewed and interpreted the cardiac monitored which showed an underlying rhythm of: NSR   Medicines ordered and prescription drug management:  I ordered medication including Norco and Toradol for pain  Reevaluation of the patient after these medicines showed that the patient improved I have reviewed the patients home medicines and have made adjustments as needed   Test Considered:  Wet prep GC chlamydia   Problem List / ED Course:  History of chronic abdominal pain, out of chronic Norco prescription.  She is followed by pain management. Last presented 4 days ago for similar complaints at outside hospital.  Had CT scan which showed no acute process and stable left ovarian cyst. Ultrasound obtained today which shows no evidence of ovarian torsion.  Laboratory work-up at baseline. Pain has improved on repeat assessment following oral and IM pain meds. Given established pain contract, will discharge with 800 mg ibuprofen tablets.   Reevaluation:  After the interventions noted above, I reevaluated the patient and found that they have :improved   Social Determinants of Health:  Insured patient   Dispostion:  After consideration of the diagnostic results and the patients response to treatment, I feel that the patent would benefit from OBGYN follow up.  Encouraged patient to contact her provider to try and move up her existing appointment.  Return precautions discussed and provided. Patient discharged in stable condition with no unaddressed concerns.         Final  Clinical Impression(s) / ED Diagnoses Final diagnoses:  Left lower quadrant abdominal pain    Rx / DC Orders ED Discharge Orders          Ordered    ibuprofen (ADVIL) 800 MG tablet  3 times daily        07/15/22 0459              Antonietta Breach, PA-C 07/15/22 0537    Shanon Rosser, MD 07/15/22 [phone]

## 2022-07-15 NOTE — ED Notes (Signed)
Labeled urine specimen and culture sent to lab. [facility] 

## 2022-08-21 ENCOUNTER — Encounter (HOSPITAL_BASED_OUTPATIENT_CLINIC_OR_DEPARTMENT_OTHER): Payer: Self-pay | Admitting: Urology

## 2022-08-21 ENCOUNTER — Other Ambulatory Visit: Payer: Self-pay

## 2022-08-21 ENCOUNTER — Emergency Department (HOSPITAL_BASED_OUTPATIENT_CLINIC_OR_DEPARTMENT_OTHER)
Admission: EM | Admit: 2022-08-21 | Discharge: 2022-08-22 | Disposition: A | Discharge status: Home / Self Care | Payer: Medicaid Other | Attending: Emergency Medicine | Admitting: Emergency Medicine

## 2022-08-21 DIAGNOSIS — G8929 Other chronic pain: Secondary | ICD-10-CM | POA: Diagnosis not present

## 2022-08-21 DIAGNOSIS — R7309 Other abnormal glucose: Secondary | ICD-10-CM | POA: Diagnosis not present

## 2022-08-21 DIAGNOSIS — E876 Hypokalemia: Secondary | ICD-10-CM | POA: Insufficient documentation

## 2022-08-21 DIAGNOSIS — M545 Low back pain, unspecified: Secondary | ICD-10-CM | POA: Insufficient documentation

## 2022-08-21 DIAGNOSIS — M549 Dorsalgia, unspecified: Secondary | ICD-10-CM | POA: Diagnosis present

## 2022-08-21 LAB — CBC WITH DIFFERENTIAL/PLATELET
Abs Immature Granulocytes: 0.04 10*3/uL (ref 0.00–0.07)
Basophils Absolute: 0.1 10*3/uL (ref 0.0–0.1)
Basophils Relative: 1 %
Eosinophils Absolute: 0.2 10*3/uL (ref 0.0–0.5)
Eosinophils Relative: 2 %
HCT: 36.3 % (ref 36.0–46.0)
Hemoglobin: 13.3 g/dL (ref 12.0–15.0)
Immature Granulocytes: 0 %
Lymphocytes Relative: 33 %
Lymphs Abs: 3.9 10*3/uL (ref 0.7–4.0)
MCH: 32.9 pg (ref 26.0–34.0)
MCHC: 36.6 g/dL — ABNORMAL HIGH (ref 30.0–36.0)
MCV: 89.9 fL (ref 80.0–100.0)
Monocytes Absolute: 0.8 10*3/uL (ref 0.1–1.0)
Monocytes Relative: 7 %
Neutro Abs: 6.7 10*3/uL (ref 1.7–7.7)
Neutrophils Relative %: 57 %
Platelets: 242 10*3/uL (ref 150–400)
RBC: 4.04 MIL/uL (ref 3.87–5.11)
RDW: 11.2 % — ABNORMAL LOW (ref 11.5–15.5)
WBC: 11.7 10*3/uL — ABNORMAL HIGH (ref 4.0–10.5)
nRBC: 0 % (ref 0.0–0.2)

## 2022-08-21 LAB — BASIC METABOLIC PANEL
Anion gap: 9 (ref 5–15)
BUN: 8 mg/dL (ref 6–20)
CO2: 22 mmol/L (ref 22–32)
Calcium: 9.2 mg/dL (ref 8.9–10.3)
Chloride: 105 mmol/L (ref 98–111)
Creatinine, Ser: 0.71 mg/dL (ref 0.44–1.00)
GFR, Estimated: >60 mL/min (ref >60)
Glucose, Bld: 132 mg/dL — ABNORMAL HIGH (ref 70–99)
Potassium: 3.2 mmol/L — ABNORMAL LOW (ref 3.5–5.1)
Sodium: 136 mmol/L (ref 135–145)

## 2022-08-21 LAB — URINALYSIS, ROUTINE W REFLEX MICROSCOPIC
Bilirubin Urine: NEGATIVE
Glucose, UA: 250 mg/dL — AB
Ketones, ur: NEGATIVE mg/dL
Nitrite: NEGATIVE
Protein, ur: NEGATIVE mg/dL
Specific Gravity, Urine: 1.02 (ref 1.005–1.030)
pH: 7 (ref 5.0–8.0)

## 2022-08-21 LAB — PREGNANCY, URINE: Preg Test, Ur: NEGATIVE

## 2022-08-21 LAB — URINALYSIS, MICROSCOPIC (REFLEX)

## 2022-08-21 MED ORDER — CYCLOBENZAPRINE HCL 5 MG PO TABS
5.0000 mg | ORAL_TABLET | Freq: Once | ORAL | Status: AC
  Administered 2022-08-21: 5 mg via ORAL
  Filled 2022-08-21: qty 1

## 2022-08-21 MED ORDER — POTASSIUM CHLORIDE CRYS ER 20 MEQ PO TBCR: 20.0000 meq | EXTENDED_RELEASE_TABLET | Freq: Two times a day (BID) | ORAL | 0 refills | Status: DC

## 2022-08-21 MED ORDER — ACETAMINOPHEN 325 MG PO TABS
650.0000 mg | ORAL_TABLET | Freq: Once | ORAL | Status: AC
  Administered 2022-08-21: 650 mg via ORAL
  Filled 2022-08-21: qty 2

## 2022-08-21 MED ORDER — KETOROLAC TROMETHAMINE 30 MG/ML IJ SOLN
30.0000 mg | Freq: Once | INTRAMUSCULAR | Status: AC
  Administered 2022-08-21: 30 mg via INTRAVENOUS
  Filled 2022-08-21 (×2): qty 1

## 2022-08-21 MED ORDER — POTASSIUM CHLORIDE CRYS ER 20 MEQ PO TBCR
40.0000 meq | EXTENDED_RELEASE_TABLET | Freq: Once | ORAL | Status: AC
  Administered 2022-08-21: 40 meq via ORAL
  Filled 2022-08-21: qty 2

## 2022-08-21 MED ORDER — HYDROMORPHONE HCL 1 MG/ML IJ SOLN
1.0000 mg | Freq: Once | INTRAMUSCULAR | Status: AC
  Administered 2022-08-21: 1 mg via INTRAVENOUS
  Filled 2022-08-21: qty 1

## 2022-08-21 MED ORDER — LIDOCAINE 5 % EX PTCH
1.0000 | MEDICATED_PATCH | CUTANEOUS | Status: DC
  Administered 2022-08-21: 1 via TRANSDERMAL
  Filled 2022-08-21: qty 1

## 2022-08-21 NOTE — Discharge Instructions (Signed)
Be aggressive with your medication for back pain until your narcotic pain medication is delivered.  Your medication should include cyclobenzaprine (Flexeril), acetaminophen (Tylenol) and a nonsteroidal anti-inflammatory agent such as ketorolac (Toradol) or naproxen (Aleve).  Follow-up with your pain management physician.

## 2022-08-21 NOTE — ED Triage Notes (Signed)
Pt states lower back pain since last Monday and loss of bladder control x 3 times today   In pain management for chronic pain

## 2022-08-21 NOTE — ED Provider Notes (Incomplete)
[facility] EMERGENCY DEPARTMENT Provider Note   CSN: [id number] Arrival date & time: 08/21/22  1911     History {Add pertinent medical, surgical, social history, OB history to HPI:1} Chief Complaint  Patient presents with  . Back Pain    Katie Spencer is a 41 y.o. female.  The history is provided by the patient.  Back Pain She has history of chronic back pain and states that she had run out of her hydrocodone tablets yesterday and there is a problem with having the mail order prescription coming through.  She has not had any of her narcotic pain medication today and has noted increasing pain across the lower back.  On several occasions, she had urinary incontinence, but this was because she was unable to get to the bathroom in time to urinate.  She denies any weakness, numbness, tingling.  Pain did get worse after she did a workout at the gym today.   Home Medications Prior to Admission medications   Medication Sig Start Date End Date Taking? Authorizing Provider  ABILIFY MAINTENA 400 MG SRER injection Inject 400 mg into the muscle every 28 (twenty-eight) days. 06/18/22   [provider]  butalbital-acetaminophen-caffeine (FIORICET) 50-325-40 MG tablet Take 1 tablet by mouth 2 (two) times daily as needed for headache.    [provider]  citalopram (CELEXA) 40 MG tablet Take 40 mg by mouth daily. 01/01/20   [provider]  cyclobenzaprine (FLEXERIL) 10 MG tablet Take 10 mg by mouth 2 (two) times daily as needed for muscle spasms. 05/25/22   [provider]  dicyclomine (BENTYL) 20 MG tablet Take 1 tablet (20 mg total) by mouth every 8 (eight) hours as needed for spasms. Patient not taking: Reported on 07/15/2022 12/03/20   Petrucelli, Pleas Koch, PA-C  doxycycline (VIBRAMYCIN) 100 MG capsule Take 1 capsule (100 mg total) by mouth 2 (two) times daily. Patient not taking: Reported on 07/15/2022 06/03/22   Vanetta Mulders, MD  gabapentin  (NEURONTIN) 600 MG tablet Take 600 mg by mouth 3 (three) times daily. 05/19/22   [provider]  HYDROcodone-acetaminophen (NORCO/VICODIN) 5-325 MG tablet Take 1 tablet by mouth 3 (three) times daily as needed for moderate pain or severe pain. 07/09/22   [provider]  hydrOXYzine (ATARAX/VISTARIL) 25 MG tablet Take 25 mg by mouth 3 (three) times daily as needed for anxiety.  04/30/20   [provider]  ibuprofen (ADVIL) 800 MG tablet Take 1 tablet (800 mg total) by mouth 3 (three) times daily. 07/15/22   Antony Madura, PA-C  lidocaine (LIDODERM) 5 % Place 1 patch onto the skin daily. Remove & Discard patch within 12 hours or as directed by MD Patient not taking: Reported on 07/15/2022 05/08/22   Sabas Sous, MD  lidocaine (XYLOCAINE) 2 % solution Use as directed 15 mLs in the mouth or throat every 6 (six) hours as needed for mouth pain. Patient not taking: Reported on 07/15/2022 03/03/21   Pricilla Loveless, MD  loperamide (IMODIUM) 2 MG capsule Take 1 capsule (2 mg total) by mouth 4 (four) times daily as needed for diarrhea or loose stools. 11/22/21   Redwine, Madison A, PA-C  metFORMIN (GLUCOPHAGE-XR) 500 MG 24 hr tablet Take 500 mg by mouth daily. 06/22/22   [provider]  methocarbamol (ROBAXIN) 500 MG tablet Take 1 tablet (500 mg total) by mouth every 8 (eight) hours as needed for muscle spasms. 05/08/22   Sabas Sous, MD  methylPREDNISolone (MEDROL DOSEPAK) 4  MG TBPK tablet Take as prescribed Patient not taking: Reported on 07/15/2022 05/10/22   Kommor, Wyn Forster, MD  metoCLOPramide (REGLAN) 10 MG tablet Take 1 tablet (10 mg total) by mouth 2 (two) times daily as needed for nausea. Patient not taking: Reported on 07/15/2022 11/22/21   Placido Sou, PA-C  omeprazole (PRILOSEC) 40 MG capsule Take 40 mg by mouth daily as needed (indigestion). 02/15/22   [provider]  ondansetron (ZOFRAN ODT) 4 MG disintegrating tablet Take 1 tablet (4 mg total) by  mouth every 8 (eight) hours as needed for nausea or vomiting. Patient not taking: Reported on 07/15/2022 12/03/20   Petrucelli, Pleas Koch, PA-C  pantoprazole (PROTONIX) 40 MG tablet Take 1 tablet (40 mg total) by mouth 2 (two) times daily. 11/22/21 12/22/21  Redwine, Madison A, PA-C  predniSONE (DELTASONE) 10 MG tablet Take 2 tablets (20 mg total) by mouth daily. Patient not taking: Reported on 07/15/2022 09/21/21   Theron Arista, PA-C  promethazine (PHENERGAN) 25 MG suppository Place 1 suppository (25 mg total) rectally every 8 (eight) hours as needed for nausea or vomiting. Patient not taking: Reported on 07/15/2022 11/22/21   Placido Sou, PA-C  sucralfate (CARAFATE) 1 GM/10ML suspension Take 10 mLs (1 g total) by mouth 4 (four) times daily -  with meals and at bedtime. Patient not taking: Reported on 07/15/2022 03/03/21   Pricilla Loveless, MD  SUMAtriptan (IMITREX) 100 MG tablet Take 100 mg by mouth every 2 (two) hours as needed for migraine. 02/09/22   [provider]  tamsulosin (FLOMAX) 0.4 MG CAPS capsule Take 1 capsule (0.4 mg total) by mouth daily. Patient not taking: Reported on 07/15/2022 10/24/20   Dartha Lodge, PA-C  belladonna-opium (B&O SUPPRETTES) 16.2-30 MG suppository Place 1 suppository rectally every 8 (eight) hours as needed for pain. Patient not taking: Reported on 05/27/2020 05/20/20 08/18/20  Gilda Crease, MD  lansoprazole (PREVACID) 30 MG capsule Take 1 capsule daily while taking ibuprofen. Patient not taking: Reported on 05/27/2020 04/16/20 08/18/20  Molpus, Jonny Ruiz, MD      Allergies    Morphine and related, Almond (diagnostic), and Nsaids    Review of Systems   Review of Systems  Musculoskeletal:  Positive for back pain.  All other systems reviewed and are negative.   Physical Exam Updated Vital Signs BP 139/80   Pulse 91   Temp 98.2 F (36.8 C) (Oral)   Resp 17   Ht 5\' 4"  (1.626 m)   Wt 106.6 kg   LMP 04/04/2020 Comment: neg preg test  SpO2 100%   BMI  40.34 kg/m  Physical Exam Vitals and nursing note reviewed.   41 year old female, resting comfortably and in no acute distress. Vital signs are normal. Oxygen saturation is 100%, which is normal. Head is normocephalic and atraumatic. PERRLA, EOMI. Oropharynx is clear. Neck is nontender and supple without adenopathy or JVD. Back is markedly tender across the lower lumbar area.  Straight leg raise is positive at 15 degrees bilaterally. Lungs are clear without rales, wheezes, or rhonchi. Chest is nontender. Heart has regular rate and rhythm without murmur. Abdomen is soft, flat, nontender. Extremities have no cyanosis or edema, full range of motion is present. Skin is warm and dry without rash. Neurologic: Mental status is normal, cranial nerves are intact, strength is 5/5 in all 4 extremities.  Sensation in the legs is intact.  ED Results / Procedures / Treatments   Labs (all labs ordered are listed, but only abnormal results are  displayed) Labs Reviewed  CBC WITH DIFFERENTIAL/PLATELET - Abnormal; Notable for the following components:      Result Value   WBC 11.7 (*)    MCHC 36.6 (*)    RDW 11.2 (*)    All other components within normal limits  BASIC METABOLIC PANEL - Abnormal; Notable for the following components:   Potassium 3.2 (*)    Glucose, Bld 132 (*)    All other components within normal limits  URINALYSIS, ROUTINE W REFLEX MICROSCOPIC - Abnormal; Notable for the following components:   APPearance CLOUDY (*)    Glucose, UA 250 (*)    Hgb urine dipstick TRACE (*)    Leukocytes,Ua TRACE (*)    All other components within normal limits  URINALYSIS, MICROSCOPIC (REFLEX) - Abnormal; Notable for the following components:   Bacteria, UA FEW (*)    All other components within normal limits  PREGNANCY, URINE  SEDIMENTATION RATE  C-REACTIVE PROTEIN   Procedures Procedures  {Document cardiac monitor, telemetry assessment procedure when appropriate:1}  Medications Ordered in  ED Medications  lidocaine (LIDODERM) 5 % 1 patch (1 patch Transdermal Patch Applied 08/21/22 2236)  potassium chloride SA (KLOR-CON M) CR tablet 40 mEq (has no administration in time range)  HYDROmorphone (DILAUDID) injection 1 mg (has no administration in time range)  ketorolac (TORADOL) 30 MG/ML injection 30 mg (has no administration in time range)  cyclobenzaprine (FLEXERIL) tablet 5 mg (has no administration in time range)  acetaminophen (TYLENOL) tablet 650 mg (has no administration in time range)  cyclobenzaprine (FLEXERIL) tablet 5 mg (5 mg Oral Given 08/21/22 2236)    ED Course/ Medical Decision Making/ A&P                           Medical Decision Making Risk OTC drugs. Prescription drug management.   Exacerbation of low back pain mainly secondary to running out of her narcotic pain medication.  Incontinence was urge incontinence and not the type of incontinence that comes from spinal cord or cauda equina injury.  I have reviewed her past records, and she had an MRI of the lumbar spine on 05/09/2022 which showed no evidence of spinal stenosis or neural foraminal stenosis.  As the patient is neurologically intact today, she does not need repeat imaging.  I have reviewed and interpreted labs which were ordered at triage, and my interpretation is hypokalemia which had been present previously, mild leukocytosis which is nonspecific, elevated random glucose level with slight glycosuria.  I discussed this with patient, her primary care provider is monitoring her glucose levels and last A1c level was normal.  I have ordered oral cyclobenzaprine, intravenous ketorolac, intravenous hydromorphone, oral acetaminophen.  She will need to follow-up with her pain management physician.  I am discharging her with a prescription for oral potassium.  She was given a dose of oral potassium in the emergency department.  Final Clinical Impression(s) / ED Diagnoses Final diagnoses:  None    Rx / DC  Orders ED Discharge Orders     None

## 2022-08-21 NOTE — ED Provider Notes (Addendum)
[facility] EMERGENCY DEPARTMENT Provider Note   CSN: [id number] Arrival date & time: 08/21/22  1911     History  Chief Complaint  Patient presents with   Back Pain    Katie Spencer is a 41 y.o. female.  The history is provided by the patient.  Back Pain She has history of chronic back pain and states that she had run out of her hydrocodone tablets yesterday and there is a problem with having the mail order prescription coming through.  She has not had any of her narcotic pain medication today and has noted increasing pain across the lower back.  On several occasions, she had urinary incontinence, but this was because she was unable to get to the bathroom in time to urinate.  She denies any weakness, numbness, tingling.  Pain did get worse after she did a workout at the gym today.   Home Medications Prior to Admission medications   Medication Sig Start Date End Date Taking? Authorizing Provider  ABILIFY MAINTENA 400 MG SRER injection Inject 400 mg into the muscle every 28 (twenty-eight) days. 06/18/22   [provider]  butalbital-acetaminophen-caffeine (FIORICET) 50-325-40 MG tablet Take 1 tablet by mouth 2 (two) times daily as needed for headache.    [provider]  citalopram (CELEXA) 40 MG tablet Take 40 mg by mouth daily. 01/01/20   [provider]  cyclobenzaprine (FLEXERIL) 10 MG tablet Take 10 mg by mouth 2 (two) times daily as needed for muscle spasms. 05/25/22   [provider]  dicyclomine (BENTYL) 20 MG tablet Take 1 tablet (20 mg total) by mouth every 8 (eight) hours as needed for spasms. Patient not taking: Reported on 07/15/2022 12/03/20   Petrucelli, Pleas Koch, PA-C  doxycycline (VIBRAMYCIN) 100 MG capsule Take 1 capsule (100 mg total) by mouth 2 (two) times daily. Patient not taking: Reported on 07/15/2022 06/03/22   Vanetta Mulders, MD  gabapentin (NEURONTIN) 600 MG tablet Take 600 mg by mouth 3 (three) times daily.  05/19/22   [provider]  HYDROcodone-acetaminophen (NORCO/VICODIN) 5-325 MG tablet Take 1 tablet by mouth 3 (three) times daily as needed for moderate pain or severe pain. 07/09/22   [provider]  hydrOXYzine (ATARAX/VISTARIL) 25 MG tablet Take 25 mg by mouth 3 (three) times daily as needed for anxiety.  04/30/20   [provider]  ibuprofen (ADVIL) 800 MG tablet Take 1 tablet (800 mg total) by mouth 3 (three) times daily. 07/15/22   Antony Madura, PA-C  lidocaine (LIDODERM) 5 % Place 1 patch onto the skin daily. Remove & Discard patch within 12 hours or as directed by MD Patient not taking: Reported on 07/15/2022 05/08/22   Sabas Sous, MD  lidocaine (XYLOCAINE) 2 % solution Use as directed 15 mLs in the mouth or throat every 6 (six) hours as needed for mouth pain. Patient not taking: Reported on 07/15/2022 03/03/21   Pricilla Loveless, MD  loperamide (IMODIUM) 2 MG capsule Take 1 capsule (2 mg total) by mouth 4 (four) times daily as needed for diarrhea or loose stools. 11/22/21   Redwine, Madison A, PA-C  metFORMIN (GLUCOPHAGE-XR) 500 MG 24 hr tablet Take 500 mg by mouth daily. 06/22/22   [provider]  methocarbamol (ROBAXIN) 500 MG tablet Take 1 tablet (500 mg total) by mouth every 8 (eight) hours as needed for muscle spasms. 05/08/22   Sabas Sous, MD  methylPREDNISolone (MEDROL DOSEPAK) 4 MG TBPK tablet Take as prescribed Patient not taking:  Reported on 07/15/2022 05/10/22   Kommor, Wyn Forster, MD  metoCLOPramide (REGLAN) 10 MG tablet Take 1 tablet (10 mg total) by mouth 2 (two) times daily as needed for nausea. Patient not taking: Reported on 07/15/2022 11/22/21   Placido Sou, PA-C  omeprazole (PRILOSEC) 40 MG capsule Take 40 mg by mouth daily as needed (indigestion). 02/15/22   [provider]  ondansetron (ZOFRAN ODT) 4 MG disintegrating tablet Take 1 tablet (4 mg total) by mouth every 8 (eight) hours as needed for nausea or vomiting. Patient  not taking: Reported on 07/15/2022 12/03/20   Petrucelli, Pleas Koch, PA-C  pantoprazole (PROTONIX) 40 MG tablet Take 1 tablet (40 mg total) by mouth 2 (two) times daily. 11/22/21 12/22/21  Redwine, Madison A, PA-C  predniSONE (DELTASONE) 10 MG tablet Take 2 tablets (20 mg total) by mouth daily. Patient not taking: Reported on 07/15/2022 09/21/21   Theron Arista, PA-C  promethazine (PHENERGAN) 25 MG suppository Place 1 suppository (25 mg total) rectally every 8 (eight) hours as needed for nausea or vomiting. Patient not taking: Reported on 07/15/2022 11/22/21   Placido Sou, PA-C  sucralfate (CARAFATE) 1 GM/10ML suspension Take 10 mLs (1 g total) by mouth 4 (four) times daily -  with meals and at bedtime. Patient not taking: Reported on 07/15/2022 03/03/21   Pricilla Loveless, MD  SUMAtriptan (IMITREX) 100 MG tablet Take 100 mg by mouth every 2 (two) hours as needed for migraine. 02/09/22   [provider]  tamsulosin (FLOMAX) 0.4 MG CAPS capsule Take 1 capsule (0.4 mg total) by mouth daily. Patient not taking: Reported on 07/15/2022 10/24/20   Dartha Lodge, PA-C  belladonna-opium (B&O SUPPRETTES) 16.2-30 MG suppository Place 1 suppository rectally every 8 (eight) hours as needed for pain. Patient not taking: Reported on 05/27/2020 05/20/20 08/18/20  Gilda Crease, MD  lansoprazole (PREVACID) 30 MG capsule Take 1 capsule daily while taking ibuprofen. Patient not taking: Reported on 05/27/2020 04/16/20 08/18/20  Molpus, Jonny Ruiz, MD      Allergies    Morphine and related, Almond (diagnostic), and Nsaids    Review of Systems   Review of Systems  Musculoskeletal:  Positive for back pain.  All other systems reviewed and are negative.   Physical Exam Updated Vital Signs BP 139/80   Pulse 91   Temp 98.2 F (36.8 C) (Oral)   Resp 17   Ht 5\' 4"  (1.626 m)   Wt 106.6 kg   LMP 04/04/2020 Comment: neg preg test  SpO2 100%   BMI 40.34 kg/m  Physical Exam Vitals and nursing note reviewed.   41  year old female, resting comfortably and in no acute distress. Vital signs are normal. Oxygen saturation is 100%, which is normal. Head is normocephalic and atraumatic. PERRLA, EOMI. Oropharynx is clear. Neck is nontender and supple without adenopathy or JVD. Back is markedly tender across the lower lumbar area.  Straight leg raise is positive at 15 degrees bilaterally. Lungs are clear without rales, wheezes, or rhonchi. Chest is nontender. Heart has regular rate and rhythm without murmur. Abdomen is soft, flat, nontender. Extremities have no cyanosis or edema, full range of motion is present. Skin is warm and dry without rash. Neurologic: Mental status is normal, cranial nerves are intact, strength is 5/5 in all 4 extremities.  Sensation in the legs is intact.  ED Results / Procedures / Treatments   Labs (all labs ordered are listed, but only abnormal results are displayed) Labs Reviewed  CBC WITH DIFFERENTIAL/PLATELET - Abnormal;  Notable for the following components:      Result Value   WBC 11.7 (*)    MCHC 36.6 (*)    RDW 11.2 (*)    All other components within normal limits  BASIC METABOLIC PANEL - Abnormal; Notable for the following components:   Potassium 3.2 (*)    Glucose, Bld 132 (*)    All other components within normal limits  URINALYSIS, ROUTINE W REFLEX MICROSCOPIC - Abnormal; Notable for the following components:   APPearance CLOUDY (*)    Glucose, UA 250 (*)    Hgb urine dipstick TRACE (*)    Leukocytes,Ua TRACE (*)    All other components within normal limits  URINALYSIS, MICROSCOPIC (REFLEX) - Abnormal; Notable for the following components:   Bacteria, UA FEW (*)    All other components within normal limits  PREGNANCY, URINE  SEDIMENTATION RATE  C-REACTIVE PROTEIN   Procedures Procedures    Medications Ordered in ED Medications  lidocaine (LIDODERM) 5 % 1 patch (1 patch Transdermal Patch Applied 08/21/22 2236)  potassium chloride SA (KLOR-CON M) CR tablet  40 mEq (has no administration in time range)  HYDROmorphone (DILAUDID) injection 1 mg (has no administration in time range)  ketorolac (TORADOL) 30 MG/ML injection 30 mg (has no administration in time range)  cyclobenzaprine (FLEXERIL) tablet 5 mg (has no administration in time range)  acetaminophen (TYLENOL) tablet 650 mg (has no administration in time range)  cyclobenzaprine (FLEXERIL) tablet 5 mg (5 mg Oral Given 08/21/22 2236)    ED Course/ Medical Decision Making/ A&P                           Medical Decision Making Risk OTC drugs. Prescription drug management.   Exacerbation of low back pain mainly secondary to running out of her narcotic pain medication.  Incontinence was urge incontinence and not the type of incontinence that comes from spinal cord or cauda equina injury.  I have reviewed her past records, and she had an MRI of the lumbar spine on 05/09/2022 which showed no evidence of spinal stenosis or neural foraminal stenosis.  As the patient is neurologically intact today, she does not need repeat imaging.  I have reviewed and interpreted labs which were ordered at triage, and my interpretation is hypokalemia which had been present previously, mild leukocytosis which is nonspecific, elevated random glucose level with slight glycosuria.  I discussed this with patient, her primary care provider is monitoring her glucose levels and last A1c level was normal.  I have ordered oral cyclobenzaprine, intravenous ketorolac, intravenous hydromorphone, oral acetaminophen.  She will need to follow-up with her pain management physician.  I am discharging her with a prescription for oral potassium.  She was given a dose of oral potassium in the emergency department.  She feels much better following above-noted treatment and is discharged.  Final Clinical Impression(s) / ED Diagnoses Final diagnoses:  Chronic bilateral low back pain without sciatica  Hypokalemia  Elevated random blood glucose  level    Rx / DC Orders ED Discharge Orders          Ordered    potassium chloride SA (KLOR-CON M) 20 MEQ tablet  2 times daily        08/21/22 2338              Dione Booze, MD 08/22/22 Glena [place]    Dione Booze, MD 08/22/22 [phone]

## 2022-08-22 LAB — SEDIMENTATION RATE: Sed Rate: 16 mm/hr (ref 0–22)

## 2022-08-22 LAB — C-REACTIVE PROTEIN: CRP: 2.1 mg/dL — ABNORMAL HIGH (ref <1.0)

## 2022-09-29 ENCOUNTER — Encounter (HOSPITAL_BASED_OUTPATIENT_CLINIC_OR_DEPARTMENT_OTHER): Payer: Self-pay | Admitting: Emergency Medicine

## 2022-09-29 ENCOUNTER — Other Ambulatory Visit: Payer: Self-pay

## 2022-09-29 ENCOUNTER — Emergency Department (HOSPITAL_BASED_OUTPATIENT_CLINIC_OR_DEPARTMENT_OTHER)
Admission: EM | Admit: 2022-09-29 | Discharge: 2022-09-30 | Disposition: A | Discharge status: Home / Self Care | Payer: Medicaid Other | Attending: Emergency Medicine | Admitting: Emergency Medicine

## 2022-09-29 DIAGNOSIS — U071 COVID-19: Secondary | ICD-10-CM | POA: Diagnosis not present

## 2022-09-29 DIAGNOSIS — F17290 Nicotine dependence, other tobacco product, uncomplicated: Secondary | ICD-10-CM | POA: Insufficient documentation

## 2022-09-29 DIAGNOSIS — R438 Other disturbances of smell and taste: Secondary | ICD-10-CM | POA: Diagnosis present

## 2022-09-29 MED ORDER — ALBUTEROL SULFATE HFA 108 (90 BASE) MCG/ACT IN AERS
1.0000 | INHALATION_SPRAY | Freq: Once | RESPIRATORY_TRACT | Status: AC
  Administered 2022-09-30: 1 via RESPIRATORY_TRACT
  Filled 2022-09-29: qty 6.7

## 2022-09-29 NOTE — ED Provider Notes (Signed)
[place] EMERGENCY DEPARTMENT Provider Note   CSN: [id number] Arrival date & time: 09/29/22  2001     History  Chief Complaint  Patient presents with   Cough    Katie Spencer is a 42 y.o. female.  41 y.o female with a PMH of bronchitis presents to the ED with a chief complaint of loss of taste and for the past 2 days.  Patient was evaluated by her PCP on Wednesday, diagnosed with bronchitis, placed on a short course of antibiotics along with started on some steroids and given an inhaler.  She reports she has felt fine since, however today when she tried to eat her dinner she could not taste her food therefore she took a COVID test.  Patient had 2 positive COVID test at home, however they were from last year when she thought that they were likely expired.  She otherwise does not have any complaints.  She does endorse tobacco use daily.  No underlying history of asthma or COPD. NO chest pain or shortness of breath.   The history is provided by the patient.  Cough Cough characteristics:  Dry Sputum characteristics:  Nondescript Severity:  Moderate Onset quality:  Gradual Associated symptoms: myalgias   Associated symptoms: no chest pain, no chills and no fever        Home Medications Prior to Admission medications   Medication Sig Start Date End Date Taking? Authorizing Provider  ABILIFY MAINTENA 400 MG SRER injection Inject 400 mg into the muscle every 28 (twenty-eight) days. 06/18/22   [provider]  butalbital-acetaminophen-caffeine (FIORICET) 50-325-40 MG tablet Take 1 tablet by mouth 2 (two) times daily as needed for headache.    [provider]  citalopram (CELEXA) 40 MG tablet Take 40 mg by mouth daily. 01/01/20   [provider]  cyclobenzaprine (FLEXERIL) 10 MG tablet Take 10 mg by mouth 2 (two) times daily as needed for muscle spasms. 05/25/22   [provider]  dicyclomine (BENTYL) 20 MG tablet Take 1 tablet (20 mg  total) by mouth every 8 (eight) hours as needed for spasms. Patient not taking: Reported on 07/15/2022 12/03/20   Petrucelli, Glynda Jaeger, PA-C  doxycycline (VIBRAMYCIN) 100 MG capsule Take 1 capsule (100 mg total) by mouth 2 (two) times daily. Patient not taking: Reported on 07/15/2022 06/03/22   Fredia Sorrow, MD  gabapentin (NEURONTIN) 600 MG tablet Take 600 mg by mouth 3 (three) times daily. 05/19/22   [provider]  HYDROcodone-acetaminophen (NORCO/VICODIN) 5-325 MG tablet Take 1 tablet by mouth 3 (three) times daily as needed for moderate pain or severe pain. 07/09/22   [provider]  hydrOXYzine (ATARAX/VISTARIL) 25 MG tablet Take 25 mg by mouth 3 (three) times daily as needed for anxiety.  04/30/20   [provider]  ibuprofen (ADVIL) 800 MG tablet Take 1 tablet (800 mg total) by mouth 3 (three) times daily. 07/15/22   Antonietta Breach, PA-C  lidocaine (LIDODERM) 5 % Place 1 patch onto the skin daily. Remove & Discard patch within 12 hours or as directed by MD Patient not taking: Reported on 07/15/2022 05/08/22   Maudie Flakes, MD  lidocaine (XYLOCAINE) 2 % solution Use as directed 15 mLs in the mouth or throat every 6 (six) hours as needed for mouth pain. Patient not taking: Reported on 07/15/2022 03/03/21   Sherwood Gambler, MD  loperamide (IMODIUM) 2 MG capsule Take 1 capsule (2 mg total) by mouth 4 (four) times daily as needed for diarrhea  or loose stools. 11/22/21   Redwine, Madison A, PA-C  metFORMIN (GLUCOPHAGE-XR) 500 MG 24 hr tablet Take 500 mg by mouth daily. 06/22/22   [provider]  methocarbamol (ROBAXIN) 500 MG tablet Take 1 tablet (500 mg total) by mouth every 8 (eight) hours as needed for muscle spasms. 05/08/22   Maudie Flakes, MD  methylPREDNISolone (MEDROL DOSEPAK) 4 MG TBPK tablet Take as prescribed Patient not taking: Reported on 07/15/2022 05/10/22   Kommor, Debe Coder, MD  metoCLOPramide (REGLAN) 10 MG tablet Take 1 tablet (10 mg total) by  mouth 2 (two) times daily as needed for nausea. Patient not taking: Reported on 07/15/2022 11/22/21   Rayna Sexton, PA-C  omeprazole (PRILOSEC) 40 MG capsule Take 40 mg by mouth daily as needed (indigestion). 02/15/22   [provider]  ondansetron (ZOFRAN ODT) 4 MG disintegrating tablet Take 1 tablet (4 mg total) by mouth every 8 (eight) hours as needed for nausea or vomiting. Patient not taking: Reported on 07/15/2022 12/03/20   Petrucelli, Glynda Jaeger, PA-C  pantoprazole (PROTONIX) 40 MG tablet Take 1 tablet (40 mg total) by mouth 2 (two) times daily. 11/22/21 12/22/21  Redwine, Madison A, PA-C  potassium chloride SA (KLOR-CON M) 20 MEQ tablet Take 1 tablet (20 mEq total) by mouth 2 (two) times daily. 40/9/81   Delora Fuel, MD  predniSONE (DELTASONE) 10 MG tablet Take 2 tablets (20 mg total) by mouth daily. Patient not taking: Reported on 07/15/2022 09/21/21   Sherrill Raring, PA-C  promethazine (PHENERGAN) 25 MG suppository Place 1 suppository (25 mg total) rectally every 8 (eight) hours as needed for nausea or vomiting. Patient not taking: Reported on 07/15/2022 11/22/21   Rayna Sexton, PA-C  sucralfate (CARAFATE) 1 GM/10ML suspension Take 10 mLs (1 g total) by mouth 4 (four) times daily -  with meals and at bedtime. Patient not taking: Reported on 07/15/2022 03/03/21   Sherwood Gambler, MD  SUMAtriptan (IMITREX) 100 MG tablet Take 100 mg by mouth every 2 (two) hours as needed for migraine. 02/09/22   [provider]  tamsulosin (FLOMAX) 0.4 MG CAPS capsule Take 1 capsule (0.4 mg total) by mouth daily. Patient not taking: Reported on 07/15/2022 10/24/20   Jacqlyn Larsen, PA-C  belladonna-opium (B&O SUPPRETTES) 16.2-30 MG suppository Place 1 suppository rectally every 8 (eight) hours as needed for pain. Patient not taking: Reported on 05/27/2020 05/20/20 08/18/20  Orpah Greek, MD  lansoprazole (PREVACID) 30 MG capsule Take 1 capsule daily while taking ibuprofen. Patient not taking:  Reported on 05/27/2020 04/16/20 08/18/20  Molpus, Jenny Reichmann, MD      Allergies    Morphine and related, Almond (diagnostic), and Nsaids    Review of Systems   Review of Systems  Constitutional:  Negative for chills and fever.  Respiratory:  Positive for cough.   Cardiovascular:  Negative for chest pain.  Gastrointestinal:  Negative for abdominal pain, nausea and vomiting.  Genitourinary:  Negative for flank pain.  Musculoskeletal:  Positive for myalgias.    Physical Exam Updated Vital Signs BP 107/61 (BP Location: Right Arm)   Pulse (!) 101   Temp 98.6 F (37 C) (Oral)   Resp 20   Ht 5\' 4"  (1.626 m)   Wt 102.1 kg   LMP 04/04/2020 Comment: neg preg test  SpO2 99%   BMI 38.62 kg/m  Physical Exam Vitals and nursing note reviewed.  Constitutional:      Appearance: Normal appearance.  HENT:     Head: Normocephalic and atraumatic.  Mouth/Throat:     Mouth: Mucous membranes are moist.  Cardiovascular:     Rate and Rhythm: Normal rate.  Pulmonary:     Effort: Pulmonary effort is normal.     Breath sounds: Wheezing present.     Comments:  Wheezing throughout, decreased throughout.  Abdominal:     General: Abdomen is flat.  Musculoskeletal:     Cervical back: Normal range of motion and neck supple.  Skin:    General: Skin is warm and dry.  Neurological:     Mental Status: She is alert and oriented to person, place, and time.     ED Results / Procedures / Treatments   Labs (all labs ordered are listed, but only abnormal results are displayed) Labs Reviewed - No data to display  EKG None  Radiology No results found.  Procedures Procedures    Medications Ordered in ED Medications  albuterol (VENTOLIN HFA) 108 (90 Base) MCG/ACT inhaler 1 puff (1 puff Inhalation Given 09/30/22 0013)    ED Course/ Medical Decision Making/ A&P                             Medical Decision Making Risk Prescription drug management.   Patient presents to the ED with a chief  complaint of loss of taste,loss of smell x today.  Diagnosed with bronchitis on Wednesday, prescribed steroids, given abx, along with inhaler but she has not been able to get this daily refill.  On evaluation she is wheezing throughout, no hypoxia, no tachycardia.  She does report tobacco use at baseline.  He is currently taking her antibiotics along with her steroids.  She does report taking a COVID-19 test at home x 2 which were both positive.  She is requesting retest at this time, however I do suspect that test were likely accurate.  Seeing as she has lost taste and smell I do suspect that she likely has a COVID-19 infection.  She is not hypoxic, nor tachycardic so I do not suspect further infection.  She does not report any chest pain, no shortness of breath although she does have a history of ongoing tobacco use.  She is currently on steroids treating her wheezing.  We discussed appropriate follow-up with primary care physician.  She was given albuterol inhaler while in the ED.  Patient is hemodynamically stable for discharge.    Portions of this note were generated with Scientist, clinical (histocompatibility and immunogenetics). Dictation errors may occur despite best attempts at proofreading.   Final Clinical Impression(s) / ED Diagnoses Final diagnoses:  COVID-19 virus infection    Rx / DC Orders ED Discharge Orders     None         Claude Manges, PA-C 09/30/22 0016    Tilden Fossa, MD 09/30/22 [phone]

## 2022-09-29 NOTE — Discharge Instructions (Signed)
You will need to continue taking your steroids to help with your symptoms.   You will need to use your inhaler as needed for your shortness of breath.

## 2022-09-29 NOTE — ED Notes (Signed)
Pt states she had 2 positive home covid tests this evening and when she ate dinner she could not taste or smell

## 2022-09-29 NOTE — ED Triage Notes (Signed)
Patient reports tested + for COVID at home x2. Patient was seen at urgent care for same prior to testing for COVID and was diagnosed with bronchitis and was given antibiotics and steriods. Patient states tonight at dinner she could not taste her food and tested for COVID.

## 2022-09-30 NOTE — ED Notes (Signed)
Written and verbal inst to pt  Verbalized an understanding  To home  

## 2022-10-02 ENCOUNTER — Emergency Department (HOSPITAL_BASED_OUTPATIENT_CLINIC_OR_DEPARTMENT_OTHER)
Admission: EM | Admit: 2022-10-02 | Discharge: 2022-10-02 | Disposition: A | Discharge status: Home / Self Care | Payer: Medicaid Other | Attending: Emergency Medicine | Admitting: Emergency Medicine

## 2022-10-02 ENCOUNTER — Emergency Department (HOSPITAL_BASED_OUTPATIENT_CLINIC_OR_DEPARTMENT_OTHER): Payer: Medicaid Other

## 2022-10-02 ENCOUNTER — Other Ambulatory Visit: Payer: Self-pay

## 2022-10-02 ENCOUNTER — Encounter (HOSPITAL_BASED_OUTPATIENT_CLINIC_OR_DEPARTMENT_OTHER): Payer: Self-pay | Admitting: Emergency Medicine

## 2022-10-02 DIAGNOSIS — E876 Hypokalemia: Secondary | ICD-10-CM | POA: Diagnosis not present

## 2022-10-02 DIAGNOSIS — R0602 Shortness of breath: Secondary | ICD-10-CM | POA: Diagnosis present

## 2022-10-02 DIAGNOSIS — U071 COVID-19: Secondary | ICD-10-CM | POA: Insufficient documentation

## 2022-10-02 LAB — BASIC METABOLIC PANEL
Anion gap: 12 (ref 5–15)
BUN: 11 mg/dL (ref 6–20)
CO2: 18 mmol/L — ABNORMAL LOW (ref 22–32)
Calcium: 8.7 mg/dL — ABNORMAL LOW (ref 8.9–10.3)
Chloride: 102 mmol/L (ref 98–111)
Creatinine, Ser: 0.79 mg/dL (ref 0.44–1.00)
GFR, Estimated: >60 mL/min (ref >60)
Glucose, Bld: 158 mg/dL — ABNORMAL HIGH (ref 70–99)
Potassium: 3.2 mmol/L — ABNORMAL LOW (ref 3.5–5.1)
Sodium: 132 mmol/L — ABNORMAL LOW (ref 135–145)

## 2022-10-02 LAB — CBC WITH DIFFERENTIAL/PLATELET
Abs Immature Granulocytes: 0.02 10*3/uL (ref 0.00–0.07)
Basophils Absolute: 0 10*3/uL (ref 0.0–0.1)
Basophils Relative: 1 %
Eosinophils Absolute: 0 10*3/uL (ref 0.0–0.5)
Eosinophils Relative: 0 %
HCT: 37.2 % (ref 36.0–46.0)
Hemoglobin: 13.2 g/dL (ref 12.0–15.0)
Immature Granulocytes: 0 %
Lymphocytes Relative: 50 %
Lymphs Abs: 2.7 10*3/uL (ref 0.7–4.0)
MCH: 32.2 pg (ref 26.0–34.0)
MCHC: 35.5 g/dL (ref 30.0–36.0)
MCV: 90.7 fL (ref 80.0–100.0)
Monocytes Absolute: 0.5 10*3/uL (ref 0.1–1.0)
Monocytes Relative: 8 %
Neutro Abs: 2.2 10*3/uL (ref 1.7–7.7)
Neutrophils Relative %: 41 %
Platelets: 177 10*3/uL (ref 150–400)
RBC: 4.1 MIL/uL (ref 3.87–5.11)
RDW: 12.3 % (ref 11.5–15.5)
WBC: 5.5 10*3/uL (ref 4.0–10.5)
nRBC: 0 % (ref 0.0–0.2)

## 2022-10-02 MED ORDER — IPRATROPIUM-ALBUTEROL 0.5-2.5 (3) MG/3ML IN SOLN
3.0000 mL | Freq: Once | RESPIRATORY_TRACT | Status: AC
  Administered 2022-10-02: 3 mL via RESPIRATORY_TRACT
  Filled 2022-10-02: qty 3

## 2022-10-02 MED ORDER — ALBUTEROL SULFATE (2.5 MG/3ML) 0.083% IN NEBU
INHALATION_SOLUTION | RESPIRATORY_TRACT | Status: AC
  Administered 2022-10-02: 5 mg
  Filled 2022-10-02: qty 6

## 2022-10-02 MED ORDER — PAXLOVID (300/100) 20 X 150 MG & 10 X 100MG PO TBPK: 3.0000 | ORAL_TABLET | Freq: Two times a day (BID) | ORAL | 0 refills | Status: AC

## 2022-10-02 NOTE — ED Notes (Signed)
Discharge instructions reviewed with patient. Patient verbalizes understanding, no further questions at this time. Medications/prescriptions and follow up information provided. No acute distress noted at time of departure.  

## 2022-10-02 NOTE — ED Notes (Signed)
ED Provider at bedside. 

## 2022-10-02 NOTE — ED Notes (Signed)
Patient placed back on waiting room after neb treatment.

## 2022-10-02 NOTE — ED Provider Notes (Signed)
[facility] EMERGENCY DEPARTMENT Provider Note   CSN: [id number] Arrival date & time: 10/02/22  [id number]     History  Chief Complaint  Patient presents with   Shortness of Breath    Katie Spencer is a 42 y.o. female.   Shortness of Breath    Patient with medical history of chronic back and abdominal pain presents to the emergency department due to shortness of breath.  Patient was diagnosed with COVID 09/28/2022.  She was given an inhaler for the wheezing.  States by that she was feeling worsening shortness of breath and chest pain 2 days ago was seen at [facility].  Had extensive workup including a negative dimer and a chest x-ray which showed possible developing pneumonia.  Started on doxycycline, discharged stable condition.  Today she presents to the ED because again she is feeling short of breath.  She has generalized malaise, she still is having chest pain when she coughs but not at rest.  States that having the DuoNeb in triage earlier helped her symptoms.  She is still feeling short of breath however.  Home Medications Prior to Admission medications   Medication Sig Start Date End Date Taking? Authorizing Provider  nirmatrelvir & ritonavir (PAXLOVID, 300/100,) 20 x 150 MG & 10 x 100MG  TBPK Take 3 tablets by mouth 2 (two) times daily for 5 days. 10/02/22 10/07/22 Yes Ramya Vanbergen, Hildred Alamin, PA-C  ABILIFY MAINTENA 400 MG SRER injection Inject 400 mg into the muscle every 28 (twenty-eight) days. 06/18/22   [provider]  butalbital-acetaminophen-caffeine (FIORICET) 50-325-40 MG tablet Take 1 tablet by mouth 2 (two) times daily as needed for headache.    [provider]  citalopram (CELEXA) 40 MG tablet Take 40 mg by mouth daily. 01/01/20   [provider]  cyclobenzaprine (FLEXERIL) 10 MG tablet Take 10 mg by mouth 2 (two) times daily as needed for muscle spasms. 05/25/22   [provider]  dicyclomine (BENTYL) 20 MG tablet Take 1 tablet (20 mg  total) by mouth every 8 (eight) hours as needed for spasms. Patient not taking: Reported on 07/15/2022 12/03/20   Petrucelli, Glynda Jaeger, PA-C  doxycycline (VIBRAMYCIN) 100 MG capsule Take 1 capsule (100 mg total) by mouth 2 (two) times daily. Patient not taking: Reported on 07/15/2022 06/03/22   Fredia Sorrow, MD  gabapentin (NEURONTIN) 600 MG tablet Take 600 mg by mouth 3 (three) times daily. 05/19/22   [provider]  HYDROcodone-acetaminophen (NORCO/VICODIN) 5-325 MG tablet Take 1 tablet by mouth 3 (three) times daily as needed for moderate pain or severe pain. 07/09/22   [provider]  hydrOXYzine (ATARAX/VISTARIL) 25 MG tablet Take 25 mg by mouth 3 (three) times daily as needed for anxiety.  04/30/20   [provider]  ibuprofen (ADVIL) 800 MG tablet Take 1 tablet (800 mg total) by mouth 3 (three) times daily. 07/15/22   Antonietta Breach, PA-C  lidocaine (LIDODERM) 5 % Place 1 patch onto the skin daily. Remove & Discard patch within 12 hours or as directed by MD Patient not taking: Reported on 07/15/2022 05/08/22   Maudie Flakes, MD  lidocaine (XYLOCAINE) 2 % solution Use as directed 15 mLs in the mouth or throat every 6 (six) hours as needed for mouth pain. Patient not taking: Reported on 07/15/2022 03/03/21   Sherwood Gambler, MD  loperamide (IMODIUM) 2 MG capsule Take 1 capsule (2 mg total) by mouth 4 (four) times daily as needed for diarrhea or loose stools. 11/22/21  Redwine, Madison A, PA-C  metFORMIN (GLUCOPHAGE-XR) 500 MG 24 hr tablet Take 500 mg by mouth daily. 06/22/22   [provider]  methocarbamol (ROBAXIN) 500 MG tablet Take 1 tablet (500 mg total) by mouth every 8 (eight) hours as needed for muscle spasms. 05/08/22   Sabas Sous, MD  methylPREDNISolone (MEDROL DOSEPAK) 4 MG TBPK tablet Take as prescribed Patient not taking: Reported on 07/15/2022 05/10/22   Kommor, Wyn Forster, MD  metoCLOPramide (REGLAN) 10 MG tablet Take 1 tablet (10 mg total) by  mouth 2 (two) times daily as needed for nausea. Patient not taking: Reported on 07/15/2022 11/22/21   Placido Sou, PA-C  omeprazole (PRILOSEC) 40 MG capsule Take 40 mg by mouth daily as needed (indigestion). 02/15/22   [provider]  ondansetron (ZOFRAN ODT) 4 MG disintegrating tablet Take 1 tablet (4 mg total) by mouth every 8 (eight) hours as needed for nausea or vomiting. Patient not taking: Reported on 07/15/2022 12/03/20   Petrucelli, Pleas Koch, PA-C  pantoprazole (PROTONIX) 40 MG tablet Take 1 tablet (40 mg total) by mouth 2 (two) times daily. 11/22/21 12/22/21  Redwine, Madison A, PA-C  potassium chloride SA (KLOR-CON M) 20 MEQ tablet Take 1 tablet (20 mEq total) by mouth 2 (two) times daily. 08/21/22   Dione Booze, MD  predniSONE (DELTASONE) 10 MG tablet Take 2 tablets (20 mg total) by mouth daily. Patient not taking: Reported on 07/15/2022 09/21/21   Theron Arista, PA-C  promethazine (PHENERGAN) 25 MG suppository Place 1 suppository (25 mg total) rectally every 8 (eight) hours as needed for nausea or vomiting. Patient not taking: Reported on 07/15/2022 11/22/21   Placido Sou, PA-C  sucralfate (CARAFATE) 1 GM/10ML suspension Take 10 mLs (1 g total) by mouth 4 (four) times daily -  with meals and at bedtime. Patient not taking: Reported on 07/15/2022 03/03/21   Pricilla Loveless, MD  SUMAtriptan (IMITREX) 100 MG tablet Take 100 mg by mouth every 2 (two) hours as needed for migraine. 02/09/22   [provider]  tamsulosin (FLOMAX) 0.4 MG CAPS capsule Take 1 capsule (0.4 mg total) by mouth daily. Patient not taking: Reported on 07/15/2022 10/24/20   Dartha Lodge, PA-C  belladonna-opium (B&O SUPPRETTES) 16.2-30 MG suppository Place 1 suppository rectally every 8 (eight) hours as needed for pain. Patient not taking: Reported on 05/27/2020 05/20/20 08/18/20  Gilda Crease, MD  lansoprazole (PREVACID) 30 MG capsule Take 1 capsule daily while taking ibuprofen. Patient not taking:  Reported on 05/27/2020 04/16/20 08/18/20  Molpus, Jonny Ruiz, MD      Allergies    Morphine and related, Almond (diagnostic), and Nsaids    Review of Systems   Review of Systems  Respiratory:  Positive for shortness of breath.     Physical Exam Updated Vital Signs BP (!) 104/58 (BP Location: Right Arm)   Pulse 91   Temp 98.7 F (37.1 C) (Oral)   Resp (!) 25   LMP 04/04/2020 Comment: neg preg test  SpO2 100%  Physical Exam Vitals and nursing note reviewed. Exam conducted with a chaperone present.  Constitutional:      Appearance: Normal appearance.  HENT:     Head: Normocephalic and atraumatic.  Eyes:     General: No scleral icterus.       Right eye: No discharge.        Left eye: No discharge.     Extraocular Movements: Extraocular movements intact.     Pupils: Pupils are equal, round, and reactive to  light.  Cardiovascular:     Rate and Rhythm: Normal rate and regular rhythm.     Pulses: Normal pulses.     Heart sounds: Normal heart sounds. No murmur heard.    No friction rub. No gallop.  Pulmonary:     Effort: Pulmonary effort is normal. No tachypnea or respiratory distress.     Breath sounds: Examination of the right-lower field reveals wheezing. Examination of the left-lower field reveals wheezing. Wheezing present.  Abdominal:     General: Abdomen is flat. Bowel sounds are normal. There is no distension.     Palpations: Abdomen is soft.     Tenderness: There is no abdominal tenderness.  Skin:    General: Skin is warm and dry.     Coloration: Skin is not jaundiced.  Neurological:     Mental Status: She is alert. Mental status is at baseline.     Coordination: Coordination normal.     ED Results / Procedures / Treatments   Labs (all labs ordered are listed, but only abnormal results are displayed) Labs Reviewed  BASIC METABOLIC PANEL - Abnormal; Notable for the following components:      Result Value   Sodium 132 (*)    Potassium 3.2 (*)    CO2 18 (*)     Glucose, Bld 158 (*)    Calcium 8.7 (*)    All other components within normal limits  CBC WITH DIFFERENTIAL/PLATELET    EKG None  Radiology DG Chest Port 1 View  Result Date: 10/02/2022 CLINICAL DATA:  ok EXAM: PORTABLE CHEST 1 VIEW COMPARISON:  None Available. FINDINGS: The heart size and mediastinal contours are within normal limits. Both lungs are clear. The visualized skeletal structures are unremarkable. IMPRESSION: No active disease. Electronically Signed   By: Sandi Mariscal M.D.   On: 10/02/2022 10:18    Procedures Procedures    Medications Ordered in ED Medications  albuterol (PROVENTIL) (2.5 MG/3ML) 0.083% nebulizer solution (5 mg  Given 10/02/22 0924)  ipratropium-albuterol (DUONEB) 0.5-2.5 (3) MG/3ML nebulizer solution 3 mL (3 mLs Nebulization Given 10/02/22 1032)    ED Course/ Medical Decision Making/ A&P                             Medical Decision Making Amount and/or Complexity of Data Reviewed Labs: ordered. Radiology: ordered.  Risk Prescription drug management.   Patient presents to the emergency admitted due to feeling short of breath.  Differential includes but not limited to COVID, pneumonia, ACS, PE.  I viewed external medical records as documented in HPI, patient has had 2 previous ED visits all of which I reviewed.  Her last dimer 2 days ago was negative, additionally she is PERC negative and I have a very low suspicion this would be a PE.  Do not think we need to repeat a dimer to evaluate with a CTA PE study.  Additionally the chest pain does not sound cardiac in nature, doubt ACS.  Will start with chest x-ray, EKG and basic labs.  I have also ordered 2 DuoNeb's given she has some wheezing on exam.  She is not hypoxic or tachypneic and not in any respiratory distress, also no SIRS criteria.  I ordered, viewed and interpreted laboratory workup.  Mildly hypokalemic with a potassium of 3.2, no AKI.  No leukocytosis.  EKG shows sinus rhythm without any  ischemic changes.  Chest x-ray is negative for acute process.    I  have reevaluated the patient who feels improved after breathing treatments.  We discussed return precautions, will start on Paxlovid given he is still within the 5-day window.  Stable for discharge at this time.        Final Clinical Impression(s) / ED Diagnoses Final diagnoses:  [id number]    Rx / DC Orders ED Discharge Orders          Ordered    nirmatrelvir & ritonavir (PAXLOVID, 300/100,) 20 x 150 MG & 10 x 100MG  TBPK  2 times daily        10/02/22 1053              Sherrill Raring, PA-C 10/02/22 1057    Malvin Johns, MD 10/02/22 1403

## 2022-10-02 NOTE — ED Notes (Signed)
RT called to triage to assess patient. When I arrived patient was tearful, hyperventilating. I instructed with deep breaths. SAt 100%. Stated she had taken MDI at home without relief. BBS clear, diminished in bases. Albuterol neb given in triage to see if she would get some relief. History of asthma. Covid positive and recently diagnosed with PNA

## 2022-10-02 NOTE — Discharge Instructions (Signed)
Take paxlovid as prescribed. Follow up with primary.

## 2022-10-02 NOTE — ED Triage Notes (Signed)
Recurrent shortness of breath , covid + x 2 days , anxious in triage , hyperventilating . Chest pain with deep inhalation .

## 2022-10-03 ENCOUNTER — Emergency Department (HOSPITAL_BASED_OUTPATIENT_CLINIC_OR_DEPARTMENT_OTHER): Payer: Medicaid Other

## 2022-10-03 ENCOUNTER — Other Ambulatory Visit: Payer: Self-pay

## 2022-10-03 ENCOUNTER — Emergency Department (HOSPITAL_BASED_OUTPATIENT_CLINIC_OR_DEPARTMENT_OTHER)
Admission: EM | Admit: 2022-10-03 | Discharge: 2022-10-03 | Disposition: A | Discharge status: Home / Self Care | Payer: Medicaid Other | Attending: Emergency Medicine | Admitting: Emergency Medicine

## 2022-10-03 DIAGNOSIS — E876 Hypokalemia: Secondary | ICD-10-CM | POA: Diagnosis not present

## 2022-10-03 DIAGNOSIS — R062 Wheezing: Secondary | ICD-10-CM

## 2022-10-03 DIAGNOSIS — U071 COVID-19: Secondary | ICD-10-CM | POA: Diagnosis not present

## 2022-10-03 DIAGNOSIS — R0602 Shortness of breath: Secondary | ICD-10-CM | POA: Diagnosis present

## 2022-10-03 LAB — BASIC METABOLIC PANEL
Anion gap: 10 (ref 5–15)
BUN: 11 mg/dL (ref 6–20)
CO2: 22 mmol/L (ref 22–32)
Calcium: 9.1 mg/dL (ref 8.9–10.3)
Chloride: 104 mmol/L (ref 98–111)
Creatinine, Ser: 0.77 mg/dL (ref 0.44–1.00)
GFR, Estimated: >60 mL/min (ref >60)
Glucose, Bld: 140 mg/dL — ABNORMAL HIGH (ref 70–99)
Potassium: 3.4 mmol/L — ABNORMAL LOW (ref 3.5–5.1)
Sodium: 136 mmol/L (ref 135–145)

## 2022-10-03 LAB — CBC WITH DIFFERENTIAL/PLATELET
Abs Immature Granulocytes: 0.04 10*3/uL (ref 0.00–0.07)
Basophils Absolute: 0 10*3/uL (ref 0.0–0.1)
Basophils Relative: 0 %
Eosinophils Absolute: 0.1 10*3/uL (ref 0.0–0.5)
Eosinophils Relative: 1 %
HCT: 37.5 % (ref 36.0–46.0)
Hemoglobin: 13.4 g/dL (ref 12.0–15.0)
Immature Granulocytes: 0 %
Lymphocytes Relative: 34 %
Lymphs Abs: 3.1 10*3/uL (ref 0.7–4.0)
MCH: 32.2 pg (ref 26.0–34.0)
MCHC: 35.7 g/dL (ref 30.0–36.0)
MCV: 90.1 fL (ref 80.0–100.0)
Monocytes Absolute: 0.6 10*3/uL (ref 0.1–1.0)
Monocytes Relative: 6 %
Neutro Abs: 5.3 10*3/uL (ref 1.7–7.7)
Neutrophils Relative %: 59 %
Platelets: 224 10*3/uL (ref 150–400)
RBC: 4.16 MIL/uL (ref 3.87–5.11)
RDW: 12 % (ref 11.5–15.5)
WBC: 9 10*3/uL (ref 4.0–10.5)
nRBC: 0 % (ref 0.0–0.2)

## 2022-10-03 LAB — MAGNESIUM: Magnesium: 1.8 mg/dL (ref 1.7–2.4)

## 2022-10-03 MED ORDER — ONDANSETRON 4 MG PO TBDP
4.0000 mg | ORAL_TABLET | Freq: Once | ORAL | Status: AC
  Administered 2022-10-03: 4 mg via ORAL
  Filled 2022-10-03: qty 1

## 2022-10-03 MED ORDER — POTASSIUM CHLORIDE CRYS ER 20 MEQ PO TBCR
20.0000 meq | EXTENDED_RELEASE_TABLET | Freq: Once | ORAL | Status: AC
  Administered 2022-10-03: 20 meq via ORAL
  Filled 2022-10-03: qty 1

## 2022-10-03 MED ORDER — ALBUTEROL SULFATE HFA 108 (90 BASE) MCG/ACT IN AERS
2.0000 | INHALATION_SPRAY | Freq: Once | RESPIRATORY_TRACT | Status: DC
  Filled 2022-10-03: qty 6.7

## 2022-10-03 MED ORDER — ONDANSETRON HCL 4 MG PO TABS: 4.0000 mg | ORAL_TABLET | Freq: Four times a day (QID) | ORAL | 0 refills | Status: DC

## 2022-10-03 NOTE — ED Provider Notes (Signed)
[facility] EMERGENCY DEPARTMENT Provider Note   CSN: [id number] Arrival date & time: 10/03/22  1209     History  Chief Complaint  Patient presents with   Shortness of Breath   COVID+    Katie Spencer is a 42 y.o. female.   Shortness of Breath    42 year old female who presents to the emergency department with worsening symptoms of COVID-19.  The patient was seen in the emergency department on 10/02/2022 and diagnosed with COVID-19.  She has been taking Paxlovid.  She has been utilizing an albuterol inhaler and has been on a course of steroids outpatient.  She endorses worsening dyspnea.  She was diagnosed 5 days ago on 1/15.  She denies any chest discomfort, endorses mild discomfort on coughing and taking a deep breath.  No ongoing chest pain.  No abdominal pain.  She is tolerating oral intake.  Home Medications Prior to Admission medications   Medication Sig Start Date End Date Taking? Authorizing Provider  ondansetron (ZOFRAN) 4 MG tablet Take 1 tablet (4 mg total) by mouth every 6 (six) hours. 10/03/22  Yes Ernie Avena, MD  ABILIFY MAINTENA 400 MG SRER injection Inject 400 mg into the muscle every 28 (twenty-eight) days. 06/18/22   [provider]  butalbital-acetaminophen-caffeine (FIORICET) 50-325-40 MG tablet Take 1 tablet by mouth 2 (two) times daily as needed for headache.    [provider]  citalopram (CELEXA) 40 MG tablet Take 40 mg by mouth daily. 01/01/20   [provider]  cyclobenzaprine (FLEXERIL) 10 MG tablet Take 10 mg by mouth 2 (two) times daily as needed for muscle spasms. 05/25/22   [provider]  dicyclomine (BENTYL) 20 MG tablet Take 1 tablet (20 mg total) by mouth every 8 (eight) hours as needed for spasms. Patient not taking: Reported on 07/15/2022 12/03/20   Petrucelli, Pleas Koch, PA-C  doxycycline (VIBRAMYCIN) 100 MG capsule Take 1 capsule (100 mg total) by mouth 2 (two) times daily. Patient not taking:  Reported on 07/15/2022 06/03/22   Vanetta Mulders, MD  gabapentin (NEURONTIN) 600 MG tablet Take 600 mg by mouth 3 (three) times daily. 05/19/22   [provider]  HYDROcodone-acetaminophen (NORCO/VICODIN) 5-325 MG tablet Take 1 tablet by mouth 3 (three) times daily as needed for moderate pain or severe pain. 07/09/22   [provider]  hydrOXYzine (ATARAX/VISTARIL) 25 MG tablet Take 25 mg by mouth 3 (three) times daily as needed for anxiety.  04/30/20   [provider]  ibuprofen (ADVIL) 800 MG tablet Take 1 tablet (800 mg total) by mouth 3 (three) times daily. 07/15/22   Antony Madura, PA-C  lidocaine (LIDODERM) 5 % Place 1 patch onto the skin daily. Remove & Discard patch within 12 hours or as directed by MD Patient not taking: Reported on 07/15/2022 05/08/22   Sabas Sous, MD  lidocaine (XYLOCAINE) 2 % solution Use as directed 15 mLs in the mouth or throat every 6 (six) hours as needed for mouth pain. Patient not taking: Reported on 07/15/2022 03/03/21   Pricilla Loveless, MD  loperamide (IMODIUM) 2 MG capsule Take 1 capsule (2 mg total) by mouth 4 (four) times daily as needed for diarrhea or loose stools. 11/22/21   Redwine, Madison A, PA-C  metFORMIN (GLUCOPHAGE-XR) 500 MG 24 hr tablet Take 500 mg by mouth daily. 06/22/22   [provider]  methocarbamol (ROBAXIN) 500 MG tablet Take 1 tablet (500 mg total) by mouth every 8 (eight) hours as needed for muscle  spasms. 05/08/22   Maudie Flakes, MD  methylPREDNISolone (MEDROL DOSEPAK) 4 MG TBPK tablet Take as prescribed Patient not taking: Reported on 07/15/2022 05/10/22   Kommor, Debe Coder, MD  metoCLOPramide (REGLAN) 10 MG tablet Take 1 tablet (10 mg total) by mouth 2 (two) times daily as needed for nausea. Patient not taking: Reported on 07/15/2022 11/22/21   Rayna Sexton, PA-C  nirmatrelvir & ritonavir (PAXLOVID, 300/100,) 20 x 150 MG & 10 x 100MG  TBPK Take 3 tablets by mouth 2 (two) times daily for 5 days. 10/02/22  10/07/22  Sherrill Raring, PA-C  omeprazole (PRILOSEC) 40 MG capsule Take 40 mg by mouth daily as needed (indigestion). 02/15/22   [provider]  ondansetron (ZOFRAN ODT) 4 MG disintegrating tablet Take 1 tablet (4 mg total) by mouth every 8 (eight) hours as needed for nausea or vomiting. Patient not taking: Reported on 07/15/2022 12/03/20   Petrucelli, Glynda Jaeger, PA-C  pantoprazole (PROTONIX) 40 MG tablet Take 1 tablet (40 mg total) by mouth 2 (two) times daily. 11/22/21 12/22/21  Redwine, Madison A, PA-C  potassium chloride SA (KLOR-CON M) 20 MEQ tablet Take 1 tablet (20 mEq total) by mouth 2 (two) times daily. 34/1/93   Delora Fuel, MD  predniSONE (DELTASONE) 10 MG tablet Take 2 tablets (20 mg total) by mouth daily. Patient not taking: Reported on 07/15/2022 09/21/21   Sherrill Raring, PA-C  promethazine (PHENERGAN) 25 MG suppository Place 1 suppository (25 mg total) rectally every 8 (eight) hours as needed for nausea or vomiting. Patient not taking: Reported on 07/15/2022 11/22/21   Rayna Sexton, PA-C  sucralfate (CARAFATE) 1 GM/10ML suspension Take 10 mLs (1 g total) by mouth 4 (four) times daily -  with meals and at bedtime. Patient not taking: Reported on 07/15/2022 03/03/21   Sherwood Gambler, MD  SUMAtriptan (IMITREX) 100 MG tablet Take 100 mg by mouth every 2 (two) hours as needed for migraine. 02/09/22   [provider]  tamsulosin (FLOMAX) 0.4 MG CAPS capsule Take 1 capsule (0.4 mg total) by mouth daily. Patient not taking: Reported on 07/15/2022 10/24/20   Jacqlyn Larsen, PA-C  belladonna-opium (B&O SUPPRETTES) 16.2-30 MG suppository Place 1 suppository rectally every 8 (eight) hours as needed for pain. Patient not taking: Reported on 05/27/2020 05/20/20 08/18/20  Orpah Greek, MD  lansoprazole (PREVACID) 30 MG capsule Take 1 capsule daily while taking ibuprofen. Patient not taking: Reported on 05/27/2020 04/16/20 08/18/20  Molpus, Jenny Reichmann, MD      Allergies    Morphine and  related, Almond (diagnostic), and Nsaids    Review of Systems   Review of Systems  Respiratory:  Positive for shortness of breath.     Physical Exam Updated Vital Signs BP 108/71   Pulse 81   Temp 97.8 F (36.6 C)   Resp 16   Ht 5\' 4"  (1.626 m)   Wt 102.1 kg   LMP 04/04/2020 Comment: neg preg test  SpO2 98%   BMI 38.62 kg/m  Physical Exam Vitals and nursing note reviewed.  Constitutional:      General: She is not in acute distress.    Appearance: She is well-developed.     Comments: Resting comfortably in no distress  HENT:     Head: Normocephalic and atraumatic.  Eyes:     Conjunctiva/sclera: Conjunctivae normal.  Cardiovascular:     Rate and Rhythm: Normal rate and regular rhythm.     Heart sounds: No murmur heard. Pulmonary:     Effort: Pulmonary effort  is normal. No respiratory distress.     Breath sounds: Wheezing present.     Comments: Mild expiratory wheezing present Abdominal:     Palpations: Abdomen is soft.     Tenderness: There is no abdominal tenderness.  Musculoskeletal:        General: No swelling.     Cervical back: Neck supple.  Skin:    General: Skin is warm and dry.     Capillary Refill: Capillary refill takes less than 2 seconds.  Neurological:     Mental Status: She is alert.  Psychiatric:        Mood and Affect: Mood normal.     ED Results / Procedures / Treatments   Labs (all labs ordered are listed, but only abnormal results are displayed) Labs Reviewed  BASIC METABOLIC PANEL - Abnormal; Notable for the following components:      Result Value   Potassium 3.4 (*)    Glucose, Bld 140 (*)    All other components within normal limits  CBC WITH DIFFERENTIAL/PLATELET  MAGNESIUM    EKG EKG Interpretation  Date/Time:  Tuesday October 03 2022 12:18:00 EST Ventricular Rate:  111 PR Interval:  121 QRS Duration: 93 QT Interval:  326 QTC Calculation: 443 R Axis:   72 Text Interpretation: Sinus tachycardia Confirmed by Regan Lemming ([id number]) on 10/03/2022 2:29:59 PM  Radiology DG Chest Portable 1 View  Result Date: 10/03/2022 CLINICAL DATA:  COVID.  Short of breath EXAM: PORTABLE CHEST 1 VIEW COMPARISON:  None Available. FINDINGS: Normal mediastinum and cardiac silhouette. Normal pulmonary vasculature. No evidence of effusion, infiltrate, or pneumothorax. No acute bony abnormality. IMPRESSION: No evidence of pulmonary infection. Electronically Signed   By: Suzy Bouchard M.D.   On: 10/03/2022 14:20    Procedures Procedures    Medications Ordered in ED Medications  ondansetron (ZOFRAN-ODT) disintegrating tablet 4 mg (4 mg Oral Given 10/03/22 1527)  potassium chloride SA (KLOR-CON M) CR tablet 20 mEq (20 mEq Oral Given 10/03/22 1554)    ED Course/ Medical Decision Making/ A&P                             Medical Decision Making Amount and/or Complexity of Data Reviewed Labs: ordered. Radiology: ordered.  Risk Prescription drug management.    42 year old female who presents to the emergency department with worsening symptoms of COVID-19.  The patient was seen in the emergency department on 10/02/2022 and diagnosed with COVID-19.  She has been taking Paxlovid.  She has been utilizing an albuterol inhaler and has been on a course of steroids outpatient.  She endorses worsening dyspnea.  She was diagnosed 5 days ago on 1/15.  She denies any chest discomfort, endorses mild discomfort on coughing and taking a deep breath.  No ongoing chest pain.  No abdominal pain.  She is tolerating oral intake.  On arrival, the patient was mildly tachycardic P1 16 which subsequently resolved without intervention.  On my evaluation, the patient had no tachycardia on telemetry, was saturating 100% on room air, hemodynamically stable BP 118/86, afebrile.  She is overall well-appearing.  Mild expiratory wheezing noted on exam.  RT evaluated the patient bedside and noted that the patient had been utilizing her spacer and inhaler  incorrectly.  Patient was educated regarding inhaler use.  Patient was resting comfortably and in no distress.  Her vitals are generally unremarkable and I have low suspicion for acute PE at this time.  Discussed risk of PE and COVID-19 with the patient.  Initial EKG revealed sinus tachycardia, ventricular rate 111, no acute ischemic changes noted.  Chest x-ray performed which was unremarkable with no evidence of superimposed bacterial pneumonia.  Screening laboratory evaluation significant for CBC without a leukocytosis or anemia, BMP with mild hypokalemia to 3.4, replenished orally, mild hypomagnesemia.  The patient had 1 episode of emesis in the ED and was administered Zofran.  She was notably tolerating oral intake and well-appearing.  I discussed return precautions in the event of worsening symptoms of potential pulmonary embolism which could be a sequela of her infection.  She has had no signs or symptoms of DVT.  No lower extremity swelling, no leg cramping.  She is currently chest pain-free.  She is not tachycardic or tachypneic and saturating well on room air.  Overall at this time, I believe the patient is stable for continued outpatient management.  Advised continued completion of her outpatient steroid and Paxlovid treatment, continued use of an albuterol MDI.  Stable for discharge.  Final Clinical Impression(s) / ED Diagnoses Final diagnoses:  COVID-19  Wheezing    Rx / DC Orders ED Discharge Orders          Ordered    ondansetron (ZOFRAN) 4 MG tablet  Every 6 hours        10/03/22 1524              Ernie Avena, MD 10/04/22 1004

## 2022-10-03 NOTE — ED Triage Notes (Signed)
Pt c/o recurrent SOB.  Covid Dx x5 days ago.  Pt was seen 1/15 for same.  Pt noted to be hyperventilating in triage.  Report she take her inhaler several times w/o relief.

## 2022-10-03 NOTE — ED Notes (Signed)
Pt ambulating to check out window. Began complaining of nausea. Vomited large amount yellow vomits in emesis bag. Taken back to room. EDP notified and medication ordered

## 2022-10-03 NOTE — ED Notes (Signed)
Patient has 180 puffs remaining in MDI Rx'd on 09/29/22. Patient education given.

## 2022-10-03 NOTE — Discharge Instructions (Signed)
Your laboratory evaluation and chest x-ray was reassuring.  You had mild expiratory wheezing on exam.  Recommend you continue your course of Paxlovid, steroids and utilize your albuterol inhaler outpatient.  We discussed further testing to include CTA imaging but given your reassuring vitals and exam, will hold on that testing at this time.  If you have experience any significant worsening of your symptoms please return to the emergency department for consideration for evaluation for a blood clot in your lungs.

## 2022-10-03 NOTE — ED Notes (Signed)
Reviewed discharge instructions and recommendations with pt. Pt states understanding- 

## 2022-10-03 NOTE — ED Notes (Signed)
Pt tolerating po. No further vomiting

## 2022-11-01 DIAGNOSIS — F32A Depression, unspecified: Secondary | ICD-10-CM | POA: Diagnosis present

## 2022-11-17 ENCOUNTER — Encounter (HOSPITAL_COMMUNITY): Payer: Self-pay

## 2022-11-17 ENCOUNTER — Emergency Department (HOSPITAL_COMMUNITY)
Admission: EM | Admit: 2022-11-17 | Discharge: 2022-11-18 | Disposition: A | Discharge status: Home / Self Care | Payer: Medicaid Other | Attending: Emergency Medicine | Admitting: Emergency Medicine

## 2022-11-17 ENCOUNTER — Other Ambulatory Visit: Payer: Self-pay

## 2022-11-17 DIAGNOSIS — M5416 Radiculopathy, lumbar region: Secondary | ICD-10-CM | POA: Diagnosis not present

## 2022-11-17 DIAGNOSIS — M545 Low back pain, unspecified: Secondary | ICD-10-CM | POA: Diagnosis present

## 2022-11-17 MED ORDER — PREDNISONE 20 MG PO TABS
60.0000 mg | ORAL_TABLET | Freq: Once | ORAL | Status: AC
  Administered 2022-11-17: 60 mg via ORAL
  Filled 2022-11-17: qty 3

## 2022-11-17 MED ORDER — CYCLOBENZAPRINE HCL 10 MG PO TABS: 10.0000 mg | ORAL_TABLET | Freq: Two times a day (BID) | ORAL | 0 refills | Status: DC | PRN

## 2022-11-17 MED ORDER — PREDNISONE 20 MG PO TABS: ORAL_TABLET | ORAL | 0 refills | Status: DC

## 2022-11-17 MED ORDER — OXYCODONE-ACETAMINOPHEN 5-325 MG PO TABS
2.0000 | ORAL_TABLET | Freq: Once | ORAL | Status: AC
  Administered 2022-11-17: 2 via ORAL
  Filled 2022-11-17: qty 2

## 2022-11-17 NOTE — ED Provider Notes (Signed)
[facility] Provider Note   CSN: [id number] Arrival date & time: 11/17/22  2230     History {Add pertinent medical, surgical, social history, OB history to HPI:1} Chief Complaint  Patient presents with   Back Pain    Pt in with c/o lower back pain, pt states she was getting out of the car when she felt something pull. Stabbing like pain 9/10 radiating up back with pressure in groin    Katie Spencer is a 42 y.o. female.  The history is provided by the patient and medical records. No language interpreter was used.  Back Pain    42 year old female significant history of lumbar radiculopathy, chronic abdominal pain, chronic back pain, presenting complaining of low back pain.  Patient report this afternoon she was going to a meeting and upon getting out of the car she felt a pop follows with intense pain to her lower back with pain spreading down to both legs down to her feet.  She then endorse some tingling sensation to both legs.  Pain worse with movement.  No associated fever or chills no abdominal pain no dysuria no bowel bladder incontinence or saddle anesthesia.  She was able to ambulate but movement makes the pain worse.  She also reported history of chronic back pain and does follow with pain management for which she received Percocet 3 times daily daily.  Current medication has not adequately controlled her pain.  She denies any history of IV drug use or active cancer.  Home Medications Prior to Admission medications   Medication Sig Start Date End Date Taking? Authorizing Provider  ABILIFY MAINTENA 400 MG SRER injection Inject 400 mg into the muscle every 28 (twenty-eight) days. 06/18/22   [provider]  butalbital-acetaminophen-caffeine (FIORICET) 50-325-40 MG tablet Take 1 tablet by mouth 2 (two) times daily as needed for headache.    [provider]  citalopram (CELEXA) 40 MG tablet Take 40 mg by mouth daily.  01/01/20   [provider]  cyclobenzaprine (FLEXERIL) 10 MG tablet Take 10 mg by mouth 2 (two) times daily as needed for muscle spasms. 05/25/22   [provider]  dicyclomine (BENTYL) 20 MG tablet Take 1 tablet (20 mg total) by mouth every 8 (eight) hours as needed for spasms. Patient not taking: Reported on 07/15/2022 12/03/20   Petrucelli, Glynda Jaeger, PA-C  doxycycline (VIBRAMYCIN) 100 MG capsule Take 1 capsule (100 mg total) by mouth 2 (two) times daily. Patient not taking: Reported on 07/15/2022 06/03/22   Fredia Sorrow, MD  gabapentin (NEURONTIN) 600 MG tablet Take 600 mg by mouth 3 (three) times daily. 05/19/22   [provider]  HYDROcodone-acetaminophen (NORCO/VICODIN) 5-325 MG tablet Take 1 tablet by mouth 3 (three) times daily as needed for moderate pain or severe pain. 07/09/22   [provider]  hydrOXYzine (ATARAX/VISTARIL) 25 MG tablet Take 25 mg by mouth 3 (three) times daily as needed for anxiety.  04/30/20   [provider]  ibuprofen (ADVIL) 800 MG tablet Take 1 tablet (800 mg total) by mouth 3 (three) times daily. 07/15/22   Antonietta Breach, PA-C  lidocaine (LIDODERM) 5 % Place 1 patch onto the skin daily. Remove & Discard patch within 12 hours or as directed by MD Patient not taking: Reported on 07/15/2022 05/08/22   Maudie Flakes, MD  lidocaine (XYLOCAINE) 2 % solution Use as directed 15 mLs in the mouth or throat every 6 (six) hours as needed for mouth  pain. Patient not taking: Reported on 07/15/2022 03/03/21   Sherwood Gambler, MD  loperamide (IMODIUM) 2 MG capsule Take 1 capsule (2 mg total) by mouth 4 (four) times daily as needed for diarrhea or loose stools. 11/22/21   Redwine, Madison A, PA-C  metFORMIN (GLUCOPHAGE-XR) 500 MG 24 hr tablet Take 500 mg by mouth daily. 06/22/22   [provider]  methocarbamol (ROBAXIN) 500 MG tablet Take 1 tablet (500 mg total) by mouth every 8 (eight) hours as needed for muscle spasms. 05/08/22    Maudie Flakes, MD  methylPREDNISolone (MEDROL DOSEPAK) 4 MG TBPK tablet Take as prescribed Patient not taking: Reported on 07/15/2022 05/10/22   Kommor, Debe Coder, MD  metoCLOPramide (REGLAN) 10 MG tablet Take 1 tablet (10 mg total) by mouth 2 (two) times daily as needed for nausea. Patient not taking: Reported on 07/15/2022 11/22/21   Rayna Sexton, PA-C  omeprazole (PRILOSEC) 40 MG capsule Take 40 mg by mouth daily as needed (indigestion). 02/15/22   [provider]  ondansetron (ZOFRAN ODT) 4 MG disintegrating tablet Take 1 tablet (4 mg total) by mouth every 8 (eight) hours as needed for nausea or vomiting. Patient not taking: Reported on 07/15/2022 12/03/20   Petrucelli, Samantha R, PA-C  ondansetron (ZOFRAN) 4 MG tablet Take 1 tablet (4 mg total) by mouth every 6 (six) hours. 10/03/22   Regan Lemming, MD  pantoprazole (PROTONIX) 40 MG tablet Take 1 tablet (40 mg total) by mouth 2 (two) times daily. 11/22/21 12/22/21  Redwine, Madison A, PA-C  potassium chloride SA (KLOR-CON M) 20 MEQ tablet Take 1 tablet (20 mEq total) by mouth 2 (two) times daily. [id number]   Delora Fuel, MD  predniSONE (DELTASONE) 10 MG tablet Take 2 tablets (20 mg total) by mouth daily. Patient not taking: Reported on 07/15/2022 09/21/21   Sherrill Raring, PA-C  promethazine (PHENERGAN) 25 MG suppository Place 1 suppository (25 mg total) rectally every 8 (eight) hours as needed for nausea or vomiting. Patient not taking: Reported on 07/15/2022 11/22/21   Rayna Sexton, PA-C  sucralfate (CARAFATE) 1 GM/10ML suspension Take 10 mLs (1 g total) by mouth 4 (four) times daily -  with meals and at bedtime. Patient not taking: Reported on 07/15/2022 03/03/21   Sherwood Gambler, MD  SUMAtriptan (IMITREX) 100 MG tablet Take 100 mg by mouth every 2 (two) hours as needed for migraine. 02/09/22   [provider]  tamsulosin (FLOMAX) 0.4 MG CAPS capsule Take 1 capsule (0.4 mg total) by mouth daily. Patient not taking: Reported on  07/15/2022 10/24/20   Jacqlyn Larsen, PA-C  belladonna-opium (B&O SUPPRETTES) 16.2-30 MG suppository Place 1 suppository rectally every 8 (eight) hours as needed for pain. Patient not taking: Reported on 05/27/2020 05/20/20 08/18/20  Orpah Greek, MD  lansoprazole (PREVACID) 30 MG capsule Take 1 capsule daily while taking ibuprofen. Patient not taking: Reported on 05/27/2020 04/16/20 08/18/20  Molpus, Jenny Reichmann, MD      Allergies    Morphine and related, Almond (diagnostic), and Nsaids    Review of Systems   Review of Systems  Musculoskeletal:  Positive for back pain.  All other systems reviewed and are negative.   Physical Exam Updated Vital Signs BP (!) 155/88 (BP Location: Right Arm)   Pulse (!) 101   Temp 98.2 F (36.8 C) (Oral)   Resp 18   Ht '5\' 4"'$  (1.626 m)   Wt 99.8 kg   LMP 04/04/2020 Comment: neg preg test  SpO2 100%   BMI 37.76  kg/m  Physical Exam Vitals and nursing note reviewed.  Constitutional:      General: She is not in acute distress.    Appearance: She is well-developed.     Comments: Patient appears uncomfortable but nontoxic.  HENT:     Head: Atraumatic.  Eyes:     Conjunctiva/sclera: Conjunctivae normal.  Pulmonary:     Effort: Pulmonary effort is normal.  Abdominal:     Palpations: Abdomen is soft.     Tenderness: There is no abdominal tenderness.  Musculoskeletal:        General: Tenderness (Tenderness to lumbar spine without crepitus step-off and no overlying skin changes.) present.     Cervical back: Neck supple.     Comments: Patient able to move both legs with poor effort.  Positive straight leg raise to both legs with intact distal pedal pulses and intact patellar deep tendon reflex.  Skin:    Findings: No rash.  Neurological:     Mental Status: She is alert.  Psychiatric:        Mood and Affect: Mood normal.     ED Results / Procedures / Treatments   Labs (all labs ordered are listed, but only abnormal results are displayed) Labs  Reviewed - No data to display  EKG None  Radiology No results found.  Procedures Procedures  {Document cardiac monitor, telemetry assessment procedure when appropriate:1}  Medications Ordered in ED Medications - No data to display  ED Course/ Medical Decision Making/ A&P   {   Click here for ABCD2, HEART and other calculatorsREFRESH Note before signing :1}                          Medical Decision Making  BP (!) 155/88 (BP Location: Right Arm)   Pulse (!) 101   Temp 98.2 F (36.8 C) (Oral)   Resp 18   Ht '5\' 4"'$  (1.626 m)   Wt 99.8 kg   LMP 04/04/2020 Comment: neg preg test  SpO2 100%   BMI 37.76 kg/m   47:68 PM  42 year old female significant history of lumbar radiculopathy, chronic abdominal pain, chronic back pain, presenting complaining of low back pain.  Patient report this afternoon she was going to a meeting and upon getting out of the car she felt a pop follows with intense pain to her lower back with pain spreading down to both legs down to her feet.  She then endorse some tingling sensation to both legs.  Pain worse with movement.  No associated fever or chills no abdominal pain no dysuria no bowel bladder incontinence or saddle anesthesia.  She was able to ambulate but movement makes the pain worse.  She also reported history of chronic back pain and does follow with pain management for which she received Percocet 3 times daily daily.  Current medication has not adequately controlled her pain.  She denies any history of IV drug use or active cancer.  On exam, patient is laying in bed appears uncomfortable.  She has increasing pain when she flexes her lower back.  She has tenderness to her lumbar spine without any crepitus or step-off no overlying skin changes.  She has positive straight leg raise to both legs but able to move her legs with intact sensation.  Patellar deep and reflexes intact bilaterally with intact distal pedal pulses.  Vital signs notable for elevated  blood pressure of 155/88.  Heart rate of 101.  She is afebrile no hypoxia.  Symptom suggestive of radicular pain such as sciatica.  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  At this time I have low suspicion for cauda equina, discitis, osteomyelitis or other acute emergent medical condition.  Will provide supportive care including steroids, and opiate pain medication.  Plan to discharge home with steroid burst and taper course as well as muscle relaxant.  Advanced imaging considered such as lumbar spine MRI but I have low suspicion for cauda equina or infectious process therefore imaging was not performed.  Low suspicion for fracture or dislocation, x-ray was not performed.  {Document critical care time when appropriate:1} {Document review of labs and clinical decision tools ie heart score, Chads2Vasc2 etc:1}  {Document your independent review of radiology images, and any outside records:1} {Document your discussion with family members, caretakers, and with consultants:1} {Document social determinants of health affecting pt's care:1} {Document your decision making why or why not admission, treatments were needed:1} Final Clinical Impression(s) / ED Diagnoses Final diagnoses:  None    Rx / DC Orders ED Discharge Orders     None

## 2023-01-01 ENCOUNTER — Encounter: Payer: Self-pay | Admitting: *Deleted

## 2023-03-10 ENCOUNTER — Other Ambulatory Visit: Payer: Self-pay

## 2023-03-10 ENCOUNTER — Emergency Department (HOSPITAL_COMMUNITY)
Admission: EM | Admit: 2023-03-10 | Discharge: 2023-03-10 | Disposition: A | Discharge status: Home / Self Care | Payer: Medicaid Other | Attending: Emergency Medicine | Admitting: Emergency Medicine

## 2023-03-10 ENCOUNTER — Encounter (HOSPITAL_COMMUNITY): Payer: Self-pay | Admitting: Emergency Medicine

## 2023-03-10 DIAGNOSIS — E876 Hypokalemia: Secondary | ICD-10-CM | POA: Diagnosis not present

## 2023-03-10 DIAGNOSIS — R112 Nausea with vomiting, unspecified: Secondary | ICD-10-CM | POA: Diagnosis present

## 2023-03-10 DIAGNOSIS — R1084 Generalized abdominal pain: Secondary | ICD-10-CM

## 2023-03-10 DIAGNOSIS — K529 Noninfective gastroenteritis and colitis, unspecified: Secondary | ICD-10-CM | POA: Diagnosis not present

## 2023-03-10 DIAGNOSIS — F17200 Nicotine dependence, unspecified, uncomplicated: Secondary | ICD-10-CM | POA: Insufficient documentation

## 2023-03-10 LAB — BASIC METABOLIC PANEL
Anion gap: 14 (ref 5–15)
BUN: 9 mg/dL (ref 6–20)
CO2: 25 mmol/L (ref 22–32)
Calcium: 9.6 mg/dL (ref 8.9–10.3)
Chloride: 103 mmol/L (ref 98–111)
Creatinine, Ser: 0.98 mg/dL (ref 0.44–1.00)
GFR, Estimated: >60 mL/min (ref >60)
Glucose, Bld: 126 mg/dL — ABNORMAL HIGH (ref 70–99)
Potassium: 3.6 mmol/L (ref 3.5–5.1)
Sodium: 142 mmol/L (ref 135–145)

## 2023-03-10 LAB — CBC
HCT: 37.7 % (ref 36.0–46.0)
Hemoglobin: 13.9 g/dL (ref 12.0–15.0)
MCH: 31.9 pg (ref 26.0–34.0)
MCHC: 36.9 g/dL — ABNORMAL HIGH (ref 30.0–36.0)
MCV: 86.5 fL (ref 80.0–100.0)
Platelets: 265 10*3/uL (ref 150–400)
RBC: 4.36 MIL/uL (ref 3.87–5.11)
RDW: 11.1 % — ABNORMAL LOW (ref 11.5–15.5)
WBC: 8.6 10*3/uL (ref 4.0–10.5)
nRBC: 0 % (ref 0.0–0.2)

## 2023-03-10 LAB — COMPREHENSIVE METABOLIC PANEL
ALT: 21 U/L (ref 0–44)
AST: 20 U/L (ref 15–41)
Albumin: 4.5 g/dL (ref 3.5–5.0)
Alkaline Phosphatase: 74 U/L (ref 38–126)
Anion gap: 17 — ABNORMAL HIGH (ref 5–15)
BUN: 11 mg/dL (ref 6–20)
CO2: 21 mmol/L — ABNORMAL LOW (ref 22–32)
Calcium: 9.9 mg/dL (ref 8.9–10.3)
Chloride: 100 mmol/L (ref 98–111)
Creatinine, Ser: 0.95 mg/dL (ref 0.44–1.00)
GFR, Estimated: >60 mL/min (ref >60)
Glucose, Bld: 129 mg/dL — ABNORMAL HIGH (ref 70–99)
Potassium: 3.2 mmol/L — ABNORMAL LOW (ref 3.5–5.1)
Sodium: 138 mmol/L (ref 135–145)
Total Bilirubin: 1.1 mg/dL (ref 0.3–1.2)
Total Protein: 7.6 g/dL (ref 6.5–8.1)

## 2023-03-10 LAB — URINALYSIS, ROUTINE W REFLEX MICROSCOPIC
Bilirubin Urine: NEGATIVE
Glucose, UA: NEGATIVE mg/dL
Hgb urine dipstick: NEGATIVE
Ketones, ur: 5 mg/dL — AB
Leukocytes,Ua: NEGATIVE
Nitrite: NEGATIVE
Protein, ur: NEGATIVE mg/dL
Specific Gravity, Urine: 1.006 (ref 1.005–1.030)
pH: 8 (ref 5.0–8.0)

## 2023-03-10 LAB — I-STAT BETA HCG BLOOD, ED (MC, WL, AP ONLY): I-stat hCG, quantitative: <5 m[IU]/mL (ref <5)

## 2023-03-10 LAB — LIPASE, BLOOD: Lipase: 26 U/L (ref 11–51)

## 2023-03-10 MED ORDER — LACTATED RINGERS IV BOLUS: 1000.0000 mL | Freq: Once | INTRAVENOUS | Status: DC

## 2023-03-10 MED ORDER — DICYCLOMINE HCL 10 MG PO CAPS
10.0000 mg | ORAL_CAPSULE | Freq: Once | ORAL | Status: AC
  Administered 2023-03-10: 10 mg via ORAL
  Filled 2023-03-10: qty 1

## 2023-03-10 MED ORDER — ONDANSETRON 4 MG PO TBDP
4.0000 mg | ORAL_TABLET | Freq: Once | ORAL | Status: AC | PRN
  Administered 2023-03-10: 4 mg via ORAL

## 2023-03-10 MED ORDER — DROPERIDOL 2.5 MG/ML IJ SOLN
1.2500 mg | Freq: Once | INTRAMUSCULAR | Status: AC
  Administered 2023-03-10: 1.25 mg via INTRAVENOUS
  Filled 2023-03-10: qty 2

## 2023-03-10 MED ORDER — POTASSIUM CHLORIDE 10 MEQ/100ML IV SOLN
10.0000 meq | Freq: Once | INTRAVENOUS | Status: AC
  Administered 2023-03-10: 10 meq via INTRAVENOUS
  Filled 2023-03-10: qty 100

## 2023-03-10 MED ORDER — DICYCLOMINE HCL 20 MG PO TABS: 20.0000 mg | ORAL_TABLET | Freq: Two times a day (BID) | ORAL | 0 refills | Status: AC

## 2023-03-10 MED ORDER — FAMOTIDINE IN NACL 20-0.9 MG/50ML-% IV SOLN
20.0000 mg | Freq: Once | INTRAVENOUS | Status: AC
  Administered 2023-03-10: 20 mg via INTRAVENOUS
  Filled 2023-03-10: qty 50

## 2023-03-10 MED ORDER — LACTATED RINGERS IV BOLUS
2000.0000 mL | Freq: Once | INTRAVENOUS | Status: AC
  Administered 2023-03-10: 2000 mL via INTRAVENOUS

## 2023-03-10 MED ORDER — ONDANSETRON 4 MG PO TBDP: 4.0000 mg | ORAL_TABLET | Freq: Three times a day (TID) | ORAL | 0 refills | Status: DC | PRN

## 2023-03-10 NOTE — Discharge Instructions (Addendum)
Katie Spencer:  Thank you for allowing [place] to take care of you today.  We hope you begin feeling better soon.  To-Do: Please follow-up with your primary doctor. Please return to the Emergency Department or call 911 if you experience chest pain, shortness of breath, severe pain, severe fever, altered mental status, or have any reason to think that you need emergency medical care.  Thank you again.  Hope you feel better soon.  Department of Emergency Medicine [facility]

## 2023-03-10 NOTE — ED Notes (Signed)
Husband Deziyah Arvin [phone] would like an update asap and to speak to his wife asap

## 2023-03-10 NOTE — ED Provider Notes (Signed)
[facility] EMERGENCY DEPARTMENT AT [facility] Provider Note   HPI: Katie Spencer is a 42 year old female with a past medical history as below presenting today with nausea and vomiting.  She reports for 1 week she has had nausea, vomiting, and diarrhea.  She reports she believes she has a GI bug.  She reports she has had developed worsening abdominal pain with her symptoms.  She has not had any objective fevers at home.  She reports she has not been able to tolerate a significant mount of p.o. intake.  She denies any vaginal bleeding or vaginal discharge changes.  She denies any blood in her vomit or blood in her stool.  Due to difficulty tolerating p.o. intake and feeling dehydrated she presented to the hospital.  Past Medical History:  Diagnosis Date   Abnormal vaginal bleeding    Chronic abdominal pain    Chronic back pain    Renal disorder     Past Surgical History:  Procedure Laterality Date   ABDOMINAL HYSTERECTOMY     CESAREAN SECTION  2006, 2009, 2013, 2014   CHOLECYSTECTOMY       Social History   Tobacco Use   Smoking status: Every Day    Packs/day: 1    Types: Cigarettes   Smokeless tobacco: Never  Vaping Use   Vaping Use: Some days   Substances: Nicotine  Substance Use Topics   Alcohol use: Not Currently   Drug use: Never      Review of Systems  A complete ROS was performed with pertinent positives/negatives noted in the HPI.   Vitals:   03/10/23 1930 03/10/23 1945  BP: 123/76 134/76  Pulse: 65 71  Resp: 19 (!) 22  Temp:    SpO2: 95% 97%    Physical Exam Vitals and nursing note reviewed.  Constitutional:      General: She is not in acute distress.    Appearance: She is well-developed.  HENT:     Mouth/Throat:     Mouth: Mucous membranes are dry.  Cardiovascular:     Rate and Rhythm: Normal rate and regular rhythm.     Pulses: Normal pulses.     Heart sounds: Normal heart sounds. No murmur heard.    No friction rub. No gallop.   Pulmonary:     Effort: Pulmonary effort is normal. No respiratory distress.     Breath sounds: Normal breath sounds.  Abdominal:     Palpations: Abdomen is soft.     Tenderness: There is generalized abdominal tenderness. There is no right CVA tenderness, left CVA tenderness, guarding or rebound.  Musculoskeletal:        General: No swelling.     Cervical back: Neck supple.  Skin:    General: Skin is warm and dry.     Capillary Refill: Capillary refill takes 2 to 3 seconds.  Neurological:     Mental Status: She is alert.     Procedures  MDM:  Imaging/radiology results:  No results found.  EKG results:   Lab results:  Results for orders placed or performed during the hospital encounter of 03/10/23 (from the past 24 hour(s))  Lipase, blood     Status: None   Collection Time: 03/10/23  3:38 PM  Result Value Ref Range   Lipase 26 11 - 51 U/L  Comprehensive metabolic panel     Status: Abnormal   Collection Time: 03/10/23  3:38 PM  Result Value Ref Range   Sodium 138 135 -  145 mmol/L   Potassium 3.2 (L) 3.5 - 5.1 mmol/L   Chloride 100 98 - 111 mmol/L   CO2 21 (L) 22 - 32 mmol/L   Glucose, Bld 129 (H) 70 - 99 mg/dL   BUN 11 6 - 20 mg/dL   Creatinine, Ser 0.27 0.44 - 1.00 mg/dL   Calcium 9.9 8.9 - 25.3 mg/dL   Total Protein 7.6 6.5 - 8.1 g/dL   Albumin 4.5 3.5 - 5.0 g/dL   AST 20 15 - 41 U/L   ALT 21 0 - 44 U/L   Alkaline Phosphatase 74 38 - 126 U/L   Total Bilirubin 1.1 0.3 - 1.2 mg/dL   GFR, Estimated >66 >44 mL/min   Anion gap 17 (H) 5 - 15  CBC     Status: Abnormal   Collection Time: 03/10/23  3:38 PM  Result Value Ref Range   WBC 8.6 4.0 - 10.5 K/uL   RBC 4.36 3.87 - 5.11 MIL/uL   Hemoglobin 13.9 12.0 - 15.0 g/dL   HCT 03.4 74.2 - 59.5 %   MCV 86.5 80.0 - 100.0 fL   MCH 31.9 26.0 - 34.0 pg   MCHC 36.9 (H) 30.0 - 36.0 g/dL   RDW 63.8 (L) 75.6 - 43.3 %   Platelets 265 150 - 400 K/uL   nRBC 0.0 0.0 - 0.2 %  Urinalysis, Routine w reflex microscopic -Urine,  Clean Catch     Status: Abnormal   Collection Time: 03/10/23  3:39 PM  Result Value Ref Range   Color, Urine YELLOW YELLOW   APPearance CLOUDY (A) CLEAR   Specific Gravity, Urine 1.006 1.005 - 1.030   pH 8.0 5.0 - 8.0   Glucose, UA NEGATIVE NEGATIVE mg/dL   Hgb urine dipstick NEGATIVE NEGATIVE   Bilirubin Urine NEGATIVE NEGATIVE   Ketones, ur 5 (A) NEGATIVE mg/dL   Protein, ur NEGATIVE NEGATIVE mg/dL   Nitrite NEGATIVE NEGATIVE   Leukocytes,Ua NEGATIVE NEGATIVE  I-Stat beta hCG blood, ED ([facility], [facility], [facility] only)     Status: None   Collection Time: 03/10/23  5:54 PM  Result Value Ref Range   I-stat hCG, quantitative <5.0 <5 mIU/mL   Comment 3          Basic metabolic panel     Status: Abnormal   Collection Time: 03/10/23  8:00 PM  Result Value Ref Range   Sodium 142 135 - 145 mmol/L   Potassium 3.6 3.5 - 5.1 mmol/L   Chloride 103 98 - 111 mmol/L   CO2 25 22 - 32 mmol/L   Glucose, Bld 126 (H) 70 - 99 mg/dL   BUN 9 6 - 20 mg/dL   Creatinine, Ser 2.95 0.44 - 1.00 mg/dL   Calcium 9.6 8.9 - 18.8 mg/dL   GFR, Estimated >41 >66 mL/min   Anion gap 14 5 - 15     Key medications administered in the ER:  Medications  ondansetron (ZOFRAN-ODT) disintegrating tablet 4 mg (4 mg Oral Given 03/10/23 1542)  potassium chloride 10 mEq in 100 mL IVPB (0 mEq Intravenous Stopped 03/10/23 1845)  lactated ringers bolus 2,000 mL (0 mLs Intravenous Stopped 03/10/23 1940)  droperidol (INAPSINE) 2.5 MG/ML injection 1.25 mg (1.25 mg Intravenous Given 03/10/23 1753)  famotidine (PEPCID) IVPB 20 mg premix (0 mg Intravenous Stopped 03/10/23 1824)  dicyclomine (BENTYL) capsule 10 mg (10 mg Oral Given 03/10/23 1943)   Medical decision making: -Vital signs stable. Patient afebrile, hemodynamically stable, and non-toxic appearing. -Patient's presentation is most consistent  with acute complicated illness / injury requiring diagnostic workup.. Katie Spencer is a 42 y.o. female presenting to the emergency department  with abdominal pain.  -Per chart review, patient had CT abdomen and pelvis on 03/04/2023 demonstrating nonspecific colitis. -Emergent causes of abdominal pain considered. Doubt appendicitis given no consistent history or physical exam findings, afebrile, and no leukocytosis or left shift. No bowel symptoms consistent with inflammatory bowel disease present. No evidence of blood or bacteria in urine for nephrolithiasis versus pyelonephritis. No physical exam findings concern for hernia or torsion.  Mesenteric ischemia considered; however, less likely given pain is not out of proportion with physical exam and no CT findings.  Gallbladder/pancreatic etiology considered, with normal hepatic function panel and lipase.  Bowel obstruction considered, but unlikely given history, exam, with patient tolerating p.o. intake after antiemetics and having bowel movements/passing flatus. Given that patient is female, ovarian torsion, ectopic pregnancy, PID versus STI considered; however, considered less likely given negative pregnancy, no new vaginal discharge or bleeding, and no pelvic pain. -Patient was given ODT Zofran in first look which provided some relief but reports persistent symptoms.  Due to acute on chronic abdominal pain will give droperidol, Pepcid, fluids, and Bentyl.  Labs show hypokalemia therefore repletion provided.  Initial CMP shows anion gap acidosis to 17.  Patient given fluids and repeat BMP shows resolution of anion gap.  I suspect this is likely secondary to starvation ketosis in the setting of the patient's nausea and vomiting.  UA is without UTI or hematuria.  After symptomatic medication as above, reassessment performed and patient with a benign abdominal exam.  She reports improvement in pain and has been tolerating p.o. intake in the emergency department.  She is stable for discharge at this time will prescribe Bentyl and Zofran. -Patient safe for discharge home. Discussed clinical impression and  recommendations with the patient at bedside, who voiced understanding and agreement with plan.  Discussed symptomatic treatment and encouraged hydration.  Patient given my usual and customary discussion regarding strict return precautions.  Discussed red flags which would necessitate return to ED for further evaluation and encouraged follow up with PCP.  Any remaining questions or concerns were solicited and addressed prior to discharge.   Medical Decision Making Amount and/or Complexity of Data Reviewed Labs: ordered. Decision-making details documented in ED Course.  Risk Prescription drug management.     The plan for this patient was discussed with Dr. Lynelle Doctor, who voiced agreement and who oversaw evaluation and treatment of this patient.  Marta Lamas, MD Emergency Medicine, PGY-3  Note: Dragon medical dictation software was used in the creation of this note.   Clinical Impression:  1. Nausea vomiting and diarrhea   2. Generalized abdominal pain   3. Gastroenteritis     Rx / DC Orders ED Discharge Orders          Ordered    ondansetron (ZOFRAN-ODT) 4 MG disintegrating tablet  Every 8 hours PRN        03/10/23 2049    dicyclomine (BENTYL) 20 MG tablet  2 times daily        03/10/23 2050               Chase Caller, MD 03/10/23 2137    Linwood Dibbles, MD 03/11/23 [phone]

## 2023-03-10 NOTE — ED Triage Notes (Signed)
Per [facility] pt coming from home c/o nausea, vomiting and diarrhea x 1 week worsening over past 48 hours. Generalized abdominal pain. Denies any urinary symptoms.

## 2023-03-24 ENCOUNTER — Emergency Department (HOSPITAL_COMMUNITY): Payer: MEDICAID

## 2023-03-24 ENCOUNTER — Emergency Department (HOSPITAL_COMMUNITY)
Admission: EM | Admit: 2023-03-24 | Discharge: 2023-03-25 | Disposition: A | Discharge status: Home / Self Care | Payer: MEDICAID | Attending: Emergency Medicine | Admitting: Emergency Medicine

## 2023-03-24 ENCOUNTER — Other Ambulatory Visit: Payer: Self-pay

## 2023-03-24 DIAGNOSIS — S39012A Strain of muscle, fascia and tendon of lower back, initial encounter: Secondary | ICD-10-CM

## 2023-03-24 DIAGNOSIS — X501XXA Overexertion from prolonged static or awkward postures, initial encounter: Secondary | ICD-10-CM | POA: Insufficient documentation

## 2023-03-24 DIAGNOSIS — M545 Low back pain, unspecified: Secondary | ICD-10-CM | POA: Diagnosis present

## 2023-03-24 NOTE — ED Triage Notes (Signed)
Patient has a history of chronic back pain , injured her lower back this afternoon while using a water slide.

## 2023-03-25 MED ORDER — DICLOFENAC SODIUM 50 MG PO TBEC: 50.0000 mg | DELAYED_RELEASE_TABLET | Freq: Two times a day (BID) | ORAL | 0 refills | Status: AC

## 2023-03-25 MED ORDER — PREDNISONE 10 MG (21) PO TBPK: ORAL_TABLET | Freq: Every day | ORAL | 0 refills | Status: DC

## 2023-03-25 MED ORDER — OMEPRAZOLE 40 MG PO CPDR: 40.0000 mg | DELAYED_RELEASE_CAPSULE | Freq: Every day | ORAL | 0 refills | Status: AC | PRN

## 2023-03-25 MED ORDER — LIDOCAINE 5 % EX PTCH
1.0000 | MEDICATED_PATCH | CUTANEOUS | Status: DC
  Administered 2023-03-25: 1 via TRANSDERMAL
  Filled 2023-03-25: qty 1

## 2023-03-25 MED ORDER — METHYLPREDNISOLONE ACETATE 40 MG/ML IJ SUSP
40.0000 mg | Freq: Once | INTRAMUSCULAR | Status: AC
  Administered 2023-03-25: 40 mg via INTRAMUSCULAR
  Filled 2023-03-25: qty 1

## 2023-03-25 MED ORDER — METHOCARBAMOL 500 MG PO TABS: 500.0000 mg | ORAL_TABLET | Freq: Two times a day (BID) | ORAL | 0 refills | Status: DC

## 2023-03-25 MED ORDER — KETOROLAC TROMETHAMINE 15 MG/ML IJ SOLN
15.0000 mg | Freq: Once | INTRAMUSCULAR | Status: AC
  Administered 2023-03-25: 15 mg via INTRAMUSCULAR
  Filled 2023-03-25: qty 1

## 2023-03-25 NOTE — ED Provider Notes (Signed)
[place] EMERGENCY DEPARTMENT AT [facility] Provider Note   CSN: [id number] Arrival date & time: 03/24/23  2248     History  Chief Complaint  Patient presents with   Back Pain    Katie Spencer is a 42 y.o. female.  42 year old female with history of chronic back pain, awaiting treatment with pain management, presents with complaint of acute on chronic lower back pain. Patient states she went down a water slide today, was going fast when the slide turned and twisted her body. Patient reports worsening pain since that time. No relief with Flexeril. Denies abdominal pain, fevers, leg weakness, loss of bowel or bladder control. No other complaints or concerns.        Home Medications Prior to Admission medications   Medication Sig Start Date End Date Taking? Authorizing Provider  diclofenac (VOLTAREN) 50 MG EC tablet Take 1 tablet (50 mg total) by mouth 2 (two) times daily for 10 days. 03/25/23 04/04/23 Yes Jeannie Fend, PA-C  methocarbamol (ROBAXIN) 500 MG tablet Take 1 tablet (500 mg total) by mouth 2 (two) times daily. 03/25/23  Yes Jeannie Fend, PA-C  predniSONE (STERAPRED UNI-PAK 21 TAB) 10 MG (21) TBPK tablet Take by mouth daily. Take 6 tabs by mouth daily  for 2 days, then 5 tabs for 2 days, then 4 tabs for 2 days, then 3 tabs for 2 days, 2 tabs for 2 days, then 1 tab by mouth daily for 2 days 03/25/23  Yes Eulah Pont Gerome Apley, PA-C  ABILIFY MAINTENA 400 MG SRER injection Inject 400 mg into the muscle every 28 (twenty-eight) days. 06/18/22   [provider]  butalbital-acetaminophen-caffeine (FIORICET) 50-325-40 MG tablet Take 1 tablet by mouth 2 (two) times daily as needed for headache.    [provider]  citalopram (CELEXA) 40 MG tablet Take 40 mg by mouth daily. 01/01/20   [provider]  dicyclomine (BENTYL) 20 MG tablet Take 1 tablet (20 mg total) by mouth 2 (two) times daily. 03/10/23   Chase Caller, MD  gabapentin (NEURONTIN) 600  MG tablet Take 600 mg by mouth 3 (three) times daily. 05/19/22   [provider]  HYDROcodone-acetaminophen (NORCO/VICODIN) 5-325 MG tablet Take 1 tablet by mouth 3 (three) times daily as needed for moderate pain or severe pain. 07/09/22   [provider]  hydrOXYzine (ATARAX/VISTARIL) 25 MG tablet Take 25 mg by mouth 3 (three) times daily as needed for anxiety.  04/30/20   [provider]  loperamide (IMODIUM) 2 MG capsule Take 1 capsule (2 mg total) by mouth 4 (four) times daily as needed for diarrhea or loose stools. 11/22/21   Redwine, Madison A, PA-C  omeprazole (PRILOSEC) 40 MG capsule Take 1 capsule (40 mg total) by mouth daily as needed (indigestion). 03/25/23   Jeannie Fend, PA-C  ondansetron (ZOFRAN) 4 MG tablet Take 1 tablet (4 mg total) by mouth every 6 (six) hours. 10/03/22   Ernie Avena, MD  ondansetron (ZOFRAN-ODT) 4 MG disintegrating tablet Take 1 tablet (4 mg total) by mouth every 8 (eight) hours as needed for nausea or vomiting. 03/10/23   Chase Caller, MD  pantoprazole (PROTONIX) 40 MG tablet Take 1 tablet (40 mg total) by mouth 2 (two) times daily. 11/22/21 12/22/21  Redwine, Madison A, PA-C  potassium chloride SA (KLOR-CON M) 20 MEQ tablet Take 1 tablet (20 mEq total) by mouth 2 (two) times daily. 08/21/22   Dione Booze, MD  SUMAtriptan (IMITREX) 100  MG tablet Take 100 mg by mouth every 2 (two) hours as needed for migraine. 02/09/22   [provider]  belladonna-opium (B&O SUPPRETTES) 16.2-30 MG suppository Place 1 suppository rectally every 8 (eight) hours as needed for pain. Patient not taking: Reported on 05/27/2020 05/20/20 08/18/20  Gilda Crease, MD  lansoprazole (PREVACID) 30 MG capsule Take 1 capsule daily while taking ibuprofen. Patient not taking: Reported on 05/27/2020 04/16/20 08/18/20  Molpus, Jonny Ruiz, MD      Allergies    Morphine and codeine, Almond (diagnostic), and Nsaids    Review of Systems   Review of Systems Negative  except as per HPI Physical Exam Updated Vital Signs BP (!) 141/89   Pulse 83   Temp 98.2 F (36.8 C)   Resp 18   LMP 04/04/2020 Comment: neg preg test  SpO2 99%  Physical Exam Vitals and nursing note reviewed.  Constitutional:      General: She is not in acute distress.    Appearance: She is well-developed. She is not diaphoretic.  HENT:     Head: Normocephalic and atraumatic.  Pulmonary:     Effort: Pulmonary effort is normal.  Abdominal:     Palpations: Abdomen is soft.     Tenderness: There is no abdominal tenderness.  Musculoskeletal:        General: Tenderness present. No swelling.     Lumbar back: Tenderness present. No bony tenderness.       Back:     Right lower leg: No edema.     Left lower leg: No edema.  Skin:    General: Skin is warm and dry.     Findings: No erythema or rash.  Neurological:     Mental Status: She is alert and oriented to person, place, and time.     Sensory: No sensory deficit.     Motor: No weakness.     Gait: Gait normal.     Deep Tendon Reflexes: Babinski sign absent on the right side. Babinski sign absent on the left side.     Reflex Scores:      Patellar reflexes are 1+ on the right side and 1+ on the left side. Psychiatric:        Behavior: Behavior normal.     ED Results / Procedures / Treatments   Labs (all labs ordered are listed, but only abnormal results are displayed) Labs Reviewed - No data to display  EKG None  Radiology DG Lumbar Spine Complete  Result Date: 03/24/2023 CLINICAL DATA:  Low back pain EXAM: LUMBAR SPINE - COMPLETE 4+ VIEW COMPARISON:  05/09/2022 FINDINGS: There is no evidence of lumbar spine fracture. Alignment is normal. Intervertebral disc spaces are maintained. IMPRESSION: Negative. Electronically Signed   By: Deatra Robinson M.D.   On: 03/24/2023 23:24    Procedures Procedures    Medications Ordered in ED Medications  methylPREDNISolone acetate (DEPO-MEDROL) injection 40 mg (has no  administration in time range)  lidocaine (LIDODERM) 5 % 1 patch (has no administration in time range)  ketorolac (TORADOL) 15 MG/ML injection 15 mg (15 mg Intramuscular Given 03/25/23 0155)    ED Course/ Medical Decision Making/ A&P                             Medical Decision Making Amount and/or Complexity of Data Reviewed Radiology: ordered.  Risk Prescription drug management.   42 yo female with acute on chronic low back pain after water  slide incident today. No red flags. Has TTP right and left paraspinous tenderness without bony tenderness. XR lumbar spine without acute bony injury, agree with radiologist interpretation. Plan is for IM Depomedrol and Toradol today. Will dc with prednisone taper and diclofenac, robaxin. Warm compresses and gentle stretching at home with plan to follow up with PCP. Provided with Lidoderm in the ER.         Final Clinical Impression(s) / ED Diagnoses Final diagnoses:  Strain of lumbar region, initial encounter    Rx / DC Orders ED Discharge Orders          Ordered    predniSONE (STERAPRED UNI-PAK 21 TAB) 10 MG (21) TBPK tablet  Daily        03/25/23 0115    methocarbamol (ROBAXIN) 500 MG tablet  2 times daily        03/25/23 0115    diclofenac (VOLTAREN) 50 MG EC tablet  2 times daily        03/25/23 0115    omeprazole (PRILOSEC) 40 MG capsule  Daily PRN        03/25/23 0115              Jeannie Fend, PA-C 03/25/23 0157    Gilda Crease, MD 03/25/23 [phone]

## 2023-03-25 NOTE — Discharge Instructions (Addendum)
Take prednisone as prescribed, start on Monday. Take Robaxin and Diclofenac as needed as directed. Do not take Robaxin if driving.  Warm compresses for 20 minutes at a time, follow with gentle stretching. Recheck with your PCP.

## 2023-04-03 DIAGNOSIS — Z79890 Hormone replacement therapy (postmenopausal): Secondary | ICD-10-CM

## 2023-05-11 ENCOUNTER — Other Ambulatory Visit: Payer: Self-pay

## 2023-05-11 ENCOUNTER — Emergency Department (HOSPITAL_BASED_OUTPATIENT_CLINIC_OR_DEPARTMENT_OTHER): Payer: MEDICAID

## 2023-05-11 ENCOUNTER — Encounter (HOSPITAL_BASED_OUTPATIENT_CLINIC_OR_DEPARTMENT_OTHER): Payer: Self-pay | Admitting: Emergency Medicine

## 2023-05-11 ENCOUNTER — Emergency Department (HOSPITAL_BASED_OUTPATIENT_CLINIC_OR_DEPARTMENT_OTHER)
Admission: EM | Admit: 2023-05-11 | Discharge: 2023-05-11 | Disposition: A | Discharge status: Home / Self Care | Payer: MEDICAID | Attending: Emergency Medicine | Admitting: Emergency Medicine

## 2023-05-11 DIAGNOSIS — R930 Abnormal findings on diagnostic imaging of skull and head, not elsewhere classified: Secondary | ICD-10-CM | POA: Diagnosis not present

## 2023-05-11 DIAGNOSIS — R6884 Jaw pain: Secondary | ICD-10-CM | POA: Diagnosis present

## 2023-05-11 MED ORDER — FENTANYL CITRATE PF 50 MCG/ML IJ SOSY
100.0000 ug | PREFILLED_SYRINGE | Freq: Once | INTRAMUSCULAR | Status: AC
  Administered 2023-05-11: 100 ug via INTRAMUSCULAR
  Filled 2023-05-11: qty 2

## 2023-05-11 NOTE — ED Provider Notes (Signed)
Katie Spencer Provider Note   CSN: [id number] Arrival date & time: 05/11/23  1903     History Chief Complaint  Patient presents with   Facial Injury    HPI Katie Spencer is a 42 y.o. female presenting for facial injury from softball to face.   Patient's recorded medical, surgical, social, medication list and allergies were reviewed in the Snapshot window as part of the initial history.   Review of Systems   Review of Systems  Constitutional:  Negative for chills and fever.  HENT:  Negative for ear pain and sore throat.   Eyes:  Negative for pain and visual disturbance.  Respiratory:  Negative for cough and shortness of breath.   Cardiovascular:  Negative for chest pain and palpitations.  Gastrointestinal:  Negative for abdominal pain and vomiting.  Genitourinary:  Negative for dysuria and hematuria.  Musculoskeletal:  Negative for arthralgias and back pain.  Skin:  Negative for color change and rash.  Neurological:  Negative for seizures and syncope.  All other systems reviewed and are negative.   Physical Exam Updated Vital Signs BP (!) 135/90 (BP Location: Left Arm)   Pulse (!) 101   Temp 98.4 F (36.9 C)   Resp 18   Ht 5\' 4"  (1.626 m)   Wt 90.7 kg   LMP 04/04/2020 Comment: neg preg test  SpO2 100%   BMI 34.33 kg/m  Physical Exam Vitals and nursing note reviewed.  Constitutional:      General: She is not in acute distress.    Appearance: She is well-developed.  HENT:     Head: Normocephalic and atraumatic.  Eyes:     Conjunctiva/sclera: Conjunctivae normal.  Cardiovascular:     Rate and Rhythm: Normal rate and regular rhythm.     Heart sounds: No murmur heard. Pulmonary:     Effort: Pulmonary effort is normal. No respiratory distress.     Breath sounds: Normal breath sounds.  Abdominal:     General: There is no distension.     Palpations: Abdomen is soft.     Tenderness: There is no abdominal  tenderness. There is no right CVA tenderness or left CVA tenderness.  Musculoskeletal:        General: Tenderness (No obvious deformity but patient with severe exquisite tenderness palpation of the jawline and chin.) present. No swelling. Normal range of motion.     Cervical back: Neck supple.  Skin:    General: Skin is warm and dry.  Neurological:     General: No focal deficit present.     Mental Status: She is alert and oriented to person, place, and time. Mental status is at baseline.     Cranial Nerves: No cranial nerve deficit.      ED Course/ Medical Decision Making/ A&P    Procedures Procedures   Medications Ordered in ED Medications  fentaNYL (SUBLIMAZE) injection 100 mcg (100 mcg Intramuscular Given 05/11/23 1955)   Medical Decision Making:    Katie Spencer is a 42 y.o. female who presented to the ED today with a moderate mechanisma trauma, detailed above.    Patient placed on continuous vitals and telemetry monitoring while in ED which was reviewed periodically.   Given this mechanism of trauma, a full physical exam was performed. Notably, patient was HDS in NAD.   Reviewed and confirmed nursing documentation for past medical history, family history, social history.    Initial Assessment/Plan:   This is a patient presenting with  a moderate mechanism trauma.  As such, I have considered intracranial injuries including intracranial hemorrhage, intrathoracic injuries including blunt myocardial or blunt lung injury, blunt abdominal injuries including aortic dissection, bladder injury, spleen injury, liver injury and I have considered orthopedic injuries including extremity or spinal injury.  With the patient's presentation of moderate mechanism trauma but an otherwise reassuring exam, patient warrants targeted evaluation for potential traumatic injuries. Will proceed with targeted evaluation for potential injuries. Will proceed with CT face and Head. Objective evaluation  resulted with NAA.    Final Reassessment and Plan:   Symptoms controlled on reassessment no acute distress. Will refer back to outpatient care with her PCP for reassessment.  Likely musculoskeletal injury without obvious fracture or deformity.   Disposition:  I have considered need for hospitalization, however, considering all of the above, I believe this patient is stable for discharge at this time.  Patient/family educated about specific return precautions for given chief complaint and symptoms.  Patient/family educated about follow-up with PCP.     Patient/family expressed understanding of return precautions and need for follow-up. Patient spoken to regarding all imaging and laboratory results and appropriate follow up for these results. All education provided in verbal form with additional information in written form. Time was allowed for answering of patient questions. Patient discharged.    Emergency Department Medication Summary:   Medications  fentaNYL (SUBLIMAZE) injection 100 mcg (100 mcg Intramuscular Given 05/11/23 1955)    Clinical Impression:  1. Jaw pain      Discharge   Final Clinical Impression(s) / ED Diagnoses Final diagnoses:  Jaw pain    Rx / DC Orders ED Discharge Orders     None         Glyn Ade, MD 05/11/23 2129

## 2023-05-11 NOTE — ED Notes (Signed)
Patient resting quietly in stretcher, respirations even, unlabored, no acute distress noted. Provided patient with ice pack. Patient aware she is waiting for CT scan.

## 2023-05-11 NOTE — ED Triage Notes (Signed)
Pt POV reports being struck in face by fast pitched softball.   Pt gestures to chin when asked where it hurts.

## 2023-05-26 ENCOUNTER — Encounter (HOSPITAL_BASED_OUTPATIENT_CLINIC_OR_DEPARTMENT_OTHER): Payer: Self-pay

## 2023-05-26 ENCOUNTER — Emergency Department (HOSPITAL_BASED_OUTPATIENT_CLINIC_OR_DEPARTMENT_OTHER)
Admission: EM | Admit: 2023-05-26 | Discharge: 2023-05-26 | Disposition: A | Discharge status: Home / Self Care | Payer: MEDICAID | Attending: Emergency Medicine | Admitting: Emergency Medicine

## 2023-05-26 ENCOUNTER — Other Ambulatory Visit: Payer: Self-pay

## 2023-05-26 DIAGNOSIS — G8929 Other chronic pain: Secondary | ICD-10-CM

## 2023-05-26 DIAGNOSIS — M545 Low back pain, unspecified: Secondary | ICD-10-CM | POA: Insufficient documentation

## 2023-05-26 MED ORDER — HYDROCODONE-ACETAMINOPHEN 5-325 MG PO TABS
1.0000 | ORAL_TABLET | Freq: Once | ORAL | Status: AC
  Administered 2023-05-26: 1 via ORAL
  Filled 2023-05-26: qty 1

## 2023-05-26 NOTE — ED Triage Notes (Signed)
Waiting for prior authorization for her pain meds to be filled.  Pain is in lower back and both hips.  Took Motrin, tylenol and biofreeze with no relief.

## 2023-05-26 NOTE — ED Provider Notes (Signed)
[place] EMERGENCY DEPARTMENT AT [facility] [place] Provider Note   CSN: [id number] Arrival date & time: 05/26/23  2124     History  No chief complaint on file.   Katie Spencer is a 42 y.o. female.  The history is provided by the patient and medical records. No language interpreter was used.     42 year old female significant history of chronic abdominal pain and chronic back pain with prior abdominal hysterectomy and cholecystectomy presenting to ED with complaints of hip pain and back pain.  Patient states she normally takes hydrocodone for pain.  She does follow-up with pain specialist.  She was last seen by her pain specialist 3 days ago for medication refill however due to insurance not doing prior authorization in a timely manner she is without her chronic pain medication.  She endorsed having lower back pain radiates to both of her hips worse with movement but present at rest similar to her chronic back pain.  She tries to take Tylenol and alternate with Motrin as well as using he denies without adequate relief prompting this ER visit.  She does not endorse any fever no urinary symptoms no numbness weakness no bowel bladder incontinence no saddle anesthesia.  No recent injury.  No history of IV drug use or active cancer.  Patient was seen on 05/21/2023 for similar presentation.  A CT scan of abdomen pelvis was obtained at that time without any acute finding no evidence of kidney stone or other concerning pathology.  Home Medications Prior to Admission medications   Medication Sig Start Date End Date Taking? Authorizing Provider  ABILIFY MAINTENA 400 MG SRER injection Inject 400 mg into the muscle every 28 (twenty-eight) days. 06/18/22   [provider]  butalbital-acetaminophen-caffeine (FIORICET) 50-325-40 MG tablet Take 1 tablet by mouth 2 (two) times daily as needed for headache.    [provider]  citalopram (CELEXA) 40 MG tablet Take 40 mg by mouth  daily. 01/01/20   [provider]  dicyclomine (BENTYL) 20 MG tablet Take 1 tablet (20 mg total) by mouth 2 (two) times daily. 03/10/23   Chase Caller, MD  gabapentin (NEURONTIN) 600 MG tablet Take 600 mg by mouth 3 (three) times daily. 05/19/22   [provider]  HYDROcodone-acetaminophen (NORCO/VICODIN) 5-325 MG tablet Take 1 tablet by mouth 3 (three) times daily as needed for moderate pain or severe pain. 07/09/22   [provider]  hydrOXYzine (ATARAX/VISTARIL) 25 MG tablet Take 25 mg by mouth 3 (three) times daily as needed for anxiety.  04/30/20   [provider]  loperamide (IMODIUM) 2 MG capsule Take 1 capsule (2 mg total) by mouth 4 (four) times daily as needed for diarrhea or loose stools. 11/22/21   Redwine, Madison A, PA-C  methocarbamol (ROBAXIN) 500 MG tablet Take 1 tablet (500 mg total) by mouth 2 (two) times daily. 03/25/23   Jeannie Fend, PA-C  omeprazole (PRILOSEC) 40 MG capsule Take 1 capsule (40 mg total) by mouth daily as needed (indigestion). 03/25/23   Jeannie Fend, PA-C  ondansetron (ZOFRAN) 4 MG tablet Take 1 tablet (4 mg total) by mouth every 6 (six) hours. 10/03/22   Ernie Avena, MD  ondansetron (ZOFRAN-ODT) 4 MG disintegrating tablet Take 1 tablet (4 mg total) by mouth every 8 (eight) hours as needed for nausea or vomiting. 03/10/23   Chase Caller, MD  pantoprazole (PROTONIX) 40 MG tablet Take 1 tablet (40 mg total) by mouth 2 (two) times daily. 11/22/21  12/22/21  Redwine, Madison A, PA-C  potassium chloride SA (KLOR-CON M) 20 MEQ tablet Take 1 tablet (20 mEq total) by mouth 2 (two) times daily. 08/21/22   Dione Booze, MD  predniSONE (STERAPRED UNI-PAK 21 TAB) 10 MG (21) TBPK tablet Take by mouth daily. Take 6 tabs by mouth daily  for 2 days, then 5 tabs for 2 days, then 4 tabs for 2 days, then 3 tabs for 2 days, 2 tabs for 2 days, then 1 tab by mouth daily for 2 days 03/25/23   Jeannie Fend, PA-C  SUMAtriptan (IMITREX) 100 MG  tablet Take 100 mg by mouth every 2 (two) hours as needed for migraine. 02/09/22   [provider]  belladonna-opium (B&O SUPPRETTES) 16.2-30 MG suppository Place 1 suppository rectally every 8 (eight) hours as needed for pain. Patient not taking: Reported on 05/27/2020 05/20/20 08/18/20  Gilda Crease, MD  lansoprazole (PREVACID) 30 MG capsule Take 1 capsule daily while taking ibuprofen. Patient not taking: Reported on 05/27/2020 04/16/20 08/18/20  Molpus, Jonny Ruiz, MD      Allergies    Morphine and codeine, Almond (diagnostic), and Nsaids    Review of Systems   Review of Systems  All other systems reviewed and are negative.   Physical Exam Updated Vital Signs BP 128/71 (BP Location: Right Arm)   Pulse 74   Temp 98.3 F (36.8 C)   Resp 16   LMP 04/04/2020 Comment: neg preg test  SpO2 99%  Physical Exam Vitals and nursing note reviewed.  Constitutional:      General: She is not in acute distress.    Appearance: She is well-developed.  HENT:     Head: Atraumatic.  Eyes:     Conjunctiva/sclera: Conjunctivae normal.  Pulmonary:     Effort: Pulmonary effort is normal.  Abdominal:     Palpations: Abdomen is soft.     Tenderness: There is no abdominal tenderness.  Musculoskeletal:        General: Tenderness (Mild tenderness lumbar on bilateral lumbar paraspinal muscles on palpation.  Hips stable with full range of motion.  No overlying skin changes.) present.     Cervical back: Neck supple.  Skin:    Findings: No rash.  Neurological:     Mental Status: She is alert.  Psychiatric:        Mood and Affect: Mood normal.     ED Results / Procedures / Treatments   Labs (all labs ordered are listed, but only abnormal results are displayed) Labs Reviewed - No data to display  EKG None  Radiology No results found.  Procedures Procedures    Medications Ordered in ED Medications - No data to display  ED Course/ Medical Decision Making/ A&P                                  Medical Decision Making  BP 128/71 (BP Location: Right Arm)   Pulse 74   Temp 98.3 F (36.8 C)   Resp 16   Ht 5\' 4"  (1.626 m)   Wt 89.4 kg   LMP 04/04/2020 Comment: neg preg test  SpO2 100%   BMI 33.81 kg/m   63:33 PM 42 year old female significant history of chronic abdominal pain and chronic back pain with prior abdominal hysterectomy and cholecystectomy presenting to ED with complaints of hip pain and back pain.  Patient states she normally takes hydrocodone for pain.  She does  follow-up with pain specialist.  She was last seen by her pain specialist 3 days ago for medication refill however due to insurance not doing prior authorization in a timely manner she is without her chronic pain medication.  She endorsed having lower back pain radiates to both of her hips worse with movement but present at rest similar to her chronic back pain.  She tries to take Tylenol and alternate with Motrin as well as using he denies without adequate relief prompting this ER visit.  She does not endorse any fever no urinary symptoms no numbness weakness no bowel bladder incontinence no saddle anesthesia.  No recent injury.  No history of IV drug use or active cancer.  Patient was seen on 05/21/2023 for similar presentation.  A CT scan of abdomen pelvis was obtained at that time without any acute finding no evidence of kidney stone or other concerning pathology.  On exam, patient is sitting in the bed appears to be in no acute discomfort.  She does have some mild tenderness to her lumbar and bilateral paralumbar spinal muscles and bilateral hip with full range of motion and no signs of infection noted.  This is likely acute on chronic lower back pain.  Doubt sciatica, doubt cauda equina, doubt infectious etiology.  EMR reviewed patient was seen in the ED several days ago for back pain and has had CT scan of abdomen pelvis without any acute finding.  I have also reviewed PDMP.  Patient will receive 1  dose of her home pain medication and she should follow-up with her pain management for further care.  Patient voiced understanding and agrees with plan.  Does not think additional imaging or labs indicated at this time.  Improvement of symptoms after receiving pain medication.  She's able to ambulate and is stable to be discharged home.        Final Clinical Impression(s) / ED Diagnoses Final diagnoses:  Chronic bilateral low back pain without sciatica    Rx / DC Orders ED Discharge Orders     None         Fayrene Helper, PA-C 05/26/23 2307    Melene Plan, DO 05/26/23 2311

## 2023-07-20 ENCOUNTER — Emergency Department (HOSPITAL_BASED_OUTPATIENT_CLINIC_OR_DEPARTMENT_OTHER): Payer: MEDICAID

## 2023-07-20 ENCOUNTER — Emergency Department (HOSPITAL_BASED_OUTPATIENT_CLINIC_OR_DEPARTMENT_OTHER)
Admission: EM | Admit: 2023-07-20 | Discharge: 2023-07-20 | Disposition: A | Discharge status: Home / Self Care | Payer: MEDICAID | Attending: Emergency Medicine | Admitting: Emergency Medicine

## 2023-07-20 ENCOUNTER — Other Ambulatory Visit: Payer: Self-pay

## 2023-07-20 ENCOUNTER — Encounter (HOSPITAL_BASED_OUTPATIENT_CLINIC_OR_DEPARTMENT_OTHER): Payer: Self-pay | Admitting: *Deleted

## 2023-07-20 DIAGNOSIS — K648 Other hemorrhoids: Secondary | ICD-10-CM | POA: Insufficient documentation

## 2023-07-20 DIAGNOSIS — K6289 Other specified diseases of anus and rectum: Secondary | ICD-10-CM | POA: Diagnosis present

## 2023-07-20 DIAGNOSIS — K5900 Constipation, unspecified: Secondary | ICD-10-CM | POA: Diagnosis not present

## 2023-07-20 LAB — CBC
HCT: 38.2 % (ref 36.0–46.0)
Hemoglobin: 13.5 g/dL (ref 12.0–15.0)
MCH: 31.8 pg (ref 26.0–34.0)
MCHC: 35.3 g/dL (ref 30.0–36.0)
MCV: 90.1 fL (ref 80.0–100.0)
Platelets: 250 10*3/uL (ref 150–400)
RBC: 4.24 MIL/uL (ref 3.87–5.11)
RDW: 12.1 % (ref 11.5–15.5)
WBC: 9.6 10*3/uL (ref 4.0–10.5)
nRBC: 0 % (ref 0.0–0.2)

## 2023-07-20 LAB — BASIC METABOLIC PANEL
Anion gap: 11 (ref 5–15)
BUN: 13 mg/dL (ref 6–20)
CO2: 22 mmol/L (ref 22–32)
Calcium: 9 mg/dL (ref 8.9–10.3)
Chloride: 102 mmol/L (ref 98–111)
Creatinine, Ser: 0.9 mg/dL (ref 0.44–1.00)
GFR, Estimated: >60 mL/min (ref >60)
Glucose, Bld: 255 mg/dL — ABNORMAL HIGH (ref 70–99)
Potassium: 3.5 mmol/L (ref 3.5–5.1)
Sodium: 135 mmol/L (ref 135–145)

## 2023-07-20 LAB — PREGNANCY, URINE: Preg Test, Ur: NEGATIVE

## 2023-07-20 MED ORDER — FENTANYL CITRATE PF 50 MCG/ML IJ SOSY
50.0000 ug | PREFILLED_SYRINGE | Freq: Once | INTRAMUSCULAR | Status: AC
  Administered 2023-07-20: 50 ug via INTRAVENOUS
  Filled 2023-07-20: qty 1

## 2023-07-20 MED ORDER — HYDROMORPHONE HCL 1 MG/ML IJ SOLN
1.0000 mg | Freq: Once | INTRAMUSCULAR | Status: AC
  Administered 2023-07-20: 1 mg via INTRAVENOUS
  Filled 2023-07-20: qty 1

## 2023-07-20 MED ORDER — LIDOCAINE HCL URETHRAL/MUCOSAL 2 % EX GEL
1.0000 | Freq: Once | CUTANEOUS | Status: AC
  Administered 2023-07-20: 1 via TOPICAL
  Filled 2023-07-20: qty 11

## 2023-07-20 MED ORDER — IOHEXOL 300 MG/ML  SOLN
100.0000 mL | Freq: Once | INTRAMUSCULAR | Status: AC | PRN
  Administered 2023-07-20: 100 mL via INTRAVENOUS

## 2023-07-20 MED ORDER — HYDROCORTISONE ACETATE 25 MG RE SUPP: 25.0000 mg | Freq: Two times a day (BID) | RECTAL | 0 refills | Status: DC

## 2023-07-20 NOTE — Discharge Instructions (Signed)
You may have small internal hemorrhoids from the constipation.  Please use Anusol suppository twice daily  Please continue taking MiraLAX  Please call your GI doctor for follow-up  Return to ER if you have severe abdominal pain or rectal pain or vomiting or fever

## 2023-07-20 NOTE — ED Triage Notes (Signed)
Pt is having rectal pain which has been increasing over the past two weeks.  Pt states that she does not have hemorrhoids.  Pt states that she was seen at [facility] today and they wanted her to come here for further evaluation and to rule out a peri rectal abscess, they did not assess her rectum or do any rectal exam. No fever or chills

## 2023-07-20 NOTE — ED Provider Notes (Signed)
[place] [facility] Provider Note   CSN: [id number] Arrival date & time: 07/20/23  1745     History  Chief Complaint  Patient presents with   Rectal Pain    Katie Spencer is a 42 y.o. female history of chronic pain, here presenting with buttock pain.  Patient has been constipated for about 2 weeks.  Patient went to urgent care was noted to be constipated.  Patient had x-ray that showed constipation.  Patient was sent here for CT scan.  There was a concern for possible perirectal abscess but no rectal exam was performed.  Patient has no history of IBD.  Patient takes hydrocodone at baseline  The history is provided by the patient.       Home Medications Prior to Admission medications   Medication Sig Start Date End Date Taking? Authorizing Provider  ABILIFY MAINTENA 400 MG SRER injection Inject 400 mg into the muscle every 28 (twenty-eight) days. 06/18/22   [provider]  butalbital-acetaminophen-caffeine (FIORICET) 50-325-40 MG tablet Take 1 tablet by mouth 2 (two) times daily as needed for headache.    [provider]  citalopram (CELEXA) 40 MG tablet Take 40 mg by mouth daily. 01/01/20   [provider]  dicyclomine (BENTYL) 20 MG tablet Take 1 tablet (20 mg total) by mouth 2 (two) times daily. 03/10/23   Chase Caller, MD  gabapentin (NEURONTIN) 600 MG tablet Take 600 mg by mouth 3 (three) times daily. 05/19/22   [provider]  HYDROcodone-acetaminophen (NORCO/VICODIN) 5-325 MG tablet Take 1 tablet by mouth 3 (three) times daily as needed for moderate pain or severe pain. 07/09/22   [provider]  hydrOXYzine (ATARAX/VISTARIL) 25 MG tablet Take 25 mg by mouth 3 (three) times daily as needed for anxiety.  04/30/20   [provider]  loperamide (IMODIUM) 2 MG capsule Take 1 capsule (2 mg total) by mouth 4 (four) times daily as needed for diarrhea or loose stools. 11/22/21   Redwine,  Madison A, PA-C  methocarbamol (ROBAXIN) 500 MG tablet Take 1 tablet (500 mg total) by mouth 2 (two) times daily. 03/25/23   Jeannie Fend, PA-C  omeprazole (PRILOSEC) 40 MG capsule Take 1 capsule (40 mg total) by mouth daily as needed (indigestion). 03/25/23   Jeannie Fend, PA-C  ondansetron (ZOFRAN) 4 MG tablet Take 1 tablet (4 mg total) by mouth every 6 (six) hours. 10/03/22   Ernie Avena, MD  ondansetron (ZOFRAN-ODT) 4 MG disintegrating tablet Take 1 tablet (4 mg total) by mouth every 8 (eight) hours as needed for nausea or vomiting. 03/10/23   Chase Caller, MD  pantoprazole (PROTONIX) 40 MG tablet Take 1 tablet (40 mg total) by mouth 2 (two) times daily. 11/22/21 12/22/21  Redwine, Madison A, PA-C  potassium chloride SA (KLOR-CON M) 20 MEQ tablet Take 1 tablet (20 mEq total) by mouth 2 (two) times daily. 08/21/22   Dione Booze, MD  predniSONE (STERAPRED UNI-PAK 21 TAB) 10 MG (21) TBPK tablet Take by mouth daily. Take 6 tabs by mouth daily  for 2 days, then 5 tabs for 2 days, then 4 tabs for 2 days, then 3 tabs for 2 days, 2 tabs for 2 days, then 1 tab by mouth daily for 2 days 03/25/23   Jeannie Fend, PA-C  SUMAtriptan (IMITREX) 100 MG tablet Take 100 mg by mouth every 2 (two) hours as needed for migraine. 02/09/22   [provider]  belladonna-opium (B&O  SUPPRETTES) 16.2-30 MG suppository Place 1 suppository rectally every 8 (eight) hours as needed for pain. Patient not taking: Reported on 05/27/2020 05/20/20 08/18/20  Gilda Crease, MD  lansoprazole (PREVACID) 30 MG capsule Take 1 capsule daily while taking ibuprofen. Patient not taking: Reported on 05/27/2020 04/16/20 08/18/20  Molpus, John, MD      Allergies    Morphine and codeine, Almond (diagnostic), and Nsaids    Review of Systems   Review of Systems  Gastrointestinal:  Positive for abdominal pain and rectal pain.  All other systems reviewed and are negative.   Physical Exam Updated Vital Signs BP 106/66   Pulse  94   Temp 98.5 F (36.9 C) (Oral)   Resp 15   LMP 04/04/2020 Comment: neg preg test  SpO2 96%  Physical Exam Vitals and nursing note reviewed.  Constitutional:      Comments: Uncomfortable  HENT:     Head: Normocephalic.     Nose: Nose normal.     Mouth/Throat:     Mouth: Mucous membranes are dry.  Eyes:     Extraocular Movements: Extraocular movements intact.     Pupils: Pupils are equal, round, and reactive to light.  Cardiovascular:     Rate and Rhythm: Normal rate and regular rhythm.     Pulses: Normal pulses.     Heart sounds: Normal heart sounds.  Pulmonary:     Effort: Pulmonary effort is normal.     Breath sounds: Normal breath sounds.  Abdominal:     General: Abdomen is flat.     Palpations: Abdomen is soft.     Comments: Mild left lower quadrant tenderness  Genitourinary:    Comments: Rectal-no obvious perirectal abscess or hemorrhoids. Musculoskeletal:        General: Normal range of motion.     Cervical back: Normal range of motion and neck supple.  Skin:    General: Skin is warm.     Capillary Refill: Capillary refill takes less than 2 seconds.  Neurological:     General: No focal deficit present.     Mental Status: She is oriented to person, place, and time.  Psychiatric:        Mood and Affect: Mood normal.        Behavior: Behavior normal.     ED Results / Procedures / Treatments   Labs (all labs ordered are listed, but only abnormal results are displayed) Labs Reviewed  BASIC METABOLIC PANEL - Abnormal; Notable for the following components:      Result Value   Glucose, Bld 255 (*)    All other components within normal limits  CBC  PREGNANCY, URINE    EKG None  Radiology No results found.  Procedures Procedures    Medications Ordered in ED Medications  fentaNYL (SUBLIMAZE) injection 50 mcg (50 mcg Intravenous Given 07/20/23 1934)  HYDROmorphone (DILAUDID) injection 1 mg (1 mg Intravenous Given 07/20/23 2021)  lidocaine (XYLOCAINE)  2 % jelly 1 Application (1 Application Topical Given 07/20/23 2017)  iohexol (OMNIPAQUE) 300 MG/ML solution 100 mL (100 mLs Intravenous Contrast Given 07/20/23 2026)    ED Course/ Medical Decision Making/ A&P                                 Medical Decision Making Katie Spencer is a 42 y.o. female here presenting with rectal pain.  I do not appreciate any obvious rectal abscess or hemorrhoids.  Consider deeper abscess versus colitis versus diverticulitis.  Plan to get CBC and CMP and CT abdomen pelvis.  10:13 PM I reviewed labs and they were unremarkable.  CT abdomen pelvis is unremarkable.  She was given viscous lidocaine through her rectum and felt better.  I wonder if she has small internal hemorrhoids.  Patient does have a GI doctor.  Will start on Anusol cream and have her follow-up with GI doctor for possible sigmoidoscopy  Problems Addressed: Constipation, unspecified constipation type: acute illness or injury Internal hemorrhoid: acute illness or injury Rectal pain: acute illness or injury  Amount and/or Complexity of Data Reviewed Labs: ordered. Decision-making details documented in ED Course. Radiology: ordered and independent interpretation performed. Decision-making details documented in ED Course.  Risk Prescription drug management.    Final Clinical Impression(s) / ED Diagnoses Final diagnoses:  None    Rx / DC Orders ED Discharge Orders     None         Charlynne Pander, MD 07/20/23 2215

## 2023-07-31 ENCOUNTER — Emergency Department (HOSPITAL_BASED_OUTPATIENT_CLINIC_OR_DEPARTMENT_OTHER)
Admission: EM | Admit: 2023-07-31 | Discharge: 2023-07-31 | Disposition: A | Discharge status: Home / Self Care | Payer: MEDICAID | Attending: Emergency Medicine | Admitting: Emergency Medicine

## 2023-07-31 ENCOUNTER — Other Ambulatory Visit: Payer: Self-pay

## 2023-07-31 ENCOUNTER — Encounter (HOSPITAL_BASED_OUTPATIENT_CLINIC_OR_DEPARTMENT_OTHER): Payer: Self-pay | Admitting: Urology

## 2023-07-31 DIAGNOSIS — K0889 Other specified disorders of teeth and supporting structures: Secondary | ICD-10-CM | POA: Diagnosis present

## 2023-07-31 DIAGNOSIS — G8918 Other acute postprocedural pain: Secondary | ICD-10-CM | POA: Diagnosis not present

## 2023-07-31 NOTE — Discharge Instructions (Signed)
You were seen in the emergency room for wound check after dental surgery.  Your sutures are intact with no bleeding.  I suggest following up tomorrow morning with your surgeon and leaving sutures in place until you see him.  I would recommend alternating Tylenol and ibuprofen for pain control, as discussed you can take your Norco as needed for breakthrough pain.  Recommend regularly icing the area as well.  Return to emergency room with any new or worsening symptoms.

## 2023-07-31 NOTE — ED Triage Notes (Signed)
Right upper #5 tooth sx last week, has sutures after bone graft  States throbbing pain x 2 days  Denies any drainage, states sutures feel like they have unraveled  Has appointment to get them removed next Thursday   Take hydrocodone per normal rx with little relief

## 2023-07-31 NOTE — ED Notes (Signed)
Pt asking this RN if she can take her last dose of scheduled home hydrocodone since she is in so much pain. This RN informed pt that she should not take her home medications while in the ED due to the physician may prescribe her a medication for pain as well.

## 2023-07-31 NOTE — ED Notes (Signed)
Discharge paperwork reviewed entirely with patient, including follow up care. Pain was under control. No prescriptions were called in, but all questions were addressed.  Pt verbalized understanding as well as all parties involved. No questions or concerns voiced at the time of discharge. No acute distress noted.   Pt ambulated out to PVA without incident or assistance.  Pt advised they will notify their PCP immediately., Pt advised they will seek followup care with a specialist and followup with their PCP. , and Pt was given information to obtain and notify a PCP.

## 2023-07-31 NOTE — ED Provider Notes (Signed)
[place] [facility] Provider Note   CSN: [id number] Arrival date & time: 07/31/23  1853     History  Chief Complaint  Patient presents with   Dental Pain    Katie Spencer is a 42 y.o. female with past medical history of lumbar radiculopathy senting to emergency room with dental pain.  Patient reports that she had surgery 5 days ago in which she had a bone graft sutured to upper right fifth tooth.  Patient reports that she was eating and felt a tug on her sutures she is concerned that they have become unraveled.  Patient does not have noted swelling or increasing pain over the area.  Patient is taking hydrocodone for pain relief.  Denies fevers, chills.  Patient tolerating p.o.   Dental Pain      Home Medications Prior to Admission medications   Medication Sig Start Date End Date Taking? Authorizing Provider  ABILIFY MAINTENA 400 MG SRER injection Inject 400 mg into the muscle every 28 (twenty-eight) days. 06/18/22   [provider]  butalbital-acetaminophen-caffeine (FIORICET) 50-325-40 MG tablet Take 1 tablet by mouth 2 (two) times daily as needed for headache.    [provider]  citalopram (CELEXA) 40 MG tablet Take 40 mg by mouth daily. 01/01/20   [provider]  dicyclomine (BENTYL) 20 MG tablet Take 1 tablet (20 mg total) by mouth 2 (two) times daily. 03/10/23   Chase Caller, MD  gabapentin (NEURONTIN) 600 MG tablet Take 600 mg by mouth 3 (three) times daily. 05/19/22   [provider]  HYDROcodone-acetaminophen (NORCO/VICODIN) 5-325 MG tablet Take 1 tablet by mouth 3 (three) times daily as needed for moderate pain or severe pain. 07/09/22   [provider]  hydrocortisone (ANUSOL-HC) 25 MG suppository Place 1 suppository (25 mg total) rectally 2 (two) times daily. 07/20/23   Charlynne Pander, MD  hydrOXYzine (ATARAX/VISTARIL) 25 MG tablet Take 25 mg by mouth 3 (three) times daily as  needed for anxiety.  04/30/20   [provider]  loperamide (IMODIUM) 2 MG capsule Take 1 capsule (2 mg total) by mouth 4 (four) times daily as needed for diarrhea or loose stools. 11/22/21   Redwine, Madison A, PA-C  methocarbamol (ROBAXIN) 500 MG tablet Take 1 tablet (500 mg total) by mouth 2 (two) times daily. 03/25/23   Jeannie Fend, PA-C  omeprazole (PRILOSEC) 40 MG capsule Take 1 capsule (40 mg total) by mouth daily as needed (indigestion). 03/25/23   Jeannie Fend, PA-C  ondansetron (ZOFRAN) 4 MG tablet Take 1 tablet (4 mg total) by mouth every 6 (six) hours. 10/03/22   Ernie Avena, MD  ondansetron (ZOFRAN-ODT) 4 MG disintegrating tablet Take 1 tablet (4 mg total) by mouth every 8 (eight) hours as needed for nausea or vomiting. 03/10/23   Chase Caller, MD  pantoprazole (PROTONIX) 40 MG tablet Take 1 tablet (40 mg total) by mouth 2 (two) times daily. 11/22/21 12/22/21  Redwine, Madison A, PA-C  potassium chloride SA (KLOR-CON M) 20 MEQ tablet Take 1 tablet (20 mEq total) by mouth 2 (two) times daily. 08/21/22   Dione Booze, MD  predniSONE (STERAPRED UNI-PAK 21 TAB) 10 MG (21) TBPK tablet Take by mouth daily. Take 6 tabs by mouth daily  for 2 days, then 5 tabs for 2 days, then 4 tabs for 2 days, then 3 tabs for 2 days, 2 tabs for 2 days, then 1 tab by mouth daily for 2 days  03/25/23   Jeannie Fend, PA-C  SUMAtriptan (IMITREX) 100 MG tablet Take 100 mg by mouth every 2 (two) hours as needed for migraine. 02/09/22   [provider]  belladonna-opium (B&O SUPPRETTES) 16.2-30 MG suppository Place 1 suppository rectally every 8 (eight) hours as needed for pain. Patient not taking: Reported on 05/27/2020 05/20/20 08/18/20  Gilda Crease, MD  lansoprazole (PREVACID) 30 MG capsule Take 1 capsule daily while taking ibuprofen. Patient not taking: Reported on 05/27/2020 04/16/20 08/18/20  Molpus, Jonny Ruiz, MD      Allergies    Morphine and codeine, Almond (diagnostic), and Nsaids     Review of Systems   Review of Systems  HENT:  Positive for dental problem.     Physical Exam Updated Vital Signs BP 124/87 (BP Location: Left Arm)   Pulse 87   Temp 98 F (36.7 C)   Resp 18   Ht 5\' 4"  (1.626 m)   Wt 89.4 kg   LMP 04/04/2020 Comment: neg preg test  SpO2 100%   BMI 33.83 kg/m  Physical Exam Vitals and nursing note reviewed.  Constitutional:      General: She is not in acute distress.    Appearance: She is not toxic-appearing.  HENT:     Head: Normocephalic and atraumatic.  Eyes:     General: No scleral icterus.    Conjunctiva/sclera: Conjunctivae normal.  Cardiovascular:     Rate and Rhythm: Normal rate and regular rhythm.     Pulses: Normal pulses.     Heart sounds: Normal heart sounds.  Pulmonary:     Effort: Pulmonary effort is normal. No respiratory distress.     Breath sounds: Normal breath sounds.  Abdominal:     General: Abdomen is flat. Bowel sounds are normal.     Palpations: Abdomen is soft.     Tenderness: There is no abdominal tenderness.  Skin:    General: Skin is warm and dry.     Findings: No lesion.     Comments: Right upper tooth with sutures in place, none bleeding.   Neurological:     General: No focal deficit present.     Mental Status: She is alert and oriented to person, place, and time. Mental status is at baseline.     ED Results / Procedures / Treatments   Labs (all labs ordered are listed, but only abnormal results are displayed) Labs Reviewed - No data to display  EKG None  Radiology No results found.  Procedures Procedures    Medications Ordered in ED Medications - No data to display  ED Course/ Medical Decision Making/ A&P                                 Medical Decision Making   This patient presents to the ED for concern of dental pain, this involves an extensive number of treatment options, and is a complaint that carries with it a high risk of complications and morbidity.  The differential  diagnosis includes infection, abscess, surgical complication   Co morbidities that complicate the patient evaluation  None   Additional history obtained:  Unfortunately could not find procedure note from urgent tooth visit.    Lab Tests:  None    Imaging Studies ordered:  Not obtained   Cardiac Monitoring: / EKG:  The patient was maintained on a cardiac monitor.  Hemodynamically stable   Consultations Obtained:  None  Problem List / ED Course / Critical interventions / Medication management  Patient reporting to emergency room with postop complication.  Patient reports that she was eating earlier today when she felt her sutures and right upper molar became loose.  Patient has not noticed any bleeding.  On exam there is no obvious wound dehiscence, sutures are intact although they appear loose.  Patient does not have bleeding.  I will leave sutures in place recommend follow-up with dentistry.  Patient is overall well-appearing hemodynamically stable.  Patient reports he is tolerating p.o. recommended continuing with current pain management plan including Tylenol ibuprofen and hydrocodone for breakthrough pain.  Patient agrees and understands plan.  I have reviewed the patients home medicines and have made adjustments as needed   Plan  F/u w/ PCP in 2-3d to ensure resolution of sx.  Patient was given return precautions. Patient stable for discharge at this time.  Patient educated on sx/dx and verbalized understanding of plan. Return to ER w/ new or worsening sx.          Final Clinical Impression(s) / ED Diagnoses Final diagnoses:  Pain, dental    Rx / DC Orders ED Discharge Orders     None         Raford Pitcher, Evalee Jefferson 07/31/23 2045    Charlynne Pander, MD 07/31/23 2330

## 2023-11-01 ENCOUNTER — Encounter (HOSPITAL_COMMUNITY): Payer: Self-pay | Admitting: Emergency Medicine

## 2023-11-01 ENCOUNTER — Emergency Department (HOSPITAL_COMMUNITY): Payer: MEDICAID

## 2023-11-01 ENCOUNTER — Emergency Department (HOSPITAL_COMMUNITY)
Admission: EM | Admit: 2023-11-01 | Discharge: 2023-11-01 | Disposition: A | Discharge status: Home / Self Care | Payer: MEDICAID | Attending: Emergency Medicine | Admitting: Emergency Medicine

## 2023-11-01 ENCOUNTER — Other Ambulatory Visit: Payer: Self-pay

## 2023-11-01 DIAGNOSIS — R0789 Other chest pain: Secondary | ICD-10-CM | POA: Insufficient documentation

## 2023-11-01 LAB — CBC WITH DIFFERENTIAL/PLATELET
Abs Immature Granulocytes: 0.04 10*3/uL (ref 0.00–0.07)
Basophils Absolute: 0 10*3/uL (ref 0.0–0.1)
Basophils Relative: 0 %
Eosinophils Absolute: 0.1 10*3/uL (ref 0.0–0.5)
Eosinophils Relative: 1 %
HCT: 37.7 % (ref 36.0–46.0)
Hemoglobin: 12.9 g/dL (ref 12.0–15.0)
Immature Granulocytes: 0 %
Lymphocytes Relative: 19 %
Lymphs Abs: 1.8 10*3/uL (ref 0.7–4.0)
MCH: 32.3 pg (ref 26.0–34.0)
MCHC: 34.2 g/dL (ref 30.0–36.0)
MCV: 94.3 fL (ref 80.0–100.0)
Monocytes Absolute: 0.4 10*3/uL (ref 0.1–1.0)
Monocytes Relative: 4 %
Neutro Abs: 7 10*3/uL (ref 1.7–7.7)
Neutrophils Relative %: 76 %
Platelets: 274 10*3/uL (ref 150–400)
RBC: 4 MIL/uL (ref 3.87–5.11)
RDW: 13.2 % (ref 11.5–15.5)
WBC: 9.4 10*3/uL (ref 4.0–10.5)
nRBC: 0 % (ref 0.0–0.2)

## 2023-11-01 LAB — RESP PANEL BY RT-PCR (RSV, FLU A&B, COVID)  RVPGX2
Influenza A by PCR: NEGATIVE
Influenza B by PCR: NEGATIVE
Resp Syncytial Virus by PCR: NEGATIVE
SARS Coronavirus 2 by RT PCR: NEGATIVE

## 2023-11-01 LAB — COMPREHENSIVE METABOLIC PANEL
ALT: 33 U/L (ref 0–44)
AST: 35 U/L (ref 15–41)
Albumin: 4.1 g/dL (ref 3.5–5.0)
Alkaline Phosphatase: 94 U/L (ref 38–126)
Anion gap: 13 (ref 5–15)
BUN: 14 mg/dL (ref 6–20)
CO2: 21 mmol/L — ABNORMAL LOW (ref 22–32)
Calcium: 9.3 mg/dL (ref 8.9–10.3)
Chloride: 104 mmol/L (ref 98–111)
Creatinine, Ser: 0.78 mg/dL (ref 0.44–1.00)
GFR, Estimated: >60 mL/min (ref >60)
Glucose, Bld: 245 mg/dL — ABNORMAL HIGH (ref 70–99)
Potassium: 4 mmol/L (ref 3.5–5.1)
Sodium: 138 mmol/L (ref 135–145)
Total Bilirubin: 0.9 mg/dL (ref 0.0–1.2)
Total Protein: 7.8 g/dL (ref 6.5–8.1)

## 2023-11-01 LAB — D-DIMER, QUANTITATIVE: D-Dimer, Quant: 0.37 ug{FEU}/mL (ref 0.00–0.50)

## 2023-11-01 LAB — TROPONIN I (HIGH SENSITIVITY): Troponin I (High Sensitivity): 2 ng/L (ref <18)

## 2023-11-01 MED ORDER — PREDNISONE 50 MG PO TABS: ORAL_TABLET | ORAL | 0 refills | Status: DC

## 2023-11-01 NOTE — ED Triage Notes (Addendum)
Patient presents due to chest pain from her left arm pit to her nipple. She notices sharp shooting pains every few seconds. Pain began 2-4 weeks ago, first when taking her bra off, now they present even with her bra on. Patient states pain takes her breath away.

## 2023-11-01 NOTE — ED Provider Triage Note (Signed)
Emergency Medicine Provider Triage Evaluation Note  Katie Spencer , a 43 y.o. female  was evaluated in triage.  Pt complains of chest pain. Intermittent sharp shooting pain to L breast x 1-2 weeks, now with sob, and chest pain.  No fever, chills, congestion.  No n/v/d.  No hx of PE/DVT.  Did have some rash around breasts recently.  Was wearing bra with underwire to help alleviate the rash  Review of Systems  Positive: As above Negative: As above  Physical Exam  BP (!) 141/115   Pulse (!) 117   Temp 99 F (37.2 C) (Oral)   Resp 15   LMP 04/04/2020 Comment: neg preg test  SpO2 98%  Gen:   Awake, no distress   Resp:  Normal effort  MSK:   Moves extremities without difficulty  Other:    Medical Decision Making  Medically screening exam initiated at 2:41 PM.  Appropriate orders placed.  Katie Spencer was informed that the remainder of the evaluation will be completed by another provider, this initial triage assessment does not replace that evaluation, and the importance of remaining in the ED until their evaluation is complete.     Fayrene Helper, PA-C 11/01/23 1442

## 2023-11-01 NOTE — ED Provider Notes (Signed)
[place] EMERGENCY DEPARTMENT AT [facility] Provider Note   CSN: [id number] Arrival date & time: 11/01/23  1355     History  Chief Complaint  Patient presents with   Chest Pain    Katie Spencer is a 43 y.o. female.  43 year old female presents with several weeks of sharp left-sided chest discomfort.  Patient states that is worse with certain movements.  Patient states that she has been pain management due to chronic lower back pain.  States that she has been using muscle laxatives with minimal relief.  Denies any shortness of breath.  No cough or congestion.  No anginal or CHF qualities noted.  No prior history of PE or DVT.  States that elevating her breast does make her symptoms better.       Home Medications Prior to Admission medications   Medication Sig Start Date End Date Taking? Authorizing Provider  ABILIFY MAINTENA 400 MG SRER injection Inject 400 mg into the muscle every 28 (twenty-eight) days. 06/18/22   [provider]  butalbital-acetaminophen-caffeine (FIORICET) 50-325-40 MG tablet Take 1 tablet by mouth 2 (two) times daily as needed for headache.    [provider]  citalopram (CELEXA) 40 MG tablet Take 40 mg by mouth daily. 01/01/20   [provider]  dicyclomine (BENTYL) 20 MG tablet Take 1 tablet (20 mg total) by mouth 2 (two) times daily. 03/10/23   Chase Caller, MD  gabapentin (NEURONTIN) 600 MG tablet Take 600 mg by mouth 3 (three) times daily. 05/19/22   [provider]  HYDROcodone-acetaminophen (NORCO/VICODIN) 5-325 MG tablet Take 1 tablet by mouth 3 (three) times daily as needed for moderate pain or severe pain. 07/09/22   [provider]  hydrocortisone (ANUSOL-HC) 25 MG suppository Place 1 suppository (25 mg total) rectally 2 (two) times daily. 07/20/23   Charlynne Pander, MD  hydrOXYzine (ATARAX/VISTARIL) 25 MG tablet Take 25 mg by mouth 3 (three) times daily as needed for anxiety.  04/30/20    [provider]  loperamide (IMODIUM) 2 MG capsule Take 1 capsule (2 mg total) by mouth 4 (four) times daily as needed for diarrhea or loose stools. 11/22/21   Redwine, Madison A, PA-C  methocarbamol (ROBAXIN) 500 MG tablet Take 1 tablet (500 mg total) by mouth 2 (two) times daily. 03/25/23   Jeannie Fend, PA-C  omeprazole (PRILOSEC) 40 MG capsule Take 1 capsule (40 mg total) by mouth daily as needed (indigestion). 03/25/23   Jeannie Fend, PA-C  ondansetron (ZOFRAN) 4 MG tablet Take 1 tablet (4 mg total) by mouth every 6 (six) hours. 10/03/22   Ernie Avena, MD  ondansetron (ZOFRAN-ODT) 4 MG disintegrating tablet Take 1 tablet (4 mg total) by mouth every 8 (eight) hours as needed for nausea or vomiting. 03/10/23   Chase Caller, MD  pantoprazole (PROTONIX) 40 MG tablet Take 1 tablet (40 mg total) by mouth 2 (two) times daily. 11/22/21 12/22/21  Redwine, Madison A, PA-C  potassium chloride SA (KLOR-CON M) 20 MEQ tablet Take 1 tablet (20 mEq total) by mouth 2 (two) times daily. 08/21/22   Dione Booze, MD  predniSONE (STERAPRED UNI-PAK 21 TAB) 10 MG (21) TBPK tablet Take by mouth daily. Take 6 tabs by mouth daily  for 2 days, then 5 tabs for 2 days, then 4 tabs for 2 days, then 3 tabs for 2 days, 2 tabs for 2 days, then 1 tab by mouth daily for 2 days 03/25/23   Jeannie Fend,  PA-C  SUMAtriptan (IMITREX) 100 MG tablet Take 100 mg by mouth every 2 (two) hours as needed for migraine. 02/09/22   [provider]  belladonna-opium (B&O SUPPRETTES) 16.2-30 MG suppository Place 1 suppository rectally every 8 (eight) hours as needed for pain. Patient not taking: Reported on 05/27/2020 05/20/20 08/18/20  Gilda Crease, MD  lansoprazole (PREVACID) 30 MG capsule Take 1 capsule daily while taking ibuprofen. Patient not taking: Reported on 05/27/2020 04/16/20 08/18/20  Molpus, Jonny Ruiz, MD      Allergies    Morphine and codeine, Almond (diagnostic), and Nsaids    Review of Systems   Review of  Systems  All other systems reviewed and are negative.   Physical Exam Updated Vital Signs BP (!) 141/115   Pulse (!) 117   Temp 99 F (37.2 C) (Oral)   Resp 15   LMP 04/04/2020 Comment: neg preg test  SpO2 98%  Physical Exam Vitals and nursing note reviewed. Exam conducted with a chaperone present.  Constitutional:      General: She is not in acute distress.    Appearance: Normal appearance. She is well-developed. She is not toxic-appearing.  HENT:     Head: Normocephalic and atraumatic.  Eyes:     General: Lids are normal.     Conjunctiva/sclera: Conjunctivae normal.     Pupils: Pupils are equal, round, and reactive to light.  Neck:     Thyroid: No thyroid mass.     Trachea: No tracheal deviation.  Cardiovascular:     Rate and Rhythm: Normal rate and regular rhythm.     Heart sounds: Normal heart sounds. No murmur heard.    No gallop.  Pulmonary:     Effort: Pulmonary effort is normal. No respiratory distress.     Breath sounds: Normal breath sounds. No stridor. No decreased breath sounds, wheezing, rhonchi or rales.  Chest:    Abdominal:     General: There is no distension.     Palpations: Abdomen is soft.     Tenderness: There is no abdominal tenderness. There is no rebound.  Musculoskeletal:        General: No tenderness. Normal range of motion.     Cervical back: Normal range of motion and neck supple.  Skin:    General: Skin is warm and dry.     Findings: No abrasion or rash.  Neurological:     Mental Status: She is alert and oriented to person, place, and time. Mental status is at baseline.     GCS: GCS eye subscore is 4. GCS verbal subscore is 5. GCS motor subscore is 6.     Cranial Nerves: No cranial nerve deficit.     Sensory: No sensory deficit.     Motor: Motor function is intact.  Psychiatric:        Attention and Perception: Attention normal.        Speech: Speech normal.        Behavior: Behavior normal.     ED Results / Procedures /  Treatments   Labs (all labs ordered are listed, but only abnormal results are displayed) Labs Reviewed  COMPREHENSIVE METABOLIC PANEL - Abnormal; Notable for the following components:      Result Value   CO2 21 (*)    Glucose, Bld 245 (*)    All other components within normal limits  RESP PANEL BY RT-PCR (RSV, FLU A&B, COVID)  RVPGX2  CBC WITH DIFFERENTIAL/PLATELET  D-DIMER, QUANTITATIVE  TROPONIN I (HIGH SENSITIVITY)  TROPONIN I (HIGH SENSITIVITY)    EKG EKG Interpretation Date/Time:  Thursday November 01 2023 14:01:49 EST Ventricular Rate:  116 PR Interval:  121 QRS Duration:  90 QT Interval:  287 QTC Calculation: 399 R Axis:   79  Text Interpretation: Sinus tachycardia Borderline repolarization abnormality Confirmed by Lorre Nick ([id number]) on 11/01/2023 4:04:32 PM  Radiology No results found.  Procedures Procedures    Medications Ordered in ED Medications - No data to display  ED Course/ Medical Decision Making/ A&P                                 Medical Decision Making Amount and/or Complexity of Data Reviewed Labs: ordered.   Patient is EKG interpretation shows sinus tach.  D-dimer is negative.  Patient chest discomfort is reproducible on her left anterior chest wall.  Low suspicion for PE or ACS.  No evidence of rashes on the patient's skin currently.  Was recently treated for yeast infection.  Troponin negative here.  Mildly increased blood sugar but no evidence of DKA.  Chest x-ray without acute findings.  Suspect patient has chest wall pain will place on prednisone and discharged home        Final Clinical Impression(s) / ED Diagnoses Final diagnoses:  None    Rx / DC Orders ED Discharge Orders     None         Lorre Nick, MD 11/01/23 1627

## 2023-12-20 ENCOUNTER — Other Ambulatory Visit: Payer: Self-pay

## 2023-12-20 ENCOUNTER — Encounter (HOSPITAL_BASED_OUTPATIENT_CLINIC_OR_DEPARTMENT_OTHER): Payer: Self-pay | Admitting: Emergency Medicine

## 2023-12-20 ENCOUNTER — Emergency Department (HOSPITAL_BASED_OUTPATIENT_CLINIC_OR_DEPARTMENT_OTHER): Payer: MEDICAID

## 2023-12-20 ENCOUNTER — Inpatient Hospital Stay (HOSPITAL_BASED_OUTPATIENT_CLINIC_OR_DEPARTMENT_OTHER)
Admission: EM | Admit: 2023-12-20 | Discharge: 2023-12-22 | DRG: 102 | Disposition: A | Discharge status: Home / Self Care | Payer: MEDICAID | Attending: Internal Medicine | Admitting: Internal Medicine

## 2023-12-20 DIAGNOSIS — H532 Diplopia: Secondary | ICD-10-CM | POA: Diagnosis present

## 2023-12-20 DIAGNOSIS — Z91018 Allergy to other foods: Secondary | ICD-10-CM

## 2023-12-20 DIAGNOSIS — Z79899 Other long term (current) drug therapy: Secondary | ICD-10-CM

## 2023-12-20 DIAGNOSIS — F3181 Bipolar II disorder: Secondary | ICD-10-CM | POA: Diagnosis present

## 2023-12-20 DIAGNOSIS — F32A Depression, unspecified: Secondary | ICD-10-CM | POA: Diagnosis present

## 2023-12-20 DIAGNOSIS — E876 Hypokalemia: Secondary | ICD-10-CM

## 2023-12-20 DIAGNOSIS — M549 Dorsalgia, unspecified: Secondary | ICD-10-CM | POA: Diagnosis present

## 2023-12-20 DIAGNOSIS — Z885 Allergy status to narcotic agent status: Secondary | ICD-10-CM

## 2023-12-20 DIAGNOSIS — I10 Essential (primary) hypertension: Secondary | ICD-10-CM | POA: Diagnosis present

## 2023-12-20 DIAGNOSIS — R7303 Prediabetes: Secondary | ICD-10-CM | POA: Diagnosis present

## 2023-12-20 DIAGNOSIS — F419 Anxiety disorder, unspecified: Secondary | ICD-10-CM | POA: Diagnosis present

## 2023-12-20 DIAGNOSIS — Z90710 Acquired absence of both cervix and uterus: Secondary | ICD-10-CM

## 2023-12-20 DIAGNOSIS — Z888 Allergy status to other drugs, medicaments and biological substances status: Secondary | ICD-10-CM

## 2023-12-20 DIAGNOSIS — Z720 Tobacco use: Secondary | ICD-10-CM

## 2023-12-20 DIAGNOSIS — G8929 Other chronic pain: Secondary | ICD-10-CM | POA: Diagnosis present

## 2023-12-20 DIAGNOSIS — Z79890 Hormone replacement therapy (postmenopausal): Secondary | ICD-10-CM

## 2023-12-20 DIAGNOSIS — R270 Ataxia, unspecified: Secondary | ICD-10-CM | POA: Diagnosis present

## 2023-12-20 DIAGNOSIS — I639 Cerebral infarction, unspecified: Secondary | ICD-10-CM | POA: Diagnosis present

## 2023-12-20 DIAGNOSIS — G43109 Migraine with aura, not intractable, without status migrainosus: Principal | ICD-10-CM | POA: Diagnosis present

## 2023-12-20 DIAGNOSIS — R339 Retention of urine, unspecified: Secondary | ICD-10-CM | POA: Diagnosis present

## 2023-12-20 DIAGNOSIS — Z886 Allergy status to analgesic agent status: Secondary | ICD-10-CM

## 2023-12-20 DIAGNOSIS — I6389 Other cerebral infarction: Principal | ICD-10-CM | POA: Diagnosis present

## 2023-12-20 DIAGNOSIS — R110 Nausea: Secondary | ICD-10-CM | POA: Diagnosis present

## 2023-12-20 DIAGNOSIS — F17210 Nicotine dependence, cigarettes, uncomplicated: Secondary | ICD-10-CM | POA: Diagnosis present

## 2023-12-20 DIAGNOSIS — H539 Unspecified visual disturbance: Principal | ICD-10-CM

## 2023-12-20 DIAGNOSIS — R42 Dizziness and giddiness: Secondary | ICD-10-CM

## 2023-12-20 DIAGNOSIS — M5416 Radiculopathy, lumbar region: Secondary | ICD-10-CM | POA: Diagnosis present

## 2023-12-20 DIAGNOSIS — F329 Major depressive disorder, single episode, unspecified: Secondary | ICD-10-CM | POA: Diagnosis present

## 2023-12-20 DIAGNOSIS — R471 Dysarthria and anarthria: Secondary | ICD-10-CM | POA: Diagnosis present

## 2023-12-20 DIAGNOSIS — E785 Hyperlipidemia, unspecified: Secondary | ICD-10-CM | POA: Diagnosis present

## 2023-12-20 LAB — COMPREHENSIVE METABOLIC PANEL WITH GFR
ALT: 36 U/L (ref 0–44)
AST: 28 U/L (ref 15–41)
Albumin: 4.7 g/dL (ref 3.5–5.0)
Alkaline Phosphatase: 99 U/L (ref 38–126)
Anion gap: 11 (ref 5–15)
BUN: 15 mg/dL (ref 6–20)
CO2: 24 mmol/L (ref 22–32)
Calcium: 9.5 mg/dL (ref 8.9–10.3)
Chloride: 103 mmol/L (ref 98–111)
Creatinine, Ser: 1.03 mg/dL — ABNORMAL HIGH (ref 0.44–1.00)
GFR, Estimated: >60 mL/min (ref >60)
Glucose, Bld: 118 mg/dL — ABNORMAL HIGH (ref 70–99)
Potassium: 3.3 mmol/L — ABNORMAL LOW (ref 3.5–5.1)
Sodium: 138 mmol/L (ref 135–145)
Total Bilirubin: 0.6 mg/dL (ref 0.0–1.2)
Total Protein: 8.4 g/dL — ABNORMAL HIGH (ref 6.5–8.1)

## 2023-12-20 LAB — RAPID URINE DRUG SCREEN, HOSP PERFORMED
Amphetamines: NOT DETECTED
Barbiturates: NOT DETECTED
Benzodiazepines: NOT DETECTED
Cocaine: NOT DETECTED
Opiates: POSITIVE — AB
Tetrahydrocannabinol: NOT DETECTED

## 2023-12-20 LAB — CBG MONITORING, ED: Glucose-Capillary: 132 mg/dL — ABNORMAL HIGH (ref 70–99)

## 2023-12-20 LAB — DIFFERENTIAL
Abs Immature Granulocytes: 0.03 10*3/uL (ref 0.00–0.07)
Basophils Absolute: 0.1 10*3/uL (ref 0.0–0.1)
Basophils Relative: 1 %
Eosinophils Absolute: 0.2 10*3/uL (ref 0.0–0.5)
Eosinophils Relative: 2 %
Immature Granulocytes: 0 %
Lymphocytes Relative: 33 %
Lymphs Abs: 3.7 10*3/uL (ref 0.7–4.0)
Monocytes Absolute: 0.6 10*3/uL (ref 0.1–1.0)
Monocytes Relative: 6 %
Neutro Abs: 6.5 10*3/uL (ref 1.7–7.7)
Neutrophils Relative %: 58 %

## 2023-12-20 LAB — URINALYSIS, MICROSCOPIC (REFLEX): RBC / HPF: NONE SEEN RBC/hpf (ref 0–5)

## 2023-12-20 LAB — CBC
HCT: 40 % (ref 36.0–46.0)
Hemoglobin: 14.5 g/dL (ref 12.0–15.0)
MCH: 32 pg (ref 26.0–34.0)
MCHC: 36.3 g/dL — ABNORMAL HIGH (ref 30.0–36.0)
MCV: 88.3 fL (ref 80.0–100.0)
Platelets: 276 10*3/uL (ref 150–400)
RBC: 4.53 MIL/uL (ref 3.87–5.11)
RDW: 11.8 % (ref 11.5–15.5)
WBC: 11.2 10*3/uL — ABNORMAL HIGH (ref 4.0–10.5)
nRBC: 0 % (ref 0.0–0.2)

## 2023-12-20 LAB — URINALYSIS, ROUTINE W REFLEX MICROSCOPIC
Bilirubin Urine: NEGATIVE
Glucose, UA: NEGATIVE mg/dL
Hgb urine dipstick: NEGATIVE
Ketones, ur: NEGATIVE mg/dL
Nitrite: NEGATIVE
Protein, ur: NEGATIVE mg/dL
Specific Gravity, Urine: 1.015 (ref 1.005–1.030)
pH: 7 (ref 5.0–8.0)

## 2023-12-20 LAB — PROTIME-INR
INR: 0.9 (ref 0.8–1.2)
Prothrombin Time: 12.8 s (ref 11.4–15.2)

## 2023-12-20 LAB — ETHANOL: Alcohol, Ethyl (B): <10 mg/dL (ref <10)

## 2023-12-20 LAB — APTT: aPTT: 28 s (ref 24–36)

## 2023-12-20 MED ORDER — PROCHLORPERAZINE EDISYLATE 10 MG/2ML IJ SOLN
10.0000 mg | Freq: Once | INTRAMUSCULAR | Status: AC
  Administered 2023-12-20: 10 mg via INTRAVENOUS
  Filled 2023-12-20: qty 2

## 2023-12-20 MED ORDER — IOHEXOL 350 MG/ML SOLN
75.0000 mL | Freq: Once | INTRAVENOUS | Status: AC | PRN
  Administered 2023-12-20: 75 mL via INTRAVENOUS

## 2023-12-20 MED ORDER — DIPHENHYDRAMINE HCL 50 MG/ML IJ SOLN
50.0000 mg | Freq: Once | INTRAMUSCULAR | Status: AC
  Administered 2023-12-20: 50 mg via INTRAVENOUS
  Filled 2023-12-20: qty 1

## 2023-12-20 MED ORDER — LACTATED RINGERS IV BOLUS
1000.0000 mL | Freq: Once | INTRAVENOUS | Status: AC
  Administered 2023-12-20: 1000 mL via INTRAVENOUS

## 2023-12-20 MED ORDER — IOHEXOL 300 MG/ML  SOLN: 75.0000 mL | Freq: Once | INTRAMUSCULAR | Status: DC | PRN

## 2023-12-20 NOTE — ED Triage Notes (Addendum)
 Reports vision change , double vision , tunnel vision , dizziness , nausea  all started at 1630. Hx vertigo . Denies weakness to extremities .  Reports slight headache . Denies Hx HTN . Alert and oriented x 4 , wheeled to triage room due to blurry vision  Adds feels ringing to right ear

## 2023-12-20 NOTE — ED Notes (Signed)
 Returned from CT at this time. Patient back in room awaiting for Tele-Neurologist

## 2023-12-20 NOTE — Progress Notes (Signed)
 Plan of Care Note for accepted transfer   Patient: Katie Spencer MRN: [id number]   DOA: 12/20/2023  Facility requesting transfer: [facility]   Requesting Provider: Dr. Doran Durand   Reason for transfer: Rule-out CVA   Facility course: 43 yr old female with depression and anxiety presents with acute-onset of vision disturbance, dizziness, and nausea. There is no acute findings on head CT.   She has had similar symptoms attributed to migraine but those episodes have resolved within an hour previously while this is persisting.   Teleneuro recommended observing in the hospital, checking CTA head and neck and MRI brain, and starting ASA.   Plan of care: The patient is accepted for admission to Telemetry unit, at [facility].   Author: Briscoe Deutscher, MD 12/20/2023  Check [email or web address] for on-call coverage.  Nursing staff, Please call [facility] Admits & Consults System-Wide number on Amion as soon as patient's arrival, so appropriate admitting provider can evaluate the pt.

## 2023-12-20 NOTE — ED Notes (Signed)
 Dr.Tamer, Neurologist, called off code stroke at this time. Patient is not a candidate for thrombolytics. Verbal with readback.

## 2023-12-20 NOTE — ED Notes (Signed)
 Went to CT with the Pt,

## 2023-12-20 NOTE — ED Provider Notes (Signed)
 [place] EMERGENCY DEPARTMENT AT [facility] [place] Provider Note   CSN: [id number] Arrival date & time: 12/20/23  1932     History Chief Complaint  Patient presents with   Visual Field Change   Dizziness    HPI Katie Spencer is a 43 y.o. female presenting for chief complaint of visual field change. States that at 4:30 PM she was working outside when she had sudden onset diplopia.  It is still present. Endorses a history of symptoms in the past that typically resolve within 20 to 30 minutes this been present for 3 hours by time of arrival. Denies fevers chills chest pain syncope shortness of breath otherwise..   Patient's recorded medical, surgical, social, medication list and allergies were reviewed in the Snapshot window as part of the initial history.   Review of Systems   Review of Systems  Constitutional:  Negative for chills and fever.  HENT:  Negative for ear pain and sore throat.   Eyes:  Positive for visual disturbance. Negative for pain.  Respiratory:  Negative for cough and shortness of breath.   Cardiovascular:  Negative for chest pain and palpitations.  Gastrointestinal:  Negative for abdominal pain and vomiting.  Genitourinary:  Negative for dysuria and hematuria.  Musculoskeletal:  Negative for arthralgias and back pain.  Skin:  Negative for color change and rash.  Neurological:  Positive for dizziness. Negative for seizures and syncope.  All other systems reviewed and are negative.   Physical Exam Updated Vital Signs BP 111/69   Pulse 75   Temp (!) 97.2 F (36.2 C)   Resp 18   Wt 94.3 kg   LMP 04/04/2020 Comment: neg preg test  SpO2 99%   BMI 35.70 kg/m  Physical Exam Vitals and nursing note reviewed.  Constitutional:      General: She is not in acute distress.    Appearance: She is well-developed.  HENT:     Head: Normocephalic and atraumatic.  Eyes:     Conjunctiva/sclera: Conjunctivae normal.  Cardiovascular:     Rate and  Rhythm: Normal rate and regular rhythm.     Heart sounds: No murmur heard. Pulmonary:     Effort: Pulmonary effort is normal. No respiratory distress.     Breath sounds: Normal breath sounds.  Abdominal:     General: There is no distension.     Palpations: Abdomen is soft.     Tenderness: There is no abdominal tenderness. There is no right CVA tenderness or left CVA tenderness.  Musculoskeletal:        General: No swelling or tenderness. Normal range of motion.     Cervical back: Neck supple.  Skin:    General: Skin is warm and dry.  Neurological:     General: No focal deficit present.     Mental Status: She is alert and oriented to person, place, and time. Mental status is at baseline.     Cranial Nerves: No cranial nerve deficit.      ED Course/ Medical Decision Making/ A&P    Procedures .Critical Care  Performed by: Glyn Ade, MD Authorized by: Glyn Ade, MD   Critical care provider statement:    Critical care time (minutes):  95   Critical care was necessary to treat or prevent imminent or life-threatening deterioration of the following conditions:  CNS failure or compromise   Critical care was time spent personally by me on the following activities:  Development of treatment plan with patient or surrogate, discussions with  consultants, evaluation of patient's response to treatment, examination of patient, ordering and review of laboratory studies, ordering and review of radiographic studies, ordering and performing treatments and interventions, pulse oximetry, re-evaluation of patient's condition and review of old charts   Care discussed with: admitting provider      Medications Ordered in ED Medications  iohexol (OMNIPAQUE) 300 MG/ML solution 75 mL ( Intravenous Canceled Entry 12/20/23 2156)  prochlorperazine (COMPAZINE) injection 10 mg (10 mg Intravenous Given 12/20/23 2115)  diphenhydrAMINE (BENADRYL) injection 50 mg (50 mg Intravenous Given 12/20/23 2115)   lactated ringers bolus 1,000 mL (1,000 mLs Intravenous New Bag/Given 12/20/23 2114)  iohexol (OMNIPAQUE) 350 MG/ML injection 75 mL (75 mLs Intravenous Contrast Given 12/20/23 2157)    Medical Decision Making:   Code stroke activation at 7:45 PM.  Last known well 4:30 PM. Medical Decision Making:    Cereniti Curb is a 43 y.o. female  who presented to the ED today with diploplia and dizziness.  Due to these symptoms, nursing activated a CODE STROKE per hospital protocol.   Patient placed on continuous vitals and telemetry monitoring while in ED which was reviewed periodically.   On my initial exam, the pt was in no acute distress, glucose was WNL and deficits include double vision/visual changes.  Deficits are persistent.   Reviewed and confirmed nursing documentation for past medical history, family history, social history.   Initial Assessment and Plan:   Patient immediately evaluated jointly by teleneurology and emergency department providers.   This is most consistent with an acute life/limb threatening illness complicated by underlying chronic conditions.   Patient evaluated per code stroke protocol with immediate cross-sectional imaging of the head via CT head to evaluate for intracranial hemorrhage.  This was augmented with CT angiography and perfusional imaging of the brain for further evaluation of large vessel occlusion.   Per neurology, these rapid studies revealed no acute pathology. Neurology feels that patient's presentation is more consistent with alternative pathology given these findings.  Differential includes metabolic encephalopathy, medication encephalopathy, infectious encephalopathy, retinal pathology. Neurology has recommended an MRI for further differentiation of patient's syndrome and evaluation for any ischemic disease while completing a metabolic/infectious workup with laboratory evaluation per EMR.  Initial Study Results: Labs Labs reviewed without evidence of  clinically relevant abnormality.  EKG EKG was reviewed independently. Rate, rhythm, axis, intervals all examined and without medically relevant abnormality. ST segments without concerns for elevations.    Radiology  Images reviewed independently, agree with radiology report at this time.   CT ANGIO HEAD NECK W WO CM  Final Result    CT HEAD CODE STROKE WO CONTRAST  Final Result    MR BRAIN WO CONTRAST    (Results Pending)      Final Assessment and Plan:   Ultimately, neurology had multiple reasons for not giving TNKase.  Patient's NIH was low per their calculation and they had a shared medical decision making with the patient per their presentation to myself. Ultimately patient had elected not to proceed with TNKase for the symptoms per their presentation.  I did follow this up with the patient and they expressed agreement with the plan presented to myself by neurology. CT head and CTA at this facility showed no focal pathology though MRI may further differentiate.  She is having gradual improvement after treatment with Compazine and Benadryl however MRI was recommended by neurology for further differentiation.  Consulted medicine for admission given the clinical history patient was accepted for admission.  Clinical  Impression:  1. Visual changes   2. Dizziness      Admit   Final Clinical Impression(s) / ED Diagnoses Final diagnoses:  Dizziness  Visual changes    Rx / DC Orders ED Discharge Orders     None         Glyn Ade, MD 12/20/23 2240

## 2023-12-20 NOTE — Consult Note (Addendum)
 [facility] Tele[facility]urology Consult Services   Katie Spencer Name:   Katie Spencer, Katie Spencer Date of Birth:   06-04-1981 Identification Number:   MRN - [id number] Date of Service:   12/20/2023 19:51:24  Diagnosis:       H53.8 - Blurred Vision  Impression:      The Katie Spencer's presentation is more suggestive of a migraine aura rather than a stroke. She has no visual field defect and no blindness. Her only issue is making out words from a distance. Even if this is a stroke (would have to be bilateral PCAs), this is not felt to be disabling, and Katie Spencer agrees. I feel the risks of TNK outweigh the benefits in this case. The other issue is I cannot look at her fundus through the video, so it is unclear if she has retinal hemorrhage. The presence of this pathology would mean high risk of blindness with TNK. Based on all this, I held TNK. The other possibility (less likely) would be PRES which would be high risk of bleeding with TNK. I discussed this in detail with the Katie Spencer who agrees with this and declined TNK based on this explanation.  Check CTAs to exclude LVO.  Would still admit the Katie Spencer for brain MRI. Would get ophthalmology evaluation if possible.  Recommend aspirin 81mg  daily until a stroke is ruled out.  Recommend control of any vascular risk factors per primary. Discussed smoking cessation with the Katie Spencer.  Recommend inpatient Neurology follow up.  Our recommendations are outlined below.  Recommendations:        Stroke/Telemetry Floor       Neuro Checks       Bedside Swallow Eval       DVT Prophylaxis       IV Fluids, Normal Saline       Head of Bed 30 Degrees       Euglycemia and Avoid Hyperthermia (PRN Acetaminophen)       Initiate or continue Aspirin 81 MG daily  Sign Out:       Discussed with Emergency Department Provider    ------------------------------------------------------------------------------  Addendum: Advanced Imaging:  CTA Head and Neck  Completed.  LVO: No   Metrics: Last Known Well: 12/20/2023 16:30:00 Dispatch Time: 12/20/2023 19:51:23 Arrival Time: 12/20/2023 19:32:00 Initial Response Time: 12/20/2023 20:02:05 Symptoms: Blurry vision. Initial Katie Spencer interaction: 12/20/2023 20:05:41 NIHSS Assessment Completed: 12/20/2023 20:19:41 Katie Spencer is not a candidate for Thrombolytic. Thrombolytic Medical Decision: 12/20/2023 20:19:42 Katie Spencer was not deemed candidate for Thrombolytic because of following reasons: Stroke severity too mild (non-disabling) .  CT head showed no acute hemorrhage or acute core infarct.  Primary Provider Notified of Diagnostic Impression and Management Plan on: 12/20/2023 20:34:30    ------------------------------------------------------------------------------  History of Present Illness: Katie Spencer is a Katie Spencer.  Katie Spencer was brought by private transportation with symptoms of Blurry vision. Katie yo Spencer with a history of migraines who came in with blurry vision. This started suddenly at 4:30pm today. She felt her vision was blurry bilaterally, then it almost resolved. Later on it came back and she decided to get checked. She can see and read from a short distance, but vision is very blurred from a distance. This affects both eyes, with significant photophobia and nausea, but no headache. She had a similar episode last year that self resolved. She has a history of migraines as well. She has no focal deficits.    Medications:  No Anticoagulant use  No Antiplatelet use Reviewed EMR for current medications  Allergies:  Reviewed  Social History: Smoking: Yes Alcohol Use: No Drug Use: No  Family History:  There is no family history of premature cerebrovascular disease pertinent to this consultation  ROS : 14 Points Review of Systems was performed and was negative except mentioned in HPI.  Past Surgical History: There Is No Surgical History Contributory To Today's Visit      Examination: BP(142/89), Pulse(88), Blood Glucose(132) 1A: Level of Consciousness - Alert; keenly responsive + 0 1B: Ask Month and Age - Both Questions Right + 0 1C: Blink Eyes & Squeeze Hands - Performs Both Tasks + 0 2: Test Horizontal Extraocular Movements - Normal + 0 3: Test Visual Fields - No Visual Loss + 0 4: Test Facial Palsy (Use Grimace if Obtunded) - Normal symmetry + 0 5A: Test Left Arm Motor Drift - No Drift for 10 Seconds + 0 5B: Test Right Arm Motor Drift - No Drift for 10 Seconds + 0 6A: Test Left Leg Motor Drift - No Drift for 5 Seconds + 0 6B: Test Right Leg Motor Drift - No Drift for 5 Seconds + 0 7: Test Limb Ataxia (FNF/Heel-Shin) - No Ataxia + 0 8: Test Sensation - Normal; No sensory loss + 0 9: Test Language/Aphasia - Normal; No aphasia + 0 10: Test Dysarthria - Normal + 0 11: Test Extinction/Inattention - No abnormality + 0  NIHSS Score: 0   Pre-Morbid Modified Rankin Scale: 0 Points = No symptoms at all  Spoke with : Dr Doran Durand  This consult was conducted in real time using interactive audio and Immunologist. Katie Spencer was informed of the technology being used for this visit and agreed to proceed. Katie Spencer located in hospital and provider located at home/office setting.   Katie Spencer is being evaluated for possible acute neurologic impairment and high probability of imminent or life-threatening deterioration. I spent total of 35 minutes providing care to this Katie Spencer, including time for face to face visit via telemedicine, review of medical records, imaging studies and discussion of findings with providers, the Katie Spencer and/or family.   Dr Hollace Hayward   [facility] For Inpatient follow-up with [facility] physician please call [facility] at [phone]. As we are not an outpatient service for any post hospital discharge needs please contact the hospital for assistance. If you have any questions for the [facility] physicians or need to  reconsult for clinical or diagnostic changes please contact us via [facility] at [phone].

## 2023-12-20 NOTE — Progress Notes (Signed)
 1947 Elerted for code stroke LKW 1630 vision blurred and tunnel vision in bilateral eyes and her head feels hot 1951 TS paged, pt to CT  2000 back to ER room from CT  2202 Ammar MD on camera for exam  2015 CT results reported to Ammar on camera   mRS 0

## 2023-12-21 ENCOUNTER — Emergency Department (HOSPITAL_COMMUNITY): Payer: MEDICAID

## 2023-12-21 DIAGNOSIS — G43109 Migraine with aura, not intractable, without status migrainosus: Secondary | ICD-10-CM | POA: Diagnosis present

## 2023-12-21 DIAGNOSIS — Z886 Allergy status to analgesic agent status: Secondary | ICD-10-CM | POA: Diagnosis not present

## 2023-12-21 DIAGNOSIS — M5416 Radiculopathy, lumbar region: Secondary | ICD-10-CM | POA: Diagnosis present

## 2023-12-21 DIAGNOSIS — Z720 Tobacco use: Secondary | ICD-10-CM | POA: Diagnosis not present

## 2023-12-21 DIAGNOSIS — I10 Essential (primary) hypertension: Secondary | ICD-10-CM | POA: Diagnosis present

## 2023-12-21 DIAGNOSIS — R339 Retention of urine, unspecified: Secondary | ICD-10-CM | POA: Diagnosis present

## 2023-12-21 DIAGNOSIS — Z90710 Acquired absence of both cervix and uterus: Secondary | ICD-10-CM | POA: Diagnosis not present

## 2023-12-21 DIAGNOSIS — M549 Dorsalgia, unspecified: Secondary | ICD-10-CM | POA: Diagnosis present

## 2023-12-21 DIAGNOSIS — F419 Anxiety disorder, unspecified: Secondary | ICD-10-CM | POA: Diagnosis present

## 2023-12-21 DIAGNOSIS — Z91018 Allergy to other foods: Secondary | ICD-10-CM | POA: Diagnosis not present

## 2023-12-21 DIAGNOSIS — Z79899 Other long term (current) drug therapy: Secondary | ICD-10-CM | POA: Diagnosis not present

## 2023-12-21 DIAGNOSIS — R110 Nausea: Secondary | ICD-10-CM | POA: Diagnosis present

## 2023-12-21 DIAGNOSIS — Z885 Allergy status to narcotic agent status: Secondary | ICD-10-CM | POA: Diagnosis not present

## 2023-12-21 DIAGNOSIS — I639 Cerebral infarction, unspecified: Secondary | ICD-10-CM

## 2023-12-21 DIAGNOSIS — I6389 Other cerebral infarction: Secondary | ICD-10-CM | POA: Diagnosis present

## 2023-12-21 DIAGNOSIS — H532 Diplopia: Secondary | ICD-10-CM | POA: Diagnosis present

## 2023-12-21 DIAGNOSIS — F1421 Cocaine dependence, in remission: Secondary | ICD-10-CM | POA: Diagnosis not present

## 2023-12-21 DIAGNOSIS — Z888 Allergy status to other drugs, medicaments and biological substances status: Secondary | ICD-10-CM | POA: Diagnosis not present

## 2023-12-21 DIAGNOSIS — E785 Hyperlipidemia, unspecified: Secondary | ICD-10-CM | POA: Diagnosis present

## 2023-12-21 DIAGNOSIS — E876 Hypokalemia: Secondary | ICD-10-CM

## 2023-12-21 DIAGNOSIS — R42 Dizziness and giddiness: Secondary | ICD-10-CM | POA: Diagnosis present

## 2023-12-21 DIAGNOSIS — R471 Dysarthria and anarthria: Secondary | ICD-10-CM | POA: Diagnosis present

## 2023-12-21 DIAGNOSIS — R7303 Prediabetes: Secondary | ICD-10-CM | POA: Diagnosis present

## 2023-12-21 DIAGNOSIS — F17210 Nicotine dependence, cigarettes, uncomplicated: Secondary | ICD-10-CM | POA: Diagnosis present

## 2023-12-21 DIAGNOSIS — R270 Ataxia, unspecified: Secondary | ICD-10-CM | POA: Diagnosis present

## 2023-12-21 DIAGNOSIS — Z79890 Hormone replacement therapy (postmenopausal): Secondary | ICD-10-CM | POA: Diagnosis not present

## 2023-12-21 DIAGNOSIS — F3181 Bipolar II disorder: Secondary | ICD-10-CM | POA: Diagnosis present

## 2023-12-21 DIAGNOSIS — G8929 Other chronic pain: Secondary | ICD-10-CM | POA: Diagnosis present

## 2023-12-21 LAB — CBC WITH DIFFERENTIAL/PLATELET
Abs Immature Granulocytes: 0.02 10*3/uL (ref 0.00–0.07)
Basophils Absolute: 0.1 10*3/uL (ref 0.0–0.1)
Basophils Relative: 1 %
Eosinophils Absolute: 0.2 10*3/uL (ref 0.0–0.5)
Eosinophils Relative: 2 %
HCT: 38.5 % (ref 36.0–46.0)
Hemoglobin: 13.7 g/dL (ref 12.0–15.0)
Immature Granulocytes: 0 %
Lymphocytes Relative: 34 %
Lymphs Abs: 2.6 10*3/uL (ref 0.7–4.0)
MCH: 31.6 pg (ref 26.0–34.0)
MCHC: 35.6 g/dL (ref 30.0–36.0)
MCV: 88.7 fL (ref 80.0–100.0)
Monocytes Absolute: 0.4 10*3/uL (ref 0.1–1.0)
Monocytes Relative: 5 %
Neutro Abs: 4.4 10*3/uL (ref 1.7–7.7)
Neutrophils Relative %: 58 %
Platelets: 255 10*3/uL (ref 150–400)
RBC: 4.34 MIL/uL (ref 3.87–5.11)
RDW: 11.5 % (ref 11.5–15.5)
WBC: 7.7 10*3/uL (ref 4.0–10.5)
nRBC: 0 % (ref 0.0–0.2)

## 2023-12-21 LAB — BASIC METABOLIC PANEL WITH GFR
Anion gap: 8 (ref 5–15)
BUN: 9 mg/dL (ref 6–20)
CO2: 26 mmol/L (ref 22–32)
Calcium: 9.4 mg/dL (ref 8.9–10.3)
Chloride: 105 mmol/L (ref 98–111)
Creatinine, Ser: 0.85 mg/dL (ref 0.44–1.00)
GFR, Estimated: >60 mL/min (ref >60)
Glucose, Bld: 142 mg/dL — ABNORMAL HIGH (ref 70–99)
Potassium: 3.2 mmol/L — ABNORMAL LOW (ref 3.5–5.1)
Sodium: 139 mmol/L (ref 135–145)

## 2023-12-21 LAB — HIV ANTIBODY (ROUTINE TESTING W REFLEX): HIV Screen 4th Generation wRfx: NONREACTIVE

## 2023-12-21 LAB — MAGNESIUM: Magnesium: 2.1 mg/dL (ref 1.7–2.4)

## 2023-12-21 LAB — HEMOGLOBIN A1C
Hgb A1c MFr Bld: 5 % (ref 4.8–5.6)
Mean Plasma Glucose: 96.8 mg/dL

## 2023-12-21 MED ORDER — ACETAMINOPHEN 160 MG/5ML PO SOLN: 650.0000 mg | ORAL | Status: DC | PRN

## 2023-12-21 MED ORDER — HYDROCODONE-ACETAMINOPHEN 7.5-325 MG PO TABS
1.0000 | ORAL_TABLET | Freq: Four times a day (QID) | ORAL | Status: DC | PRN
  Administered 2023-12-21 – 2023-12-22 (×4): 1 via ORAL
  Filled 2023-12-21 (×4): qty 1

## 2023-12-21 MED ORDER — POTASSIUM CHLORIDE CRYS ER 20 MEQ PO TBCR
20.0000 meq | EXTENDED_RELEASE_TABLET | Freq: Once | ORAL | Status: AC
  Administered 2023-12-21: 20 meq via ORAL
  Filled 2023-12-21: qty 1

## 2023-12-21 MED ORDER — ACETAMINOPHEN 325 MG PO TABS: 650.0000 mg | ORAL_TABLET | ORAL | Status: DC | PRN

## 2023-12-21 MED ORDER — KETOROLAC TROMETHAMINE 15 MG/ML IJ SOLN
15.0000 mg | Freq: Once | INTRAMUSCULAR | Status: AC
  Administered 2023-12-21: 15 mg via INTRAVENOUS
  Filled 2023-12-21: qty 1

## 2023-12-21 MED ORDER — NICOTINE 21 MG/24HR TD PT24
21.0000 mg | MEDICATED_PATCH | Freq: Every day | TRANSDERMAL | Status: DC
  Administered 2023-12-21 – 2023-12-22 (×2): 21 mg via TRANSDERMAL
  Filled 2023-12-21 (×2): qty 1

## 2023-12-21 MED ORDER — STROKE: EARLY STAGES OF RECOVERY BOOK
Freq: Once | Status: AC
  Filled 2023-12-21: qty 1

## 2023-12-21 MED ORDER — ORAL CARE MOUTH RINSE: 15.0000 mL | OROMUCOSAL | Status: DC | PRN

## 2023-12-21 MED ORDER — ASPIRIN 81 MG PO TBEC
81.0000 mg | DELAYED_RELEASE_TABLET | Freq: Every day | ORAL | Status: DC
  Administered 2023-12-21 – 2023-12-22 (×2): 81 mg via ORAL
  Filled 2023-12-21 (×2): qty 1

## 2023-12-21 MED ORDER — ACETAMINOPHEN 650 MG RE SUPP: 650.0000 mg | RECTAL | Status: DC | PRN

## 2023-12-21 MED ORDER — PANTOPRAZOLE SODIUM 40 MG PO TBEC: 40.0000 mg | DELAYED_RELEASE_TABLET | Freq: Every day | ORAL | Status: DC | PRN

## 2023-12-21 MED ORDER — DICYCLOMINE HCL 20 MG PO TABS: 20.0000 mg | ORAL_TABLET | Freq: Two times a day (BID) | ORAL | Status: DC | PRN

## 2023-12-21 MED ORDER — CYCLOBENZAPRINE HCL 10 MG PO TABS
10.0000 mg | ORAL_TABLET | Freq: Three times a day (TID) | ORAL | Status: DC
  Administered 2023-12-21 – 2023-12-22 (×4): 10 mg via ORAL
  Filled 2023-12-21 (×4): qty 1

## 2023-12-21 MED ORDER — SENNOSIDES-DOCUSATE SODIUM 8.6-50 MG PO TABS: 1.0000 | ORAL_TABLET | Freq: Every evening | ORAL | Status: DC | PRN

## 2023-12-21 MED ORDER — LORAZEPAM 2 MG/ML IJ SOLN
1.0000 mg | Freq: Once | INTRAMUSCULAR | Status: AC
  Administered 2023-12-21: 1 mg via INTRAVENOUS
  Filled 2023-12-21: qty 1

## 2023-12-21 MED ORDER — SODIUM CHLORIDE 0.9% FLUSH: 3.0000 mL | INTRAVENOUS | Status: DC | PRN

## 2023-12-21 MED ORDER — SODIUM CHLORIDE 0.9% FLUSH
3.0000 mL | Freq: Two times a day (BID) | INTRAVENOUS | Status: DC
  Administered 2023-12-21 (×2): 3 mL via INTRAVENOUS
  Administered 2023-12-22: 10 mL via INTRAVENOUS

## 2023-12-21 MED ORDER — GABAPENTIN 300 MG PO CAPS
600.0000 mg | ORAL_CAPSULE | Freq: Three times a day (TID) | ORAL | Status: DC
  Administered 2023-12-21 – 2023-12-22 (×4): 600 mg via ORAL
  Filled 2023-12-21 (×4): qty 2

## 2023-12-21 MED ORDER — CITALOPRAM HYDROBROMIDE 10 MG PO TABS
40.0000 mg | ORAL_TABLET | Freq: Every day | ORAL | Status: DC
  Administered 2023-12-21 – 2023-12-22 (×2): 40 mg via ORAL
  Filled 2023-12-21 (×2): qty 4

## 2023-12-21 NOTE — Assessment & Plan Note (Signed)
 Per patient follows with pain management Continue home flexeril, gabapentin and PRN norco

## 2023-12-21 NOTE — Plan of Care (Signed)
  Problem: Education: Goal: Knowledge of General Education information will improve Description: Including pain rating scale, medication(s)/side effects and non-pharmacologic comfort measures Outcome: Progressing   Problem: Health Behavior/Discharge Planning: Goal: Ability to manage health-related needs will improve Outcome: Progressing   Problem: Clinical Measurements: Goal: Ability to maintain clinical measurements within normal limits will improve Outcome: Progressing Goal: Will remain free from infection Outcome: Progressing Goal: Diagnostic test results will improve Outcome: Progressing Goal: Respiratory complications will improve Outcome: Progressing Goal: Cardiovascular complication will be avoided Outcome: Progressing   Problem: Activity: Goal: Risk for activity intolerance will decrease Outcome: Progressing   Problem: Nutrition: Goal: Adequate nutrition will be maintained Outcome: Progressing   Problem: Coping: Goal: Level of anxiety will decrease Outcome: Progressing   Problem: Elimination: Goal: Will not experience complications related to bowel motility Outcome: Progressing Goal: Will not experience complications related to urinary retention Outcome: Progressing   Problem: Pain Managment: Goal: General experience of comfort will improve and/or be controlled Outcome: Progressing   Problem: Safety: Goal: Ability to remain free from injury will improve Outcome: Progressing   Problem: Skin Integrity: Goal: Risk for impaired skin integrity will decrease Outcome: Progressing   Problem: Education: Goal: Knowledge of disease or condition will improve Outcome: Progressing Goal: Knowledge of secondary prevention will improve (MUST DOCUMENT ALL) Outcome: Progressing Goal: Knowledge of patient specific risk factors will improve (DELETE if not current risk factor) Outcome: Progressing   Problem: Ischemic Stroke/TIA Tissue Perfusion: Goal: Complications of  ischemic stroke/TIA will be minimized Outcome: Progressing   Problem: Coping: Goal: Will verbalize positive feelings about self Outcome: Progressing Goal: Will identify appropriate support needs Outcome: Progressing   Problem: Health Behavior/Discharge Planning: Goal: Ability to manage health-related needs will improve Outcome: Progressing Goal: Goals will be collaboratively established with patient/family Outcome: Progressing   Problem: Self-Care: Goal: Ability to participate in self-care as condition permits will improve Outcome: Progressing Goal: Verbalization of feelings and concerns over difficulty with self-care will improve Outcome: Progressing Goal: Ability to communicate needs accurately will improve Outcome: Progressing

## 2023-12-21 NOTE — Consult Note (Signed)
 NEUROLOGY CONSULT NOTE   Date of service: December 21, 2023 Patient Name: Katie Spencer MRN:  [id number] DOB:  11-11-1980 Chief Complaint: "stroke" Requesting Provider: Orland Mustard, MD  History of Present Illness  Katie Spencer is a 43 y.o. female with hx of bipolar disorder who presents with blurry vision at OSH and was transferred to [facility] after acute stroke on MRI. Around 4PM on 12/20/2023, patient experienced sudden onset binocular blurry vision and felt hot and flushed. When the symptoms did not improve, she decided to go to the ED. 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 Subsequent MRI brain showed right temporal restricted diffusion. Patient was subsequently transferred to [facility]. She reports that her blurry vision has improves some what. She denies weakness, numbness, or memory loss. She smokes a pack per day since age 20. Dnies any illicit drug use. Had hysterectomy years ago, not on any hormonal replacement therapy, denies any Hx of clotting disorder.    NIHSS components Score: Comment  1a Level of Conscious 0[x]  1[]  2[]  3[]      1b LOC Questions 0[x]  1[]  2[]       1c LOC Commands 0[x]  1[]  2[]       2 Best Gaze 0[x]  1[]  2[]       3 Visual 0[x]  1[]  2[]  3[]      4 Facial Palsy 0[x]  1[]  2[]  3[]      5a Motor Arm - left 0[x]  1[]  2[]  3[]  4[]  UN[]    5b Motor Arm - Right 0[x]  1[]  2[]  3[]  4[]  UN[]    6a Motor Leg - Left 0[x]  1[]  2[]  3[]  4[]  UN[]    6b Motor Leg - Right 0[x]  1[]  2[]  3[]  4[]  UN[]    7 Limb Ataxia 0[x]  1[]  2[]  3[]  UN[]     8 Sensory 0[x]  1[]  2[]  UN[]      9 Best Language 0[x]  1[]  2[]  3[]      10 Dysarthria 0[x]  1[]  2[]  UN[]      11 Extinct. and Inattention 0[x]  1[]  2[]       TOTAL: 0      ROS  Comprehensive ROS performed and pertinent positives documented in HPI    Past History   Past Medical History:  Diagnosis Date   Abnormal vaginal bleeding    Chronic abdominal pain    Chronic back pain    Renal disorder      Past Surgical History:  Procedure Laterality Date   ABDOMINAL HYSTERECTOMY     CESAREAN SECTION  2006, 2009, 2013, 2014   CHOLECYSTECTOMY      Family History: Family History  Adopted: Yes  Problem Relation Age of Onset   Cancer Mother    Cancer Other     Social History  reports that she has been smoking cigarettes. She has never used smokeless tobacco. She reports that she does not currently use alcohol. She reports that she does not use drugs.  Allergies  Allergen Reactions   Almond (Diagnostic) Itching and Rash   Diphenhydramine Other (See Comments)    Drowsy   Morphine And Codeine Hives, Itching, Swelling and Other (See Comments)    Hives/swelling/itching of arms    Nsaids Other (See Comments)    GI issues    Medications   Current Facility-Administered Medications:    [START ON 12/22/2023]  stroke: early stages of recovery book, , Does not apply, Once, Orland Mustard, MD   acetaminophen (TYLENOL) tablet 650 mg, 650 mg, Oral, Q4H PRN **OR** acetaminophen (TYLENOL) 160 MG/5ML solution  650 mg, 650 mg, Per Tube, Q4H PRN **OR** acetaminophen (TYLENOL) suppository 650 mg, 650 mg, Rectal, Q4H PRN, Orland Mustard, MD   aspirin EC tablet 81 mg, 81 mg, Oral, Daily, Orland Mustard, MD, 81 mg at 12/21/23 1431   citalopram (CELEXA) tablet 40 mg, 40 mg, Oral, Daily, Orland Mustard, MD, 40 mg at 12/21/23 1431   cyclobenzaprine (FLEXERIL) tablet 10 mg, 10 mg, Oral, TID, Orland Mustard, MD, 10 mg at 12/21/23 1533   dicyclomine (BENTYL) tablet 20 mg, 20 mg, Oral, BID PRN, Orland Mustard, MD   gabapentin (NEURONTIN) capsule 600 mg, 600 mg, Oral, TID, Orland Mustard, MD, 600 mg at 12/21/23 1532   HYDROcodone-acetaminophen (NORCO) 7.5-325 MG per tablet 1 tablet, 1 tablet, Oral, QID PRN, Orland Mustard, MD, 1 tablet at 12/21/23 1533   iohexol (OMNIPAQUE) 300 MG/ML solution 75 mL, 75 mL, Intravenous, Once PRN, Katie Ade, MD   nicotine (NICODERM CQ - dosed in mg/24 hours) patch 21  mg, 21 mg, Transdermal, Daily, Orland Mustard, MD, 21 mg at 12/21/23 1431   Oral care mouth rinse, 15 mL, Mouth Rinse, PRN, Orland Mustard, MD   pantoprazole (PROTONIX) EC tablet 40 mg, 40 mg, Oral, Daily PRN, Orland Mustard, MD   senna-docusate (Senokot-S) tablet 1 tablet, 1 tablet, Oral, QHS PRN, Orland Mustard, MD   sodium chloride flush (NS) 0.9 % injection 3-10 mL, 3-10 mL, Intravenous, Q12H, Orland Mustard, MD, 3 mL at 12/21/23 1533   sodium chloride flush (NS) 0.9 % injection 3-10 mL, 3-10 mL, Intravenous, PRN, Orland Mustard, MD  Vitals   Vitals:   12/21/23 1305 12/21/23 1409 12/21/23 1519 12/21/23 2000  BP: 133/88  (!) 148/91 131/88  Pulse: 76  88 94  Resp: 18  17 18   Temp: 98.5 F (36.9 C)  98.5 F (36.9 C) 99 F (37.2 C)  TempSrc: Oral  Oral Oral  SpO2: 100% 98% 98% 99%  Weight:        Body mass index is 35.7 kg/m.  Physical Exam   Constitutional: Appears well-developed and well-nourished.  Psych: Affect appropriate to situation.  Eyes: No scleral injection.  HENT: No OP obstruction.  Head: Normocephalic.  Cardiovascular: Normal rate and regular rhythm.  Respiratory: Effort normal, non-labored breathing.  GI: Soft.  No distension. There is no tenderness.  Skin: WDI.   Neurologic Examination   Temp:  [97.8 F (36.6 C)-99 F (37.2 C)] 99 F (37.2 C) (04/04 2000) Pulse Rate:  [64-94] 94 (04/04 2000) Resp:  [14-20] 18 (04/04 2000) BP: (110-148)/(59-91) 131/88 (04/04 2000) SpO2:  [91 %-100 %] 99 % (04/04 2000)  General - Well nourished, well developed, in no apparent distress.  Ophthalmologic - fundi not visualized due to noncooperation.  Cardiovascular - Regular rhythm and rate.  Mental Status -  Level of arousal and orientation to time, place, and person were intact. Language including expression, naming, repetition, comprehension was assessed and found intact. Attention span and concentration were normal. Recent and remote memory were intact. Fund  of Knowledge was assessed and was intact.  Cranial Nerves II - XII - II - Visual field intact OU. III, IV, VI - Extraocular movements intact. V - Facial sensation intact bilaterally. VII - Facial movement intact bilaterally. VIII - Hearing & vestibular intact bilaterally. X - Palate elevates symmetrically. XI - Chin turning & shoulder shrug intact bilaterally. XII - Tongue protrusion intact.  Motor Strength - The patient's strength was normal in all extremities and pronator drift was absent.  Bulk was normal and  fasciculations were absent.   Motor Tone - Muscle tone was assessed at the neck and appendages and was normal.  Reflexes - The patient's reflexes were symmetrical in all extremities and she had no pathological reflexes.  Sensory - Light touch, temperature/pinprick were assessed and were symmetrical.    Coordination - The patient had normal movements in the hands and feet with no ataxia or dysmetria.  Tremor was absent.  Gait and Station - deferred.    Labs/Imaging/Neurodiagnostic studies   CBC:  Recent Labs  Lab 12-21-2023 1947 12/21/23 1517  WBC 11.2* 7.7  NEUTROABS 6.5 4.4  HGB 14.5 13.7  HCT 40.0 38.5  MCV 88.3 88.7  PLT 276 255   Basic Metabolic Panel:  Lab Results  Component Value Date   NA 139 12/21/2023   K 3.2 (L) 12/21/2023   CO2 26 12/21/2023   GLUCOSE 142 (H) 12/21/2023   BUN 9 12/21/2023   CREATININE 0.85 12/21/2023   CALCIUM 9.4 12/21/2023   GFRNONAA >60 12/21/2023   GFRAA >60 06/15/2020   Lipid Panel: No results found for: "LDLCALC" HgbA1c:  Lab Results  Component Value Date   HGBA1C 5.0 12/21/2023   Urine Drug Screen:     Component Value Date/Time   LABOPIA POSITIVE (A) 21-Dec-2023 2048   COCAINSCRNUR NONE DETECTED 2023/12/21 2048   LABBENZ NONE DETECTED December 21, 2023 2048   AMPHETMU NONE DETECTED 12-21-2023 2048   THCU NONE DETECTED 12/21/2023 2048   LABBARB NONE DETECTED December 21, 2023 2048    Alcohol Level     Component Value  Date/Time   ETH <10 Dec 21, 2023 1947   INR  Lab Results  Component Value Date   INR 0.9 21-Dec-2023   APTT  Lab Results  Component Value Date   APTT 28 12/21/23   AED levels: No results found for: "PHENYTOIN", "ZONISAMIDE", "LAMOTRIGINE", "LEVETIRACETA"  CT Head without contrast(Personally reviewed): NAF.   CT angio Head and Neck with contrast(Personally reviewed): 1. No emergent large vessel occlusion or high-grade stenosis of the intracranial arteries. 2. Normal CTA of the neck.  MRI Brain(Personally reviewed): 3 mm focus of restricted diffusion within the medial right temporal lobe/hippocampus, likely reflecting an acute infarct. Of note, this finding can also be seen the setting of transient global amnesia. Partially empty sella turcica.    ASSESSMENT   Shakisha Abend is a 43 y.o. female with Hx of bipolar disorder who presents with "feeling hot and flushed" and binocular blurry vision, found to have restricted diffusion in the right temporal lobe/hippocampus, which can be seen with acute stroke vs transient global amnesia. Patient denies any memory or time loss. We will do full stroke workup with additional hypercoagulable labs given age of patient.    RECOMMENDATIONS  - 2D echo with bubble study - Continue ASA 81mg  daily - Start Atorvastatin 40mg  daily - LDL, A1c - telemetry - Hypercoagulable labs: factor V Leiden, protein c, Protein s, Antithrombin III, lupus anticoagulant, beta 2 glycoprotein, homocysteine, lipoprotein a,  - BP goal: normotension given onset of strokes >24hrs - OT ______________________________________________________________________    Katie Gleason, MD [facility]

## 2023-12-21 NOTE — Assessment & Plan Note (Signed)
 Continue celexa

## 2023-12-21 NOTE — Assessment & Plan Note (Signed)
 Nicotine patch   Encouraged cessation

## 2023-12-21 NOTE — Assessment & Plan Note (Signed)
 Diet and lifestyle changes.  A1c of 5.4 in 05/2023

## 2023-12-21 NOTE — H&P (Signed)
 History and Physical    Patient: Katie Spencer MVH:[id number] DOB: Jan 21, 1981 DOA: 12/20/2023 DOS: the patient was seen and examined on 12/21/2023 PCP: Loyal Jacobson, MD  Patient coming from:  [facility]  - lives with her husband. Ambulates independently.    Chief Complaint: vision disturbance, dizziness   HPI: Katie Spencer is a 43 y.o. female with medical history significant of bipolar 2, anxiety, remote cocaine abuse, tobacco abuse, s/p surgical menopause, back pain who presented to ED with complaints of vision changes and dizziness. Around 4:00pm she was putting together a chicken coop door. Around 4:15 she started to have blurred vision, tunnel vision. Around 5:45-6:00pm she had to take her kids to softball practice. Her vision was better, but she could see to drive. As soon as the kids got in the car her vision disturbance came back and she couldn't drive. She had some dizziness too. Her husband came to the parking lot. Her friend took her to an urgent care that was closed so they took her to the [facility] ER.   Her vision disturbance resolved this morning around 3am, but vision is still blurry. Dizziness is resolved. She had no weakness, focal deficits or slurred speech. She has some photophobia and discomfort with looking upwards.   She is adopted, does not know of any stroke history in her family.    Denies any fever/chills, chest pain or palpitations, shortness of breath or cough, abdominal pain, N/V/D, dysuria or leg swelling.    She smokes 1 PPD and does not drink.   ER Course:  vitals: afebrile, bp: 142/89, HR: 88, RR: 18 oxygen: 100%RA Pertinent labs: wbc: 11.2, potassium: 3.3,  CT head: no acute finding CTA head/neck: no LVO. Normal CTA of neck In ED: teleneuro recommended observing in hospital and getting brain MRI and starting ASA. [facility] asked to admit.   Review of Systems: As mentioned in the history of present illness. All other systems reviewed and are negative. Past  Medical History:  Diagnosis Date   Abnormal vaginal bleeding    Chronic abdominal pain    Chronic back pain    Renal disorder    Past Surgical History:  Procedure Laterality Date   ABDOMINAL HYSTERECTOMY     CESAREAN SECTION  2006, 2009, 2013, 2014   CHOLECYSTECTOMY     Social History:  reports that she has been smoking cigarettes. She has never used smokeless tobacco. She reports that she does not currently use alcohol. She reports that she does not use drugs.  Allergies  Allergen Reactions   Almond (Diagnostic) Itching and Rash   Diphenhydramine Other (See Comments)    Drowsy   Morphine And Codeine Hives, Itching, Swelling and Other (See Comments)    Hives/swelling/itching of arms    Nsaids Other (See Comments)    GI issues    Family History  Adopted: Yes  Problem Relation Age of Onset   Cancer Mother    Cancer Other     Prior to Admission medications   Medication Sig Start Date End Date Taking? Authorizing Provider  ABILIFY MAINTENA 400 MG PRSY prefilled syringe Inject 400 mg into the muscle every 21 ( twenty-one) days. 09/27/23  Yes [provider]  albuterol (PROVENTIL) (2.5 MG/3ML) 0.083% nebulizer solution Take 2.5 mg by nebulization as needed for wheezing or shortness of breath. 12/30/22  Yes [provider]  citalopram (CELEXA) 40 MG tablet Take 40 mg by mouth daily. 01/01/20  Yes [provider]  cyclobenzaprine (FLEXERIL) 10 MG tablet Take  10 mg by mouth 3 (three) times daily. 10/19/23  Yes [provider]  famotidine (PEPCID) 40 MG tablet Take 40 mg by mouth as needed for heartburn or indigestion. 11/17/21  Yes [provider]  fluticasone (FLONASE) 50 MCG/ACT nasal spray Place 2 sprays into both nostrils as needed for allergies. 12/02/20  Yes [provider]  gabapentin (NEURONTIN) 600 MG tablet Take 600 mg by mouth 3 (three) times daily. 05/19/22  Yes [provider]  HYDROcodone-acetaminophen (NORCO) 7.5-325  MG tablet Take 1 tablet by mouth 4 (four) times daily as needed for moderate pain (pain score 4-6). 11/22/23  Yes [provider]  ibuprofen (ADVIL) 600 MG tablet Take 600 mg by mouth 3 (three) times daily as needed for mild pain (pain score 1-3). 09/21/23  Yes [provider]  tamsulosin (FLOMAX) 0.4 MG CAPS capsule Take 1 capsule by mouth as needed (bladder). 05/26/21  Yes [provider]  dicyclomine (BENTYL) 20 MG tablet Take 1 tablet (20 mg total) by mouth 2 (two) times daily. Patient taking differently: Take 20 mg by mouth as needed for spasms. 03/10/23   Chase Caller, MD  hydrocortisone (ANUSOL-HC) 25 MG suppository Place 1 suppository (25 mg total) rectally 2 (two) times daily. Patient not taking: Reported on 12/21/2023 07/20/23   Charlynne Pander, MD  methocarbamol (ROBAXIN) 500 MG tablet Take 1 tablet (500 mg total) by mouth 2 (two) times daily. Patient not taking: Reported on 12/21/2023 03/25/23   Jeannie Fend, PA-C  omeprazole (PRILOSEC) 40 MG capsule Take 1 capsule (40 mg total) by mouth daily as needed (indigestion). 03/25/23   Jeannie Fend, PA-C  pantoprazole (PROTONIX) 40 MG tablet Take 1 tablet (40 mg total) by mouth 2 (two) times daily. Patient not taking: Reported on 12/21/2023 11/22/21 12/21/23  Redwine, Madison A, PA-C  potassium chloride SA (KLOR-CON M) 20 MEQ tablet Take 1 tablet (20 mEq total) by mouth 2 (two) times daily. Patient not taking: Reported on 12/21/2023 08/21/22   Dione Booze, MD  SUMAtriptan (IMITREX) 100 MG tablet Take 100 mg by mouth every 2 (two) hours as needed for migraine. 02/09/22   [provider]  belladonna-opium (B&O SUPPRETTES) 16.2-30 MG suppository Place 1 suppository rectally every 8 (eight) hours as needed for pain. Patient not taking: Reported on 05/27/2020 05/20/20 08/18/20  Gilda Crease, MD  lansoprazole (PREVACID) 30 MG capsule Take 1 capsule daily while taking ibuprofen. Patient not taking: Reported on  05/27/2020 04/16/20 08/18/20  Molpus, Jonny Ruiz, MD    Physical Exam: Vitals:   12/21/23 0809 12/21/23 1104 12/21/23 1305 12/21/23 1409  BP:  137/76 133/88   Pulse:  80 76   Resp:  14 18   Temp: 97.8 F (36.6 C) 98.8 F (37.1 C) 98.5 F (36.9 C)   TempSrc: Oral Oral Oral   SpO2:  100% 100% 98%  Weight:       General:  Appears calm and comfortable and is in NAD Eyes:  PERRL, EOMI, normal lids, iris ENT:  grossly normal hearing, lips & tongue, mmm; appropriate dentition Neck:  no LAD, masses or thyromegaly; no carotid bruits Cardiovascular:  RRR, no m/r/g. No LE edema.  Respiratory:   CTA bilaterally with no wheezes/rales/rhonchi.  Normal respiratory effort. Abdomen:  soft, NT, ND, NABS Back:   normal alignment, no CVAT Skin:  no rash or induration seen on limited exam Musculoskeletal:  grossly normal tone BUE/BLE, good ROM, no bony abnormality Lower extremity:  No LE edema.  Limited foot exam with no ulcerations.  2+ distal pulses. Psychiatric:  grossly normal mood and affect, speech fluent and appropriate, AOx3 Neurologic:  CN 2-12 grossly intact, moves all extremities in coordinated fashion, sensation intact. FTN intact bilaterally. HTK intact bilaterally. Gait deferred.    Radiological Exams on Admission: Independently reviewed - see discussion in A/P where applicable  MR BRAIN WO CONTRAST Result Date: 12/21/2023 CLINICAL DATA:  Provided history: Headache, neuro deficit. EXAM: MRI HEAD WITHOUT CONTRAST TECHNIQUE: Multiplanar, multiecho pulse sequences of the brain and surrounding structures were obtained without intravenous contrast. COMPARISON:  Noncontrast head CT and CT angiogram head/neck 12/20/2023. Brain MRI 06/11/2023. FINDINGS: Mild intermittent motion degradation. Brain: Cerebral volume is normal. 3 mm focus of restricted diffusion within the medial right temporal lobe/hippocampus (series 2, image 22). There are a few small nonspecific foci of T2 FLAIR hyperintense signal  abnormality scattered within the cerebral white matter, similar to the prior brain MRI of 06/11/2023. Partial empty sella turcica. No cortical encephalomalacia is identified. No evidence of an intracranial mass. No chronic intracranial blood products. No extra-axial fluid collection. No midline shift. Vascular: Maintained flow voids within the proximal large arterial vessels. Skull and upper cervical spine: No focal worrisome marrow lesion. Sinuses/Orbits: No mass or acute finding within the imaged orbits. No significant paranasal sinus disease. IMPRESSION: 1. Mildly motion degraded exam. 2. 3 mm focus of restricted diffusion within the medial right temporal lobe/hippocampus, likely reflecting an acute infarct. Of note, this finding can also be seen the setting of transient global amnesia. 3. There are a few small nonspecific chronic insults within the cerebral white matter, similar to the prior brain MRI of 06/11/2023. 4. Partially empty sella turcica. This finding can reflect incidental anatomic variation, or alternatively, it can be associated with chronic idiopathic intracranial hypertension (pseudotumor cerebri). Electronically Signed   By: Jackey Loge D.O.   On: 12/21/2023 13:46   CT ANGIO HEAD NECK W WO CM Result Date: 12/20/2023 CLINICAL DATA:  Dizziness, nausea and vision changes EXAM: CT ANGIOGRAPHY HEAD AND NECK WITH AND WITHOUT CONTRAST TECHNIQUE: Multidetector CT imaging of the head and neck was performed using the standard protocol during bolus administration of intravenous contrast. Multiplanar CT image reconstructions and MIPs were obtained to evaluate the vascular anatomy. Carotid stenosis measurements (when applicable) are obtained utilizing NASCET criteria, using the distal internal carotid diameter as the denominator. RADIATION DOSE REDUCTION: This exam was performed according to the departmental dose-optimization program which includes automated exposure control, adjustment of the mA and/or  kV according to patient size and/or use of iterative reconstruction technique. CONTRAST:  75mL OMNIPAQUE IOHEXOL 350 MG/ML SOLN COMPARISON:  None Available. FINDINGS: CTA NECK FINDINGS Skeleton: No acute abnormality or high grade bony spinal canal stenosis. Other neck: Normal pharynx, larynx and major salivary glands. No cervical lymphadenopathy. Unremarkable thyroid gland. Upper chest: No pneumothorax or pleural effusion. No nodules or masses. Aortic arch: There is no calcific atherosclerosis of the aortic arch. Conventional 3 vessel aortic branching pattern. RIGHT carotid system: Normal without aneurysm, dissection or stenosis. LEFT carotid system: Normal without aneurysm, dissection or stenosis. Vertebral arteries: Left dominant configuration. There is no dissection, occlusion or flow-limiting stenosis to the skull base (V1-V3 segments). CTA HEAD FINDINGS POSTERIOR CIRCULATION: Vertebral arteries are normal. No proximal occlusion of the anterior or inferior cerebellar arteries. Basilar artery is normal. Superior cerebellar arteries are normal. Posterior cerebral arteries are normal. ANTERIOR CIRCULATION: Intracranial internal carotid arteries are normal. Anterior cerebral arteries are normal. Middle cerebral arteries are  normal. Venous sinuses: As permitted by contrast timing, patent. Anatomic variants: None Review of the MIP images confirms the above findings. IMPRESSION: 1. No emergent large vessel occlusion or high-grade stenosis of the intracranial arteries. 2. Normal CTA of the neck. Electronically Signed   By: Deatra Robinson M.D.   On: 12/20/2023 22:31   CT HEAD CODE STROKE WO CONTRAST Result Date: 12/20/2023 CLINICAL DATA:  Code stroke. Acute neurologic deficit. Vision changes and dizziness. EXAM: CT HEAD WITHOUT CONTRAST TECHNIQUE: Contiguous axial images were obtained from the base of the skull through the vertex without intravenous contrast. RADIATION DOSE REDUCTION: This exam was performed according  to the departmental dose-optimization program which includes automated exposure control, adjustment of the mA and/or kV according to patient size and/or use of iterative reconstruction technique. COMPARISON:  04/21/2023 FINDINGS: Brain: There is no mass, hemorrhage or extra-axial collection. The size and configuration of the ventricles and extra-axial CSF spaces are normal. The brain parenchyma is normal, without evidence of acute or chronic infarction. Vascular: No abnormal hyperdensity of the major intracranial arteries or dural venous sinuses. No intracranial atherosclerosis. Skull: The visualized skull base, calvarium and extracranial soft tissues are normal. Sinuses/Orbits: No fluid levels or advanced mucosal thickening of the visualized paranasal sinuses. No mastoid or middle ear effusion. The orbits are normal. ASPECTS [facility] Stroke Program Early CT Score) - Ganglionic level infarction (caudate, lentiform nuclei, internal capsule, insula, M1-M3 cortex): 7 - Supraganglionic infarction (M4-M6 cortex): 3 Total score (0-10 with 10 being normal): 10 IMPRESSION: 1. No acute intracranial abnormality. 2. ASPECTS is 10. These results were called by telephone at the time of interpretation on 12/20/2023 at 8:03 pm to provider [facility] , who verbally acknowledged these results. Electronically Signed   By: Deatra Robinson M.D.   On: 12/20/2023 20:04    EKG: Independently reviewed.  NSR with rate 88; nonspecific ST changes with no evidence of acute ischemia   Labs on Admission: I have personally reviewed the available labs and imaging studies at the time of the admission.  Pertinent labs:   wbc: 11.2,  potassium: 3.3  Assessment and Plan: Principal Problem:   Acute CVA (cerebrovascular accident) (HCC) Active Problems:   Hypokalemia   Pre-diabetes   Bipolar 2 disorder (HCC)   Anxiety and depression   Lumbar radiculopathy   Tobacco abuse    Assessment and Plan: * Acute CVA (cerebrovascular  accident) (HCC) 43 year old presenting to ED after acute onset of vision disturbance on 12/19/23 around 4:15 with blurry vision, tunnel vision. It slightly improved then happened again around 6:00pm. This was accompanied by dizziness and nausea and prompted her to go to ED. MRI shows a 3 mm focus of restricted diffusion within the medial right temporal lobe/hippocampus likely reflecting an acute infarct.  -place in observation on telemetry for stroke work-up -Neurochecks per protocol -Neurology consulted with new findings on MRI  -CTA head and neck with no LVO -echo with bubble study ordered  -started on ASA 81mg , further anti platelets per neurology  -lipid panel and A1c -strongly encouraged her to stop smoking  -Permissive hypertension first 24 hours <220/110 -N.p.o. until bedside swallow screen-passed  -PT/ OT/ SLP consult   Hypokalemia Check magnesium Replete and trend   Pre-diabetes Diet and lifestyle changes.  A1c of 5.4 in 05/2023   Bipolar 2 disorder (HCC) Abilify injected q 21 days  She is due for this. She states it has been nearly 2 months since she had this.  Will see if  pharmacy can give while in patient   Anxiety and depression Continue celexa   Lumbar radiculopathy Per patient follows with pain management Continue home flexeril, gabapentin and PRN norco   Tobacco abuse Nicotine patch Encouraged cessation    Advance Care Planning:   Code Status: Full Code   Consults: neurology   DVT Prophylaxis: scd  Family Communication: none   Severity of Illness: The appropriate patient status for this patient is OBSERVATION. Observation status is judged to be reasonable and necessary in order to provide the required intensity of service to ensure the patient's safety. The patient's presenting symptoms, physical exam findings, and initial radiographic and laboratory data in the context of their medical condition is felt to place them at decreased risk for further  clinical deterioration. Furthermore, it is anticipated that the patient will be medically stable for discharge from the hospital within 2 midnights of admission.   Author: Orland Mustard, MD 12/21/2023 2:41 PM  For on call review [email or web address].

## 2023-12-21 NOTE — Assessment & Plan Note (Addendum)
 43 year old presenting to ED after acute onset of vision disturbance on 12/19/23 around 4:15 with blurry vision, tunnel vision. It slightly improved then happened again around 6:00pm. This was accompanied by dizziness and nausea and prompted her to go to ED. MRI shows a 3 mm focus of restricted diffusion within the medial right temporal lobe/hippocampus likely reflecting an acute infarct.  -place in observation on telemetry for stroke work-up -Neurochecks per protocol -Neurology consulted with new findings on MRI  -CTA head and neck with no LVO -echo with bubble study ordered  -started on ASA 81mg , further anti platelets per neurology  -lipid panel and A1c -strongly encouraged her to stop smoking  -Permissive hypertension first 24 hours <220/110 -N.p.o. until bedside swallow screen-passed  -PT/ OT/ SLP consult

## 2023-12-21 NOTE — Assessment & Plan Note (Addendum)
 Abilify injected q 21 days  She is due for this. She states it has been nearly 2 months since she had this.  Will see if pharmacy can give while in patient

## 2023-12-21 NOTE — Assessment & Plan Note (Signed)
Check magnesium Replete and trend  

## 2023-12-22 ENCOUNTER — Telehealth: Payer: Self-pay | Admitting: Physician Assistant

## 2023-12-22 ENCOUNTER — Inpatient Hospital Stay (HOSPITAL_COMMUNITY): Payer: MEDICAID

## 2023-12-22 ENCOUNTER — Other Ambulatory Visit (HOSPITAL_COMMUNITY): Payer: MEDICAID

## 2023-12-22 DIAGNOSIS — I6389 Other cerebral infarction: Secondary | ICD-10-CM | POA: Diagnosis not present

## 2023-12-22 DIAGNOSIS — I639 Cerebral infarction, unspecified: Secondary | ICD-10-CM | POA: Diagnosis present

## 2023-12-22 DIAGNOSIS — F3181 Bipolar II disorder: Secondary | ICD-10-CM | POA: Diagnosis not present

## 2023-12-22 DIAGNOSIS — E785 Hyperlipidemia, unspecified: Secondary | ICD-10-CM | POA: Diagnosis not present

## 2023-12-22 DIAGNOSIS — Z720 Tobacco use: Secondary | ICD-10-CM

## 2023-12-22 DIAGNOSIS — F1421 Cocaine dependence, in remission: Secondary | ICD-10-CM | POA: Diagnosis not present

## 2023-12-22 DIAGNOSIS — F17210 Nicotine dependence, cigarettes, uncomplicated: Secondary | ICD-10-CM

## 2023-12-22 LAB — ECHOCARDIOGRAM COMPLETE BUBBLE STUDY
Area-P 1/2: 2.87 cm2
S' Lateral: 3 cm

## 2023-12-22 LAB — LIPID PANEL
Cholesterol: 153 mg/dL (ref 0–200)
HDL: 33 mg/dL — ABNORMAL LOW (ref >40)
LDL Cholesterol: 88 mg/dL (ref 0–99)
Total CHOL/HDL Ratio: 4.6 ratio
Triglycerides: 158 mg/dL — ABNORMAL HIGH (ref <150)
VLDL: 32 mg/dL (ref 0–40)

## 2023-12-22 LAB — BASIC METABOLIC PANEL WITH GFR
Anion gap: 7 (ref 5–15)
BUN: 13 mg/dL (ref 6–20)
CO2: 24 mmol/L (ref 22–32)
Calcium: 9.3 mg/dL (ref 8.9–10.3)
Chloride: 108 mmol/L (ref 98–111)
Creatinine, Ser: 0.79 mg/dL (ref 0.44–1.00)
GFR, Estimated: >60 mL/min (ref >60)
Glucose, Bld: 113 mg/dL — ABNORMAL HIGH (ref 70–99)
Potassium: 4 mmol/L (ref 3.5–5.1)
Sodium: 139 mmol/L (ref 135–145)

## 2023-12-22 LAB — ANTITHROMBIN III: AntiThromb III Func: 102 % (ref 75–120)

## 2023-12-22 MED ORDER — IBUPROFEN 200 MG PO TABS: 200.0000 mg | ORAL_TABLET | Freq: Once | ORAL | Status: DC

## 2023-12-22 MED ORDER — ATORVASTATIN CALCIUM 40 MG PO TABS: 40.0000 mg | ORAL_TABLET | Freq: Every day | ORAL | 11 refills | Status: AC

## 2023-12-22 MED ORDER — NICOTINE 21 MG/24HR TD PT24: 21.0000 mg | MEDICATED_PATCH | Freq: Every day | TRANSDERMAL | 0 refills | Status: AC

## 2023-12-22 MED ORDER — ASPIRIN 81 MG PO TBEC: 81.0000 mg | DELAYED_RELEASE_TABLET | Freq: Every day | ORAL | 12 refills | Status: AC

## 2023-12-22 NOTE — Progress Notes (Signed)
 VASCULAR LAB    TCD bubble study has been performed.  See CV proc for preliminary results.   Ritchie Klee, RVT 12/22/2023, 2:28 PM

## 2023-12-22 NOTE — Telephone Encounter (Signed)
 Dr. Blake Divine with IM reached out to request 30 day monitor on this patient for stroke. Order placed under Dr. Tenny Craw per protocol (DOD). I asked Dr. Blake Divine to let patient know it will be mailed to her home. I also outlined this FYI on AVS.   Dr. Blake Divine also recommends outpatient TEE, will need a formal new patient appointment to review and arrange. Separate message sent via staff msg to scheduler team to arrange. This will be requested sooner than the standard post-monitor review timeline given request for TEE.

## 2023-12-22 NOTE — Progress Notes (Signed)
 Patient alert and oriented, verbalized understanding of dc instructions. All belongings and paperwork given to patient. Will transfer patient to the dc lounge to wait for family/ride.

## 2023-12-22 NOTE — Progress Notes (Signed)
Patient off the unit to vascular lab

## 2023-12-22 NOTE — Progress Notes (Signed)
Patient back to the unit.

## 2023-12-22 NOTE — Evaluation (Signed)
 Occupational Therapy Evaluation Patient Details Name: Katie Spencer MRN: [id number] DOB: 04/26/1981 Today's Date: 12/22/2023   History of Present Illness   Pt is a 43 y.o. female presenting 12/20/2023 presenting with visual changes and dizziness. MRI with 3 mm focus of restricted diffusion within the medial right temporal lobe/hippocampus, likely reflecting an acute infarct. PMH significant for bipolar 2, anxiety, remote cocaine abuse, tobacco abuse, surgical menopause, back pain.     Clinical Impressions PTA, pt reports she lived with her family and was mod I for ADL and IADL. Upon eval, pt with blurry vision, eye pain with visual tracking toward end ranges on L and R, and inconsistent visual report during visual fields testing in the LUQ. Pt needing up to CGA during ADL secondary to decreased balance. Pt to continue to benefit from acute OT services and recommend discharge home with OP OT for follow up.      If plan is discharge home, recommend the following:   A little help with walking and/or transfers;A little help with bathing/dressing/bathroom;Assistance with cooking/housework;Assist for transportation;Help with stairs or ramp for entrance     Functional Status Assessment   Patient has had a recent decline in their functional status and demonstrates the ability to make significant improvements in function in a reasonable and predictable amount of time.     Equipment Recommendations   BSC/3in1     Recommendations for Other Services         Precautions/Restrictions   Precautions Precautions: Fall Recall of Precautions/Restrictions: Intact Restrictions Weight Bearing Restrictions Per Provider Order: No     Mobility Bed Mobility               General bed mobility comments: OOB in chair    Transfers Overall transfer level: Needs assistance Equipment used: None Transfers: Sit to/from Stand Sit to Stand: Contact guard assist           General  transfer comment: due to instability with standing.      Balance Overall balance assessment: Needs assistance Sitting-balance support: Single extremity supported, Feet supported Sitting balance-Leahy Scale: Good     Standing balance support: No upper extremity supported, During functional activity Standing balance-Leahy Scale: Fair Standing balance comment: CGA for safety during gait                           ADL either performed or assessed with clinical judgement   ADL Overall ADL's : Needs assistance/impaired Eating/Feeding: Independent   Grooming: Supervision/safety;Standing   Upper Body Bathing: Sitting;Modified independent   Lower Body Bathing: Contact guard assist;Sit to/from stand   Upper Body Dressing : Modified independent;Sitting   Lower Body Dressing: Sit to/from stand;Contact guard assist   Toilet Transfer: Contact guard assist;Ambulation   Toileting- Clothing Manipulation and Hygiene: Sit to/from stand;Contact guard assist       Functional mobility during ADLs: Contact guard assist       Vision Baseline Vision/History: 0 No visual deficits (has glasses but does not wear) Ability to See in Adequate Light: 0 Adequate Patient Visual Report: Blurring of vision Vision Assessment?: Yes Ocular Range of Motion: Within Functional Limits Alignment/Gaze Preference: Within Defined Limits Tracking/Visual Pursuits: Other (comment);Impaired - to be further tested in functional context (Pt with increased blinking and report of eye pain at all end ranges esp on the L) Saccades: Decreased speed of saccadic movement Visual Fields:  (L superior quadrant with inconsistent responses) Diplopia Assessment:  (denies, still blurred vision with  one eye closed)     Perception         Praxis         Pertinent Vitals/Pain Pain Assessment Pain Assessment: Faces Faces Pain Scale: Hurts little more Pain Location: low back pain Pain Descriptors / Indicators:  Aching Pain Intervention(s): Limited activity within patient's tolerance, Monitored during session     Extremity/Trunk Assessment Upper Extremity Assessment Upper Extremity Assessment: Overall WFL for tasks assessed   Lower Extremity Assessment Lower Extremity Assessment: Defer to PT evaluation   Cervical / Trunk Assessment Cervical / Trunk Assessment: Normal   Communication Communication Communication: No apparent difficulties   Cognition Arousal: Alert Behavior During Therapy: WFL for tasks assessed/performed Cognition: No family/caregiver present to determine baseline, Cognition impaired     Awareness: Intellectual awareness intact, Online awareness impaired Memory impairment (select all impairments): Short-term memory Attention impairment (select first level of impairment): Selective attention Executive functioning impairment (select all impairments): Problem solving OT - Cognition Comments: able to perform basic problem solving but needs A with more complex problem solving. pt reports poor short term memory at baseline. Pt able to locate 3 items in hall with min cues but unable to recall order.                 Following commands: Intact       Cueing  General Comments   Cueing Techniques: Verbal cues  No noted skin issues. Pt was able to perform smooth pursuits with reports of pain when looking up behind the eyes and had ~8 inche with conversion where she started to have diplopia.   Exercises     Shoulder Instructions      Home Living Family/patient expects to be discharged to:: Private residence Living Arrangements: Spouse/significant other;Children (11 y.o. and 88 y.o.) Available Help at Discharge: Family;Available PRN/intermittently Type of Home: House Home Access: Stairs to enter [employer] of Steps: 1 step and threshold Entrance Stairs-Rails: None Home Layout: Two level Alternate Level Stairs-Number of Steps: 17 stairs Alternate Level  Stairs-Rails: Right Bathroom Shower/Tub: Chief Strategy Officer: Standard Bathroom Accessibility: Yes   Home Equipment: None          Prior Functioning/Environment Prior Level of Function : Independent/Modified Independent;Working/employed;Driving             Mobility Comments: Pt reports ind with gait. [employer]. ADLs Comments: Pt reports independence with ADLs and IADLs.    OT Problem List: Decreased strength;Impaired balance (sitting and/or standing);Impaired vision/perception;Decreased activity tolerance;Decreased cognition;Decreased knowledge of use of DME or AE   OT Treatment/Interventions: Self-care/ADL training;Therapeutic exercise;DME and/or AE instruction;Balance training;Patient/family education;Therapeutic activities;Visual/perceptual remediation/compensation;Cognitive remediation/compensation      OT Goals(Current goals can be found in the care plan section)   Acute Rehab OT Goals Patient Stated Goal: go home OT Goal Formulation: With patient Time For Goal Achievement: 01/05/24 Potential to Achieve Goals: Good   OT Frequency:  Min 1X/week    Co-evaluation              AM-PAC OT "6 Clicks" Daily Activity     Outcome Measure Help from another person eating meals?: None Help from another person taking care of personal grooming?: A Little Help from another person toileting, which includes using toliet, bedpan, or urinal?: A Little Help from another person bathing (including washing, rinsing, drying)?: A Little Help from another person to put on and taking off regular upper body clothing?: None Help from another person to put on and taking off regular  lower body clothing?: A Little 6 Click Score: 20   End of Session Equipment Utilized During Treatment: Gait belt Nurse Communication: Mobility status  Activity Tolerance: Patient tolerated treatment well Patient left: in bed;with call bell/phone within reach;with chair alarm  set  OT Visit Diagnosis: Unsteadiness on feet (R26.81);Muscle weakness (generalized) (M62.81);Low vision, both eyes (H54.2)                Time: 1191-4782 OT Time Calculation (min): 23 min Charges:  OT General Charges $OT Visit: 1 Visit OT Evaluation $OT Eval Low Complexity: 1 Low OT Treatments $Self Care/Home Management : 8-22 mins  Tyler Deis, OTR/L [facility] Acute Rehabilitation Office: [phone]   Myrla Halsted 12/22/2023, 1:14 PM

## 2023-12-22 NOTE — Progress Notes (Signed)
 SLP Cancellation Note  Patient Details Name: Katie Spencer MRN: [id number] DOB: 16-Dec-1980   Cancelled treatment:        Pt attempted to be seen this AM, out at the [facility]CHO [facility]. SLP to f/u as time permits.   Dione Housekeeper M.S. CCC-SLP

## 2023-12-22 NOTE — Evaluation (Signed)
 Speech Language Pathology Evaluation Patient Details Name: Katie Spencer MRN: [id number] DOB: 1981-07-08 Today's Date: 12/22/2023 Time: 1308-6578 SLP Time Calculation (min) (ACUTE ONLY): 18 min  Problem List:  Patient Active Problem List   Diagnosis Date Noted   CVA (cerebral vascular accident) (HCC) 12/22/2023   Hypokalemia 12/21/2023   Tobacco abuse 12/21/2023   Acute CVA (cerebrovascular accident) (HCC) 12/20/2023   Anxiety and depression 11/01/2022   Pre-diabetes 02/02/2021   Acute bilateral low back pain without sciatica 01/10/2021   Lumbar radiculopathy 10/06/2020   Wrist sprain, left, subsequent encounter 09/22/2020   Bipolar 2 disorder (HCC) 12/20/2015   Past Medical History:  Past Medical History:  Diagnosis Date   Abnormal vaginal bleeding    Chronic abdominal pain    Chronic back pain    Renal disorder    Past Surgical History:  Past Surgical History:  Procedure Laterality Date   ABDOMINAL HYSTERECTOMY     CESAREAN SECTION  2006, 2009, 2013, 2014   CHOLECYSTECTOMY     HPI:  Pt is a 43 y.o. female presenting 12/20/2023 presenting with visual changes and dizziness. MRI with 3 mm focus of restricted diffusion within the medial right temporal lobe/hippocampus, likely reflecting an acute infarct. PMH significant for bipolar 2, anxiety, remote cocaine abuse, tobacco abuse, surgical menopause, back pain. Pt is a stay at home mom, drives and independently manages iADLs and ADLs   Assessment / Plan / Recommendation Clinical Impression  Pt seen for cognitive-linguistic assessment following possible CVA event. The pt was administered the SLUMS and scored a 24/30, which is indicative of a mild cognitive impairment. Per the pt's reports, she feels she has mainly returned to cognitive baseline but expressed concerns for her dual attention when in conversation with the SLP. The pt also reported when a similar medical episode occurred last year, she had no recall of the events or  being taken to the hospital which she did not share with the neurologist. The pt's noted areas of strength were orientation, immediate recall, visuo-spatial attention, executive function. Areas of weaknesses include numeric calculation, verbal attention, and delayed recall. The pt was able to answer delayed recall questions given min verbal cues and delayed processing speed, displaying great emergent attention and recall skills. Pt would benefit from one more visit with ST to determine if she has made a return to baseline and education on approaches and strategies for iADLs if impairments remain.    SLP Assessment  SLP Recommendation/Assessment: Patient needs continued Speech Lanaguage Pathology Services SLP Visit Diagnosis: Cognitive communication deficit (R41.841)    Recommendations for follow up therapy are one component of a multi-disciplinary discharge planning process, led by the attending physician.  Recommendations may be updated based on patient status, additional functional criteria and insurance authorization.    Follow Up Recommendations       Assistance Recommended at Discharge     Functional Status Assessment    Frequency and Duration min 1 x/week  1 week      SLP Evaluation Cognition  Orientation Level: Oriented X4 Year: 2025 Month: April Day of Week: Incorrect Attention: Focused;Sustained;Divided Focused Attention: Appears intact Sustained Attention: Impaired Sustained Attention Impairment: Functional complex;Verbal complex Divided Attention: Impaired Divided Attention Impairment: Verbal complex;Functional complex Memory: Impaired Memory Impairment: Decreased recall of new information;Decreased short term memory Decreased Short Term Memory: Verbal complex Awareness: Appears intact Problem Solving: Appears intact Executive Function: Organizing;Self Correcting;Self Monitoring Organizing: Appears intact Self Monitoring: Appears intact Self Correcting: Appears  intact Safety/Judgment: Appears intact  Comprehension  Auditory Comprehension Overall Auditory Comprehension: Appears within functional limits for tasks assessed Visual Recognition/Discrimination Discrimination: Within Function Limits Reading Comprehension Reading Status: Not tested    Expression Expression Primary Mode of Expression: Verbal Verbal Expression Overall Verbal Expression: Appears within functional limits for tasks assessed Initiation: No impairment Written Expression Dominant Hand: Right Written Expression: Not tested   Oral / Motor  Motor Speech Overall Motor Speech: Appears within functional limits for tasks assessed           Dione Housekeeper M.S. CCC-SLP

## 2023-12-22 NOTE — Progress Notes (Signed)
  Echocardiogram 2D Echocardiogram has been performed.  Delcie Roch 12/22/2023, 10:33 AM

## 2023-12-22 NOTE — Evaluation (Signed)
 Physical Therapy Evaluation Patient Details Name: Katie Spencer MRN: [id number] DOB: 08/17/1981 Today's Date: 12/22/2023  History of Present Illness  Pt is a 43 y.o. female presenting 12/20/2023 presenting with visual changes and dizziness. MRI with 3 mm focus of restricted diffusion within the medial right temporal lobe/hippocampus, likely reflecting an acute infarct. PMH significant for bipolar 2, anxiety, remote cocaine abuse, tobacco abuse, surgical menopause, back pain.  Clinical Impression  Pt is presenting below baseline level of functioning. Prior to hospitalization pt was ind with all activities and working/driving. Currently pt requires Min A for sit to stand, gait and stairs per home set up at [facility] today due to impaired balance. Pt requires frequent rest breaks in order to decrease dizziness and feelings of lightheadedness. Due to pt current functional status, home set up and available assistance at home recommending skilled physical therapy services 3x/week in order to address balance and functional mobility to decrease risk for falls, injury and re-hospitalization.           If plan is discharge home, recommend the following: A little help with walking and/or transfers;Assistance with cooking/housework;Assist for transportation;Help with stairs or ramp for entrance     Equipment Recommendations Rolling walker (2 wheels)     Functional Status Assessment Patient has had a recent decline in their functional status and demonstrates the ability to make significant improvements in function in a reasonable and predictable amount of time.     Precautions / Restrictions Precautions Precautions: Fall Recall of Precautions/Restrictions: Intact Restrictions Weight Bearing Restrictions Per Provider Order: No      Mobility  Bed Mobility Overal bed mobility: Modified Independent             General bed mobility comments: light use of rails. Mild lightheadedness that improves  quickly    Transfers Overall transfer level: Needs assistance Equipment used: None Transfers: Sit to/from Stand Sit to Stand: Contact guard assist           General transfer comment: due to instability with standing.    Ambulation/Gait Ambulation/Gait assistance: Min assist Gait Distance (Feet): 150 Feet Assistive device: None, 1 person hand held assist Gait Pattern/deviations: Step-through pattern, Decreased step length - left, Decreased step length - right, Decreased dorsiflexion - left, Decreased dorsiflexion - right, Wide base of support Gait velocity: decreased Gait velocity interpretation: <1.8 ft/sec, indicate of risk for recurrent falls   General Gait Details: decreased knee flexion with gait with wide stance and short step length bil with lateral movement due to decreased knee flexion. Pt would become dizzy with increased activity and have to stop/close her eyes and would improve after ~5 seconds. Pt reported flushed head after gait.  Stairs Stairs: Yes Stairs assistance: Min assist Stair Management: Forwards, No rails Number of Stairs: 1 General stair comments: HHA for 1 step up Min A for balance.   Modified Rankin (Stroke Patients Only) Modified Rankin (Stroke Patients Only) Pre-Morbid Rankin Score: No symptoms Modified Rankin: Slight disability     Balance Overall balance assessment: Needs assistance Sitting-balance support: Single extremity supported, Feet supported Sitting balance-Leahy Scale: Good     Standing balance support: Single extremity supported, No upper extremity supported, During functional activity Standing balance-Leahy Scale: Poor Standing balance comment: Pt requires UE support to maintain balance without external assistance during gait         Pertinent Vitals/Pain Pain Assessment Pain Assessment: 0-10 Pain Score: 7  Pain Location: low back pain Pain Descriptors / Indicators: Aching Pain Intervention(s): Monitored during  session, Limited activity within patient's tolerance, Other (comment) (pt takes pain meds at home and states she knows when her next pain meds are due.)    Home Living Family/patient expects to be discharged to:: Private residence Living Arrangements: Spouse/significant other;Children (11 and 36 yo) Available Help at Discharge: Family;Available PRN/intermittently Type of Home: House Home Access: Stairs to enter Entrance Stairs-Rails: None Entrance Stairs-Number of Steps: 1 step and threshold Alternate Level Stairs-Number of Steps: 17 stairs Home Layout: Two level Home Equipment: None      Prior Function Prior Level of Function : Independent/Modified Independent;Working/employed;Driving             Mobility Comments: Pt reports ind with gait. [employer]. ADLs Comments: Pt reports independence with ADL"s and IADL's.     Extremity/Trunk Assessment   Upper Extremity Assessment Upper Extremity Assessment: Overall WFL for tasks assessed    Lower Extremity Assessment Lower Extremity Assessment: Overall WFL for tasks assessed    Cervical / Trunk Assessment Cervical / Trunk Assessment: Normal  Communication   Communication Communication: No apparent difficulties    Cognition Arousal: Alert Behavior During Therapy: WFL for tasks assessed/performed   PT - Cognitive impairments: No apparent impairments       Following commands: Intact       Cueing Cueing Techniques: Verbal cues     General Comments General comments (skin integrity, edema, etc.): No noted skin issues. Pt was able to perform smooth pursuits with reports of pain when looking up behind the eyes and had ~8 inche with conversion where she started to have diplopia.        Assessment/Plan    PT Assessment Patient needs continued PT services  PT Problem List Decreased strength;Decreased mobility;Decreased balance;Decreased activity tolerance       PT Treatment Interventions DME  instruction;Therapeutic exercise;Gait training;Balance training;Stair training;Neuromuscular re-education;Functional mobility training;Therapeutic activities;Patient/family education    PT Goals (Current goals can be found in the Care Plan section)  Acute Rehab PT Goals Patient Stated Goal: to improve balance PT Goal Formulation: With patient Time For Goal Achievement: 01/05/24 Potential to Achieve Goals: Good    Frequency Min 3X/week        AM-PAC PT "6 Clicks" Mobility  Outcome Measure Help needed turning from your back to your side while in a flat bed without using bedrails?: A Little Help needed moving from lying on your back to sitting on the side of a flat bed without using bedrails?: A Little Help needed moving to and from a bed to a chair (including a wheelchair)?: A Little Help needed standing up from a chair using your arms (e.g., wheelchair or bedside chair)?: A Little Help needed to walk in hospital room?: A Little Help needed climbing 3-5 steps with a railing? : A Little 6 Click Score: 18    End of Session Equipment Utilized During Treatment: Gait belt Activity Tolerance: Patient tolerated treatment well Patient left: in bed;Other (comment) (transport took patient right away) Nurse Communication: Mobility status PT Visit Diagnosis: Unsteadiness on feet (R26.81);Other abnormalities of gait and mobility (R26.89);Dizziness and giddiness (R42)    Time: 9604-5409 PT Time Calculation (min) (ACUTE ONLY): 18 min   Charges:   PT Evaluation $PT Eval Low Complexity: 1 Low   PT General Charges $$ ACUTE PT VISIT: 1 Visit        Harrel Carina, DPT, CLT  Acute Rehabilitation Services Office: [phone] (Secure chat preferred)   Claudia Desanctis 12/22/2023, 9:55 AM

## 2023-12-22 NOTE — Progress Notes (Signed)
Patient off the unit to vascular.

## 2023-12-22 NOTE — Plan of Care (Signed)
  Problem: Education: Goal: Knowledge of General Education information will improve Description: Including pain rating scale, medication(s)/side effects and non-pharmacologic comfort measures Outcome: Progressing   Problem: Education: Goal: Knowledge of disease or condition will improve Outcome: Progressing Goal: Knowledge of secondary prevention will improve (MUST DOCUMENT ALL) Outcome: Progressing Goal: Knowledge of patient specific risk factors will improve (DELETE if not current risk factor) Outcome: Progressing   Problem: Ischemic Stroke/TIA Tissue Perfusion: Goal: Complications of ischemic stroke/TIA will be minimized Outcome: Progressing

## 2023-12-22 NOTE — Progress Notes (Signed)
 STROKE TEAM PROGRESS NOTE   BRIEF HPI Ms. Katie Spencer is a 43 y.o. female with history of bipolar 2 disorder, OSA on CPAP, remote crack cocaine use- quit in 2017, MDD, and tobacco use presenting with blurry vision from OSH with findings of acute stroke on MRI brain.   SIGNIFICANT HOSPITAL EVENTS 4/3: No emergent LVO or high-grade stenosis of the intracranial arteries.  Normal CTA of the neck.  4/4: MRI brain with 3 mm focus of restricted diffusion within the medial right temporal lobe/hippocampus likely reflecting an acute infarct.    INTERIM HISTORY/SUBJECTIVE Patient sitting up at bedside, no family present in the room during exam.  No acute distress, no complaints.  Patient does state that she has some ongoing blurred vision that is not back to baseline 100% but improved from onset.  At onset, patient reports blurry, tunneled vision with horizontal diplopia.  The tunnel vision and diplopia have resolved but the blurriness is persistent though improved. She endorses having a similar episode in the past about 1 year ago with workup at that time as well.  Providers at that time felt her presentation represented a complex migraine headache.   Patient reports a history of migraine headaches with spots in her vision that were occurring frequently prior to having her 2 children.  After she had her children, the migraine headaches resolved but then returned again approximately 2 years ago and happen once every 3 to 6 months with her last reported migraine headache in September 2024.  She has seen a neurologist who prescribed her sumatriptan and Aimovig and recommended her for OSA testing with recommendations for CPAP.   OBJECTIVE CBC    Component Value Date/Time   WBC 7.7 12/21/2023 1517   RBC 4.34 12/21/2023 1517   HGB 13.7 12/21/2023 1517   HCT 38.5 12/21/2023 1517   PLT 255 12/21/2023 1517   MCV 88.7 12/21/2023 1517   MCH 31.6 12/21/2023 1517   MCHC 35.6 12/21/2023 1517   RDW 11.5  12/21/2023 1517   LYMPHSABS 2.6 12/21/2023 1517   MONOABS 0.4 12/21/2023 1517   EOSABS 0.2 12/21/2023 1517   BASOSABS 0.1 12/21/2023 1517   BMET    Component Value Date/Time   NA 139 12/21/2023 1517   K 3.2 (L) 12/21/2023 1517   CL 105 12/21/2023 1517   CO2 26 12/21/2023 1517   GLUCOSE 142 (H) 12/21/2023 1517   BUN 9 12/21/2023 1517   CREATININE 0.85 12/21/2023 1517   CALCIUM 9.4 12/21/2023 1517   GFRNONAA >60 12/21/2023 1517   Lab Results  Component Value Date   HGBA1C 5.0 12/21/2023   No results found for: "CHOL", "HDL", "LDLCALC", "LDLDIRECT", "TRIG", "CHOLHDL"  IMAGING past 24 hours MR BRAIN WO CONTRAST Result Date: 12/21/2023 CLINICAL DATA:  Provided history: Headache, neuro deficit. EXAM: MRI HEAD WITHOUT CONTRAST TECHNIQUE: Multiplanar, multiecho pulse sequences of the brain and surrounding structures were obtained without intravenous contrast. COMPARISON:  Noncontrast head CT and CT angiogram head/neck 12/20/2023. Brain MRI 06/11/2023. FINDINGS: Mild intermittent motion degradation. Brain: Cerebral volume is normal. 3 mm focus of restricted diffusion within the medial right temporal lobe/hippocampus (series 2, image 22). There are a few small nonspecific foci of T2 FLAIR hyperintense signal abnormality scattered within the cerebral white matter, similar to the prior brain MRI of 06/11/2023. Partial empty sella turcica. No cortical encephalomalacia is identified. No evidence of an intracranial mass. No chronic intracranial blood products. No extra-axial fluid collection. No midline shift. Vascular: Maintained flow voids within the proximal  large arterial vessels. Skull and upper cervical spine: No focal worrisome marrow lesion. Sinuses/Orbits: No mass or acute finding within the imaged orbits. No significant paranasal sinus disease. IMPRESSION: 1. Mildly motion degraded exam. 2. 3 mm focus of restricted diffusion within the medial right temporal lobe/hippocampus, likely reflecting an  acute infarct. Of note, this finding can also be seen the setting of transient global amnesia. 3. There are a few small nonspecific chronic insults within the cerebral white matter, similar to the prior brain MRI of 06/11/2023. 4. Partially empty sella turcica. This finding can reflect incidental anatomic variation, or alternatively, it can be associated with chronic idiopathic intracranial hypertension (pseudotumor cerebri). Electronically Signed   By: Jackey Loge D.O.   On: 12/21/2023 13:46   Vitals:   12/21/23 2000 12/21/23 2347 12/22/23 0350 12/22/23 0745  BP: 131/88 132/75 133/76 124/73  Pulse: 94 74 73 79  Resp: 18 18 18 18   Temp: 99 F (37.2 C) 97.6 F (36.4 C) 98.4 F (36.9 C) 99.2 F (37.3 C)  TempSrc: Oral Oral Oral Oral  SpO2: 99% 97% 98% 96%  Weight:       PHYSICAL EXAM General:  Alert, well-nourished, well-developed Caucasian female in no acute distress Psych:  Mood and affect appropriate for situation. Calm and cooperative throughout examination CV: Regular rate and rhythm on monitor Respiratory:  Regular, unlabored respirations on room air GI: Abdomen soft and nontender  NEURO:  Mental Status: AA&Ox3, patient is able to give clear and coherent history Speech/Language: speech is without dysarthria or aphasia.  Naming, repetition, fluency, and comprehension intact.  Cranial Nerves:  II: PERRL. Visual fields full.  Patient reports some residual blurry vision without limitation of visual fields. III, IV, VI: EOMI. Eyelids elevate symmetrically.  No diplopia. V: Sensation is intact to light touch and symmetrical to face.  VII: Face is symmetrical resting and and with movement VIII: Hearing intact to voice. IX, X: Palate elevates symmetrically. Phonation is normal.  XI: Shoulder shrug 5/5. XII: Tongue protrudes midline Motor: 5/5 strength to all muscle groups tested.  Tone: is normal and bulk is normal Sensation: Intact to light touch bilaterally. Extinction absent to  light touch to DSS.   Coordination: FTN intact bilaterally, HKS: no ataxia in BLE.No drift.  Gait: Deferred (OT reports decreased balance with recommendation for use of a rolling walker with ambulation)  ASSESSMENT/PLAN Acute Ischemic Infarct: Small area of restricted diffusion within the medial right temporal lobe/hippocampus likely representing an acute infarct.  Infarct is likely an incidental finding as patient's symptoms do not correlate with location of infarction on MRI.  Patient's presenting symptoms are felt more likely to represent a complex migraine headache with history of such and similar presentations in the past without acute findings on previous work ups.  Etiology:  Likely small vessel disease versus embolism   Code Stroke: No acute intracranial abnormality.  Aspects is 10. CTA head & neck: No emergent LVO or high-grade stenosis of the intracranial arteries.  Normal CTA of the neck. MRI mildly motion degraded exam.  3 mm focus of restricted diffusion within the medial right temporal lobe/hippocampus, likely reflecting an acute infarct.  Of note, this finding can also be seen in the setting of transient global amnesia.  There are a few small nonspecific chronic insults within the cerebral white matter, similar to prior MRI brain 05/22/2023.  Partially empty sella turcica.  This finding can reflect incidental anatomic variation, or alternatively, it can be associated with chronic idiopathic intracranial hypertension.  2D Echo LVEF 70-75%  LDL No results found for requested labs within last 1095 days. TCD bubble study ordered, pending TEE to be scheduled outpatient as no further indication for patient to stay throughout the weekend for testing on Monday and patient is otherwise stable.  She will also need 30-day heart monitor for paroxysmal A-fib as outpatient HgbA1c 5.0 VTE prophylaxis - SCDs No antithrombotic prior to admission, now on aspirin 81 mg daily long-term  Therapy  recommendations:  Outpatient OT and PT Disposition:  Pending discharge home   Hyperlipidemia Home meds:  none LDL 88, goal < 70 Add atorvastatin 40 mg PO daily   Continue statin at discharge  Tobacco Abuse Patient reports a long history of cigarette smoking      Ready to quit? 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 UDS + for opiates but she is prescribed Norco at home   Other Stroke Risk Factors Obesity, Body mass index is 35.7 kg/m., BMI >/= 30 associated with increased stroke risk, recommend weight loss, diet and exercise as appropriate  Obstructive sleep apnea, on CPAP at home Migraines Takes sumatriptan at home  Other Active Problems Bipolar disorder On abilify at home MDD On Celexa at home Urinary retention On home [facility] day # 1  Lanae Boast, AGACNP-BC [facility] Pager: [phone]  STROKEMD NOTE :  I have personally obtained history,examined this patient, reviewed notes, independently viewed imaging studies, participated in medical decision making and plan of care.ROS completed by me personally and pertinent positives fully documented  I have made any additions or clarifications directly to the above note. Agree with note above.  Patient presented with episode and tunnel vision along with diplopia in the setting of mild headache and had somewhat similar episode about a year ago with negative neurovascular workup likely presenting with acute migraine.  MRI interestingly shows tiny punctate right hippocampal diffusion hyperintensity which likely represents a silent infarct unrelated to her clinical presentation.  Recommend further evaluation by checking TCD bubble study for PFO and outpatient TEE and 30-day heart monitoring.  Check ANA panel and anticardiolipin antibodies.  Recommend aspirin for stroke prevention and aggressive risk factor patient.  Long  discussion with the patient realistic questions.  Discussed with Dr. Blake Divine.  Greater than 50% time during this 50-minute visit was spent in counseling and coordination of care and discussion with patient and care team questions.  Follow-up as an outpatient stroke clinic in 2 months  Delia Heady, MD Medical Director [facility] Pager: [phone] 12/22/2023 2:36 PM  To contact Stroke Continuity provider, please refer to [email or web address]. After hours, contact General Neurology

## 2023-12-23 LAB — LUPUS ANTICOAGULANT PANEL
DRVVT: 40.6 s (ref 0.0–47.0)
PTT Lupus Anticoagulant: 31.2 s (ref 0.0–43.5)

## 2023-12-23 LAB — BETA-2-GLYCOPROTEIN I ABS, IGG/M/A
Beta-2 Glyco I IgG: <9 GPI IgG units (ref 0–20)
Beta-2-Glycoprotein I IgA: <9 GPI IgA units (ref 0–25)
Beta-2-Glycoprotein I IgM: <9 GPI IgM units (ref 0–32)

## 2023-12-23 LAB — PROTEIN S ACTIVITY: Protein S Activity: 88 % (ref 63–140)

## 2023-12-23 LAB — PROTEIN C ACTIVITY: Protein C Activity: 131 % (ref 73–180)

## 2023-12-23 NOTE — Discharge Summary (Signed)
 Physician Discharge Summary   Patient: Katie Spencer MRN: [id number] DOB: July 03, 1981  Admit date:     12/20/2023  Discharge date: 12/22/2023  Discharge Physician: Kathlen Mody   PCP: Loyal Jacobson, MD   Recommendations at discharge:  Please follow up with neurology and cardiology as scheduled.  Please check cbc and bmp in one week.  Recommend outpatient follow up with PCP and check ANA panel and anti cardiolipin antibodies. ( Patient wanted to go home)  Discharge Diagnoses: Principal Problem:   Acute CVA (cerebrovascular accident) [facility]) Active Problems:   Hypokalemia   Pre-diabetes   Bipolar 2 disorder (HCC)   Anxiety and depression   Lumbar radiculopathy   Tobacco abuse   CVA (cerebral vascular accident) (HCC)  Resolved Problems:   Hormone replacement therapy (HRT)   MDD (major depressive disorder)  Hospital Course:  Katie Spencer is a 43 y.o. female with medical history significant of bipolar 2, anxiety, remote cocaine abuse, tobacco abuse, s/p surgical menopause, back pain who presented to ED with complaints of vision changes and dizziness.   Assessment and Plan: * Acute CVA (cerebrovascular accident) [facility]) 43 year old presenting to ED after acute onset of vision disturbance on 12/19/23 around 4:15 with blurry vision, tunnel vision. It slightly improved then happened again around 6:00pm. This was accompanied by dizziness and nausea and prompted her to go to ED. MRI shows a 3 mm focus of restricted diffusion within the medial right temporal lobe/hippocampus likely reflecting an acute infarct.  -place in observation on telemetry for stroke work-up -Neurochecks per protocol -Neurology consulted with new findings on MRI  -CTA head and neck with no LVO -echo with bubble study reviewed.  Neurology recommended outpatient TEE, and 30 day monitor.  Cardiology notified.    Hypokalemia Replaced.   Pre-diabetes Diet and lifestyle changes.  A1c of 5.4 in 05/2023   Bipolar  2 disorder (HCC) Resume home meds.   Anxiety and depression Continue celexa   Lumbar radiculopathy Resume home  meds.   Tobacco abuse Nicotine patch Encouraged cessation       Consultants: neurology Cardiology.  Procedures performed: MRI, echo.   Disposition: Home Diet recommendation:  Discharge Diet Orders (From admission, onward)     Start     Ordered   12/22/23 0000  Diet - low sodium heart healthy        12/22/23 1540           Regular diet DISCHARGE MEDICATION: Allergies as of 12/22/2023       Reactions   Almond (diagnostic) Itching, Rash   Diphenhydramine Other (See Comments)   Drowsy   Morphine And Codeine Hives, Itching, Swelling, Other (See Comments)   Hives/swelling/itching of arms    Nsaids Other (See Comments)   GI issues        Medication List     STOP taking these medications    hydrocortisone 25 MG suppository Commonly known as: ANUSOL-HC   pantoprazole 40 MG tablet Commonly known as: Protonix   potassium chloride SA 20 MEQ tablet Commonly known as: KLOR-CON M       TAKE these medications    Abilify Maintena 400 MG Prsy prefilled syringe Generic drug: ARIPiprazole ER Inject 400 mg into the muscle every 21 ( twenty-one) days.   albuterol (2.5 MG/3ML) 0.083% nebulizer solution Commonly known as: PROVENTIL Take 2.5 mg by nebulization as needed for wheezing or shortness of breath.   aspirin EC 81 MG tablet Take 1 tablet (81 mg total) by mouth daily.  Swallow whole.   atorvastatin 40 MG tablet Commonly known as: Lipitor Take 1 tablet (40 mg total) by mouth daily.   citalopram 40 MG tablet Commonly known as: CELEXA Take 40 mg by mouth daily.   cyclobenzaprine 10 MG tablet Commonly known as: FLEXERIL Take 10 mg by mouth 3 (three) times daily.   dicyclomine 20 MG tablet Commonly known as: BENTYL Take 1 tablet (20 mg total) by mouth 2 (two) times daily.   famotidine 40 MG tablet Commonly known as: PEPCID Take 40 mg  by mouth as needed for heartburn or indigestion.   fluticasone 50 MCG/ACT nasal spray Commonly known as: FLONASE Place 2 sprays into both nostrils as needed for allergies.   gabapentin 600 MG tablet Commonly known as: NEURONTIN Take 600 mg by mouth 3 (three) times daily.   HYDROcodone-acetaminophen 7.5-325 MG tablet Commonly known as: NORCO Take 1 tablet by mouth 4 (four) times daily as needed for moderate pain (pain score 4-6).   ibuprofen 600 MG tablet Commonly known as: ADVIL Take 600 mg by mouth 3 (three) times daily as needed for mild pain (pain score 1-3).   nicotine 21 mg/24hr patch Commonly known as: NICODERM CQ - dosed in mg/24 hours Place 1 patch (21 mg total) onto the skin daily.   omeprazole 40 MG capsule Commonly known as: PRILOSEC Take 1 capsule (40 mg total) by mouth daily as needed (indigestion).   SUMAtriptan 100 MG tablet Commonly known as: IMITREX Take 100 mg by mouth every 2 (two) hours as needed for migraine.   tamsulosin 0.4 MG Caps capsule Commonly known as: FLOMAX Take 1 capsule by mouth as needed (bladder).        Follow-up Information     [facility] Follow up.   Specialty: Cardiology Why: [facility] - Dr. Blake Divine requested the cardiology office will mail you a heart monitor to wear for 30 days. It will come with instructions for how to apply and mail back. They will also call you to arrange a new patient appointment to discuss additional stroke workup. Contact information: [address], [address] [place] [address] [phone]               Discharge Exam: Ceasar Mons Weights   12/20/23 1939  Weight: 94.3 kg   General exam: Appears calm and comfortable  Respiratory system: Clear to auscultation. Respiratory effort normal. Cardiovascular system: S1 & S2 heard, RRR. No JVD,  Gastrointestinal system: Abdomen is nondistended, soft and nontender.  Central nervous system: Alert and  oriented. No focal neurological deficits. Extremities: Symmetric 5 x 5 power. Skin: No rashes,  Psychiatry:  Mood & affect appropriate.    Condition at discharge: fair  The results of significant diagnostics from this hospitalization (including imaging, microbiology, ancillary and laboratory) are listed below for reference.   Imaging Studies: VAS [place] TRANSCRANIAL DOPPLER W BUBBLES Result Date: 12/23/2023  Transcranial Doppler with Bubble Patient Name:  Katie Spencer  Date of Exam:   12/22/2023 Medical Rec #: [id number]          Accession #:    [id number] Date of Birth: 04-07-1981           Patient Gender: F Patient Age:   73 years Exam Location:  [facility] Procedure:      VAS [place] TRANSCRANIAL DOPPLER W BUBBLES Referring Phys: Lanae Boast --------------------------------------------------------------------------------  Indications: Stroke. Comparison Study: No prior study Performing Technologist: Sherren Kerns RVS  Examination Guidelines: A complete  evaluation includes B-mode imaging, spectral Doppler, color Doppler, and power Doppler as needed of all accessible portions of each vessel. Bilateral testing is considered an integral part of a complete examination. Limited examinations for reoccurring indications may be performed as noted.  Summary: No HITS at rest or during Valsalva. Negative transcranial Doppler Bubble study with no evidence of right to left intracardiac communication.  A vascular evaluation was performed. The right middle cerebral artery was studied. An IV was inserted into the patient's right AC. Verbal informed consent was obtained.  Negative TCD Bubble study *See table(s) above for TCD measurements and observations.  Diagnosing physician: Delia Heady MD Electronically signed by Delia Heady MD on 12/23/2023 at 11:31:01 AM.    Final    ECHOCARDIOGRAM COMPLETE BUBBLE STUDY Result Date: 12/22/2023    ECHOCARDIOGRAM REPORT   Patient Name:   Katie Spencer Date of Exam:  12/22/2023 Medical Rec #:  [id number]         Height:       64.0 in Accession #:    [id number]        Weight:       208.0 lb Date of Birth:  12/12/1980          BSA:          1.989 m Patient Age:    42 years          BP:           124/73 mmHg Patient Gender: F                 HR:           74 bpm. Exam Location:  Inpatient Procedure: 2D Echo and Saline Contrast Bubble Study (Both Spectral and Color            Flow Doppler were utilized during procedure). Indications:    stroke  History:        Patient has no prior history of Echocardiogram examinations.                 Risk Factors:Current Smoker.  Sonographer:    Delcie Roch RDCS Referring Phys: [id number] ALLISON WOLFE IMPRESSIONS  1. Left ventricular ejection fraction, by estimation, is 70 to 75%. Left ventricular ejection fraction by PLAX is 72 %. The left ventricle has hyperdynamic function. The left ventricle has no regional wall motion abnormalities. Left ventricular diastolic parameters were normal.  2. Right ventricular systolic function is normal. The right ventricular size is normal. Tricuspid regurgitation signal is inadequate for assessing PA pressure.  3. The mitral valve is grossly normal. Trivial mitral valve regurgitation.  4. The aortic valve is tricuspid. Aortic valve regurgitation is not visualized.  5. Agitated saline contrast bubble study was negative, with no evidence of any interatrial shunt. Comparison(s): No prior Echocardiogram. Conclusion(s)/Recommendation(s): Normal biventricular function without evidence of hemodynamically significant valvular heart disease. FINDINGS  Left Ventricle: Left ventricular ejection fraction, by estimation, is 70 to 75%. Left ventricular ejection fraction by PLAX is 72 %. The left ventricle has hyperdynamic function. The left ventricle has no regional wall motion abnormalities. The left ventricular internal cavity size was normal in size. There is no left ventricular hypertrophy. Left ventricular diastolic  parameters were normal. Right Ventricle: The right ventricular size is normal. No increase in right ventricular wall thickness. Right ventricular systolic function is normal. Tricuspid regurgitation signal is inadequate for assessing PA pressure. Left Atrium: Left atrial size was normal in size. Right Atrium: Right atrial size was  normal in size. Pericardium: There is no evidence of pericardial effusion. Mitral Valve: The mitral valve is grossly normal. Trivial mitral valve regurgitation. Tricuspid Valve: The tricuspid valve is normal in structure. Tricuspid valve regurgitation is not demonstrated. Aortic Valve: The aortic valve is tricuspid. Aortic valve regurgitation is not visualized. Pulmonic Valve: The pulmonic valve was normal in structure. Pulmonic valve regurgitation is not visualized. Aorta: The aortic root and ascending aorta are structurally normal, with no evidence of dilitation. IAS/Shunts: No atrial level shunt detected by color flow Doppler. Agitated saline contrast was given intravenously to evaluate for intracardiac shunting. Agitated saline contrast bubble study was negative, with no evidence of any interatrial shunt.  LEFT VENTRICLE PLAX 2D LV EF:         Left            Diastology                ventricular     LV e' medial:    9.46 cm/s                ejection        LV E/e' medial:  7.9                fraction by     LV e' lateral:   10.20 cm/s                PLAX is 72      LV E/e' lateral: 7.3                %. LVIDd:         5.10 cm LVIDs:         3.00 cm LV PW:         0.90 cm LV IVS:        1.00 cm LVOT diam:     2.10 cm LV SV:         69 LV SV Index:   35 LVOT Area:     3.46 cm  RIGHT VENTRICLE             IVC RV Basal diam:  2.70 cm     IVC diam: 1.30 cm RV S prime:     13.80 cm/s TAPSE (M-mode): 2.0 cm LEFT ATRIUM             Index        RIGHT ATRIUM           Index LA diam:        3.50 cm 1.76 cm/m   RA Area:     14.60 cm LA Vol (A2C):   52.1 ml 26.19 ml/m  RA Volume:   35.40 ml   17.80 ml/m LA Vol (A4C):   41.7 ml 20.96 ml/m LA Biplane Vol: 48.4 ml 24.33 ml/m  AORTIC VALVE LVOT Vmax:   107.00 cm/s LVOT Vmean:  74.900 cm/s LVOT VTI:    0.199 m  AORTA Ao Root diam: 3.40 cm Ao Asc diam:  3.30 cm MITRAL VALVE MV Area (PHT): 2.87 cm    SHUNTS MV Decel Time: 264 msec    Systemic VTI:  0.20 m MV E velocity: 74.40 cm/s  Systemic Diam: 2.10 cm MV A velocity: 50.80 cm/s MV E/A ratio:  1.46 Zoila Shutter MD Electronically signed by Zoila Shutter MD Signature Date/Time: 12/22/2023/11:02:27 AM    Final    MR BRAIN WO CONTRAST Result Date: 12/21/2023 CLINICAL DATA:  Provided history: Headache, neuro deficit. EXAM:  MRI HEAD WITHOUT CONTRAST TECHNIQUE: Multiplanar, multiecho pulse sequences of the brain and surrounding structures were obtained without intravenous contrast. COMPARISON:  Noncontrast head CT and CT angiogram head/neck 12/20/2023. Brain MRI 06/11/2023. FINDINGS: Mild intermittent motion degradation. Brain: Cerebral volume is normal. 3 mm focus of restricted diffusion within the medial right temporal lobe/hippocampus (series 2, image 22). There are a few small nonspecific foci of T2 FLAIR hyperintense signal abnormality scattered within the cerebral white matter, similar to the prior brain MRI of 06/11/2023. Partial empty sella turcica. No cortical encephalomalacia is identified. No evidence of an intracranial mass. No chronic intracranial blood products. No extra-axial fluid collection. No midline shift. Vascular: Maintained flow voids within the proximal large arterial vessels. Skull and upper cervical spine: No focal worrisome marrow lesion. Sinuses/Orbits: No mass or acute finding within the imaged orbits. No significant paranasal sinus disease. IMPRESSION: 1. Mildly motion degraded exam. 2. 3 mm focus of restricted diffusion within the medial right temporal lobe/hippocampus, likely reflecting an acute infarct. Of note, this finding can also be seen the setting of transient global  amnesia. 3. There are a few small nonspecific chronic insults within the cerebral white matter, similar to the prior brain MRI of 06/11/2023. 4. Partially empty sella turcica. This finding can reflect incidental anatomic variation, or alternatively, it can be associated with chronic idiopathic intracranial hypertension (pseudotumor cerebri). Electronically Signed   By: Jackey Loge D.O.   On: 12/21/2023 13:46   CT ANGIO HEAD NECK W WO CM Result Date: 12/20/2023 CLINICAL DATA:  Dizziness, nausea and vision changes EXAM: CT ANGIOGRAPHY HEAD AND NECK WITH AND WITHOUT CONTRAST TECHNIQUE: Multidetector CT imaging of the head and neck was performed using the standard protocol during bolus administration of intravenous contrast. Multiplanar CT image reconstructions and MIPs were obtained to evaluate the vascular anatomy. Carotid stenosis measurements (when applicable) are obtained utilizing NASCET criteria, using the distal internal carotid diameter as the denominator. RADIATION DOSE REDUCTION: This exam was performed according to the departmental dose-optimization program which includes automated exposure control, adjustment of the mA and/or kV according to patient size and/or use of iterative reconstruction technique. CONTRAST:  75mL OMNIPAQUE IOHEXOL 350 MG/ML SOLN COMPARISON:  None Available. FINDINGS: CTA NECK FINDINGS Skeleton: No acute abnormality or high grade bony spinal canal stenosis. Other neck: Normal pharynx, larynx and major salivary glands. No cervical lymphadenopathy. Unremarkable thyroid gland. Upper chest: No pneumothorax or pleural effusion. No nodules or masses. Aortic arch: There is no calcific atherosclerosis of the aortic arch. Conventional 3 vessel aortic branching pattern. RIGHT carotid system: Normal without aneurysm, dissection or stenosis. LEFT carotid system: Normal without aneurysm, dissection or stenosis. Vertebral arteries: Left dominant configuration. There is no dissection, occlusion or  flow-limiting stenosis to the skull base (V1-V3 segments). CTA HEAD FINDINGS POSTERIOR CIRCULATION: Vertebral arteries are normal. No proximal occlusion of the anterior or inferior cerebellar arteries. Basilar artery is normal. Superior cerebellar arteries are normal. Posterior cerebral arteries are normal. ANTERIOR CIRCULATION: Intracranial internal carotid arteries are normal. Anterior cerebral arteries are normal. Middle cerebral arteries are normal. Venous sinuses: As permitted by contrast timing, patent. Anatomic variants: None Review of the MIP images confirms the above findings. IMPRESSION: 1. No emergent large vessel occlusion or high-grade stenosis of the intracranial arteries. 2. Normal CTA of the neck. Electronically Signed   By: Deatra Robinson M.D.   On: 12/20/2023 22:31   CT HEAD CODE STROKE WO CONTRAST Result Date: 12/20/2023 CLINICAL DATA:  Code stroke. Acute neurologic deficit. Vision changes and  dizziness. EXAM: CT HEAD WITHOUT CONTRAST TECHNIQUE: Contiguous axial images were obtained from the base of the skull through the vertex without intravenous contrast. RADIATION DOSE REDUCTION: This exam was performed according to the departmental dose-optimization program which includes automated exposure control, adjustment of the mA and/or kV according to patient size and/or use of iterative reconstruction technique. COMPARISON:  04/21/2023 FINDINGS: Brain: There is no mass, hemorrhage or extra-axial collection. The size and configuration of the ventricles and extra-axial CSF spaces are normal. The brain parenchyma is normal, without evidence of acute or chronic infarction. Vascular: No abnormal hyperdensity of the major intracranial arteries or dural venous sinuses. No intracranial atherosclerosis. Skull: The visualized skull base, calvarium and extracranial soft tissues are normal. Sinuses/Orbits: No fluid levels or advanced mucosal thickening of the visualized paranasal sinuses. No mastoid or middle  ear effusion. The orbits are normal. ASPECTS [facility] Stroke Program Early CT Score) - Ganglionic level infarction (caudate, lentiform nuclei, internal capsule, insula, M1-M3 cortex): 7 - Supraganglionic infarction (M4-M6 cortex): 3 Total score (0-10 with 10 being normal): 10 IMPRESSION: 1. No acute intracranial abnormality. 2. ASPECTS is 10. These results were called by telephone at the time of interpretation on 12/20/2023 at 8:03 pm to provider [facility] , who verbally acknowledged these results. Electronically Signed   By: Deatra Robinson M.D.   On: 12/20/2023 20:04    Microbiology: Results for orders placed or performed during the hospital encounter of 11/01/23  Resp panel by RT-PCR (RSV, Flu A&B, Covid) Anterior Nasal Swab     Status: None   Collection Time: 11/01/23  3:06 PM   Specimen: Anterior Nasal Swab  Result Value Ref Range Status   SARS Coronavirus 2 by RT PCR NEGATIVE NEGATIVE Final    Comment: (NOTE) SARS-CoV-2 target nucleic acids are NOT DETECTED.  The SARS-CoV-2 RNA is generally detectable in upper respiratory specimens during the acute phase of infection. The lowest concentration of SARS-CoV-2 viral copies this assay can detect is 138 copies/mL. A negative result does not preclude SARS-Cov-2 infection and should not be used as the sole basis for treatment or other patient management decisions. A negative result may occur with  improper specimen collection/handling, submission of specimen other than nasopharyngeal swab, presence of viral mutation(s) within the areas targeted by this assay, and inadequate number of viral copies(<138 copies/mL). A negative result must be combined with clinical observations, patient history, and epidemiological information. The expected result is Negative.  Fact Sheet for Patients:  BloggerCourse.com  Fact Sheet for Healthcare Providers:  SeriousBroker.it  This test is no t yet  approved or cleared by the [place] FDA and  has been authorized for detection and/or diagnosis of SARS-CoV-2 by FDA under an Emergency Use Authorization (EUA). This EUA will remain  in effect (meaning this test can be used) for the duration of the COVID-19 declaration under Section 564(b)(1) of the Act, 21 U.S.C.section 360bbb-3(b)(1), unless the authorization is terminated  or revoked sooner.       Influenza A by PCR NEGATIVE NEGATIVE Final   Influenza B by PCR NEGATIVE NEGATIVE Final    Comment: (NOTE) The Xpert Xpress SARS-CoV-2/FLU/RSV plus assay is intended as an aid in the diagnosis of influenza from Nasopharyngeal swab specimens and should not be used as a sole basis for treatment. Nasal washings and aspirates are unacceptable for Xpert Xpress SARS-CoV-2/FLU/RSV testing.  Fact Sheet for Patients: BloggerCourse.com  Fact Sheet for Healthcare Providers: SeriousBroker.it  This test is not yet approved or cleared by the [place]  [place] FDA and has been authorized for detection and/or diagnosis of SARS-CoV-2 by FDA under an Emergency Use Authorization (EUA). This EUA will remain in effect (meaning this test can be used) for the duration of the COVID-19 declaration under Section 564(b)(1) of the Act, 21 U.S.C. section 360bbb-3(b)(1), unless the authorization is terminated or revoked.     Resp Syncytial Virus by PCR NEGATIVE NEGATIVE Final    Comment: (NOTE) Fact Sheet for Patients: BloggerCourse.com  Fact Sheet for Healthcare Providers: SeriousBroker.it  This test is not yet approved or cleared by the [place] FDA and has been authorized for detection and/or diagnosis of SARS-CoV-2 by FDA under an Emergency Use Authorization (EUA). This EUA will remain in effect (meaning this test can be used) for the duration of the COVID-19 declaration under Section 564(b)(1) of  the Act, 21 U.S.C. section 360bbb-3(b)(1), unless the authorization is terminated or revoked.  Performed at [facility], [address] [place], [place] [address]     Labs: CBC: Recent Labs  Lab 12/20/23 1947 12/21/23 1517  WBC 11.2* 7.7  NEUTROABS 6.5 4.4  HGB 14.5 13.7  HCT 40.0 38.5  MCV 88.3 88.7  PLT 276 255   Basic Metabolic Panel: Recent Labs  Lab 12/20/23 1947 12/21/23 1517 12/22/23 0654  NA 138 139 139  K 3.3* 3.2* 4.0  CL 103 105 108  CO2 24 26 24   GLUCOSE 118* 142* 113*  BUN 15 9 13   CREATININE 1.03* 0.85 0.79  CALCIUM 9.5 9.4 9.3  MG  --  2.1  --    Liver Function Tests: Recent Labs  Lab 12/20/23 1947  AST 28  ALT 36  ALKPHOS 99  BILITOT 0.6  PROT 8.4*  ALBUMIN 4.7   CBG: Recent Labs  Lab 12/20/23 1951  GLUCAP 132*    Discharge time spent: 42 minutes.   Signed: Kathlen Mody, MD [facility] 12/23/2023

## 2023-12-24 LAB — LIPOPROTEIN A (LPA): Lipoprotein (a): 56.1 nmol/L — ABNORMAL HIGH (ref <75.0)

## 2023-12-24 LAB — HOMOCYSTEINE: Homocysteine: 8 umol/L (ref 0.0–14.5)

## 2023-12-25 ENCOUNTER — Encounter: Payer: Self-pay | Admitting: Occupational Therapy

## 2023-12-25 ENCOUNTER — Ambulatory Visit: Payer: MEDICAID | Admitting: Cardiology

## 2023-12-25 ENCOUNTER — Encounter: Payer: Self-pay | Admitting: Cardiology

## 2023-12-25 NOTE — Telephone Encounter (Signed)
 PT/ husband advised Katie Spencer's advice:   Hi Katie Spencer, thank you. Can you let her know that unfortunately we otherwise have not been involved in her care yet aside from ordering the monitor and follow-up, so we cannot provide medical recommendations? She should direct questions to the team she is established with. I had sent a new patient appt request to scheduling but do not see that it has been scheduled yet if we can offer that. Thank you!   They will call us re: a new pt appt once she is D/C from the hospital unless the plan has changed by the current team managing her care at this time.

## 2023-12-25 NOTE — Telephone Encounter (Addendum)
 I called the pt and her husband.Marland Kitchen according to them the pt had worsening neurological symptoms this past Sunday... amnesia, unable to walk, "dilated pupils" per her husband... she is responsive but now re-admitted to [facility]... they say she had another MRI of the brain and and it is resulting differently than the results here at [facility] recently.. they asked that I reach out to the reading MD.. I advised them it is more reasonable to talk with the Neurologist/ Care team seeing her currently at [facility] and let him know they have concerns.   I will also reach out to our APP that was aiding in her post hosp cardiac plans to let her know that the pt is back in the hospital.

## 2023-12-26 LAB — FACTOR 5 LEIDEN

## 2023-12-31 ENCOUNTER — Other Ambulatory Visit: Payer: Self-pay

## 2023-12-31 ENCOUNTER — Encounter (HOSPITAL_BASED_OUTPATIENT_CLINIC_OR_DEPARTMENT_OTHER): Payer: Self-pay

## 2023-12-31 ENCOUNTER — Emergency Department (HOSPITAL_BASED_OUTPATIENT_CLINIC_OR_DEPARTMENT_OTHER)
Admission: EM | Admit: 2023-12-31 | Discharge: 2024-01-01 | Disposition: A | Discharge status: Home / Self Care | Payer: MEDICAID | Attending: Emergency Medicine | Admitting: Emergency Medicine

## 2023-12-31 DIAGNOSIS — H538 Other visual disturbances: Secondary | ICD-10-CM | POA: Insufficient documentation

## 2023-12-31 DIAGNOSIS — Z7982 Long term (current) use of aspirin: Secondary | ICD-10-CM | POA: Insufficient documentation

## 2023-12-31 DIAGNOSIS — R519 Headache, unspecified: Secondary | ICD-10-CM | POA: Diagnosis present

## 2023-12-31 HISTORY — DX: Benign intracranial hypertension: G93.2

## 2023-12-31 MED ORDER — MAGNESIUM SULFATE 2 GM/50ML IV SOLN
2.0000 g | Freq: Once | INTRAVENOUS | Status: AC
  Administered 2023-12-31: 2 g via INTRAVENOUS
  Filled 2023-12-31: qty 50

## 2023-12-31 MED ORDER — PROCHLORPERAZINE EDISYLATE 10 MG/2ML IJ SOLN
10.0000 mg | Freq: Once | INTRAMUSCULAR | Status: AC
  Administered 2023-12-31: 10 mg via INTRAVENOUS
  Filled 2023-12-31: qty 2

## 2023-12-31 MED ORDER — DIPHENHYDRAMINE HCL 50 MG/ML IJ SOLN
25.0000 mg | Freq: Once | INTRAMUSCULAR | Status: AC
  Administered 2023-12-31: 25 mg via INTRAVENOUS
  Filled 2023-12-31: qty 1

## 2023-12-31 MED ORDER — ACETAMINOPHEN 500 MG PO TABS
1000.0000 mg | ORAL_TABLET | Freq: Once | ORAL | Status: AC
  Administered 2023-12-31: 1000 mg via ORAL
  Filled 2023-12-31: qty 2

## 2023-12-31 MED ORDER — KETOROLAC TROMETHAMINE 15 MG/ML IJ SOLN
15.0000 mg | Freq: Once | INTRAMUSCULAR | Status: AC
  Administered 2023-12-31: 15 mg via INTRAVENOUS
  Filled 2023-12-31: qty 1

## 2023-12-31 NOTE — ED Notes (Signed)
Pt sleeping. Resps even and unlabored.

## 2023-12-31 NOTE — ED Provider Notes (Signed)
  Provider Note MRN:  [id number]  Arrival date & time: 01/01/24    ED Course and Medical Decision Making  Assumed care of patient at sign-out or upon transfer.  History of IIH here with increased headache however feeling better after cocktail, bit drowsy after Benadryl will reassess, has close follow-up.  Procedures  Final Clinical Impressions(s) / ED Diagnoses     ICD-10-CM   1. Nonintractable headache, unspecified chronicity pattern, unspecified headache type  R51.9       ED Discharge Orders     None         Discharge Instructions      You were evaluated in the Emergency Department and after careful evaluation, we did not find any emergent condition requiring admission or further testing in the hospital.  Your exam/testing today is overall reassuring.  Recommend follow-up with neurology.  Please return to the Emergency Department if you experience any worsening of your condition.   Thank you for allowing us  to be a part of your care.      Merrick Abe. Harless Lien, MD [facility] Emergency Medicine [facility] [email or web address]    Edson Graces, MD 01/01/24 [phone]

## 2023-12-31 NOTE — ED Provider Notes (Signed)
 [place] [facility] Provider Note   CSN: [id number] Arrival date & time: 12/31/23  1937     History  Chief Complaint  Patient presents with   Blurred Vision    Katie Spencer is a 43 y.o. female with history significant of bipolar 2, anxiety, remote cocaine abuse, tobacco abuse, s/p surgical menopause, back pain  who presents with blurry vision/tunnel vision and 8/10 headache that feels like pressure.  Today c/o blurred/tunnel vision. No other neuro deficits. Similar symptoms as recently.   Per chart review patient was admitted from 12/20/2023 to 12/22/2023 after presenting for visual disturbance on 4-25, blurry vision/tunnel vision.  Accompanied by dizziness and nausea.  Brain MRI showed 3 mm focus in the medial right temporal lobe/hippocampus likely representing an acute infarct but neurology thought likely incidental and felt patient's symptoms likely representing complex migraine or possible chronic idiopathic intracranial hypertension as she had partially empty sella turcica on imaging.  CTA head and neck was unremarkable.  Initiated aspirin  81 mg daily.   Patient states subsequently she was admitted for the same symptoms at [facility] from 12/23/23-12/26/23 , where she had an LP and was officially diagnosed with IIH. Initially declined acetazolamide d/t h/o renal stones but subsequently accepted. She was started on acetazolamide and her symptoms improved until today. She took her acetazolamide as prescribed today. Has appt with NSGY scheduled for next week to discuss venous sinus stenting, was also referred to ophthalmology.   Past Medical History:  Diagnosis Date   Abnormal vaginal bleeding    Chronic abdominal pain    Chronic back pain    IIH (idiopathic intracranial hypertension)    Renal disorder        Home Medications Prior to Admission medications   Medication Sig Start Date End Date Taking? Authorizing Provider  ABILIFY MAINTENA 400 MG  PRSY prefilled syringe Inject 400 mg into the muscle every 21 ( twenty-one) days. 09/27/23   [provider]  albuterol  (PROVENTIL ) (2.5 MG/3ML) 0.083% nebulizer solution Take 2.5 mg by nebulization as needed for wheezing or shortness of breath. 12/30/22   [provider]  aspirin  EC 81 MG tablet Take 1 tablet (81 mg total) by mouth daily. Swallow whole. 12/23/23   Feliciana Horn, MD  atorvastatin  (LIPITOR) 40 MG tablet Take 1 tablet (40 mg total) by mouth daily. 12/22/23 12/21/24  Akula, Vijaya, MD  citalopram  (CELEXA ) 40 MG tablet Take 40 mg by mouth daily. 01/01/20   [provider]  cyclobenzaprine  (FLEXERIL ) 10 MG tablet Take 10 mg by mouth 3 (three) times daily. 10/19/23   [provider]  dicyclomine  (BENTYL ) 20 MG tablet Take 1 tablet (20 mg total) by mouth 2 (two) times daily. Patient taking differently: Take 20 mg by mouth as needed for spasms. 03/10/23   Ginnie Laine, MD  famotidine  (PEPCID ) 40 MG tablet Take 40 mg by mouth as needed for heartburn or indigestion. 11/17/21   [provider]  fluticasone (FLONASE) 50 MCG/ACT nasal spray Place 2 sprays into both nostrils as needed for allergies. 12/02/20   [provider]  gabapentin  (NEURONTIN ) 600 MG tablet Take 600 mg by mouth 3 (three) times daily. 05/19/22   [provider]  HYDROcodone -acetaminophen  (NORCO) 7.5-325 MG tablet Take 1 tablet by mouth 4 (four) times daily as needed for moderate pain (pain score 4-6). 11/22/23   [provider]  ibuprofen  (ADVIL ) 600 MG tablet Take 600 mg by mouth 3 (three) times daily as needed for  mild pain (pain score 1-3). 09/21/23   [provider]  nicotine  (NICODERM CQ  - DOSED IN MG/24 HOURS) 21 mg/24hr patch Place 1 patch (21 mg total) onto the skin daily. 12/23/23   Feliciana Horn, MD  omeprazole  (PRILOSEC) 40 MG capsule Take 1 capsule (40 mg total) by mouth daily as needed (indigestion). 03/25/23   Darlis Eisenmenger, PA-C  SUMAtriptan  (IMITREX) 100 MG tablet Take 100 mg by mouth every 2 (two) hours as needed for migraine. 02/09/22   [provider]  tamsulosin  (FLOMAX ) 0.4 MG CAPS capsule Take 1 capsule by mouth as needed (bladder). 05/26/21   [provider]  belladonna-opium  (B&O SUPPRETTES) 16.2-30 MG suppository Place 1 suppository rectally every 8 (eight) hours as needed for pain. Patient not taking: Reported on 05/27/2020 05/20/20 08/18/20  Ballard Bongo, MD  lansoprazole  (PREVACID ) 30 MG capsule Take 1 capsule daily while taking ibuprofen . Patient not taking: Reported on 05/27/2020 04/16/20 08/18/20  Molpus, Autry Legions, MD      Allergies    Almond (diagnostic), Diphenhydramine , Morphine  and codeine, and Nsaids    Review of Systems   Review of Systems A 10 point review of systems was performed and is negative unless otherwise reported in HPI.  Physical Exam Updated Vital Signs BP (!) 140/83 (BP Location: Right Arm)   Pulse 99   Temp 98.2 F (36.8 C) (Oral)   Resp 18   Ht 5\' 4"  (1.626 m)   Wt 94 kg   LMP 04/04/2020 Comment: neg preg test  SpO2 100%   BMI 35.57 kg/m  Physical Exam General: Normal appearing female, lying in bed.  HEENT: PERRLA, EOMI, no nystagmus,  Sclera anicteric, MMM, trachea midline.  No facial droop, normal speech.  Tongue protrudes midline. Cardiology: RRR, no murmurs/rubs/gallops. Resp: Normal respiratory rate and effort. CTAB, no wheezes, rhonchi, crackles.  Abd: Soft, non-tender, non-distended. No rebound tenderness or guarding.  GU: Deferred. MSK: No peripheral edema or signs of trauma. Extremities without deformity or TTP. No cyanosis or clubbing. Skin: warm, dry. Neuro: A&Ox4, CNs II-XII grossly intact.  5 out of 5 strength in all extremities.. Sensation grossly intact.  Psych: Normal mood and affect.   ED Results / Procedures / Treatments   Labs (all labs ordered are listed, but only abnormal results are displayed) Labs Reviewed - No data to  display  EKG None  Radiology No results found.  Procedures Procedures    Medications Ordered in ED Medications  acetaminophen  (TYLENOL ) tablet 1,000 mg (has no administration in time range)  prochlorperazine  (COMPAZINE ) injection 10 mg (has no administration in time range)  diphenhydrAMINE  (BENADRYL ) injection 25 mg (has no administration in time range)  magnesium  sulfate IVPB 2 g 50 mL (has no administration in time range)    ED Course/ Medical Decision Making/ A&P                          Medical Decision Making Risk OTC drugs. Prescription drug management.    This patient presents to the ED for concern of headache, tunnel vision; this involves an extensive number of treatment options, and is a complaint that carries with it a high risk of complications and morbidity.  I considered the following differential and admission for this acute, potentially life threatening condition.   MDM:    Patient with sxs c/w her known IIH.  No symptoms consistent with acute stroke, acute angle-closure glaucoma, syncope, ICH.  She is taking acetazolamide 250 mg  BID. Can consider increasing this. Shared decision making with patient, will do migraine cocktail first, if no improvement can consider therapeutic LP.  Patient has had the symptoms before and has already been definitively diagnosed with IIH, has neurology follow-up already scheduled.  On reevaluation after headache cocktail, patient is very drowsy.  She did have Benadryl  listed as an "allergy" with drowsiness.  Patient will need further reevaluation but her headache and visual changes have significantly improved after the headache cocktail.     Additional history obtained from chart review.  External records from outside source obtained and reviewed including [facility] admission documentation  Reevaluation: After the interventions noted above, I reevaluated the patient and found that they have :improved  Social Determinants of  Health: Lives independently  Disposition:  Patient is signed out to the oncoming ED physician Dr. Harless Lien who is made aware of her history, presentation, exam, workup, and plan.    Co morbidities that complicate the patient evaluation  Past Medical History:  Diagnosis Date   Abnormal vaginal bleeding    Chronic abdominal pain    Chronic back pain    IIH (idiopathic intracranial hypertension)    Renal disorder      Medicines Meds ordered this encounter  Medications   acetaminophen  (TYLENOL ) tablet 1,000 mg   prochlorperazine  (COMPAZINE ) injection 10 mg   diphenhydrAMINE  (BENADRYL ) injection 25 mg   magnesium  sulfate IVPB 2 g 50 mL    I have reviewed the patients home medicines and have made adjustments as needed  Problem List / ED Course: Problem List Items Addressed This Visit   None Visit Diagnoses       Nonintractable headache, unspecified chronicity pattern, unspecified headache type    -  Primary   Relevant Medications   acetaminophen  (TYLENOL ) tablet 1,000 mg (Completed)   ketorolac  (TORADOL ) 15 MG/ML injection 15 mg (Completed)                   This note was created using dictation software, which may contain spelling or grammatical errors.    Merdis Stalling, MD 01/07/24 [phone]

## 2023-12-31 NOTE — ED Triage Notes (Addendum)
 Pt was seen here on 11/20/23 States he was dx with a stroke and idiopathic, intercranial HTN.  Today c/o blurred/tunnel vision No other neuro deficits noted

## 2024-01-01 NOTE — Discharge Instructions (Signed)
 You were evaluated in the Emergency Department and after careful evaluation, we did not find any emergent condition requiring admission or further testing in the hospital.  Your exam/testing today is overall reassuring.  Recommend follow-up with neurology.  Please return to the Emergency Department if you experience any worsening of your condition.   Thank you for allowing us  to be a part of your care.

## 2024-01-07 ENCOUNTER — Ambulatory Visit: Payer: MEDICAID | Admitting: Occupational Therapy

## 2024-01-07 ENCOUNTER — Telehealth: Payer: Self-pay | Admitting: Occupational Therapy

## 2024-01-07 NOTE — Telephone Encounter (Signed)
 Spoke with pt after missed appt today.  Pt reports that she thought she had canceled the appt as she is doing well with her ADLs and her R sided weakness has resolved.  Pt did state that she is still having some vision impairments, but has an upcoming procedure to correct her vision.  OT educated pt on purpose of OT and if needs arise s/p procedure to have MD place a referral for OT evaluation.  At this point, current referral will be closed.  Anthonette Kinsman, OT

## 2024-01-21 ENCOUNTER — Ambulatory Visit: Payer: MEDICAID

## 2024-01-28 ENCOUNTER — Ambulatory Visit: Payer: MEDICAID | Attending: Internal Medicine

## 2024-01-28 DIAGNOSIS — I6389 Other cerebral infarction: Secondary | ICD-10-CM | POA: Diagnosis not present

## 2024-01-28 DIAGNOSIS — I63511 Cerebral infarction due to unspecified occlusion or stenosis of right middle cerebral artery: Secondary | ICD-10-CM | POA: Diagnosis not present

## 2024-01-28 DIAGNOSIS — I639 Cerebral infarction, unspecified: Secondary | ICD-10-CM | POA: Diagnosis not present

## 2024-01-29 ENCOUNTER — Ambulatory Visit: Payer: Self-pay | Admitting: Internal Medicine

## 2024-02-10 ENCOUNTER — Encounter (HOSPITAL_COMMUNITY): Payer: Self-pay

## 2024-02-10 ENCOUNTER — Emergency Department (HOSPITAL_COMMUNITY)
Admission: EM | Admit: 2024-02-10 | Discharge: 2024-02-11 | Disposition: A | Discharge status: Home / Self Care | Payer: MEDICAID | Attending: Emergency Medicine | Admitting: Emergency Medicine

## 2024-02-10 ENCOUNTER — Other Ambulatory Visit: Payer: Self-pay

## 2024-02-10 DIAGNOSIS — R519 Headache, unspecified: Secondary | ICD-10-CM

## 2024-02-10 DIAGNOSIS — D72829 Elevated white blood cell count, unspecified: Secondary | ICD-10-CM | POA: Diagnosis not present

## 2024-02-10 DIAGNOSIS — G932 Benign intracranial hypertension: Secondary | ICD-10-CM | POA: Diagnosis not present

## 2024-02-10 DIAGNOSIS — R7309 Other abnormal glucose: Secondary | ICD-10-CM | POA: Diagnosis not present

## 2024-02-10 DIAGNOSIS — E876 Hypokalemia: Secondary | ICD-10-CM | POA: Insufficient documentation

## 2024-02-10 DIAGNOSIS — Z7982 Long term (current) use of aspirin: Secondary | ICD-10-CM | POA: Insufficient documentation

## 2024-02-10 NOTE — ED Triage Notes (Signed)
 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 She says that often a headache cocktail will help, but she sometimes has to have LP

## 2024-02-11 ENCOUNTER — Emergency Department (HOSPITAL_COMMUNITY): Payer: MEDICAID

## 2024-02-11 DIAGNOSIS — G932 Benign intracranial hypertension: Secondary | ICD-10-CM

## 2024-02-11 LAB — CBC WITH DIFFERENTIAL/PLATELET
Abs Immature Granulocytes: 0.06 10*3/uL (ref 0.00–0.07)
Basophils Absolute: 0.1 10*3/uL (ref 0.0–0.1)
Basophils Relative: 0 %
Eosinophils Absolute: 0.2 10*3/uL (ref 0.0–0.5)
Eosinophils Relative: 1 %
HCT: 38.5 % (ref 36.0–46.0)
Hemoglobin: 13.8 g/dL (ref 12.0–15.0)
Immature Granulocytes: 0 %
Lymphocytes Relative: 25 %
Lymphs Abs: 3.6 10*3/uL (ref 0.7–4.0)
MCH: 31.3 pg (ref 26.0–34.0)
MCHC: 35.8 g/dL (ref 30.0–36.0)
MCV: 87.3 fL (ref 80.0–100.0)
Monocytes Absolute: 0.7 10*3/uL (ref 0.1–1.0)
Monocytes Relative: 5 %
Neutro Abs: 9.9 10*3/uL — ABNORMAL HIGH (ref 1.7–7.7)
Neutrophils Relative %: 69 %
Platelets: 254 10*3/uL (ref 150–400)
RBC: 4.41 MIL/uL (ref 3.87–5.11)
RDW: 12.7 % (ref 11.5–15.5)
WBC: 14.5 10*3/uL — ABNORMAL HIGH (ref 4.0–10.5)
nRBC: 0 % (ref 0.0–0.2)

## 2024-02-11 LAB — URINALYSIS, ROUTINE W REFLEX MICROSCOPIC
Bilirubin Urine: NEGATIVE
Glucose, UA: NEGATIVE mg/dL
Hgb urine dipstick: NEGATIVE
Ketones, ur: NEGATIVE mg/dL
Leukocytes,Ua: NEGATIVE
Nitrite: NEGATIVE
Protein, ur: NEGATIVE mg/dL
Specific Gravity, Urine: 1.002 — ABNORMAL LOW (ref 1.005–1.030)
pH: 7 (ref 5.0–8.0)

## 2024-02-11 LAB — MENINGITIS/ENCEPHALITIS PANEL (CSF)

## 2024-02-11 LAB — BASIC METABOLIC PANEL WITH GFR
Anion gap: 11 (ref 5–15)
BUN: 11 mg/dL (ref 6–20)
CO2: 19 mmol/L — ABNORMAL LOW (ref 22–32)
Calcium: 8.8 mg/dL — ABNORMAL LOW (ref 8.9–10.3)
Chloride: 106 mmol/L (ref 98–111)
Creatinine, Ser: 0.67 mg/dL (ref 0.44–1.00)
GFR, Estimated: >60 mL/min (ref >60)
Glucose, Bld: 235 mg/dL — ABNORMAL HIGH (ref 70–99)
Potassium: 3.9 mmol/L (ref 3.5–5.1)
Sodium: 136 mmol/L (ref 135–145)

## 2024-02-11 LAB — CSF CELL COUNT WITH DIFFERENTIAL
RBC Count, CSF: 1 /mm3 — ABNORMAL HIGH
RBC Count, CSF: 68 /mm3 — ABNORMAL HIGH
Tube #: 4
WBC, CSF: 0 /mm3 (ref 0–5)
WBC, CSF: 0 /mm3 (ref 0–5)

## 2024-02-11 LAB — COMPREHENSIVE METABOLIC PANEL WITH GFR
ALT: 27 U/L (ref 0–44)
AST: 24 U/L (ref 15–41)
Albumin: 4.4 g/dL (ref 3.5–5.0)
Alkaline Phosphatase: 121 U/L (ref 38–126)
Anion gap: 13 (ref 5–15)
BUN: 16 mg/dL (ref 6–20)
CO2: 22 mmol/L (ref 22–32)
Calcium: 9.4 mg/dL (ref 8.9–10.3)
Chloride: 100 mmol/L (ref 98–111)
Creatinine, Ser: 0.93 mg/dL (ref 0.44–1.00)
GFR, Estimated: >60 mL/min (ref >60)
Glucose, Bld: 194 mg/dL — ABNORMAL HIGH (ref 70–99)
Potassium: 2.5 mmol/L — CL (ref 3.5–5.1)
Sodium: 135 mmol/L (ref 135–145)
Total Bilirubin: 1.2 mg/dL (ref 0.0–1.2)
Total Protein: 7.6 g/dL (ref 6.5–8.1)

## 2024-02-11 LAB — MAGNESIUM: Magnesium: 1.9 mg/dL (ref 1.7–2.4)

## 2024-02-11 LAB — PROTEIN AND GLUCOSE, CSF
Glucose, CSF: 92 mg/dL — ABNORMAL HIGH (ref 40–70)
Total  Protein, CSF: 28 mg/dL (ref 15–45)

## 2024-02-11 MED ORDER — DIPHENHYDRAMINE HCL 50 MG/ML IJ SOLN
25.0000 mg | Freq: Once | INTRAMUSCULAR | Status: AC
Start: 2024-02-11 — End: 2024-02-11
  Administered 2024-02-11: 25 mg via INTRAVENOUS
  Filled 2024-02-11: qty 1

## 2024-02-11 MED ORDER — DEXAMETHASONE SODIUM PHOSPHATE 10 MG/ML IJ SOLN
10.0000 mg | Freq: Once | INTRAMUSCULAR | Status: AC
  Administered 2024-02-11: 10 mg via INTRAVENOUS
  Filled 2024-02-11: qty 1

## 2024-02-11 MED ORDER — POTASSIUM CHLORIDE ER 10 MEQ PO TBCR: 10.0000 meq | EXTENDED_RELEASE_TABLET | Freq: Every day | ORAL | 0 refills | Status: AC

## 2024-02-11 MED ORDER — SODIUM CHLORIDE 0.9 % IV BOLUS
1000.0000 mL | Freq: Once | INTRAVENOUS | Status: AC
  Administered 2024-02-11: 1000 mL via INTRAVENOUS

## 2024-02-11 MED ORDER — KETOROLAC TROMETHAMINE 15 MG/ML IJ SOLN
15.0000 mg | Freq: Once | INTRAMUSCULAR | Status: AC
  Administered 2024-02-11: 15 mg via INTRAVENOUS
  Filled 2024-02-11: qty 1

## 2024-02-11 MED ORDER — PROCHLORPERAZINE EDISYLATE 10 MG/2ML IJ SOLN
10.0000 mg | Freq: Once | INTRAMUSCULAR | Status: AC
  Administered 2024-02-11: 10 mg via INTRAVENOUS
  Filled 2024-02-11: qty 2

## 2024-02-11 MED ORDER — POTASSIUM CHLORIDE CRYS ER 20 MEQ PO TBCR
40.0000 meq | EXTENDED_RELEASE_TABLET | Freq: Once | ORAL | Status: AC
  Administered 2024-02-11: 40 meq via ORAL
  Filled 2024-02-11: qty 2

## 2024-02-11 MED ORDER — POTASSIUM CHLORIDE 10 MEQ/100ML IV SOLN
10.0000 meq | INTRAVENOUS | Status: AC
  Administered 2024-02-11 (×3): 10 meq via INTRAVENOUS
  Filled 2024-02-11 (×3): qty 100

## 2024-02-11 MED ORDER — DIAZEPAM 5 MG PO TABS
5.0000 mg | ORAL_TABLET | Freq: Once | ORAL | Status: AC
  Administered 2024-02-11: 5 mg via ORAL
  Filled 2024-02-11: qty 1

## 2024-02-11 MED ORDER — LIDOCAINE HCL (PF) 1 % IJ SOLN
30.0000 mL | Freq: Once | INTRAMUSCULAR | Status: AC
  Administered 2024-02-11: 30 mL
  Filled 2024-02-11: qty 30

## 2024-02-11 MED ORDER — MAGNESIUM SULFATE 2 GM/50ML IV SOLN
2.0000 g | Freq: Once | INTRAVENOUS | Status: AC
  Administered 2024-02-11: 2 g via INTRAVENOUS
  Filled 2024-02-11: qty 50

## 2024-02-11 NOTE — Progress Notes (Signed)
 Brief Neuro Note:  28F with diagnosed IIH was supposed to have stenting of transverse/sigmoid sinus junction for noted BL focal stenosis but was delayed due to GI symptoms. She is on Diamox. She is presenting with headache with migrainous features. Symptoms improved with headache cocktail but has persistent BL blurred vision.  Plan: - CT Head - Recommend LP with opening pressure, high volume tap, drain atleast 30cc. Please obtain CSF cell count, differential, protein, glucose. - continue Diamox. - Can stay at [facility]. I feel an MRI Brain would be low yield in the absence of a focal deficit. BL blurred vision is unlikely to be stroke.  Hurley Blevins [facility]

## 2024-02-11 NOTE — ED Notes (Signed)
 Pt requested to go to bathroom. Writer explained to pt after speaking with nurse and charge. Pt needs to remain lying flat for two hrs. Pt was offered bedpan or a brief pt refused both. Pt was given a ginger ale.

## 2024-02-11 NOTE — Discharge Instructions (Signed)
 Take potassium supplement daily.  Follow-up with your primary care provider for recheck of your potassium.  Follow-up with your neurosurgery team as planned.  Return to the ER as needed for worsening or concerning symptoms.

## 2024-02-11 NOTE — ED Provider Notes (Signed)
 Lumbar Puncture  Date/Time: 02/11/2024 6:00 AM  Performed by: Earma Gloss, MD Authorized by: Earma Gloss, MD   Consent:    Consent obtained:  Written and verbal   Consent given by:  Patient   Risks, benefits, and alternatives were discussed: yes     Risks discussed:  Bleeding, headache, pain, repeat procedure and infection   Alternatives discussed:  No treatment Universal protocol:    Relevant documents present and verified: yes     Test results available: yes     Imaging studies available: yes     Required blood products, implants, devices, and special equipment available: yes     Immediately prior to procedure a time out was called: yes     Site/side marked: yes   Pre-procedure details:    Procedure purpose:  Therapeutic   Preparation: Patient was prepped and draped in usual sterile fashion   Anesthesia:    Anesthesia method:  Local infiltration   Local anesthetic:  Lidocaine  1% w/o epi Procedure details:    Lumbar space:  L4-L5 interspace   Needle gauge:  20   Needle type:  Spinal needle - Quincke tip   Needle length (in):  2.5   Ultrasound guidance: no     Number of attempts:  3   Opening pressure (cm H2O):  16   Fluid appearance:  Clear   Tubes of fluid:  4   Total volume (ml):  8 Post-procedure details:    Puncture site:  Adhesive bandage applied   Procedure completion:  Warden Ha, MD 02/11/24 [phone]

## 2024-02-11 NOTE — ED Provider Notes (Signed)
 [place] EMERGENCY DEPARTMENT AT [facility] Provider Note   CSN: [id number] Arrival date & time: 02/10/24  2318     History  Chief Complaint  Patient presents with   Visual Field Change    Katie Spencer is a 43 y.o. female.  43 year old female with history of pseudo tumor cerebri / IIH presents with complaint of double/tunnel vision and headache. States onset tonight while driving to her NA meeting across the street from the hospital. Similar symptoms previously which improve with migraine cocktail, sometimes needs LP. Denies unilateral weakness/numbness. Reports GI illness with significant vomiting and diarrhea since Wednesday, diarrhea has slowed today, is tolerating electrolyte solution PO. No fevers/sick contacts/rash.        Home Medications Prior to Admission medications   Medication Sig Start Date End Date Taking? Authorizing Provider  ABILIFY MAINTENA 400 MG PRSY prefilled syringe Inject 400 mg into the muscle every 21 ( twenty-one) days. 09/27/23  Yes [provider]  butalbital-acetaminophen -caffeine (FIORICET) 50-325-40 MG tablet Take 1 tablet by mouth every 6 (six) hours as needed. 01/16/24 01/15/25 Yes [provider]  clopidogrel (PLAVIX) 75 MG tablet Take 1 tablet by mouth daily. 01/23/24  Yes [provider]  LORazepam  (ATIVAN ) 0.5 MG tablet TAKE 1 TABLET BY MOUTH TWICE DAILY AS NEEDED FOR SEVERE ANXIETY AND/OR PANIC ATTACKS 01/17/24  Yes [provider]  oxyCODONE -acetaminophen  (PERCOCET/ROXICET) 5-325 MG tablet Take 1 tablet by mouth every 6 (six) hours as needed. 01/25/24  Yes [provider]  potassium chloride  (KLOR-CON ) 10 MEQ tablet Take 1 tablet (10 mEq total) by mouth daily. 02/11/24  Yes Darlis Eisenmenger, PA-C  sodium bicarbonate 650 MG tablet  01/14/24  Yes [provider]  acetaZOLAMIDE (DIAMOX) 250 MG tablet Take 250 mg by mouth.    [provider]  albuterol  (PROVENTIL ) (2.5 MG/3ML)  0.083% nebulizer solution Take 2.5 mg by nebulization as needed for wheezing or shortness of breath. 12/30/22   [provider]  aspirin  EC 81 MG tablet Take 1 tablet (81 mg total) by mouth daily. Swallow whole. 12/23/23   Feliciana Horn, MD  atorvastatin  (LIPITOR) 40 MG tablet Take 1 tablet (40 mg total) by mouth daily. 12/22/23 12/21/24  Akula, Vijaya, MD  citalopram  (CELEXA ) 40 MG tablet Take 40 mg by mouth daily. 01/01/20   [provider]  cyclobenzaprine  (FLEXERIL ) 10 MG tablet Take 10 mg by mouth 3 (three) times daily. 10/19/23   [provider]  dicyclomine  (BENTYL ) 20 MG tablet Take 1 tablet (20 mg total) by mouth 2 (two) times daily. Patient taking differently: Take 20 mg by mouth as needed for spasms. 03/10/23   Ginnie Laine, MD  famotidine  (PEPCID ) 40 MG tablet Take 40 mg by mouth as needed for heartburn or indigestion. 11/17/21   [provider]  fluticasone (FLONASE) 50 MCG/ACT nasal spray Place 2 sprays into both nostrils as needed for allergies. 12/02/20   [provider]  gabapentin  (NEURONTIN ) 600 MG tablet Take 600 mg by mouth 3 (three) times daily. 05/19/22   [provider]  HYDROcodone -acetaminophen  (NORCO) 7.5-325 MG tablet Take 1 tablet by mouth 4 (four) times daily as needed for moderate pain (pain score 4-6). 11/22/23   [provider]  ibuprofen  (ADVIL ) 600 MG tablet Take 600 mg by mouth 3 (three) times daily as needed for mild pain (pain score 1-3). 09/21/23   [provider]  nicotine  (NICODERM CQ  - DOSED IN MG/24 HOURS) 21 mg/24hr patch Place 1  patch (21 mg total) onto the skin daily. 12/23/23   Feliciana Horn, MD  omeprazole  (PRILOSEC) 40 MG capsule Take 1 capsule (40 mg total) by mouth daily as needed (indigestion). 03/25/23   Darlis Eisenmenger, PA-C  ondansetron  (ZOFRAN -ODT) 4 MG disintegrating tablet SMARTSIG:1 Tablet(s) By Mouth Every 12 Hours PRN    [provider]  SUMAtriptan (IMITREX) 100 MG tablet  Take 100 mg by mouth every 2 (two) hours as needed for migraine. 02/09/22   [provider]  tamsulosin  (FLOMAX ) 0.4 MG CAPS capsule Take 1 capsule by mouth as needed (bladder). 05/26/21   [provider]  belladonna-opium  (B&O SUPPRETTES) 16.2-30 MG suppository Place 1 suppository rectally every 8 (eight) hours as needed for pain. Patient not taking: Reported on 05/27/2020 05/20/20 08/18/20  Ballard Bongo, MD  lansoprazole  (PREVACID ) 30 MG capsule Take 1 capsule daily while taking ibuprofen . Patient not taking: Reported on 05/27/2020 04/16/20 08/18/20  Molpus, Autry Legions, MD      Allergies    Almond (diagnostic), Diphenhydramine , Morphine  and codeine, and Nsaids    Review of Systems   Review of Systems Negative except as per HPI Physical Exam Updated Vital Signs BP 127/69   Pulse 74   Temp 98.1 F (36.7 C)   Resp 17   Ht 5\' 4"  (1.626 m)   Wt 93 kg   LMP 04/04/2020 Comment: neg preg test  SpO2 98%   BMI 35.19 kg/m  Physical Exam Vitals and nursing note reviewed.  Constitutional:      General: She is not in acute distress.    Appearance: She is well-developed. She is not diaphoretic.  HENT:     Head: Normocephalic and atraumatic.     Mouth/Throat:     Mouth: Mucous membranes are moist.  Eyes:     General: No visual field deficit.    Extraocular Movements: Extraocular movements intact.     Pupils: Pupils are equal, round, and reactive to light.  Cardiovascular:     Pulses: Normal pulses.  Pulmonary:     Effort: Pulmonary effort is normal.  Abdominal:     Palpations: Abdomen is soft.     Tenderness: There is no abdominal tenderness.  Musculoskeletal:     Right lower leg: No edema.     Left lower leg: No edema.  Skin:    General: Skin is warm and dry.     Findings: No erythema or rash.  Neurological:     Mental Status: She is alert and oriented to person, place, and time.     GCS: GCS eye subscore is 4. GCS verbal subscore is 5. GCS motor subscore is 6.      Cranial Nerves: No facial asymmetry.     Sensory: No sensory deficit.     Motor: No weakness.     Coordination: Finger-Nose-Finger Test and Heel to [employer] normal.     Gait: Gait normal.     Comments: Reports double vision in each eye +photophobia   Psychiatric:        Behavior: Behavior normal.     ED Results / Procedures / Treatments   Labs (all labs ordered are listed, but only abnormal results are displayed) Labs Reviewed  COMPREHENSIVE METABOLIC PANEL WITH GFR - Abnormal; Notable for the following components:      Result Value   Potassium 2.5 (*)    Glucose, Bld 194 (*)    All other components within normal limits  CBC WITH DIFFERENTIAL/PLATELET - Abnormal; Notable for the  following components:   WBC 14.5 (*)    Neutro Abs 9.9 (*)    All other components within normal limits  URINALYSIS, ROUTINE W REFLEX MICROSCOPIC - Abnormal; Notable for the following components:   Color, Urine STRAW (*)    Specific Gravity, Urine 1.002 (*)    All other components within normal limits  BASIC METABOLIC PANEL WITH GFR - Abnormal; Notable for the following components:   CO2 19 (*)    Glucose, Bld 235 (*)    Calcium  8.8 (*)    All other components within normal limits  CSF CULTURE W GRAM STAIN  MAGNESIUM   CSF CELL COUNT WITH DIFFERENTIAL  CSF CELL COUNT WITH DIFFERENTIAL  PROTEIN AND GLUCOSE, CSF    EKG EKG Interpretation Date/Time:  Monday Feb 11 2024 01:18:44 EDT Ventricular Rate:  79 PR Interval:  129 QRS Duration:  102 QT Interval:  430 QTC Calculation: 493 R Axis:   54  Text Interpretation: Sinus rhythm Borderline prolonged QT interval No significant change was found Confirmed by Earma Gloss [phone]) on 02/11/2024 1:32:02 AM  Radiology CT Head Wo Contrast Result Date: 02/11/2024 CLINICAL DATA:  History of idiopathic intracranial hypertension with blurry vision and headache. EXAM: CT HEAD WITHOUT CONTRAST TECHNIQUE: Contiguous axial images were obtained from the  base of the skull through the vertex without intravenous contrast. RADIATION DOSE REDUCTION: This exam was performed according to the departmental dose-optimization program which includes automated exposure control, adjustment of the mA and/or kV according to patient size and/or use of iterative reconstruction technique. COMPARISON:  Brain MRI 12/21/2023 FINDINGS: Brain: No evidence of acute infarction, hemorrhage, hydrocephalus, extra-axial collection or mass lesion/mass effect. Partially empty sella, usually nonspecific finding but possibly related to the IIH history. Vascular: No hyperdense vessel or unexpected calcification. Skull: Normal. Negative for fracture or focal lesion. Sinuses/Orbits: No acute finding. IMPRESSION: No acute or interval finding. Electronically Signed   By: Ronnette Coke M.D.   On: 02/11/2024 05:13    Procedures .Critical Care  Performed by: Darlis Eisenmenger, PA-C Authorized by: Darlis Eisenmenger, PA-C   Critical care provider statement:    Critical care time (minutes):  30   Critical care was time spent personally by me on the following activities:  Development of treatment plan with patient or surrogate, discussions with consultants, evaluation of patient's response to treatment, examination of patient, ordering and review of laboratory studies, ordering and review of radiographic studies, ordering and performing treatments and interventions, pulse oximetry, re-evaluation of patient's condition and review of old charts     Medications Ordered in ED Medications  sodium chloride  0.9 % bolus 1,000 mL (0 mLs Intravenous Stopped 02/11/24 0207)  diphenhydrAMINE  (BENADRYL ) injection 25 mg (25 mg Intravenous Given 02/11/24 0024)  prochlorperazine  (COMPAZINE ) injection 10 mg (10 mg Intravenous Given 02/11/24 0023)  dexamethasone  (DECADRON ) injection 10 mg (10 mg Intravenous Given 02/11/24 0024)  ketorolac  (TORADOL ) 15 MG/ML injection 15 mg (15 mg Intravenous Given 02/11/24 0025)   magnesium  sulfate IVPB 2 g 50 mL (0 g Intravenous Stopped 02/11/24 0304)  potassium chloride  SA (KLOR-CON  M) CR tablet 40 mEq (40 mEq Oral Given 02/11/24 0109)  potassium chloride  10 mEq in 100 mL IVPB (0 mEq Intravenous Stopped 02/11/24 0415)  lidocaine  (PF) (XYLOCAINE ) 1 % injection 30 mL (30 mLs Other Given by Other 02/11/24 0554)  diazepam  (VALIUM ) tablet 5 mg (5 mg Oral Given 02/11/24 7829)    ED Course/ Medical Decision Making/ A&P  Medical Decision Making Amount and/or Complexity of Data Reviewed Labs: ordered. Radiology: ordered.  Risk Prescription drug management.   This patient presents to the ED for concern of headache, visual disturbance, this involves an extensive number of treatment options, and is a complaint that carries with it a high risk of complications and morbidity.  The differential diagnosis includes migraine, CVA, IIH   Co morbidities / Chronic conditions that complicate the patient evaluation  IIH, renal disorder, bipolar 2, anxiety, prior cocaine abuse, states sober.   Additional history obtained:  Additional history obtained from EMR External records from outside source obtained and reviewed including prior visit to ER 12/20/23 with similar presentation of sudden onset visual change, code stroke, discussed TNK, MRI on admission with acute CVA. Patient states she followed up with her neurologist, has OP MRI that did not show infarct and her symptoms had completely resolved.  Also seen in the ER 12/31/2023 with headache, improved after medications.   Lab Tests:  I Ordered, and personally interpreted labs.  The pertinent results include: Magnesium  normal at 1.9.  CBC with mild leukocytosis at 14.55 with increase in neutrophils.  Urinalysis is unremarkable.  CMP with hypokalemia with potassium of 2.5 and elevated glucose at 194.   Imaging Studies ordered:  I ordered imaging studies including CT head  I independently  visualized and interpreted imaging which showed no acute findings I agree with the radiologist interpretation   Cardiac Monitoring: / EKG:  The patient was maintained on a cardiac monitor.  I personally viewed and interpreted the cardiac monitored which showed an underlying rhythm of: Sinus rhythm, rate 79   Problem List / ED Course / Critical interventions / Medication management  43 year old female presents with complaint of sudden onset tunnel vision with double vision and headache.  States this is similar to her prior migraines but states sometimes her headaches get better with a cocktail, sometimes she needs a therapeutic LP.  Diagnosis of IIH, was scheduled for stenting last week however was unable to have her surgery because she had a severe GI illness with vomiting and diarrhea.  Visual fields intact, symptoms improving with headache cocktail although has persistent blurry vision and dull headache.  Case was discussed with neurohospitalist, Dr. Murvin Arthurs, who has reviewed patient's records and imaging.  If blurred vision persists, recommends therapeutic LP, removal of 30 mL with send out basic labs. Plan is to obtain CT head prior to LP if blurry vision persists.  Blurry vision persists, patient consents to LP. LP completed with Dr. Alison Irvine. Labs sent. Blurry vision improved by collection of 2nd tube CSF. Plan is to hold x 2 hours and then dc to follow up with neurosurgery. Dc with a few days of Kdur for hypok, K improved on recheck BMP prior to dc.  Hypokalemic secondary to her profound vomiting and diarrhea the past several days.  She is no longer vomiting, diarrhea has slowed.  Potassium was replaced orally as well as with IV.  Also provided with IV magnesium .  Headache managed with Decadron , Toradol , Benadryl , Compazine  as well as IV fluids and magnesium . I have reviewed the patients home medicines and have made adjustments as needed   Consultations Obtained:  I requested consultation  with the neurohospitalist, Dr. Murvin Arthurs,  and discussed lab and imaging findings as well as pertinent plan - they recommend: LP if blurry vision persists.    Social Determinants of Health:  Has neurosurgery for follow up   Test / Admission - Considered:  Stable/improved  for dc.          Final Clinical Impression(s) / ED Diagnoses Final diagnoses:  Acute intractable headache, unspecified headache type  Hypokalemia  IIH (idiopathic intracranial hypertension)    Rx / DC Orders ED Discharge Orders          Ordered    potassium chloride  (KLOR-CON ) 10 MEQ tablet  Daily        02/11/24 0654              Darlis Eisenmenger, PA-C 02/11/24 1610    Earma Gloss, MD 02/12/24 1535

## 2024-02-14 LAB — CSF CULTURE W GRAM STAIN: Gram Stain: NONE SEEN

## 2024-02-20 ENCOUNTER — Encounter (HOSPITAL_BASED_OUTPATIENT_CLINIC_OR_DEPARTMENT_OTHER): Payer: Self-pay | Admitting: Emergency Medicine

## 2024-02-20 ENCOUNTER — Other Ambulatory Visit: Payer: Self-pay

## 2024-02-20 ENCOUNTER — Emergency Department (HOSPITAL_BASED_OUTPATIENT_CLINIC_OR_DEPARTMENT_OTHER): Payer: MEDICAID

## 2024-02-20 DIAGNOSIS — Y92410 Unspecified street and highway as the place of occurrence of the external cause: Secondary | ICD-10-CM | POA: Insufficient documentation

## 2024-02-20 DIAGNOSIS — Z7902 Long term (current) use of antithrombotics/antiplatelets: Secondary | ICD-10-CM | POA: Diagnosis not present

## 2024-02-20 DIAGNOSIS — Z7982 Long term (current) use of aspirin: Secondary | ICD-10-CM | POA: Diagnosis not present

## 2024-02-20 DIAGNOSIS — S53401A Unspecified sprain of right elbow, initial encounter: Secondary | ICD-10-CM | POA: Insufficient documentation

## 2024-02-20 DIAGNOSIS — W109XXA Fall (on) (from) unspecified stairs and steps, initial encounter: Secondary | ICD-10-CM | POA: Diagnosis not present

## 2024-02-20 DIAGNOSIS — M25521 Pain in right elbow: Secondary | ICD-10-CM | POA: Diagnosis present

## 2024-02-20 NOTE — ED Triage Notes (Signed)
 Pt sts she fell down 6-7 stairs at home tonight; c/o RT elbow pain, RT buttock pain

## 2024-02-21 ENCOUNTER — Emergency Department (HOSPITAL_BASED_OUTPATIENT_CLINIC_OR_DEPARTMENT_OTHER)
Admission: EM | Admit: 2024-02-21 | Discharge: 2024-02-21 | Disposition: A | Discharge status: Home / Self Care | Payer: MEDICAID | Attending: Emergency Medicine | Admitting: Emergency Medicine

## 2024-02-21 DIAGNOSIS — S53401A Unspecified sprain of right elbow, initial encounter: Secondary | ICD-10-CM

## 2024-02-21 NOTE — ED Provider Notes (Signed)
**Katie Katie**  Katie Katie   CSN: [id number] Arrival date & time: 02/20/24  2136     History  Chief Complaint  Patient presents with   Katie Katie    Katie Katie is a 43 y.o. female.  The history is provided by the patient.   Patient presents after accidental fall.  Patient reports around 7 PM on June 4 she slipped down 6 stairs and landed on her right elbow No head injury or LOC.  No neck or back pain.  No chest pain.  She has mild pain in her buttock but she is ambulatory     Home Medications Prior to Admission medications   Medication Sig Start Date End Date Taking? Authorizing Provider  ABILIFY MAINTENA 400 MG PRSY prefilled syringe Inject 400 mg into the muscle every 21 ( twenty-one) days. 09/27/23   [provider]  acetaZOLAMIDE (DIAMOX) 250 MG tablet Take 250 mg by mouth.    [provider]  albuterol  (PROVENTIL ) (2.5 MG/3ML) 0.083% nebulizer solution Take 2.5 mg by nebulization as needed for wheezing or shortness of breath. 12/30/22   [provider]  aspirin  EC 81 MG tablet Take 1 tablet (81 mg total) by mouth daily. Swallow whole. 12/23/23   Feliciana Horn, MD  atorvastatin  (LIPITOR) 40 MG tablet Take 1 tablet (40 mg total) by mouth daily. 12/22/23 12/21/24  Akula, Vijaya, MD  butalbital-acetaminophen -caffeine (FIORICET) 50-325-40 MG tablet Take 1 tablet by mouth every 6 (six) hours as needed. 01/16/24 01/15/25  [provider]  citalopram  (CELEXA ) 40 MG tablet Take 40 mg by mouth daily. 01/01/20   [provider]  clopidogrel (PLAVIX) 75 MG tablet Take 1 tablet by mouth daily. 01/23/24   [provider]  cyclobenzaprine  (FLEXERIL ) 10 MG tablet Take 10 mg by mouth 3 (three) times daily. 10/19/23   [provider]  dicyclomine  (BENTYL ) 20 MG tablet Take 1 tablet (20 mg total) by mouth 2 (two) times daily. Patient taking differently: Take 20 mg by mouth as needed for spasms. 03/10/23    Ginnie Laine, MD  famotidine  (PEPCID ) 40 MG tablet Take 40 mg by mouth as needed for heartburn or indigestion. 11/17/21   [provider]  fluticasone (FLONASE) 50 MCG/ACT nasal spray Place 2 sprays into both nostrils as needed for allergies. 12/02/20   [provider]  gabapentin  (NEURONTIN ) 600 MG tablet Take 600 mg by mouth 3 (three) times daily. 05/19/22   [provider]  HYDROcodone -acetaminophen  (NORCO) 7.5-325 MG tablet Take 1 tablet by mouth 4 (four) times daily as needed for moderate pain (pain score 4-6). 11/22/23   [provider]  ibuprofen  (ADVIL ) 600 MG tablet Take 600 mg by mouth 3 (three) times daily as needed for mild pain (pain score 1-3). 09/21/23   [provider]  LORazepam  (ATIVAN ) 0.5 MG tablet TAKE 1 TABLET BY MOUTH TWICE DAILY AS NEEDED FOR SEVERE ANXIETY AND/OR PANIC ATTACKS 01/17/24   [provider]  nicotine  (NICODERM CQ  - DOSED IN MG/24 HOURS) 21 mg/24hr patch Place 1 patch (21 mg total) onto the skin daily. 12/23/23   Feliciana Horn, MD  omeprazole  (PRILOSEC) 40 MG capsule Take 1 capsule (40 mg total) by mouth daily as needed (indigestion). 03/25/23   Darlis Eisenmenger, PA-C  ondansetron  (ZOFRAN -ODT) 4 MG disintegrating tablet SMARTSIG:1 Tablet(s) By Mouth Every 12 Hours PRN    [provider]  oxyCODONE -acetaminophen  (PERCOCET/ROXICET) 5-325 MG tablet Take 1 tablet by mouth every 6 (six)  hours as needed. 01/25/24   [provider]  potassium chloride  (KLOR-CON ) 10 MEQ tablet Take 1 tablet (10 mEq total) by mouth daily. 02/11/24   Darlis Eisenmenger, PA-C  sodium bicarbonate 650 MG tablet  01/14/24   [provider]  SUMAtriptan (IMITREX) 100 MG tablet Take 100 mg by mouth every 2 (two) hours as needed for migraine. 02/09/22   [provider]  tamsulosin  (FLOMAX ) 0.4 MG CAPS capsule Take 1 capsule by mouth as needed (bladder). 05/26/21   [provider]  belladonna-opium  (B&O SUPPRETTES)  16.2-30 MG suppository Place 1 suppository rectally every 8 (eight) hours as needed for pain. Patient not taking: Reported on 05/27/2020 05/20/20 08/18/20  Ballard Bongo, MD  lansoprazole  (PREVACID ) 30 MG capsule Take 1 capsule daily while taking ibuprofen . Patient not taking: Reported on 05/27/2020 04/16/20 08/18/20  Molpus, Autry Legions, MD      Allergies    Almond (diagnostic), Diphenhydramine , Morphine  and codeine, and Nsaids    Review of Systems   Review of Systems  Physical Exam Updated Vital Signs BP 109/67 (BP Location: Left Arm)   Pulse 72   Temp 98.4 F (36.9 C) (Oral)   Resp 17   Ht 1.626 m (5\' 4" )   Wt 93 kg   LMP 04/04/2020 Comment: neg preg test  SpO2 96%   BMI 35.19 kg/m  Physical Exam CONSTITUTIONAL: Well developed/well nourished, no visible trauma HEAD: Normocephalic/atraumatic EYES: EOMI/PERRL ENMT: Mucous membranes moist NECK: supple no meningeal signs SPINE/BACK:entire spine nontender No bruising/crepitance/stepoffs noted to spine CV: S1/S2 noted, no murmurs/rubs/gallops noted ABDOMEN: soft, nontender NEURO: Pt is awake/alert/appropriate, moves all extremitiesx4.  No facial droop.  She ambulates without difficulty EXTREMITIES: pulses normal/equal, full ROM Mild tenderness to palpation of right elbow with localized hematoma. All other extremities/joints palpated/ranged and nontender SKIN: warm, color normal PSYCH: no abnormalities of mood noted, alert and oriented to situation  ED Results / Procedures / Treatments   Labs (all labs ordered are listed, but only abnormal results are displayed) Labs Reviewed - No data to display  EKG None  Radiology DG Elbow Complete Right Result Date: 02/20/2024 EXAM: 3 VIEW(S) XRAY OF THE RIGHT ELBOW COMPARISON: None available. CLINICAL HISTORY: Fall. Patient states she fell down 6-7 stairs at home tonight, right elbow pain. FINDINGS: BONES AND JOINTS: No acute fracture. No focal osseous lesion. No joint dislocation. SOFT  TISSUES: The soft tissues are unremarkable. IMPRESSION: 1. No acute abnormality. Electronically signed by: Zadie Herter MD 02/20/2024 10:04 PM EDT RP Workstation: [id number]    Procedures .Ortho Injury Treatment  Date/Time: 02/21/2024 1:05 AM  Performed by: Eldon Greenland, MD Authorized by: Eldon Greenland, MD   Consent:    Consent obtained:  Verbal   Consent given by:  PatientInjury location: elbow Location details: right elbow Injury type: soft tissue Pre-procedure neurovascular assessment: neurovascularly intact Pre-procedure distal perfusion: normal Pre-procedure neurological function: normal Pre-procedure range of motion: reduced  Anesthesia: Local anesthesia used: no  Patient sedated: NoImmobilization: sling Splint Applied by: ED Tech Post-procedure neurovascular assessment: post-procedure neurovascularly intact Post-procedure distal perfusion: normal Post-procedure neurological function: normal Post-procedure range of motion: unchanged       Medications Ordered in ED Medications - No data to display  ED Course/ Medical Decision Making/ A&P           Glasgow Coma Scale Score: 15      NEXUS Criteria Score: 0                Medical Decision Making  Amount and/or Complexity of Data Reviewed Radiology: ordered.   Patient presents after accidental fall.  Most the pain is in her right elbow and I reviewed x-ray is negative.  No signs of any head or neck trauma.  She ambulates without difficulty.  Sling was provided for comfort and referred to orthopedics that she may need repeat x-ray if it continues        Final Clinical Impression(s) / ED Diagnoses Final diagnoses:  Elbow sprain, right, initial encounter    Rx / DC Orders ED Discharge Orders     None         Eldon Greenland, MD 02/21/24 0113

## 2024-02-21 NOTE — ED Notes (Signed)
 Provided Pt ice pack for elbow

## 2024-03-22 ENCOUNTER — Encounter (HOSPITAL_BASED_OUTPATIENT_CLINIC_OR_DEPARTMENT_OTHER): Payer: Self-pay

## 2024-03-22 ENCOUNTER — Emergency Department (HOSPITAL_BASED_OUTPATIENT_CLINIC_OR_DEPARTMENT_OTHER)
Admission: EM | Admit: 2024-03-22 | Discharge: 2024-03-23 | Disposition: A | Discharge status: Home / Self Care | Payer: MEDICAID | Attending: Emergency Medicine | Admitting: Emergency Medicine

## 2024-03-22 ENCOUNTER — Other Ambulatory Visit: Payer: Self-pay

## 2024-03-22 ENCOUNTER — Emergency Department (HOSPITAL_BASED_OUTPATIENT_CLINIC_OR_DEPARTMENT_OTHER): Payer: MEDICAID

## 2024-03-22 DIAGNOSIS — Z7902 Long term (current) use of antithrombotics/antiplatelets: Secondary | ICD-10-CM | POA: Diagnosis not present

## 2024-03-22 DIAGNOSIS — K5792 Diverticulitis of intestine, part unspecified, without perforation or abscess without bleeding: Secondary | ICD-10-CM

## 2024-03-22 DIAGNOSIS — R109 Unspecified abdominal pain: Secondary | ICD-10-CM | POA: Diagnosis present

## 2024-03-22 DIAGNOSIS — Z7982 Long term (current) use of aspirin: Secondary | ICD-10-CM | POA: Diagnosis not present

## 2024-03-22 LAB — URINALYSIS, W/ REFLEX TO CULTURE (INFECTION SUSPECTED)
Bilirubin Urine: NEGATIVE
Glucose, UA: >=500 mg/dL — AB
Ketones, ur: NEGATIVE mg/dL
Leukocytes,Ua: NEGATIVE
Nitrite: NEGATIVE
Protein, ur: 30 mg/dL — AB
Specific Gravity, Urine: 1.02 (ref 1.005–1.030)
pH: 7 (ref 5.0–8.0)

## 2024-03-22 MED ORDER — SODIUM CHLORIDE 0.9 % IV BOLUS
1000.0000 mL | Freq: Once | INTRAVENOUS | Status: AC
  Administered 2024-03-23: 1000 mL via INTRAVENOUS

## 2024-03-22 MED ORDER — FENTANYL CITRATE PF 50 MCG/ML IJ SOSY
50.0000 ug | PREFILLED_SYRINGE | Freq: Once | INTRAMUSCULAR | Status: AC
  Administered 2024-03-23: 50 ug via INTRAVENOUS
  Filled 2024-03-22: qty 1

## 2024-03-22 MED ORDER — ONDANSETRON HCL 4 MG/2ML IJ SOLN
4.0000 mg | Freq: Once | INTRAMUSCULAR | Status: AC
  Administered 2024-03-23: 4 mg via INTRAVENOUS
  Filled 2024-03-22: qty 2

## 2024-03-22 NOTE — ED Triage Notes (Signed)
 Patient arrived POV from [facility] with complaint vomiting after coughing hard, diagnosed with Bronchitis 1 week ago. Reports constant nausea after starting 2nd antibiotic 2 days ago. Reports diarrhea also starting 2 days ago.   Patient reports completed 1 course of antibiotics and was re evaluated for bronchitis was prescribed 2nd antibiotic. Patient reports prescribed steroids with re evaluation.

## 2024-03-22 NOTE — ED Provider Notes (Signed)
 [facility] Provider Note   CSN: [id number] Arrival date & time: 03/22/24  2217     Patient presents with: No chief complaint on file.   Katie Spencer is a 43 y.o. female.  {Add pertinent medical, surgical, social history, OB history to HPI:32947} Patient is a 43 year old female with past medical history of bipolar disorder, anxiety, prior surgery related to pseudotumor cerebri.  Patient presenting today with complaints of abdominal pain.  For the past 2 weeks, she has had congestion and cough.  She completed Omnicef without improvement, then was started on erythromycin 3 days ago.  She has now having abdominal discomfort along with nausea and vomiting.  No fevers or chills.  She denies any diarrhea or constipation.  No bloody stools.       Prior to Admission medications   Medication Sig Start Date End Date Taking? Authorizing Provider  ABILIFY MAINTENA 400 MG PRSY prefilled syringe Inject 400 mg into the muscle every 21 ( twenty-one) days. 09/27/23   [provider]  acetaZOLAMIDE (DIAMOX) 250 MG tablet Take 250 mg by mouth.    [provider]  albuterol  (PROVENTIL ) (2.5 MG/3ML) 0.083% nebulizer solution Take 2.5 mg by nebulization as needed for wheezing or shortness of breath. 12/30/22   [provider]  aspirin  EC 81 MG tablet Take 1 tablet (81 mg total) by mouth daily. Swallow whole. 12/23/23   Cherlyn Labella, MD  atorvastatin  (LIPITOR) 40 MG tablet Take 1 tablet (40 mg total) by mouth daily. 12/22/23 12/21/24  Akula, Vijaya, MD  butalbital-acetaminophen -caffeine (FIORICET) 50-325-40 MG tablet Take 1 tablet by mouth every 6 (six) hours as needed. 01/16/24 01/15/25  [provider]  citalopram  (CELEXA ) 40 MG tablet Take 40 mg by mouth daily. 01/01/20   [provider]  clopidogrel (PLAVIX) 75 MG tablet Take 1 tablet by mouth daily. 01/23/24   [provider]  cyclobenzaprine  (FLEXERIL ) 10 MG  tablet Take 10 mg by mouth 3 (three) times daily. 10/19/23   [provider]  dicyclomine  (BENTYL ) 20 MG tablet Take 1 tablet (20 mg total) by mouth 2 (two) times daily. Patient taking differently: Take 20 mg by mouth as needed for spasms. 03/10/23   Sandie Signe BROCKS, MD  famotidine  (PEPCID ) 40 MG tablet Take 40 mg by mouth as needed for heartburn or indigestion. 11/17/21   [provider]  fluticasone (FLONASE) 50 MCG/ACT nasal spray Place 2 sprays into both nostrils as needed for allergies. 12/02/20   [provider]  gabapentin  (NEURONTIN ) 600 MG tablet Take 600 mg by mouth 3 (three) times daily. 05/19/22   [provider]  HYDROcodone -acetaminophen  (NORCO) 7.5-325 MG tablet Take 1 tablet by mouth 4 (four) times daily as needed for moderate pain (pain score 4-6). 11/22/23   [provider]  ibuprofen  (ADVIL ) 600 MG tablet Take 600 mg by mouth 3 (three) times daily as needed for mild pain (pain score 1-3). 09/21/23   [provider]  LORazepam  (ATIVAN ) 0.5 MG tablet TAKE 1 TABLET BY MOUTH TWICE DAILY AS NEEDED FOR SEVERE ANXIETY AND/OR PANIC ATTACKS 01/17/24   [provider]  nicotine  (NICODERM CQ  - DOSED IN MG/24 HOURS) 21 mg/24hr patch Place 1 patch (21 mg total) onto the skin daily. 12/23/23   Cherlyn Labella, MD  omeprazole  (PRILOSEC) 40 MG capsule Take 1 capsule (40 mg total) by mouth daily as needed (indigestion). 03/25/23   Beverley Leita LABOR, PA-C  ondansetron  (ZOFRAN -ODT) 4 MG disintegrating tablet SMARTSIG:1  Tablet(s) By Mouth Every 12 Hours PRN    [provider]  oxyCODONE -acetaminophen  (PERCOCET/ROXICET) 5-325 MG tablet Take 1 tablet by mouth every 6 (six) hours as needed. 01/25/24   [provider]  potassium chloride  (KLOR-CON ) 10 MEQ tablet Take 1 tablet (10 mEq total) by mouth daily. 02/11/24   Beverley Leita LABOR, PA-C  sodium bicarbonate 650 MG tablet  01/14/24   [provider]  SUMAtriptan (IMITREX) 100 MG tablet  Take 100 mg by mouth every 2 (two) hours as needed for migraine. 02/09/22   [provider]  tamsulosin  (FLOMAX ) 0.4 MG CAPS capsule Take 1 capsule by mouth as needed (bladder). 05/26/21   [provider]  belladonna-opium  (B&O SUPPRETTES) 16.2-30 MG suppository Place 1 suppository rectally every 8 (eight) hours as needed for pain. Patient not taking: Reported on 05/27/2020 05/20/20 08/18/20  Haze Lonni PARAS, MD  lansoprazole  (PREVACID ) 30 MG capsule Take 1 capsule daily while taking ibuprofen . Patient not taking: Reported on 05/27/2020 04/16/20 08/18/20  Molpus, Norleen, MD    Allergies: Almond (diagnostic), Diphenhydramine , Morphine  and codeine, and Nsaids    Review of Systems  All other systems reviewed and are negative.   Updated Vital Signs BP 132/86 (BP Location: Left Arm)   Pulse 85   Temp 98.2 F (36.8 C) (Oral)   Resp (!) 22   Ht 5' 4 (1.626 m)   Wt 97.5 kg   LMP 04/04/2020 Comment: neg preg test  SpO2 97%   BMI 36.90 kg/m   Physical Exam Vitals and nursing note reviewed.  Constitutional:      General: She is not in acute distress.    Appearance: She is well-developed. She is not diaphoretic.  HENT:     Head: Normocephalic and atraumatic.  Cardiovascular:     Rate and Rhythm: Normal rate and regular rhythm.     Heart sounds: No murmur heard.    No friction rub. No gallop.  Pulmonary:     Effort: Pulmonary effort is normal. No respiratory distress.     Breath sounds: Normal breath sounds. No wheezing.  Abdominal:     General: Bowel sounds are normal. There is no distension.     Palpations: Abdomen is soft.     Tenderness: There is abdominal tenderness. There is no guarding or rebound.     Comments: There is tenderness to palpation across the upper abdomen.  Musculoskeletal:        General: Normal range of motion.     Cervical back: Normal range of motion and neck supple.  Skin:    General: Skin is warm and dry.  Neurological:     General: No  focal deficit present.     Mental Status: She is alert and oriented to person, place, and time.     (all labs ordered are listed, but only abnormal results are displayed) Labs Reviewed  URINALYSIS, W/ REFLEX TO CULTURE (INFECTION SUSPECTED) - Abnormal; Notable for the following components:      Result Value   Glucose, UA >=500 (*)    Hgb urine dipstick TRACE (*)    Protein, ur 30 (*)    Bacteria, UA FEW (*)    All other components within normal limits  LACTIC ACID, PLASMA  LACTIC ACID, PLASMA  COMPREHENSIVE METABOLIC PANEL WITH GFR  CBC WITH DIFFERENTIAL/PLATELET    EKG: None  Radiology: DG Chest 2 View Result Date: 03/22/2024 CLINICAL DATA:  Cough EXAM: CHEST - 2 VIEW COMPARISON:  11/01/2023 FINDINGS: The heart size  and mediastinal contours are within normal limits. Both lungs are clear. The visualized skeletal structures are unremarkable. IMPRESSION: No active cardiopulmonary disease. Electronically Signed   By: Luke Bun M.D.   On: 03/22/2024 22:59    {Document cardiac monitor, telemetry assessment procedure when appropriate:32947} Procedures   Medications Ordered in the ED  ondansetron  (ZOFRAN ) injection 4 mg (has no administration in time range)  sodium chloride  0.9 % bolus 1,000 mL (has no administration in time range)  fentaNYL  (SUBLIMAZE ) injection 50 mcg (has no administration in time range)      {Click here for ABCD2, HEART and other calculators REFRESH Note before signing:1}                              Medical Decision Making Amount and/or Complexity of Data Reviewed Labs: ordered. Radiology: ordered.  Risk Prescription drug management.   ***  {Document critical care time when appropriate  Document review of labs and clinical decision tools ie CHADS2VASC2, etc  Document your independent review of radiology images and any outside records  Document your discussion with family members, caretakers and with consultants  Document social determinants of  health affecting pt's care  Document your decision making why or why not admission, treatments were needed:32947:::1}   Final diagnoses:  None    ED Discharge Orders     None

## 2024-03-23 ENCOUNTER — Encounter (HOSPITAL_BASED_OUTPATIENT_CLINIC_OR_DEPARTMENT_OTHER): Payer: Self-pay

## 2024-03-23 ENCOUNTER — Emergency Department (HOSPITAL_BASED_OUTPATIENT_CLINIC_OR_DEPARTMENT_OTHER): Payer: MEDICAID

## 2024-03-23 LAB — CBC WITH DIFFERENTIAL/PLATELET
Abs Immature Granulocytes: 0.08 K/uL — ABNORMAL HIGH (ref 0.00–0.07)
Basophils Absolute: 0.1 K/uL (ref 0.0–0.1)
Basophils Relative: 1 %
Eosinophils Absolute: 0.1 K/uL (ref 0.0–0.5)
Eosinophils Relative: 1 %
HCT: 32.5 % — ABNORMAL LOW (ref 36.0–46.0)
Hemoglobin: 11.7 g/dL — ABNORMAL LOW (ref 12.0–15.0)
Immature Granulocytes: 1 %
Lymphocytes Relative: 21 %
Lymphs Abs: 2.1 K/uL (ref 0.7–4.0)
MCH: 32.3 pg (ref 26.0–34.0)
MCHC: 36 g/dL (ref 30.0–36.0)
MCV: 89.8 fL (ref 80.0–100.0)
Monocytes Absolute: 0.4 K/uL (ref 0.1–1.0)
Monocytes Relative: 4 %
Neutro Abs: 7.3 K/uL (ref 1.7–7.7)
Neutrophils Relative %: 72 %
Platelets: 305 K/uL (ref 150–400)
RBC: 3.62 MIL/uL — ABNORMAL LOW (ref 3.87–5.11)
RDW: 12 % (ref 11.5–15.5)
WBC: 10 K/uL (ref 4.0–10.5)
nRBC: 0 % (ref 0.0–0.2)

## 2024-03-23 LAB — COMPREHENSIVE METABOLIC PANEL WITH GFR
ALT: 21 U/L (ref 0–44)
AST: 19 U/L (ref 15–41)
Albumin: 4.5 g/dL (ref 3.5–5.0)
Alkaline Phosphatase: 130 U/L — ABNORMAL HIGH (ref 38–126)
Anion gap: 15 (ref 5–15)
BUN: 8 mg/dL (ref 6–20)
CO2: 22 mmol/L (ref 22–32)
Calcium: 9.6 mg/dL (ref 8.9–10.3)
Chloride: 102 mmol/L (ref 98–111)
Creatinine, Ser: 0.69 mg/dL (ref 0.44–1.00)
GFR, Estimated: >60 mL/min (ref >60)
Glucose, Bld: 219 mg/dL — ABNORMAL HIGH (ref 70–99)
Potassium: 4 mmol/L (ref 3.5–5.1)
Sodium: 138 mmol/L (ref 135–145)
Total Bilirubin: 0.3 mg/dL (ref 0.0–1.2)
Total Protein: 7.6 g/dL (ref 6.5–8.1)

## 2024-03-23 MED ORDER — METRONIDAZOLE 500 MG PO TABS: 500.0000 mg | ORAL_TABLET | Freq: Three times a day (TID) | ORAL | 0 refills | Status: AC

## 2024-03-23 MED ORDER — CIPROFLOXACIN HCL 500 MG PO TABS
500.0000 mg | ORAL_TABLET | Freq: Once | ORAL | Status: AC
  Administered 2024-03-23: 500 mg via ORAL
  Filled 2024-03-23: qty 1

## 2024-03-23 MED ORDER — CIPROFLOXACIN HCL 500 MG PO TABS
500.0000 mg | ORAL_TABLET | Freq: Two times a day (BID) | ORAL | 0 refills | Status: AC
Start: 2024-03-23 — End: ?

## 2024-03-23 MED ORDER — IOHEXOL 300 MG/ML  SOLN
100.0000 mL | Freq: Once | INTRAMUSCULAR | Status: AC | PRN
  Administered 2024-03-23: 100 mL via INTRAVENOUS

## 2024-03-23 MED ORDER — METRONIDAZOLE 500 MG PO TABS
500.0000 mg | ORAL_TABLET | Freq: Once | ORAL | Status: AC
  Administered 2024-03-23: 500 mg via ORAL
  Filled 2024-03-23: qty 1

## 2024-03-23 NOTE — ED Notes (Signed)
 Discharge instructions reviewed.   Newly prescribed medications discussed. Pharmacy verified.   Opportunity for questions and concerns provided.   Alert, oriented and ambulatory. Displays no signs of distress.

## 2024-03-23 NOTE — Discharge Instructions (Signed)
 Stop taking erythromycin.  Begin taking Cipro  and Flagyl  as prescribed.  Follow-up with primary doctor if not improving in the next few days, and return to the ER if symptoms significantly worsen or change.

## 2024-05-20 ENCOUNTER — Other Ambulatory Visit: Payer: Self-pay

## 2024-05-20 ENCOUNTER — Emergency Department (HOSPITAL_BASED_OUTPATIENT_CLINIC_OR_DEPARTMENT_OTHER): Payer: MEDICAID

## 2024-05-20 ENCOUNTER — Encounter (HOSPITAL_BASED_OUTPATIENT_CLINIC_OR_DEPARTMENT_OTHER): Payer: Self-pay | Admitting: Emergency Medicine

## 2024-05-20 ENCOUNTER — Emergency Department (HOSPITAL_BASED_OUTPATIENT_CLINIC_OR_DEPARTMENT_OTHER)
Admission: EM | Admit: 2024-05-20 | Discharge: 2024-05-21 | Disposition: A | Discharge status: Home / Self Care | Payer: MEDICAID | Attending: Emergency Medicine | Admitting: Emergency Medicine

## 2024-05-20 DIAGNOSIS — R109 Unspecified abdominal pain: Secondary | ICD-10-CM | POA: Diagnosis present

## 2024-05-20 DIAGNOSIS — R739 Hyperglycemia, unspecified: Secondary | ICD-10-CM | POA: Insufficient documentation

## 2024-05-20 DIAGNOSIS — Z7982 Long term (current) use of aspirin: Secondary | ICD-10-CM | POA: Diagnosis not present

## 2024-05-20 DIAGNOSIS — Z7984 Long term (current) use of oral hypoglycemic drugs: Secondary | ICD-10-CM | POA: Insufficient documentation

## 2024-05-20 DIAGNOSIS — R1030 Lower abdominal pain, unspecified: Secondary | ICD-10-CM | POA: Diagnosis not present

## 2024-05-20 LAB — URINALYSIS, MICROSCOPIC (REFLEX): WBC, UA: NONE SEEN WBC/hpf (ref 0–5)

## 2024-05-20 LAB — COMPREHENSIVE METABOLIC PANEL WITH GFR
ALT: 65 U/L — ABNORMAL HIGH (ref 0–44)
AST: 44 U/L — ABNORMAL HIGH (ref 15–41)
Albumin: 4.6 g/dL (ref 3.5–5.0)
Alkaline Phosphatase: 135 U/L — ABNORMAL HIGH (ref 38–126)
Anion gap: 16 — ABNORMAL HIGH (ref 5–15)
BUN: 15 mg/dL (ref 6–20)
CO2: 20 mmol/L — ABNORMAL LOW (ref 22–32)
Calcium: 9.5 mg/dL (ref 8.9–10.3)
Chloride: 99 mmol/L (ref 98–111)
Creatinine, Ser: 0.93 mg/dL (ref 0.44–1.00)
GFR, Estimated: >60 mL/min (ref >60)
Glucose, Bld: 259 mg/dL — ABNORMAL HIGH (ref 70–99)
Potassium: 3.6 mmol/L (ref 3.5–5.1)
Sodium: 136 mmol/L (ref 135–145)
Total Bilirubin: 0.3 mg/dL (ref 0.0–1.2)
Total Protein: 7.1 g/dL (ref 6.5–8.1)

## 2024-05-20 LAB — CBC
HCT: 36.2 % (ref 36.0–46.0)
Hemoglobin: 13.2 g/dL (ref 12.0–15.0)
MCH: 32.5 pg (ref 26.0–34.0)
MCHC: 36.5 g/dL — ABNORMAL HIGH (ref 30.0–36.0)
MCV: 89.2 fL (ref 80.0–100.0)
Platelets: 235 K/uL (ref 150–400)
RBC: 4.06 MIL/uL (ref 3.87–5.11)
RDW: 11.9 % (ref 11.5–15.5)
WBC: 9.3 K/uL (ref 4.0–10.5)
nRBC: 0 % (ref 0.0–0.2)

## 2024-05-20 LAB — URINALYSIS, ROUTINE W REFLEX MICROSCOPIC
Bilirubin Urine: NEGATIVE
Glucose, UA: 250 mg/dL — AB
Ketones, ur: NEGATIVE mg/dL
Leukocytes,Ua: NEGATIVE
Nitrite: NEGATIVE
Protein, ur: NEGATIVE mg/dL
Specific Gravity, Urine: 1.025 (ref 1.005–1.030)
pH: 6 (ref 5.0–8.0)

## 2024-05-20 LAB — LIPASE, BLOOD: Lipase: 24 U/L (ref 11–51)

## 2024-05-20 NOTE — ED Triage Notes (Signed)
 Pt c/o lower abd cramping x 3 hours, 1 hr pta had sharp vaginal pain. Emesis x1,  Also c/o inner thigh chafing.  Hx of hysterectomy.   Took hydrocodone  appx 2 hr pta.

## 2024-05-21 MED ORDER — DICYCLOMINE HCL 10 MG PO CAPS
10.0000 mg | ORAL_CAPSULE | Freq: Once | ORAL | Status: AC
  Administered 2024-05-21: 10 mg via ORAL
  Filled 2024-05-21: qty 1

## 2024-05-21 MED ORDER — ONDANSETRON 4 MG PO TBDP
8.0000 mg | ORAL_TABLET | Freq: Once | ORAL | Status: AC
  Administered 2024-05-21: 8 mg via ORAL
  Filled 2024-05-21: qty 2

## 2024-05-21 MED ORDER — METFORMIN HCL 500 MG PO TABS: 500.0000 mg | ORAL_TABLET | Freq: Two times a day (BID) | ORAL | 0 refills | Status: AC

## 2024-05-21 MED ORDER — DICYCLOMINE HCL 20 MG PO TABS: 20.0000 mg | ORAL_TABLET | Freq: Two times a day (BID) | ORAL | 0 refills | Status: AC

## 2024-05-21 NOTE — ED Provider Notes (Signed)
 [place] [facility]GENCY [facility]MENT AT [facility] Provider Note   CSN: [id number] Arrival date & time: 05/20/24  2058     Patient presents with: Abdominal Pain   Katie Spencer is a 43 y.o. female.   The history is provided by the patient.  Abdominal Pain Pain location:  Suprapubic Pain quality: cramping   Pain radiates to:  Does not radiate Pain severity:  Moderate Onset quality:  Sudden Timing:  Constant Progression:  Unchanged Chronicity:  New Context comment:  Had just eaten at a cook out Relieved by:  Nothing Worsened by:  Nothing Ineffective treatments:  None tried Associated symptoms: no anorexia, no chest pain, no constipation, no cough, no dysuria, no fever, no nausea and no vomiting        Prior to Admission medications   Medication Sig Start Date End Date Taking? Authorizing Provider  dicyclomine  (BENTYL ) 20 MG tablet Take 1 tablet (20 mg total) by mouth 2 (two) times daily. 05/21/24  Yes Nahzir Pohle, MD  metFORMIN  (GLUCOPHAGE ) 500 MG tablet Take 1 tablet (500 mg total) by mouth 2 (two) times daily with a meal. 05/21/24  Yes Makoto Sellitto, MD  ABILIFY MAINTENA 400 MG PRSY prefilled syringe Inject 400 mg into the muscle every 21 ( twenty-one) days. 09/27/23   [provider]  acetaZOLAMIDE (DIAMOX) 250 MG tablet Take 250 mg by mouth.    [provider]  albuterol  (PROVENTIL ) (2.5 MG/3ML) 0.083% nebulizer solution Take 2.5 mg by nebulization as needed for wheezing or shortness of breath. 12/30/22   [provider]  aspirin  EC 81 MG tablet Take 1 tablet (81 mg total) by mouth daily. Swallow whole. 12/23/23   Cherlyn Labella, MD  atorvastatin  (LIPITOR) 40 MG tablet Take 1 tablet (40 mg total) by mouth daily. 12/22/23 12/21/24  Akula, Vijaya, MD  butalbital-acetaminophen -caffeine (FIORICET) 50-325-40 MG tablet Take 1 tablet by mouth every 6 (six) hours as needed. 01/16/24 01/15/25  [provider]  ciprofloxacin  (CIPRO ) 500 MG tablet  Take 1 tablet (500 mg total) by mouth 2 (two) times daily. One po bid x 7 days 03/23/24   Geroldine Berg, MD  citalopram  (CELEXA ) 40 MG tablet Take 40 mg by mouth daily. 01/01/20   [provider]  clopidogrel (PLAVIX) 75 MG tablet Take 1 tablet by mouth daily. 01/23/24   [provider]  cyclobenzaprine  (FLEXERIL ) 10 MG tablet Take 10 mg by mouth 3 (three) times daily. 10/19/23   [provider]  dicyclomine  (BENTYL ) 20 MG tablet Take 1 tablet (20 mg total) by mouth 2 (two) times daily. Patient taking differently: Take 20 mg by mouth as needed for spasms. 03/10/23   Sandie Signe BROCKS, MD  famotidine  (PEPCID ) 40 MG tablet Take 40 mg by mouth as needed for heartburn or indigestion. 11/17/21   [provider]  fluticasone (FLONASE) 50 MCG/ACT nasal spray Place 2 sprays into both nostrils as needed for allergies. 12/02/20   [provider]  gabapentin  (NEURONTIN ) 600 MG tablet Take 600 mg by mouth 3 (three) times daily. 05/19/22   [provider]  HYDROcodone -acetaminophen  (NORCO) 7.5-325 MG tablet Take 1 tablet by mouth 4 (four) times daily as needed for moderate pain (pain score 4-6). 11/22/23   [provider]  ibuprofen  (ADVIL ) 600 MG tablet Take 600 mg by mouth 3 (three) times daily as needed for mild pain (pain score 1-3). 09/21/23   [provider]  LORazepam  (ATIVAN ) 0.5 MG tablet TAKE 1 TABLET BY MOUTH TWICE DAILY AS  NEEDED FOR SEVERE ANXIETY AND/OR PANIC ATTACKS 01/17/24   [provider]  metroNIDAZOLE  (FLAGYL ) 500 MG tablet Take 1 tablet (500 mg total) by mouth 3 (three) times daily. 03/23/24   Geroldine Berg, MD  nicotine  (NICODERM CQ  - DOSED IN MG/24 HOURS) 21 mg/24hr patch Place 1 patch (21 mg total) onto the skin daily. 12/23/23   Cherlyn Labella, MD  omeprazole  (PRILOSEC) 40 MG capsule Take 1 capsule (40 mg total) by mouth daily as needed (indigestion). 03/25/23   Beverley Leita LABOR, PA-C  ondansetron  (ZOFRAN -ODT) 4 MG disintegrating  tablet SMARTSIG:1 Tablet(s) By Mouth Every 12 Hours PRN    [provider]  oxyCODONE -acetaminophen  (PERCOCET/ROXICET) 5-325 MG tablet Take 1 tablet by mouth every 6 (six) hours as needed. 01/25/24   [provider]  potassium chloride  (KLOR-CON ) 10 MEQ tablet Take 1 tablet (10 mEq total) by mouth daily. 02/11/24   Beverley Leita LABOR, PA-C  sodium bicarbonate 650 MG tablet  01/14/24   [provider]  SUMAtriptan (IMITREX) 100 MG tablet Take 100 mg by mouth every 2 (two) hours as needed for migraine. 02/09/22   [provider]  tamsulosin  (FLOMAX ) 0.4 MG CAPS capsule Take 1 capsule by mouth as needed (bladder). 05/26/21   [provider]  belladonna-opium  (B&O SUPPRETTES) 16.2-30 MG suppository Place 1 suppository rectally every 8 (eight) hours as needed for pain. Patient not taking: Reported on 05/27/2020 05/20/20 08/18/20  Haze Lonni PARAS, MD  lansoprazole  (PREVACID ) 30 MG capsule Take 1 capsule daily while taking ibuprofen . Patient not taking: Reported on 05/27/2020 04/16/20 08/18/20  Molpus, Norleen, MD    Allergies: Almond (diagnostic), Diphenhydramine , Morphine  and codeine, and Nsaids    Review of Systems  Constitutional:  Negative for fever.  Respiratory:  Negative for cough.   Cardiovascular:  Negative for chest pain.  Gastrointestinal:  Positive for abdominal pain. Negative for anorexia, constipation, nausea and vomiting.  Genitourinary:  Negative for dysuria.  All other systems reviewed and are negative.   Updated Vital Signs BP 122/75   Pulse 76   Temp 100 F (37.8 C) (Oral)   Resp 16   Ht 5' 4 (1.626 m)   Wt 97.5 kg   LMP 04/04/2020 Comment: neg preg test  SpO2 99%   BMI 36.90 kg/m   Physical Exam Vitals and nursing note reviewed.  Constitutional:      General: She is not in acute distress.    Appearance: Normal appearance. She is well-developed.  HENT:     Head: Normocephalic and atraumatic.     Nose: Nose normal.  Eyes:      Pupils: Pupils are equal, round, and reactive to light.  Cardiovascular:     Rate and Rhythm: Normal rate and regular rhythm.     Pulses: Normal pulses.     Heart sounds: Normal heart sounds.  Pulmonary:     Effort: Pulmonary effort is normal. No respiratory distress.     Breath sounds: Normal breath sounds.  Abdominal:     General: There is no distension.     Palpations: Abdomen is soft.     Tenderness: There is no abdominal tenderness. There is no guarding or rebound.     Comments: Increased gas throughout   Musculoskeletal:        General: Normal range of motion.     Cervical back: Neck supple.  Skin:    General: Skin is dry.     Capillary Refill: Capillary refill takes less than 2 seconds.  Findings: No erythema or rash.  Neurological:     General: No focal deficit present.     Mental Status: She is alert.     Deep Tendon Reflexes: Reflexes normal.  Psychiatric:        Mood and Affect: Mood normal.     (all labs ordered are listed, but only abnormal results are displayed) Results for orders placed or performed during the hospital encounter of 05/20/24  Lipase, blood   Collection Time: 05/20/24  9:14 PM  Result Value Ref Range   Lipase 24 11 - 51 U/L  Comprehensive metabolic panel   Collection Time: 05/20/24  9:14 PM  Result Value Ref Range   Sodium 136 135 - 145 mmol/L   Potassium 3.6 3.5 - 5.1 mmol/L   Chloride 99 98 - 111 mmol/L   CO2 20 (L) 22 - 32 mmol/L   Glucose, Bld 259 (H) 70 - 99 mg/dL   BUN 15 6 - 20 mg/dL   Creatinine, Ser 9.06 0.44 - 1.00 mg/dL   Calcium  9.5 8.9 - 10.3 mg/dL   Total Protein 7.1 6.5 - 8.1 g/dL   Albumin 4.6 3.5 - 5.0 g/dL   AST 44 (H) 15 - 41 U/L   ALT 65 (H) 0 - 44 U/L   Alkaline Phosphatase 135 (H) 38 - 126 U/L   Total Bilirubin 0.3 0.0 - 1.2 mg/dL   GFR, Estimated >39 >39 mL/min   Anion gap 16 (H) 5 - 15  CBC   Collection Time: 05/20/24  9:14 PM  Result Value Ref Range   WBC 9.3 4.0 - 10.5 K/uL   RBC 4.06 3.87 - 5.11  MIL/uL   Hemoglobin 13.2 12.0 - 15.0 g/dL   HCT 63.7 63.9 - 53.9 %   MCV 89.2 80.0 - 100.0 fL   MCH 32.5 26.0 - 34.0 pg   MCHC 36.5 (H) 30.0 - 36.0 g/dL   RDW 88.0 88.4 - 84.4 %   Platelets 235 150 - 400 K/uL   nRBC 0.0 0.0 - 0.2 %  Urinalysis, Routine w reflex microscopic -Urine, Clean Catch   Collection Time: 05/20/24 10:41 PM  Result Value Ref Range   Color, Urine YELLOW YELLOW   APPearance CLEAR CLEAR   Specific Gravity, Urine 1.025 1.005 - 1.030   pH 6.0 5.0 - 8.0   Glucose, UA 250 (A) NEGATIVE mg/dL   Hgb urine dipstick TRACE (A) NEGATIVE   Bilirubin Urine NEGATIVE NEGATIVE   Ketones, ur NEGATIVE NEGATIVE mg/dL   Protein, ur NEGATIVE NEGATIVE mg/dL   Nitrite NEGATIVE NEGATIVE   Leukocytes,Ua NEGATIVE NEGATIVE  Urinalysis, Microscopic (reflex)   Collection Time: 05/20/24 10:41 PM  Result Value Ref Range   RBC / HPF 0-5 0 - 5 RBC/hpf   WBC, UA NONE SEEN 0 - 5 WBC/hpf   Bacteria, UA RARE (A) NONE SEEN   Squamous Epithelial / HPF 0-5 0 - 5 /HPF   CT Renal Stone Study Result Date: 05/21/2024 CLINICAL DATA:  Lower abdominal cramping and flank pain. Stone suspected. EXAM: CT ABDOMEN AND PELVIS WITHOUT CONTRAST TECHNIQUE: Multidetector CT imaging of the abdomen and pelvis was performed following the standard protocol without IV contrast. RADIATION DOSE REDUCTION: This exam was performed according to the departmental dose-optimization program which includes automated exposure control, adjustment of the mA and/or kV according to patient size and/or use of iterative reconstruction technique. COMPARISON:  Large number of prior CTs back to 2014. The 2 most recent both with contrast, dated 04/16/2024 and 03/23/2024.  FINDINGS: Lower chest: There are increased atelectatic changes in the lung bases. No focal infiltrate. Mild chronic elevation right hemidiaphragm. The cardiac size is normal. Hepatobiliary: Enlarged measuring 25 cm in length, with moderate to severe interval increased hepatic  steatosis. No focal lesion is seen without contrast. The gallbladder is absent without biliary dilatation. Pancreas: Partially atrophic. No focal abnormality without contrast. No ductal dilatation. Spleen: Slightly prominent, 13.1 cm.  No mass is seen. Adrenals/Urinary Tract: No adrenal mass is seen and no contour deforming abnormality of the unenhanced kidneys. There is a solitary punctate nonobstructive caliceal stone in the lower pole of the left kidney. No intrarenal stone is seen on the right. Bilaterally no ureteral stone or hydronephrosis is seen. Bladder is unremarkable. Stomach/Bowel: Unremarkable gastric wall. Normal caliber unopacified small bowel. No inflammatory change. Normal appendix. Pancolonic diverticulosis. No evidence of acute colitis or diverticulitis. Fatty infiltration in the wall of the ascending and transverse colon which can be a normal variant but may also be due to prior colitis. Vascular/Lymphatic: No significant vascular findings are present. No enlarged abdominal or pelvic lymph nodes. Reproductive: Status post hysterectomy. No adnexal masses. Other: None. Musculoskeletal: No acute or significant osseous findings. IMPRESSION: 1. No acute noncontrast CT abnormality. 2. Nonobstructive solitary punctate left lower pole renal stone. No ureteral stone or hydronephrosis. 3. Hepatomegaly with moderate to severe interval increased hepatic steatosis. 4. Slightly prominent spleen. 5. Diverticulosis without evidence of diverticulitis. 6. Fatty infiltration in the wall of the ascending and transverse colon which can be a normal variant but may also be due to prior colitis, seen previously. Electronically Signed   By: Francis Quam M.D.   On: 05/21/2024 00:27    EKG: EKG Interpretation Date/Time:  Tuesday May 20 2024 21:08:05 EDT Ventricular Rate:  110 PR Interval:  129 QRS Duration:  104 QT Interval:  321 QTC Calculation: 435 R Axis:   79  Text Interpretation: Sinus tachycardia  Confirmed by Zakkery Dorian ([id number]) on 05/20/2024 11:58:34 PM  Radiology: CT Renal Stone Study Result Date: 05/21/2024 CLINICAL DATA:  Lower abdominal cramping and flank pain. Stone suspected. EXAM: CT ABDOMEN AND PELVIS WITHOUT CONTRAST TECHNIQUE: Multidetector CT imaging of the abdomen and pelvis was performed following the standard protocol without IV contrast. RADIATION DOSE REDUCTION: This exam was performed according to the departmental dose-optimization program which includes automated exposure control, adjustment of the mA and/or kV according to patient size and/or use of iterative reconstruction technique. COMPARISON:  Large number of prior CTs back to 2014. The 2 most recent both with contrast, dated 04/16/2024 and 03/23/2024. FINDINGS: Lower chest: There are increased atelectatic changes in the lung bases. No focal infiltrate. Mild chronic elevation right hemidiaphragm. The cardiac size is normal. Hepatobiliary: Enlarged measuring 25 cm in length, with moderate to severe interval increased hepatic steatosis. No focal lesion is seen without contrast. The gallbladder is absent without biliary dilatation. Pancreas: Partially atrophic. No focal abnormality without contrast. No ductal dilatation. Spleen: Slightly prominent, 13.1 cm.  No mass is seen. Adrenals/Urinary Tract: No adrenal mass is seen and no contour deforming abnormality of the unenhanced kidneys. There is a solitary punctate nonobstructive caliceal stone in the lower pole of the left kidney. No intrarenal stone is seen on the right. Bilaterally no ureteral stone or hydronephrosis is seen. Bladder is unremarkable. Stomach/Bowel: Unremarkable gastric wall. Normal caliber unopacified small bowel. No inflammatory change. Normal appendix. Pancolonic diverticulosis. No evidence of acute colitis or diverticulitis. Fatty infiltration in the wall of the ascending and transverse  colon which can be a normal variant but may also be due to prior colitis.  Vascular/Lymphatic: No significant vascular findings are present. No enlarged abdominal or pelvic lymph nodes. Reproductive: Status post hysterectomy. No adnexal masses. Other: None. Musculoskeletal: No acute or significant osseous findings. IMPRESSION: 1. No acute noncontrast CT abnormality. 2. Nonobstructive solitary punctate left lower pole renal stone. No ureteral stone or hydronephrosis. 3. Hepatomegaly with moderate to severe interval increased hepatic steatosis. 4. Slightly prominent spleen. 5. Diverticulosis without evidence of diverticulitis. 6. Fatty infiltration in the wall of the ascending and transverse colon which can be a normal variant but may also be due to prior colitis, seen previously. Electronically Signed   By: Francis Quam M.D.   On: 05/21/2024 00:27     Procedures   Medications Ordered in the ED  dicyclomine  (BENTYL ) capsule 10 mg (10 mg Oral Given 05/21/24 0013)  ondansetron  (ZOFRAN -ODT) disintegrating tablet 8 mg (8 mg Oral Given 05/21/24 0011)                                    Medical Decision Making Cramping etc   Amount and/or Complexity of Data Reviewed External Data Reviewed: notes.    Details: Previous notes reviewed  Labs: ordered.    Details: Urine is negative for uti but has glucose.  Normal sodium 136, normal potassium 3.6, normal creatinine.  Normal lipase 24.   Radiology: ordered and independent interpretation performed.    Details: Negative by me   Risk Prescription drug management. Risk Details: Is eating and drinking energy drink in room.  Will start bentyl  for cramping.  Will also start metformin  as glucose is elevated in the ED on multiple presentations and there is glucose in the urine.  I have informed the patient of this verbally and on DC papers and have informed patient of need for close follow up.  Stable for discharge.  Strict returns.       Final diagnoses:  Abdominal cramping  Hyperglycemia    No signs of systemic illness or  infection. The patient is nontoxic-appearing on exam and vital signs are within normal limits.  I have reviewed the triage vital signs and the nursing notes. Pertinent labs & imaging results that were available during my care of the patient were reviewed by me and considered in my medical decision making (see chart for details). After history, exam, and medical workup I feel the patient has been appropriately medically screened and is safe for discharge home. Pertinent diagnoses were discussed with the patient. Patient was given return precautions.  ED Discharge Orders          Ordered    dicyclomine  (BENTYL ) 20 MG tablet  2 times daily        05/21/24 0041    metFORMIN  (GLUCOPHAGE ) 500 MG tablet  2 times daily with meals        05/21/24 0041               Alyx Mcguirk, MD 05/21/24 [id number]

## 2024-06-03 ENCOUNTER — Other Ambulatory Visit: Payer: Self-pay

## 2024-06-03 ENCOUNTER — Encounter (HOSPITAL_BASED_OUTPATIENT_CLINIC_OR_DEPARTMENT_OTHER): Payer: Self-pay | Admitting: Emergency Medicine

## 2024-06-03 DIAGNOSIS — W2107XA Struck by softball, initial encounter: Secondary | ICD-10-CM | POA: Insufficient documentation

## 2024-06-03 DIAGNOSIS — Z7902 Long term (current) use of antithrombotics/antiplatelets: Secondary | ICD-10-CM | POA: Insufficient documentation

## 2024-06-03 DIAGNOSIS — R32 Unspecified urinary incontinence: Secondary | ICD-10-CM | POA: Insufficient documentation

## 2024-06-03 DIAGNOSIS — M545 Low back pain, unspecified: Secondary | ICD-10-CM | POA: Diagnosis not present

## 2024-06-03 DIAGNOSIS — Z7982 Long term (current) use of aspirin: Secondary | ICD-10-CM | POA: Diagnosis not present

## 2024-06-03 DIAGNOSIS — S3992XA Unspecified injury of lower back, initial encounter: Secondary | ICD-10-CM | POA: Diagnosis present

## 2024-06-03 NOTE — ED Triage Notes (Signed)
 Pt reports she was hit in the middle, lower back at 1930 tonight with a fast-pitched softball while attending a game; pt was seen for same at [facility], pt reports she has been unable to pick her meds tonight, pt reports she lost bladder control while driving and has pins/needles feeling to her groin x 30 mins ago; pt reports she took 10mg  flexeril  x 30 mins ago

## 2024-06-04 ENCOUNTER — Emergency Department (HOSPITAL_COMMUNITY): Payer: MEDICAID

## 2024-06-04 ENCOUNTER — Emergency Department (HOSPITAL_BASED_OUTPATIENT_CLINIC_OR_DEPARTMENT_OTHER)
Admission: EM | Admit: 2024-06-04 | Discharge: 2024-06-04 | Disposition: A | Discharge status: Home / Self Care | Payer: MEDICAID | Attending: Emergency Medicine | Admitting: Emergency Medicine

## 2024-06-04 DIAGNOSIS — S300XXA Contusion of lower back and pelvis, initial encounter: Secondary | ICD-10-CM

## 2024-06-04 DIAGNOSIS — R32 Unspecified urinary incontinence: Secondary | ICD-10-CM

## 2024-06-04 MED ORDER — ACETAMINOPHEN 500 MG PO TABS
1000.0000 mg | ORAL_TABLET | Freq: Once | ORAL | Status: AC
  Administered 2024-06-04: 1000 mg via ORAL
  Filled 2024-06-04: qty 2

## 2024-06-04 MED ORDER — LORAZEPAM 2 MG/ML IJ SOLN: 0.5000 mg | Freq: Once | INTRAMUSCULAR | Status: DC

## 2024-06-04 MED ORDER — LORAZEPAM 1 MG PO TABS
1.0000 mg | ORAL_TABLET | Freq: Once | ORAL | Status: AC
  Administered 2024-06-04: 1 mg via ORAL
  Filled 2024-06-04: qty 1

## 2024-06-04 MED ORDER — LORAZEPAM 2 MG/ML IJ SOLN: 1.0000 mg | Freq: Once | INTRAMUSCULAR | Status: DC

## 2024-06-04 NOTE — ED Provider Notes (Signed)
 [place] [facility] AT [facility] Provider Note   CSN: [id number] Arrival date & time: 06/03/24  2349     Patient presents with: Back Injury   Katie Spencer is a 43 y.o. female.   Patient is a 43 year old female with history of chronic back pain, bipolar disorder, prediabetes.  Patient presenting today with complaints of low back injury.  She reports being at a fast pitch softball game when she was struck in the back with a softball during warm-ups.  She initially went to the ER at [facility] where x-rays were performed and were negative.  She was prescribed a medication which she has not been able to fill.  While she was driving home, she reports an episode of urinary incontinence along with a pins-and-needles sensation in her groin.  Patient was able to ambulate to the exam room.       Prior to Admission medications   Medication Sig Start Date End Date Taking? Authorizing Provider  ABILIFY MAINTENA 400 MG PRSY prefilled syringe Inject 400 mg into the muscle every 21 ( twenty-one) days. 09/27/23   [provider]  acetaZOLAMIDE (DIAMOX) 250 MG tablet Take 250 mg by mouth.    [provider]  albuterol  (PROVENTIL ) (2.5 MG/3ML) 0.083% nebulizer solution Take 2.5 mg by nebulization as needed for wheezing or shortness of breath. 12/30/22   [provider]  aspirin  EC 81 MG tablet Take 1 tablet (81 mg total) by mouth daily. Swallow whole. 12/23/23   Cherlyn Labella, MD  atorvastatin  (LIPITOR) 40 MG tablet Take 1 tablet (40 mg total) by mouth daily. 12/22/23 12/21/24  Akula, Vijaya, MD  butalbital-acetaminophen -caffeine (FIORICET) 50-325-40 MG tablet Take 1 tablet by mouth every 6 (six) hours as needed. 01/16/24 01/15/25  [provider]  ciprofloxacin  (CIPRO ) 500 MG tablet Take 1 tablet (500 mg total) by mouth 2 (two) times daily. One po bid x 7 days 03/23/24   Geroldine Berg, MD  citalopram  (CELEXA ) 40 MG tablet Take 40 mg by mouth daily.  01/01/20   [provider]  clopidogrel (PLAVIX) 75 MG tablet Take 1 tablet by mouth daily. 01/23/24   [provider]  cyclobenzaprine  (FLEXERIL ) 10 MG tablet Take 10 mg by mouth 3 (three) times daily. 10/19/23   [provider]  dicyclomine  (BENTYL ) 20 MG tablet Take 1 tablet (20 mg total) by mouth 2 (two) times daily. Patient taking differently: Take 20 mg by mouth as needed for spasms. 03/10/23   Sandie Signe BROCKS, MD  dicyclomine  (BENTYL ) 20 MG tablet Take 1 tablet (20 mg total) by mouth 2 (two) times daily. 05/21/24   Palumbo, April, MD  famotidine  (PEPCID ) 40 MG tablet Take 40 mg by mouth as needed for heartburn or indigestion. 11/17/21   [provider]  fluticasone (FLONASE) 50 MCG/ACT nasal spray Place 2 sprays into both nostrils as needed for allergies. 12/02/20   [provider]  gabapentin  (NEURONTIN ) 600 MG tablet Take 600 mg by mouth 3 (three) times daily. 05/19/22   [provider]  HYDROcodone -acetaminophen  (NORCO) 7.5-325 MG tablet Take 1 tablet by mouth 4 (four) times daily as needed for moderate pain (pain score 4-6). 11/22/23   [provider]  ibuprofen  (ADVIL ) 600 MG tablet Take 600 mg by mouth 3 (three) times daily as needed for mild pain (pain score 1-3). 09/21/23   [provider]  LORazepam  (ATIVAN ) 0.5 MG tablet TAKE 1 TABLET BY MOUTH TWICE DAILY AS NEEDED FOR SEVERE ANXIETY AND/OR PANIC ATTACKS  01/17/24   [provider]  metFORMIN  (GLUCOPHAGE ) 500 MG tablet Take 1 tablet (500 mg total) by mouth 2 (two) times daily with a meal. 05/21/24   Palumbo, April, MD  metroNIDAZOLE  (FLAGYL ) 500 MG tablet Take 1 tablet (500 mg total) by mouth 3 (three) times daily. 03/23/24   Geroldine Berg, MD  nicotine  (NICODERM CQ  - DOSED IN MG/24 HOURS) 21 mg/24hr patch Place 1 patch (21 mg total) onto the skin daily. 12/23/23   Cherlyn Labella, MD  omeprazole  (PRILOSEC) 40 MG capsule Take 1 capsule (40 mg total) by mouth daily as needed  (indigestion). 03/25/23   Beverley Leita LABOR, PA-C  ondansetron  (ZOFRAN -ODT) 4 MG disintegrating tablet SMARTSIG:1 Tablet(s) By Mouth Every 12 Hours PRN    [provider]  oxyCODONE -acetaminophen  (PERCOCET/ROXICET) 5-325 MG tablet Take 1 tablet by mouth every 6 (six) hours as needed. 01/25/24   [provider]  potassium chloride  (KLOR-CON ) 10 MEQ tablet Take 1 tablet (10 mEq total) by mouth daily. 02/11/24   Beverley Leita LABOR, PA-C  sodium bicarbonate 650 MG tablet  01/14/24   [provider]  SUMAtriptan (IMITREX) 100 MG tablet Take 100 mg by mouth every 2 (two) hours as needed for migraine. 02/09/22   [provider]  tamsulosin  (FLOMAX ) 0.4 MG CAPS capsule Take 1 capsule by mouth as needed (bladder). 05/26/21   [provider]  belladonna-opium  (B&O SUPPRETTES) 16.2-30 MG suppository Place 1 suppository rectally every 8 (eight) hours as needed for pain. Patient not taking: Reported on 05/27/2020 05/20/20 08/18/20  Haze Lonni PARAS, MD  lansoprazole  (PREVACID ) 30 MG capsule Take 1 capsule daily while taking ibuprofen . Patient not taking: Reported on 05/27/2020 04/16/20 08/18/20  Molpus, Norleen, MD    Allergies: Almond (diagnostic), Diphenhydramine , Morphine  and codeine, and Nsaids    Review of Systems  All other systems reviewed and are negative.   Updated Vital Signs BP 114/66 (BP Location: Right Arm)   Pulse 100   Temp 98.2 F (36.8 C) (Oral)   Resp 17   Ht 5' 4 (1.626 m)   Wt 97.5 kg   LMP 04/04/2020 Comment: neg preg test  SpO2 98%   BMI 36.90 kg/m   Physical Exam Vitals and nursing note reviewed.  Constitutional:      Appearance: Normal appearance.  HENT:     Head: Normocephalic and atraumatic.  Pulmonary:     Effort: Pulmonary effort is normal.  Musculoskeletal:     Comments: There is tenderness to palpation in the soft tissues of the lower lumbar region.  No bony tenderness or step-off.  Skin:    General: Skin is warm and dry.   Neurological:     Mental Status: She is alert and oriented to person, place, and time.     Comments: Strength is 5 out of 5 in both lower extremities.  Sensation is intact throughout both lower extremities.  She is able to ambulate, but with an antalgic gait.     (all labs ordered are listed, but only abnormal results are displayed) Labs Reviewed - No data to display  EKG: None  Radiology: No results found.   Procedures   Medications Ordered in the ED - No data to display                                  Medical Decision Making Amount and/or Complexity of Data Reviewed Radiology: ordered.   Patient describing paresthesias  to her groin and urinary incontinence after being struck in the back by a softball.  Her x-[facility] at [facility] were negative.  Given the nature of her symptoms, I feel as though an MRI is indicated which we do not have at [facility].  Patient will be transferred by private auto to [facility] for an MRI to further evaluate.  Dr. Raford aware and accepts patient in transfer.     Final diagnoses:  None    ED Discharge Orders     None          Geroldine Berg, MD 06/04/24 [phone]

## 2024-06-04 NOTE — ED Provider Triage Note (Signed)
 Emergency Medicine Provider Triage Evaluation Note  Katie Spencer , a 43 y.o. female  was evaluated in triage.  Pt complains of sent from [facility] ED. Patient with 2 episodes of urinary incontinence and tingling in BL LE after being hit in back by softball. Patient phobic so I provided her with Ativan .  Also provided with Tylenol  for pain.  Review of Systems  Positive:  Negative:   Physical Exam  BP 114/66 (BP Location: Right Arm)   Pulse 100   Temp 98.2 F (36.8 C) (Oral)   Resp 17   Ht 5' 4 (1.626 m)   Wt 97.5 kg   LMP 04/04/2020 Comment: neg preg test  SpO2 98%   BMI 36.90 kg/m  Gen:   Awake, no distress   Resp:  Normal effort  MSK:   Moves extremities without difficulty  Other:    Medical Decision Making  Medically screening exam initiated at 1:37 AM.  Appropriate orders placed.  Katie Spencer was informed that the remainder of the evaluation will be completed by another provider, this initial triage assessment does not replace that evaluation, and the importance of remaining in the ED until their evaluation is complete.     Hoy Fraction F, [place] 06/04/24 [phone]

## 2024-06-04 NOTE — Discharge Instructions (Addendum)
 As discussed,  please follow-up with your primary care provider.  Seek emergency care if experiencing any new or worsening symptoms.  Alternating between 650 mg Tylenol  and 400 mg Advil : The best way to alternate taking Acetaminophen  (example Tylenol ) and Ibuprofen  (example Advil /Motrin ) is to take them 3 hours apart. For example, if you take ibuprofen  at 6 am you can then take Tylenol  at 9 am. You can continue this regimen throughout the day, making sure you do not exceed the recommended maximum dose for each drug.

## 2024-06-04 NOTE — ED Provider Notes (Signed)
     Accepted handoff at shift change from Dr. Geroldine. Please see prior provider note for more detail.   Briefly: Patient is 43 y.o. Patient presenting today with complaints of low back injury. She reports being at a fast pitch softball game when she was struck in the back with a softball during warm-ups. She initially went to the ER at [facility] where x-rays were performed and were negative. She was prescribed a medication which she has not been able to fill. While she was driving home, she reports an episode of urinary incontinence along with a pins-and-needles sensation in her groin. Patient was able to ambulate to the exam room.  Plan:  - dispo pending MRI lumbar spine. - MRI without acute concerns - no sign of cauda equina syndrome.  - During my re-evaluation with patient, she continues to complain of decreased sensation to left leg. However, the decreased sensation does not appear to follow a specific dermatome. Entire lower leg is severely with decreased sensation which resolves around the knee cap. Patient has intact normal sensation of left anterior knee cap. Patient then with more mild decreased sensation to  entire left thigh. +2 pedal pulses. No swelling, bruising, step-offs, or crepitus palpated on area where softball hit the patient on the lumbar spine. Patient has been ambulatory with steady gait while in ED. - Shared all results with patient.  Answered all questions.  Educated patient that she need to follow-up with primary care.  Also educated patient on Advil /Tylenol  dosage.  Patient verbalized understanding of plan. - staffed with Dr. Griselda. -Patient afebrile with stable vitals.  Provided with return precautions.  Discharged in good condition.   Hoy Nidia FALCON, [place] 06/04/24 [phone]    Griselda Norris, MD 06/04/24 [phone]

## 2024-06-17 NOTE — ED Provider Notes (Signed)
 [facility]  Emergency Medicine Care Note   Chief Complaint  Patient presents with  . Flank Pain    History   43 year old female presents complaining of back and flank pain.  Patient states she was riding her horse earlier today when the horse tripped and she fell off of her horse onto her hip on the right and then rolled on the ground.  She denies any head injury or loss of consciousness.  She has been ambulatory with pain since then.  Then later in the day she developed sudden onset right flank and upper back pain.  She  tried half Norco that she takes for chronic back pain without any relief.  No fevers or URI symptoms.  No chest or abdominal pain. Per the chart the patient is on anticoagulation but she denies it.    Medical History[1] Surgical History[2] Family History[3] Social History[4]     Review of Systems Review of Systems  Constitutional:  Negative for chills and fever.  HENT:  Negative for congestion, rhinorrhea and sore throat.   Eyes:  Negative for discharge.  Respiratory:  Negative for cough and shortness of breath.   Cardiovascular:  Negative for chest pain.  Gastrointestinal:  Negative for abdominal pain, diarrhea, nausea and vomiting.  Genitourinary:  Positive for flank pain. Negative for difficulty urinating.  Musculoskeletal:  Positive for back pain.       Physical Exam   Physical Exam ED Triage Vitals [06/17/24 1905]  Temp 97.2 F (36.2 C)  Heart Rate 78  Resp 18  BP 135/61  MAP (mmHg) 61  SpO2 98 %  O2 Device None (Room air)  O2 Flow Rate (L/min)   Weight 97.5 kg (215 lb)   Physical Exam Vitals and nursing note reviewed.  Constitutional:      Appearance: Normal appearance.  HENT:     Head: Normocephalic and atraumatic.  Eyes:     Extraocular Movements: Extraocular movements intact.     Conjunctiva/sclera: Conjunctivae normal.     Pupils: Pupils are equal, round, and reactive to light.  Cardiovascular:     Rate  and Rhythm: Normal rate and regular rhythm.  Pulmonary:     Effort: Pulmonary effort is normal.  Abdominal:     Palpations: Abdomen is soft.     Tenderness: There is no abdominal tenderness. There is right CVA tenderness. There is no left CVA tenderness, guarding or rebound.  Musculoskeletal:        General: Normal range of motion.     Cervical back: Normal range of motion and neck supple. No bony tenderness.     Thoracic back: Bony tenderness present.     Lumbar back: Bony tenderness present.  Skin:    General: Skin is warm and dry.  Neurological:     General: No focal deficit present.     Mental Status: She is alert.  Psychiatric:        Mood and Affect: Mood normal.        Scoring Tools Utilized    CHA2DS2-VASc Score: N/A  Glasgow Coma Scale Score: 15                      Procedures Performed   Procedures    External Records Reviewed   PDMP reviewed, potential concern identified due to Patient is on Norco 7.5 mg #120 last filled 06/06/2024 along with lorazepam  0.5 mg #60 last filled 06/03/2024   Patient's prior hemoglobin was 12.5 on  06/04/2024.  Differential Diagnosis  Blunt traumatic injury, thoracic or lumbar spine fracture, rib fracture, kidney stone, pyelonephritis  EKG Obtained and Independently Reviewed    Labs and Imaging Independently Reviewed   Labs Reviewed  URINALYSIS WITH REFLEX TO MICROSCOPIC - Abnormal      Result Value   Color, Urine Yellow     Clarity, Urine Clear     Specific Gravity, Urine 1.015     pH, Urine 7.0     Protein, Urine Negative     Glucose, Urine 500 (*)    Ketones, Urine Negative     Bilirubin, Urine Negative     Blood, Urine Negative     Nitrite, Urine Negative     Leukocyte Esterase, Urine Negative     Urobilinogen, Urine 1.0     Narrative:    Microscopic not indicated  COMPREHENSIVE METABOLIC PANEL - Abnormal   Sodium 136     Potassium 3.8     Chloride 105     CO2 22     Anion Gap 9     Glucose, Random 210  (*)    Blood Urea Nitrogen (BUN) 14     Creatinine 0.70     eGFR >90     Albumin 3.9     Total Protein 6.6     Bilirubin, Total 0.6     Alkaline Phosphatase (ALP) 91     Aspartate Aminotransferase (AST) 40 (*)    Alanine Aminotransferase (ALT) 43     Calcium  8.5 (*)    BUN/Creatinine Ratio      CBC WITH DIFFERENTIAL - Abnormal   WBC 7.70     RBC 3.72 (*)    Hemoglobin 12.0 (*)    Hematocrit 33.9 (*)    Mean Corpuscular Volume (MCV) 91.2     Mean Corpuscular Hemoglobin (MCH) 32.4     Mean Corpuscular Hemoglobin Conc (MCHC) 35.5     Red Cell Distribution Width (RDW) 12.6     Platelet Count (PLT) 217     Mean Platelet Volume (MPV) 6.6     Neutrophils % 59     Lymphocytes % 32     Monocytes % 7     Eosinophils % 2     Basophils % 1     Neutrophils Absolute 4.50     Lymphocytes # 2.50     Monocytes # 0.50     Eosinophils # 0.10     Basophils # 0.10    LIPASE - Normal   Lipase 29      CT Spine Thoracic WO Contrast  Final Result by Deward Franky Conn, MD (09/30 2019)  CT THORACIC SPINE WITHOUT CONTRAST, 06/17/2024 7:51 PM    INDICATION: Ataxia, thoracic trauma, fall off horse \     COMPARISON: 11/13/2023    TECHNIQUE: Thin-section axial CT images of the entire thoracic spine were   acquired without contrast. Supplemental 2D reformatted images were   generated and reviewed as needed.    [facility] and its affiliates are committed to   minimizing radiation dose to patients while maintaining necessary   diagnostic image quality. All CT scans are therefore performed using As   Low As Reasonably Achievable (ALARA) protocols with either manual or   automated exposure controls calibrated to the age and size of each   patient.    LEVELS IMAGED: lower cervical to upper lumbar region.    FINDINGS:     Alignment: Normal.  Vertebrae: No acute  fractures. Vertebral body heights maintained.  Spinal canal: No significant bony stenosis.  Degenerative  changes: No significant focal.    IMPRESSION:  No evidence of acute fracture or malalignment involving the thoracic   spine.     CT Spine Lumbar WO Contrast  Final Result by Deward Franky Conn, MD (09/30 2018)  CT LUMBAR SPINE WITHOUT CONTRAST, 06/17/2024 7:51 PM    INDICATION: Back trauma, no prior imaging (Age >= 16y)     COMPARISON: 11/13/2023    TECHNIQUE: Thin-section axial CT images of the entire lumbar spine were   acquired without contrast. Supplemental 2D reformatted images were   generated and reviewed as needed.    LEVELS IMAGED: Lower thoracic to upper sacral region.    FINDINGS:    .  Soft tissues: No edema or masses.   .  Alignment: Normal.  .  Vertebrae: No acute fractures. Vertebral body heights maintained.  SABRA  Spinal canal: No significant bony stenosis.  .  Degenerative changes: No significant focal.      IMPRESSION:  No evidence of acute fracture or malalignment involving the lumbar spine.   .  .    CT Abdomen Pelvis WO Contrast  Final Result by Deward Franky Conn, MD (09/30 2016)  CT OF THE ABDOMEN AND PELVIS WITHOUT INTRAVENOUS CONTRAST, 06/17/2024 7:51   PM    INDICATION: Abdominal trauma, blunt, flank pain s/p fall off horse \     COMPARISON: Abdomen pelvis CT 05/27/2024    TECHNIQUE: Axial images of the abdomen and pelvis were obtained without   intravenous contrast. Supplemental 2D reformatted images were generated   and reviewed.    LIMITATIONS: Lack of intravenous contrast decreases sensitivity for the   detection of solid organ lesions.    FINDINGS:    LOWER CHEST  Mediastinum/hila: Within normal limits.  Heart: Normal cardiac size. No pericardial effusion.  Pleura: Within normal limits.  Lungs: No acute abnormality.    ABDOMEN  Liver: Chronic hepatomegaly with steatosis.  Gallbladder/biliary: Cholecystectomy.  Spleen: Within normal limits.  Pancreas: Within normal limits.  Adrenals: Within normal limits.  Kidneys: Within normal  limits.  Peritoneum/mesenteries: Within normal limits.  Extraperitoneum: Within normal limits.  Gastrointestinal tract: Within normal limits. Normal appendix.    PELVIS  Peritoneum: Within normal limits.  Extraperitoneum: Within normal limits.  Ureters: Within normal limits.  Bladder: Within normal limits.  Reproductive System: Hysterectomy. No acute adnexal abnormality.    MSK: No acute abnormality.    VASCULAR: Within normal limits.      IMPRESSION:  No diagnostic acute abnormality.  Chronic findings, as reported.  .  .        Patient's white count is normal with a hemoglobin of 12 and normal platelets.  CMP with a glucose of 210 and an AST of 40 and a calcium  of 8.5 but otherwise within normal limits.  Lipase is normal.  Urinalysis demonstrates glucose but no ketones without any leukocytes nitrites or blood.  CT T and L-spine were obtained along with CT abdomen pelvis without contrast.  There is no evidence of fracture or malalignment of the spine. And no other acute diagnostic abnormality on CT abdomen pelvis. MDM  Medical Decision Making Amount and/or Complexity of Data Reviewed External Data Reviewed: labs and notes. Labs: ordered. Decision-making details documented in ED Course. Radiology: ordered. Decision-making details documented in ED Course. ECG/medicine tests: ordered. Decision-making details documented in ED Course.  Risk Prescription drug management. Decision regarding hospitalization.    43 year old female  with history of chronic back pain and kidney stones who presents after falling off her horse.  Patient states that she fell onto her right side and hip.  She has had upper back and flank pain but has been ambulatory.  No head injury or loss conscious.  On arrival she is afebrile and hemodynamically stable.  Patient is not on anticoagulation despite being listed as so in the chart.  Patient's labs are reassuring.  CT of the thoracic and lumbar spine along  with CT of the abdomen pelvis without contrast were obtained based on thoracic and lumbar tenderness and CVA tenderness.  These are all reassuring without any acute diagnostic abnormality.  Patient was given Toradol  here and is on chronic pain medications with a recent prescription for Norco 7.5 mg filled on 06/06/2024 with 120 pills.  Patient can use NSAIDs and bridge with Tylenol  as long as she does not exceed her daily dose.  This plan along with follow-up with her primary care provider was discussed with the patient and she verbalized understanding.   Diagnosis and Disposition   Diagnosis: 1. Right-sided thoracic back pain, unspecified chronicity   2. Fall, initial encounter      Disposition:  Discharge   Prescriptions: ED Prescriptions     Medication Sig Dispense Start Date End Date Auth. Provider   cyclobenzaprine  (FLEXERIL ) 5 mg tablet Take 1 tablet (5 mg total) by mouth 3 (three) times a day as needed for muscle spasms. 15 tablet 06/17/2024 06/22/2024 Renu Syal, MD   ibuprofen  (MOTRIN ) 600 mg tablet Take 1 tablet (600 mg total) by mouth every 6 (six) hours as needed for moderate pain (4-6). 28 tablet 06/17/2024 06/27/2024 Carolann Seat, MD            [1] Past Medical History: Diagnosis Date  . ADHD (attention deficit hyperactivity disorder)   . Allergic   . Anxiety   . Arthritis   . Bipolar 2 disorder    (CMD)   . 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 Depression   . Diverticulosis   . Fatty liver   . GERD (gastroesophageal reflux disease)   . Headache   . Lumbar degenerative disc disease   . Lumbar spondylosis   . Obesity   . Ovarian cyst   . Renal calculus or stone   . S/P TAH (total abdominal hysterectomy)   . Snores   . Stroke    (CMD) 12/20/2023  . Substance abuse (CMD)    In remission  . Wears glasses   [2] Past Surgical History: Procedure Laterality Date  . ABDOMINAL ADHESION SURGERY N/A 02/21/2023   LYSIS OF ABDOMINAL ADHESIONS ROBOTIC XI performed by Cesar Blair Essex, DO at [facility] OR  . BRAIN SURGERY     Going to see nuero surgery on 01/15/2024  . BREAST BIOPSY Left   . CERVICAL BIOPSY  W/ LOOP ELECTRODE EXCISION    . CESAREAN SECTION     x4  . CHOLECYSTECTOMY    . CYSTOSCOPY N/A 02/21/2023   CYSTOSCOPY performed by Cesar Blair Essex, DO at [facility] OR  . CYSTOSCOPY W/ URETEROSCOPY W/ LITHOTRIPSY    . EXTRACORPOREAL SHOCK WAVE LITHOTRIPSY    . PARTIAL HYSTERECTOMY    . SALPINGOOPHORECTOMY Bilateral 02/21/2023   OOPHORECTOMY ROBOTIC XI performed by Cesar Blair Essex, DO at [facility] OR  . SPINE SURGERY     Lumbar punctured  . TOTAL ABDOMINAL HYSTERECTOMY    . TOTAL VAGINAL HYSTERECTOMY    . TUBAL LIGATION    .  WISDOM TOOTH EXTRACTION    [3] Family History Adopted: Yes  Problem Relation Name Age of Onset  . Breast cancer Mother Birth mother   . Alcohol abuse Mother Birth mother   . Depression Mother Birth mother   . Drug abuse Mother Birth mother   . Kidney disease Mother Birth mother   . Mental illness Mother Birth mother   . Colon cancer Other         biological fathers side  . Asthma Sister Biological sister   . Cancer Sister Biological sister   . Glaucoma Neg Hx    . Macular degeneration Neg Hx    . Blindness Neg Hx    [4] Social History Tobacco Use  . Smoking status: Every Day    Current packs/day: 0.00    Average packs/day: 1 pack/day for 15.0 years (15.0 ttl pk-yrs)    Types: Cigarettes    Last attempt to quit: 12/20/2023    Years since quitting: 0.4    Passive exposure: Current  . Smokeless tobacco: Never  Vaping Use  . Vaping status: Never Used  Substance Use Topics  . Alcohol use: Not Currently  . Drug use: Not Currently    Frequency: 5.0 times per week    Types: Crack cocaine, Cocaine, Hydrocodone     Comment: 15 years clean

## 2024-06-19 NOTE — ED Provider Notes (Signed)
 [facility]  ED Provider Note  Katie Spencer 43 y.o. female DOB: [birth year] MRN: [id number] History   Chief Complaint  Patient presents with  . Shoulder Pain    Right shoulder pain after being thrown off horse.Landed on shoulder on grass. Decreased grip strength, painful to move shoulder.  . Hip Pain    Right hip pain after being thrown from horse 2 days ago. Hurt again tonight when being thrown.    HPI History of Present Illness Katie Spencer is a 43 y.o. female with a medical history as listed below who presents to the emergency department for evaluation of right shoulder pain, upper back pain after being thrown off of a horse this evening.  She states the horse went out of control, then suddenly threw her over the front of the horse.  She states that she was thrown over the front of her wrist, landed on her right shoulder.  She felt that she injured her right shoulder where she has significant pain, inability to range her right shoulder.  Mostly pain to the right posterior shoulder with pain to her upper back as well.  She denies chest pain, shortness of breath, abdominal pain.  She states that she did fall from her horse about 2 days ago as well where she has right hip pain.  She denies hitting her head or loss of consciousness.  Reports some neck soreness.  Denies any numbness, weakness.     Past Medical History:  Diagnosis Date  . Cervical radiculopathy   . Chronic back pain   . Diabetes mellitus (*)   . H. pylori infection   . IIH (idiopathic intracranial hypertension)   . Kidney stone   . Migraines   . Nonpsychotic mental disorder     Past Surgical History:  Procedure Laterality Date  . Cesarean section    . Cholecystectomy    . Oophorectomy    . Partial hysterectomy      Social History   Substance and Sexual Activity  Alcohol Use Not Currently   Tobacco Use History[1] E-Cigarettes  . Vaping Use Never User   .  Start Date    . Cartridges/Day    . Quit Date     Social History   Substance and Sexual Activity  Drug Use Never         Allergies[2]  Home Medications   ABILIFY MAINTENA 400 MG INJECTION    Inject four hundred mg into the muscle every 4 (four) weeks.   ALBUTEROL  SULFATE (PROVENTIL ) 2.5 MG/3 ML NEBULIZER SOLUTION    Take 3 mLs (2.5 mg dose) by nebulization every 6 (six) hours as needed for Wheezing.   BUTALBITAL-ACETAMINOPHEN -CAFFEINE (ESGIC) 50-325-40 MG PER TABLET    Take one tablet by mouth every 6 (six) hours as needed for Pain. Max Daily Amount: 4 tablets   CHLORHEXIDINE (PERIDEX) 0.12% SOLUTION    SMARTSIG:By Mouth Twice Daily   CITALOPRAM  (CELEXA ) 40 MG TABLET    Take one tablet (40 mg dose) by mouth daily.   CYCLOBENZAPRINE  (FLEXERIL ) 10 MG TABLET    Take one tablet (10 mg dose) by mouth 2 (two) times a day as needed.   DICYCLOMINE  (BENTYL ) 20 MG TABLET    Take one tablet (20 mg dose) by mouth 2 (two) times daily.   FAMOTIDINE  (PEPCID ) 40 MG TABLET    Take one tablet (40 mg dose) by mouth daily.   GABAPENTIN  (NEURONTIN ) 600 MG TABLET    Take one  tablet (600 mg dose) by mouth 3 (three) times a day.   HYDROCODONE -ACETAMINOPHEN  (NORCO) 7.5-325 MG PER TABLET    Take one tablet by mouth 3 (three) times a day as needed for Pain.   LIDOCAINE  4 %    Follow dosing instructions on package.   LOPERAMIDE  (IMODIUM ) 2 MG CAPSULE    Take one capsule (2 mg dose) by mouth 4 (four) times a day as needed.   MELOXICAM (MOBIC) 15 MG TABLET    Take one tablet (15 mg dose) by mouth daily.   OMEPRAZOLE  (PRILOSEC) 20 MG CAPSULE    Take one capsule (20 mg dose) by mouth daily.   ONDANSETRON  (ZOFRAN -ODT) 4 MG DISINTEGRATING TABLET    Take one tablet (4 mg dose) by mouth every 8 (eight) hours as needed.   PANTOPRAZOLE  SODIUM (PROTONIX ) 40 MG TABLET    Take one tablet (40 mg dose) by mouth daily.   PROMETHAZINE  (PHENERGAN ) 25 MG TABLET    Take one tablet (25 mg dose) by mouth every 6 (six) hours as needed.    PROMETHAZINE  (PROMETHEGAN) 25 MG SUPPOSITORY    Place one suppository (25 mg dose) rectally every 8 (eight) hours as needed.   SUCRALFATE  (CARAFATE ) 1 G/10 ML ORAL SUSPENSION    Take 10 mLs (1 g dose) by mouth 3 (three) times a day for 5 days.   SUMATRIPTAN SUCCINATE (IMITREX) 100 MG TABLET    SMARTSIG:1 By Mouth Daily   TAMSULOSIN  (FLOMAX ) 0.4 MG CAPS    Take one capsule (0.4 mg dose) by mouth daily.    Primary Survey   Exposure Thoracic Spine TendernessNo Lumbar Spinal Tenderness  No visible chest trauma.  No visible abdominal trauma.       Review of Systems   Review of Systems  Constitutional:  Negative for chills and fever.  HENT:  Negative for ear pain and sore throat.   Eyes:  Negative for pain and visual disturbance.  Respiratory:  Negative for cough and shortness of breath.   Cardiovascular:  Negative for chest pain and palpitations.  Gastrointestinal:  Negative for abdominal pain and vomiting.  Genitourinary:  Negative for dysuria and hematuria.  Musculoskeletal:  Positive for back pain and neck stiffness. Negative for arthralgias.       Right shoulder pain Right hip pain  Skin:  Negative for color change and rash.  Neurological:  Negative for seizures and syncope.  All other systems reviewed and are negative.   Physical Exam   ED Triage Vitals [06/19/24 1957]  BP 145/88  Heart Rate 90  Resp 17  SpO2 98 %  Temp 99 F (37.2 C)    Physical Exam  Nursing note and vitals reviewed. Constitutional: She appears well-developed and well-nourished. She appears to be in pain, does not appear distressed, does not appear ill and no respiratory distress. Not diaphoretic. Uncomfortable appearing, no respiratory distress   HENT:  Head: Normocephalic and atraumatic.  Right Ear: Normal external ear. Normal ear canal.  Left Ear: Normal external ear. Normal ear canal.  Nose: Nose normal.  Mouth/Throat: Voice normal.  Eyes: Conjunctivae are normal. Pupils are equal,  round, and reactive to light. Right eye: no conjunctival injection. Left eye: no conjunctival injection. No scleral icterus.  Neck: Normal range of motion and voice normal. Neck supple. Muscular tenderness. No palpable c-spine step off and no spinous process tenderness. Normal range of motion.  Cardiovascular: Normal rate, regular rhythm, normal heart sounds and intact distal pulses.  Pulmonary/Chest: No respiratory distress. Respiratory  effort normal and breath sounds normal. No visible chest trauma.  Lungs are clear to auscultation bilaterally   Abdominal: Soft. There is no abdominal tenderness. Abdomen not distended. No visible abdominal trauma.  Abdomen is soft, non distended, non tender. No guarding or rebound   Musculoskeletal: T spine tenderness and no L spine tenderness. No obvious deformity noted to extremities.     Cervical back: Normal range of motion and neck supple. Muscular tenderness present. No spinous process tenderness. Normal range of motion.     Comments: She has midline thoracic spine tenderness to palpation.  No step-off, deformity, swelling or overlying skin changes.  Lumbar spine and cervical spine without midline tenderness.  She does have some paracervical spine tenderness.  She has right significant posterior shoulder tenderness, without obvious deformity, swelling or overlying skin changes.  She has decreased range of motion of her right shoulder secondary to pain.  Right elbow, humerus, forearm, wrist, hand are without obvious trauma.  She has full painless range of motion.  Radial pulses 2+ bilaterally.  Sensation to light touch intact throughout her bilateral upper extremities no edema.  Neurological: She is alert and oriented to person, place, and time. Gait normal. She has normal speech. Cranial nerves intact II through XII. Finger to nose intact bilaterally. Sensation intact to light touch, bilateral upper and lower extremities. Heel to shin intact bilaterally. Strength  5/5 bilateral upper and lower extremities.  Mental status: Awake, alert, oriented x3 Speech: Fluent Motor: Unable to test right upper extremity.  Otherwise 5 out of 5 strength of her left upper extremity and bilateral lower extremities Sensation: Intact to light touch Gait: Intact Cranial nerves: 2-12  intact Cerebellar: Finger to nose and heel to shin intact; no ataxia    Skin: Skin is warm. Not diaphoretic. Skin is dry.  Psychiatric: She has a normal mood and affect. Her behavior is normal.   Physical Exam   ED Course   Lab results: No data to display  Imaging:   XR SHOULDER 2+ VIEWS RIGHT   Narrative:    INDICATION: Other closed extremity injury with pain and/or deformity.   TECHNIQUE:  XR SHOULDER 2+ VIEWS RIGHT 06/19/2024 8:25 PM  COMPARISON:  None.      FINDINGS: - No acute fractures.  - No destructive osseous lesions.  - Joint spaces are preserved.  - No focal soft tissue abnormalities.  - Misc: N/A    Impression:    IMPRESSION:  No acute osseous findings.   Electronically Signed by: Dorn Burkes on 06/19/2024 8:41 PM  CT CHEST WO CONTRAST   Narrative:    EXAM: CT CHEST WO CONTRAST  HISTORY: Other trauma, thrown off horse  TECHNIQUE: Contiguous axial images were obtained utilizing a multislice, multidetector CT scanner. Post-processed reformations were also submitted for review.   COMPARISON: None available.  FINDINGS:  Images limited by motion artifact. There is artifact noted across the mid sternum. Lower cervical soft tissues:  Normal Heart : Normal size. RV/LV ratio < 0.9. No pericardial effusion. Aorta/pulmonary arteries:  Normal aorta. No pulmonary emboli. Mediastinal and hilar structures:  No mass or adenopathy. Lungs:  Normal. Osseous:  No acute findings.  Upper abdomen : No acute findings. Fatty infiltration of the liver.    Impression:    IMPRESSION:  No acute findings. Images limited by motion artifact.  Electronically Signed by:  Dorn Burkes on 06/19/2024 9:56 PM  CT SPINE THORACIC RECONSTRUCTIONS   Narrative:    DATE: 06/19/2024 9:43 PM  EXAMINATION:  CT thoracic spine without contrast  INDICATION:  trauma  COMPARISON: None available.  TECHNIQUE: Sequential thin collimation CT slices through the thoracic spine were obtained without contrast. Sagittal and coronal reformatted images were provided.  Automatic exposure control (AEC) was utilized.  FINDINGS:  Acute fracture: None.  Alignment: The thoracic alignment is normal.  Disc spaces: Early disc degeneration across the mid thoracic levels at T6-T7, T7-T8, T8-T9, T9-T10.  Visualized central canal: No evidence of high grade central canal narrowing.  Soft Tissues: The paravertebral and paraspinal soft tissues are unremarkable.   The visualized aspects of the lungs, heart, vascular structures are within normal limits.  No lymphadenopathy.   Vessels: No atherosclerotic calcifications.     Impression:    IMPRESSION:   No acute fracture of the thoracic spine. Mild degenerative changes involving the discs.  Electronically Signed by: Dorn Burkes on 06/19/2024 9:58 PM  CT SPINE CERVICAL WO CONTRAST   Narrative:    INDICATION: fall, neck pain COMPARISON:  None.   TECHNIQUE: Routine noncontrast CT cervical spine was performed. Coronal and sagittal reformatted images were obtained and reviewed. Radiation dose reduction was utilized (automated exposure control, mA or kV adjustment based on patient size, or iterative image reconstruction).  FINDINGS:  OSSEOUS STRUCTURES: No acute fracture or dislocation. Vertebral body heights are preserved.  Cervical spinal alignment is preserved. No destructive osseous lesions.  Mild to moderate disc narrowing at C5-C6 with spurring. Mild bilateral facet degeneration across C3-C4, C4-C5, C5-C6, LEFT greater than RIGHT.  SOFT TISSUES: No perivertebral soft tissue abnormalities.  ADDITIONAL/INCIDENTAL:  N./A.     Impression:    IMPRESSION:  1.  No acute findings.     2.  Mid cervical degenerative changes.  Electronically Signed by: Dorn Burkes on 06/19/2024 10:00 PM  XR PELVIS AND RIGHT  HIP   Narrative:    INDICATION: Fall   TECHNIQUE:  XR PELVIS AND RIGHT  HIP  FINDINGS:  XR BONE No acute fracture or traumatic malalignment identified. Intact joint spaces. Unremarkable soft tissues. No radiopaque foreign body identified.    Impression:    IMPRESSION:  XR BONE No acute fracture or traumatic malalignment identified.  Electronically Signed by: Charlie Patch, MD on 06/19/2024 10:54 PM     ECG: ECG Results   None                                                                        Pre-Sedation Procedures    Medical Decision Making I reviewed diagnosis with the patient. All questions answered. Patient is comfortable with the plan to go home and follow up as an outpatient.  Patient remains hemodynamically stable and ready for discharge.  Strict return precautions were discussed and given in writing.   Amount and/or Complexity of Data Reviewed Radiology: ordered.  Risk Prescription drug management.   Results   Medical Decision Making      Provider Communication  New Prescriptions   No medications on file    Modified Medications   No medications on file    Discontinued Medications   No medications on file    Clinical Impression Final diagnoses:  Fall from horse, initial encounter  Injury of right shoulder, initial encounter  Acute midline thoracic back pain  ED Disposition     ED Disposition  Discharge   Condition  Stable   Comment  --                 Follow-up Information     Schedule an appointment as soon as possible for a visit  with Ozell JINNY Lenis, MD.   Specialty: Family Medicine Contact information: [address] [place] [address] [phone]          Schedule an appointment as soon as possible for a visit  with [facility].   Contact information: [address] [place]  [address] [phone]                 Electronically signed by:       [1] Social History Tobacco Use  Smoking Status Every Day  . Current packs/day: 1.00  . Average packs/day: 1 pack/day for 5.0 years (5.0 ttl pk-yrs)  . Types: Cigarettes  Smokeless Tobacco Never  [2] Allergies Allergen Reactions  . Almond Hives, Itching and Rash  . Morphine  Hives, Rash and Swelling    Other reaction(s): Swelling Other reaction(s): swelling Hives/swelling/itching of arms  Hives/swelling/itching of arms     Other reaction(s): Swelling, Other reaction(s): swelling, Hives/swelling/itching of arms , Hives/swelling/itching of arms  tolerates hydrocodone   . Almond Oil Unknown  . Diphenhydramine  Other    Drowsy   . Morphine  And Related Swelling    Hives/swelling/itching of arms   . Triptans Other and Unknown    Contraindicated d/t stroke like symptoms.   Contraindicated d/t stroke like symptoms.   Waddell JINNY Day, DO 06/19/24 [id number]

## 2024-06-19 NOTE — ED Notes (Signed)
 Pt was discharged on stable condition.

## 2024-06-19 NOTE — ED Notes (Signed)
 Pt was taken for xray.

## 2024-06-19 NOTE — ED Notes (Signed)
Pt was provided with warm blanket

## 2024-06-22 ENCOUNTER — Other Ambulatory Visit: Payer: Self-pay

## 2024-06-22 ENCOUNTER — Encounter (HOSPITAL_BASED_OUTPATIENT_CLINIC_OR_DEPARTMENT_OTHER): Payer: Self-pay | Admitting: *Deleted

## 2024-06-22 ENCOUNTER — Emergency Department (HOSPITAL_BASED_OUTPATIENT_CLINIC_OR_DEPARTMENT_OTHER)
Admission: EM | Admit: 2024-06-22 | Discharge: 2024-06-22 | Discharge status: Against Medical Advice | Payer: MEDICAID | Attending: Emergency Medicine | Admitting: Emergency Medicine

## 2024-06-22 DIAGNOSIS — R202 Paresthesia of skin: Secondary | ICD-10-CM | POA: Insufficient documentation

## 2024-06-22 DIAGNOSIS — M545 Low back pain, unspecified: Secondary | ICD-10-CM | POA: Diagnosis present

## 2024-06-22 DIAGNOSIS — Z7982 Long term (current) use of aspirin: Secondary | ICD-10-CM | POA: Insufficient documentation

## 2024-06-22 DIAGNOSIS — Y9339 Activity, other involving climbing, rappelling and jumping off: Secondary | ICD-10-CM | POA: Diagnosis not present

## 2024-06-22 DIAGNOSIS — X501XXA Overexertion from prolonged static or awkward postures, initial encounter: Secondary | ICD-10-CM | POA: Diagnosis not present

## 2024-06-22 DIAGNOSIS — R531 Weakness: Secondary | ICD-10-CM | POA: Diagnosis not present

## 2024-06-22 HISTORY — DX: Type 2 diabetes mellitus without complications: E11.9

## 2024-06-22 NOTE — ED Notes (Signed)
 Pt notes she does not like it here. Pt opens door forcefully and exits the room.  Pt unable to be stopped and exited the ED.  EDP is aware of same

## 2024-06-22 NOTE — ED Notes (Signed)
 EDP to bedside to assess pt.  Pt does not present helpful with same.

## 2024-06-22 NOTE — ED Triage Notes (Signed)
 Patient present with Rt hand and rt leg tingling and numbness. Weak grips in rt hand 3/5 Left hand 5/5. Rt leg 3/5 and Left leg 5/5 Neuro changes started 40 min ago. Denies Chest Pain or shortness of breath, headache Hx: TIA in April 2025.

## 2024-06-22 NOTE — ED Provider Notes (Signed)
 [place] [facility]MENT AT [facility] Provider Note   CSN: [id number] Arrival date & time: 06/22/24  9956     Patient presents with: Numbness (Right hand/right leg )   Katie Spencer is a 43 y.o. female.   Patient is a 43 year old female presenting with complaints of right arm and right leg weakness and numbness.  She states that she was getting into bed this evening when she felt a pop in her low back followed by the weakness/numbness as described above.  She is describing back pain.  No urinary complaints.  Patient arrived here by private auto and took significant assistance from wheelchair to exam stretcher.  I just saw this same patient approximately 2 weeks ago after she presented with urinary incontinence and reported pins-and-needles sensation to both lower legs after she was struck in the low back with a softball.  She had been sent to [facility] for MRI which was normal and showed no explanation for her paresthesias or urinary issues.  Also of note is that this is the patient's 42nd visit to the ER during 2025 in records that are available to us .       Prior to Admission medications   Medication Sig Start Date End Date Taking? Authorizing Provider  ABILIFY MAINTENA 400 MG PRSY prefilled syringe Inject 400 mg into the muscle every 21 ( twenty-one) days. 09/27/23   [provider]  acetaZOLAMIDE (DIAMOX) 250 MG tablet Take 250 mg by mouth.    [provider]  albuterol  (PROVENTIL ) (2.5 MG/3ML) 0.083% nebulizer solution Take 2.5 mg by nebulization as needed for wheezing or shortness of breath. 12/30/22   [provider]  aspirin  EC 81 MG tablet Take 1 tablet (81 mg total) by mouth daily. Swallow whole. 12/23/23   Cherlyn Labella, MD  atorvastatin  (LIPITOR) 40 MG tablet Take 1 tablet (40 mg total) by mouth daily. 12/22/23 12/21/24  Akula, Vijaya, MD  butalbital-acetaminophen -caffeine (FIORICET) 50-325-40 MG tablet Take 1 tablet by mouth  every 6 (six) hours as needed. 01/16/24 01/15/25  [provider]  ciprofloxacin  (CIPRO ) 500 MG tablet Take 1 tablet (500 mg total) by mouth 2 (two) times daily. One po bid x 7 days 03/23/24   Geroldine Berg, MD  citalopram  (CELEXA ) 40 MG tablet Take 40 mg by mouth daily. 01/01/20   [provider]  clopidogrel (PLAVIX) 75 MG tablet Take 1 tablet by mouth daily. 01/23/24   [provider]  cyclobenzaprine  (FLEXERIL ) 10 MG tablet Take 10 mg by mouth 3 (three) times daily. 10/19/23   [provider]  dicyclomine  (BENTYL ) 20 MG tablet Take 1 tablet (20 mg total) by mouth 2 (two) times daily. Patient taking differently: Take 20 mg by mouth as needed for spasms. 03/10/23   Sandie Signe BROCKS, MD  dicyclomine  (BENTYL ) 20 MG tablet Take 1 tablet (20 mg total) by mouth 2 (two) times daily. 05/21/24   Palumbo, April, MD  famotidine  (PEPCID ) 40 MG tablet Take 40 mg by mouth as needed for heartburn or indigestion. 11/17/21   [provider]  fluticasone (FLONASE) 50 MCG/ACT nasal spray Place 2 sprays into both nostrils as needed for allergies. 12/02/20   [provider]  gabapentin  (NEURONTIN ) 600 MG tablet Take 600 mg by mouth 3 (three) times daily. 05/19/22   [provider]  HYDROcodone -acetaminophen  (NORCO) 7.5-325 MG tablet Take 1 tablet by mouth 4 (four) times daily as needed for moderate pain (pain score 4-6). 11/22/23   [provider]  ibuprofen  (ADVIL ) 600 MG tablet Take 600 mg by mouth 3 (three) times daily as needed for mild pain (pain score 1-3). 09/21/23   [provider]  LORazepam  (ATIVAN ) 0.5 MG tablet TAKE 1 TABLET BY MOUTH TWICE DAILY AS NEEDED FOR SEVERE ANXIETY AND/OR PANIC ATTACKS 01/17/24   [provider]  metFORMIN  (GLUCOPHAGE ) 500 MG tablet Take 1 tablet (500 mg total) by mouth 2 (two) times daily with a meal. 05/21/24   Palumbo, April, MD  metroNIDAZOLE  (FLAGYL ) 500 MG tablet Take 1 tablet (500 mg total) by mouth 3  (three) times daily. 03/23/24   Geroldine Berg, MD  nicotine  (NICODERM CQ  - DOSED IN MG/24 HOURS) 21 mg/24hr patch Place 1 patch (21 mg total) onto the skin daily. 12/23/23   Cherlyn Labella, MD  omeprazole  (PRILOSEC) 40 MG capsule Take 1 capsule (40 mg total) by mouth daily as needed (indigestion). 03/25/23   Beverley Leita LABOR, PA-C  ondansetron  (ZOFRAN -ODT) 4 MG disintegrating tablet SMARTSIG:1 Tablet(s) By Mouth Every 12 Hours PRN    [provider]  oxyCODONE -acetaminophen  (PERCOCET/ROXICET) 5-325 MG tablet Take 1 tablet by mouth every 6 (six) hours as needed. 01/25/24   [provider]  potassium chloride  (KLOR-CON ) 10 MEQ tablet Take 1 tablet (10 mEq total) by mouth daily. 02/11/24   Beverley Leita LABOR, PA-C  sodium bicarbonate 650 MG tablet  01/14/24   [provider]  SUMAtriptan (IMITREX) 100 MG tablet Take 100 mg by mouth every 2 (two) hours as needed for migraine. 02/09/22   [provider]  tamsulosin  (FLOMAX ) 0.4 MG CAPS capsule Take 1 capsule by mouth as needed (bladder). 05/26/21   [provider]  belladonna-opium  (B&O SUPPRETTES) 16.2-30 MG suppository Place 1 suppository rectally every 8 (eight) hours as needed for pain. Patient not taking: Reported on 05/27/2020 05/20/20 08/18/20  Haze Lonni PARAS, MD  lansoprazole  (PREVACID ) 30 MG capsule Take 1 capsule daily while taking ibuprofen . Patient not taking: Reported on 05/27/2020 04/16/20 08/18/20  Molpus, Norleen, MD    Allergies: Almond (diagnostic), Diphenhydramine , Morphine  and codeine, Nsaids, and Triptans    Review of Systems  All other systems reviewed and are negative.   Updated Vital Signs BP (!) 137/93 (BP Location: Right Arm)   Pulse (!) 105   Temp 98.7 F (37.1 C) (Oral)   Resp 18   Ht 5' 4 (1.626 m)   Wt 97.5 kg   LMP 04/04/2020 Comment: neg preg test  SpO2 99%   BMI 36.90 kg/m   Physical Exam Vitals and nursing note reviewed.  Constitutional:      General: She is not in acute  distress.    Appearance: Normal appearance. She is well-developed. She is not diaphoretic.  HENT:     Head: Normocephalic and atraumatic.  Cardiovascular:     Rate and Rhythm: Normal rate and regular rhythm.     Heart sounds: No murmur heard.    No friction rub. No gallop.  Pulmonary:     Effort: Pulmonary effort is normal. No respiratory distress.     Breath sounds: Normal breath sounds. No wheezing.  Abdominal:     General: Bowel sounds are normal. There is no distension.     Palpations: Abdomen is soft.     Tenderness: There is no abdominal tenderness.  Musculoskeletal:        General: Normal range of motion.     Cervical back: Normal range of motion and neck supple.  Skin:    General: Skin is warm and  dry.  Neurological:     General: No focal deficit present.     Mental Status: She is alert and oriented to person, place, and time.     Comments: Strength is 4+ out of 5 in the right upper and right lower extremity.  Strength is 5 out of 5 in the left upper and left lower extremity.     (all labs ordered are listed, but only abnormal results are displayed) Labs Reviewed - No data to display  EKG: EKG Interpretation Date/Time:  Sunday June 22 2024 00:56:16 EDT Ventricular Rate:  104 PR Interval:  127 QRS Duration:  93 QT Interval:  344 QTC Calculation: 453 R Axis:   61  Text Interpretation: Sinus tachycardia Abnormal inferior Q waves No significant change since 05/20/2024 Confirmed by Geroldine Berg ([id number]) on 06/22/2024 12:59:15 AM  Radiology: No results found.   Procedures   Medications Ordered in the ED - No data to display                                  Medical Decision Making  Patient presenting here with complaints of weakness and numbness to her right arm and leg after feeling a pop in her low back while climbing into bed.  Patient arrives here with stable vital signs and is afebrile.  Physical examination reveals inconsistent weakness of the right upper  and right lower extremity, but is otherwise unremarkable.  After performing my assessment, I had an initial discussion with this patient during which I confronted her on the frequency and nature of her emergency room visits.  I also informed her that the pop she describes followed by pain in the lumbar region would not explain the symptoms she describes in her right arm.  I then told her I was going to review her record further, then come up with a treatment plan.  I then left the exam room and perused her record.  Apparently, she did not appreciate my comments. When I returned to the room, she informed me she did not want me taking care of her and that she was going somewhere else.  I informed her that I had planned to obtain CT scans of her head, cervical spine, and lumbar spine, but she has refused.  She then eloped from the emergency department.  Interestingly, she was able to ambulate from the emergency department with a slight limp, but otherwise without difficulty.  She did not display the same difficulty bearing weight on her way out as she did when being triaged when she required maximum assistance getting into the exam stretcher..  I have reviewed her record and see many, many visits involving similar presentations dating back many years.  The vast majority of these visits resulted in no definitive diagnosis.  She has a history of chronic pain, drug abuse documented in another physician's note, and is on chronic opioids.  Perhaps there is a component of malingering to these visits.     Final diagnoses:  None    ED Discharge Orders     None          Geroldine Berg, MD 06/22/24 [phone]

## 2024-06-22 NOTE — ED Notes (Signed)
 Pt has steady gait, and independent towards the lobby.  She appears to have no deficits as she is using her right arm to talk on phone and carry items as she exited.
